# Patient Record
Sex: Female | Born: 1935 | Hispanic: No | Marital: Married | Smoking: Never smoker
Health system: Southern US, Community
[De-identification: ages and names within clinical notes are randomized; demographics above are authoritative.]

## PROBLEM LIST (undated history)

## (undated) DIAGNOSIS — F419 Anxiety disorder, unspecified: Secondary | ICD-10-CM

## (undated) DIAGNOSIS — E785 Hyperlipidemia, unspecified: Secondary | ICD-10-CM

## (undated) DIAGNOSIS — I2510 Atherosclerotic heart disease of native coronary artery without angina pectoris: Secondary | ICD-10-CM

## (undated) DIAGNOSIS — R9389 Abnormal findings on diagnostic imaging of other specified body structures: Secondary | ICD-10-CM

## (undated) DIAGNOSIS — E119 Type 2 diabetes mellitus without complications: Secondary | ICD-10-CM

## (undated) DIAGNOSIS — I10 Essential (primary) hypertension: Secondary | ICD-10-CM

## (undated) DIAGNOSIS — E039 Hypothyroidism, unspecified: Secondary | ICD-10-CM

## (undated) DIAGNOSIS — T783XXA Angioneurotic edema, initial encounter: Secondary | ICD-10-CM

---

## 1981-07-23 DIAGNOSIS — I10 Essential (primary) hypertension: Secondary | ICD-10-CM | POA: Insufficient documentation

## 1997-06-04 DIAGNOSIS — E119 Type 2 diabetes mellitus without complications: Secondary | ICD-10-CM | POA: Insufficient documentation

## 1998-03-09 ENCOUNTER — Ambulatory Visit (HOSPITAL_COMMUNITY): Admission: RE | Admit: 1998-03-09 | Discharge: 1998-03-09 | Payer: Self-pay | Admitting: Family Medicine

## 1999-01-02 ENCOUNTER — Other Ambulatory Visit: Admission: RE | Admit: 1999-01-02 | Discharge: 1999-01-02 | Payer: Self-pay | Admitting: Family Medicine

## 1999-01-17 ENCOUNTER — Ambulatory Visit (HOSPITAL_COMMUNITY): Admission: RE | Admit: 1999-01-17 | Discharge: 1999-01-17 | Payer: Self-pay | Admitting: Family Medicine

## 1999-01-17 ENCOUNTER — Encounter: Payer: Self-pay | Admitting: Family Medicine

## 1999-04-21 ENCOUNTER — Ambulatory Visit (HOSPITAL_COMMUNITY): Admission: RE | Admit: 1999-04-21 | Discharge: 1999-04-21 | Payer: Self-pay | Admitting: Family Medicine

## 1999-05-03 ENCOUNTER — Encounter: Payer: Self-pay | Admitting: Family Medicine

## 1999-05-03 ENCOUNTER — Ambulatory Visit (HOSPITAL_COMMUNITY): Admission: RE | Admit: 1999-05-03 | Discharge: 1999-05-03 | Payer: Self-pay | Admitting: Family Medicine

## 1999-07-19 ENCOUNTER — Ambulatory Visit (HOSPITAL_COMMUNITY): Admission: RE | Admit: 1999-07-19 | Discharge: 1999-07-19 | Payer: Self-pay | Admitting: Family Medicine

## 1999-07-19 ENCOUNTER — Encounter: Payer: Self-pay | Admitting: Family Medicine

## 2002-07-28 ENCOUNTER — Encounter: Admission: RE | Admit: 2002-07-28 | Discharge: 2002-10-26 | Payer: Self-pay | Admitting: Family Medicine

## 2003-08-26 ENCOUNTER — Ambulatory Visit (HOSPITAL_COMMUNITY): Admission: RE | Admit: 2003-08-26 | Discharge: 2003-08-26 | Payer: Self-pay | Admitting: Family Medicine

## 2004-11-15 ENCOUNTER — Ambulatory Visit: Payer: Self-pay | Admitting: Family Medicine

## 2004-12-19 ENCOUNTER — Ambulatory Visit: Payer: Self-pay | Admitting: Family Medicine

## 2004-12-27 ENCOUNTER — Ambulatory Visit (HOSPITAL_COMMUNITY): Admission: RE | Admit: 2004-12-27 | Discharge: 2004-12-27 | Payer: Self-pay | Admitting: Family Medicine

## 2005-01-31 ENCOUNTER — Ambulatory Visit: Payer: Self-pay | Admitting: Family Medicine

## 2005-01-31 ENCOUNTER — Other Ambulatory Visit: Admission: RE | Admit: 2005-01-31 | Discharge: 2005-01-31 | Payer: Self-pay | Admitting: Family Medicine

## 2005-04-10 ENCOUNTER — Ambulatory Visit: Payer: Self-pay | Admitting: Family Medicine

## 2005-05-28 ENCOUNTER — Ambulatory Visit: Payer: Self-pay | Admitting: Family Medicine

## 2006-04-30 ENCOUNTER — Ambulatory Visit: Payer: Self-pay | Admitting: Family Medicine

## 2006-05-15 ENCOUNTER — Ambulatory Visit (HOSPITAL_COMMUNITY): Admission: RE | Admit: 2006-05-15 | Discharge: 2006-05-15 | Payer: Self-pay | Admitting: Internal Medicine

## 2006-05-31 ENCOUNTER — Encounter: Admission: RE | Admit: 2006-05-31 | Discharge: 2006-05-31 | Payer: Self-pay | Admitting: Family Medicine

## 2006-08-05 ENCOUNTER — Ambulatory Visit: Payer: Self-pay | Admitting: Family Medicine

## 2006-08-05 DIAGNOSIS — E559 Vitamin D deficiency, unspecified: Secondary | ICD-10-CM | POA: Insufficient documentation

## 2006-10-15 ENCOUNTER — Ambulatory Visit: Payer: Self-pay | Admitting: Family Medicine

## 2006-11-13 ENCOUNTER — Ambulatory Visit: Payer: Self-pay | Admitting: Family Medicine

## 2007-02-26 ENCOUNTER — Ambulatory Visit: Payer: Self-pay | Admitting: Family Medicine

## 2007-02-27 ENCOUNTER — Encounter (INDEPENDENT_AMBULATORY_CARE_PROVIDER_SITE_OTHER): Payer: Self-pay | Admitting: Family Medicine

## 2007-02-27 LAB — CONVERTED CEMR LAB
LDL Cholesterol: 99 mg/dL (ref 0–99)
Total CHOL/HDL Ratio: 4.7
VLDL: 38 mg/dL (ref 0–40)

## 2007-03-03 DIAGNOSIS — F411 Generalized anxiety disorder: Secondary | ICD-10-CM | POA: Insufficient documentation

## 2007-03-03 DIAGNOSIS — M1990 Unspecified osteoarthritis, unspecified site: Secondary | ICD-10-CM | POA: Insufficient documentation

## 2007-03-03 DIAGNOSIS — E785 Hyperlipidemia, unspecified: Secondary | ICD-10-CM | POA: Insufficient documentation

## 2007-03-03 DIAGNOSIS — G4700 Insomnia, unspecified: Secondary | ICD-10-CM

## 2007-06-04 ENCOUNTER — Ambulatory Visit: Payer: Self-pay | Admitting: Family Medicine

## 2007-06-04 LAB — CONVERTED CEMR LAB: Hgb A1c MFr Bld: 7.7 %

## 2007-06-11 LAB — CONVERTED CEMR LAB
ALT: 14 units/L (ref 0–35)
AST: 18 units/L (ref 0–37)
Albumin: 3.9 g/dL (ref 3.5–5.2)
Alkaline Phosphatase: 67 units/L (ref 39–117)
BUN: 29 mg/dL — ABNORMAL HIGH (ref 6–23)
CO2: 25 meq/L (ref 19–32)
Chloride: 105 meq/L (ref 96–112)
Cholesterol: 144 mg/dL (ref 0–200)
Creatinine, Ser: 1.07 mg/dL (ref 0.40–1.20)
Glucose, Bld: 154 mg/dL — ABNORMAL HIGH (ref 70–99)
Total Bilirubin: 0.4 mg/dL (ref 0.3–1.2)
Total Protein: 7.5 g/dL (ref 6.0–8.3)
VLDL: 38 mg/dL (ref 0–40)

## 2007-06-24 ENCOUNTER — Ambulatory Visit (HOSPITAL_COMMUNITY): Admission: RE | Admit: 2007-06-24 | Discharge: 2007-06-24 | Payer: Self-pay | Admitting: Family Medicine

## 2007-11-20 ENCOUNTER — Telehealth (INDEPENDENT_AMBULATORY_CARE_PROVIDER_SITE_OTHER): Payer: Self-pay | Admitting: Family Medicine

## 2007-12-30 ENCOUNTER — Other Ambulatory Visit: Admission: RE | Admit: 2007-12-30 | Discharge: 2007-12-30 | Payer: Self-pay | Admitting: Family Medicine

## 2007-12-30 ENCOUNTER — Ambulatory Visit: Payer: Self-pay | Admitting: Family Medicine

## 2007-12-30 ENCOUNTER — Encounter (INDEPENDENT_AMBULATORY_CARE_PROVIDER_SITE_OTHER): Payer: Self-pay | Admitting: Family Medicine

## 2007-12-30 LAB — CONVERTED CEMR LAB
Bilirubin Urine: NEGATIVE
Blood in Urine, dipstick: NEGATIVE
Hgb A1c MFr Bld: 7.7 %
Ketones, urine, test strip: NEGATIVE
Nitrite: NEGATIVE
Protein, U semiquant: NEGATIVE
Urobilinogen, UA: 0.2
pH: 5

## 2007-12-31 LAB — CONVERTED CEMR LAB: Pap Smear: NORMAL

## 2008-01-05 DIAGNOSIS — E039 Hypothyroidism, unspecified: Secondary | ICD-10-CM | POA: Insufficient documentation

## 2008-01-06 ENCOUNTER — Encounter (INDEPENDENT_AMBULATORY_CARE_PROVIDER_SITE_OTHER): Payer: Self-pay | Admitting: Family Medicine

## 2008-02-12 ENCOUNTER — Encounter (INDEPENDENT_AMBULATORY_CARE_PROVIDER_SITE_OTHER): Payer: Self-pay | Admitting: *Deleted

## 2008-03-04 ENCOUNTER — Encounter (INDEPENDENT_AMBULATORY_CARE_PROVIDER_SITE_OTHER): Payer: Self-pay | Admitting: *Deleted

## 2008-03-04 LAB — CONVERTED CEMR LAB
Albumin: 3.9 g/dL (ref 3.5–5.2)
BUN: 28 mg/dL — ABNORMAL HIGH (ref 6–23)
Basophils Absolute: 0 10*3/uL (ref 0.0–0.1)
Basophils Relative: 0 % (ref 0–1)
CO2: 24 meq/L (ref 19–32)
Calcium: 10 mg/dL (ref 8.4–10.5)
Cholesterol: 123 mg/dL (ref 0–200)
Eosinophils Absolute: 0.1 10*3/uL (ref 0.0–0.7)
Eosinophils Relative: 1 % (ref 0–5)
HCT: 39.4 % (ref 36.0–46.0)
LDL Cholesterol: 49 mg/dL (ref 0–99)
Lymphocytes Relative: 31 % (ref 12–46)
Lymphs Abs: 2.8 10*3/uL (ref 0.7–4.0)
Microalb, Ur: 0.57 mg/dL (ref 0.00–1.89)
Monocytes Absolute: 0.8 10*3/uL (ref 0.1–1.0)
Monocytes Relative: 8 % (ref 3–12)
Potassium: 4.4 meq/L (ref 3.5–5.3)
RDW: 13.8 % (ref 11.5–15.5)
TSH: 2.837 microintl units/mL (ref 0.350–5.50)
Total Bilirubin: 0.4 mg/dL (ref 0.3–1.2)
Total Protein: 7.2 g/dL (ref 6.0–8.3)
Triglycerides: 193 mg/dL — ABNORMAL HIGH (ref <150)
VLDL: 39 mg/dL (ref 0–40)
WBC: 9.1 10*3/uL (ref 4.0–10.5)

## 2008-06-24 ENCOUNTER — Ambulatory Visit (HOSPITAL_COMMUNITY): Admission: RE | Admit: 2008-06-24 | Discharge: 2008-06-24 | Payer: Self-pay | Admitting: Family Medicine

## 2008-10-13 ENCOUNTER — Telehealth (INDEPENDENT_AMBULATORY_CARE_PROVIDER_SITE_OTHER): Payer: Self-pay | Admitting: *Deleted

## 2008-10-20 ENCOUNTER — Ambulatory Visit: Payer: Self-pay | Admitting: Family Medicine

## 2008-11-05 ENCOUNTER — Telehealth (INDEPENDENT_AMBULATORY_CARE_PROVIDER_SITE_OTHER): Payer: Self-pay | Admitting: Family Medicine

## 2008-11-05 LAB — CONVERTED CEMR LAB
AST: 17 units/L (ref 0–37)
Alkaline Phosphatase: 57 units/L (ref 39–117)
Calcium: 9.2 mg/dL (ref 8.4–10.5)
Glucose, Bld: 134 mg/dL — ABNORMAL HIGH (ref 70–99)
HDL: 35 mg/dL — ABNORMAL LOW (ref >39)
Hgb A1c MFr Bld: 7.4 % — ABNORMAL HIGH (ref 4.6–6.1)
LDL Cholesterol: 48 mg/dL (ref 0–99)
Sodium: 140 meq/L (ref 135–145)
TSH: 6.1 microintl units/mL — ABNORMAL HIGH (ref 0.350–4.500)
Total Protein: 7.2 g/dL (ref 6.0–8.3)
Triglycerides: 179 mg/dL — ABNORMAL HIGH (ref <150)
VLDL: 36 mg/dL (ref 0–40)

## 2008-11-15 ENCOUNTER — Ambulatory Visit: Payer: Self-pay | Admitting: Family Medicine

## 2009-02-11 ENCOUNTER — Ambulatory Visit: Payer: Self-pay | Admitting: Internal Medicine

## 2009-05-19 ENCOUNTER — Encounter: Payer: Self-pay | Admitting: Physician Assistant

## 2009-06-08 ENCOUNTER — Telehealth (INDEPENDENT_AMBULATORY_CARE_PROVIDER_SITE_OTHER): Payer: Self-pay | Admitting: Nurse Practitioner

## 2009-06-13 ENCOUNTER — Ambulatory Visit: Payer: Self-pay | Admitting: Physician Assistant

## 2009-06-13 LAB — CONVERTED CEMR LAB: LDL Goal: 100 mg/dL

## 2009-06-14 ENCOUNTER — Telehealth: Payer: Self-pay | Admitting: Physician Assistant

## 2009-06-14 LAB — CONVERTED CEMR LAB
AST: 18 units/L (ref 0–37)
Alkaline Phosphatase: 61 units/L (ref 39–117)
BUN: 32 mg/dL — ABNORMAL HIGH (ref 6–23)
Chloride: 104 meq/L (ref 96–112)
Creatinine, Ser: 1.08 mg/dL (ref 0.40–1.20)
Glucose, Bld: 179 mg/dL — ABNORMAL HIGH (ref 70–99)
Potassium: 4.6 meq/L (ref 3.5–5.3)
Sodium: 140 meq/L (ref 135–145)
TSH: 2.232 microintl units/mL (ref 0.350–4.500)
Total Bilirubin: 0.4 mg/dL (ref 0.3–1.2)
Total Protein: 7 g/dL (ref 6.0–8.3)

## 2009-06-28 ENCOUNTER — Ambulatory Visit (HOSPITAL_COMMUNITY): Admission: RE | Admit: 2009-06-28 | Discharge: 2009-06-28 | Payer: Self-pay | Admitting: Internal Medicine

## 2009-07-01 ENCOUNTER — Encounter: Payer: Self-pay | Admitting: Physician Assistant

## 2009-07-30 ENCOUNTER — Encounter: Payer: Self-pay | Admitting: Physician Assistant

## 2009-08-15 ENCOUNTER — Telehealth: Payer: Self-pay | Admitting: Physician Assistant

## 2009-09-13 ENCOUNTER — Ambulatory Visit: Payer: Self-pay | Admitting: Physician Assistant

## 2009-09-13 DIAGNOSIS — R011 Cardiac murmur, unspecified: Secondary | ICD-10-CM

## 2009-09-14 ENCOUNTER — Encounter: Payer: Self-pay | Admitting: Physician Assistant

## 2009-09-22 ENCOUNTER — Encounter: Payer: Self-pay | Admitting: Physician Assistant

## 2009-09-22 ENCOUNTER — Encounter (INDEPENDENT_AMBULATORY_CARE_PROVIDER_SITE_OTHER): Payer: Self-pay | Admitting: Internal Medicine

## 2009-09-22 ENCOUNTER — Ambulatory Visit: Admission: RE | Admit: 2009-09-22 | Discharge: 2009-09-22 | Payer: Self-pay | Admitting: Internal Medicine

## 2009-09-27 ENCOUNTER — Encounter: Payer: Self-pay | Admitting: Physician Assistant

## 2009-10-31 ENCOUNTER — Telehealth: Payer: Self-pay | Admitting: Physician Assistant

## 2010-02-22 ENCOUNTER — Telehealth: Payer: Self-pay | Admitting: Physician Assistant

## 2010-03-21 ENCOUNTER — Ambulatory Visit: Payer: Self-pay | Admitting: Physician Assistant

## 2010-03-21 LAB — CONVERTED CEMR LAB
Albumin: 3.9 g/dL (ref 3.5–5.2)
Blood Glucose, Fingerstick: 102
CO2: 26 meq/L (ref 19–32)
Chloride: 106 meq/L (ref 96–112)
Cholesterol: 112 mg/dL (ref 0–200)
Glucose, Bld: 107 mg/dL — ABNORMAL HIGH (ref 70–99)
HDL: 40 mg/dL (ref >39)
LDL Cholesterol: 52 mg/dL (ref 0–99)
TSH: 2.871 microintl units/mL (ref 0.350–4.500)
Total CHOL/HDL Ratio: 2.8
Total Protein: 6.9 g/dL (ref 6.0–8.3)
Triglycerides: 102 mg/dL (ref <150)
VLDL: 20 mg/dL (ref 0–40)
Vit D, 25-Hydroxy: 33 ng/mL (ref 30–89)

## 2010-03-23 ENCOUNTER — Encounter: Payer: Self-pay | Admitting: Physician Assistant

## 2010-04-12 ENCOUNTER — Telehealth: Payer: Self-pay | Admitting: Physician Assistant

## 2010-04-24 ENCOUNTER — Ambulatory Visit: Payer: Self-pay | Admitting: Internal Medicine

## 2010-04-24 ENCOUNTER — Encounter: Payer: Self-pay | Admitting: Physician Assistant

## 2010-04-24 DIAGNOSIS — R82998 Other abnormal findings in urine: Secondary | ICD-10-CM | POA: Insufficient documentation

## 2010-04-24 LAB — CONVERTED CEMR LAB
Glucose, Urine, Semiquant: NEGATIVE
Specific Gravity, Urine: 1.015

## 2010-04-25 ENCOUNTER — Encounter: Payer: Self-pay | Admitting: Physician Assistant

## 2010-04-25 LAB — CONVERTED CEMR LAB: Casts: NONE SEEN /lpf

## 2010-05-08 ENCOUNTER — Ambulatory Visit: Payer: Self-pay | Admitting: Physician Assistant

## 2010-05-09 ENCOUNTER — Encounter: Payer: Self-pay | Admitting: Physician Assistant

## 2010-05-09 LAB — CONVERTED CEMR LAB: Crystals: NONE SEEN

## 2010-05-15 ENCOUNTER — Ambulatory Visit: Payer: Self-pay | Admitting: Physician Assistant

## 2010-05-17 ENCOUNTER — Ambulatory Visit: Payer: Self-pay | Admitting: Internal Medicine

## 2010-05-19 LAB — CONVERTED CEMR LAB
Ketones, urine, test strip: NEGATIVE
Nitrite: NEGATIVE
Urobilinogen, UA: 0.2

## 2010-05-20 ENCOUNTER — Ambulatory Visit (HOSPITAL_COMMUNITY): Admission: RE | Admit: 2010-05-20 | Discharge: 2010-05-20 | Payer: Self-pay | Admitting: Nurse Practitioner

## 2010-06-23 ENCOUNTER — Encounter: Admission: RE | Admit: 2010-06-23 | Discharge: 2010-06-23 | Payer: Self-pay | Admitting: Infectious Diseases

## 2010-08-03 ENCOUNTER — Ambulatory Visit (HOSPITAL_COMMUNITY)
Admission: RE | Admit: 2010-08-03 | Discharge: 2010-08-03 | Payer: Self-pay | Source: Home / Self Care | Attending: Internal Medicine | Admitting: Internal Medicine

## 2010-08-13 ENCOUNTER — Encounter: Payer: Self-pay | Admitting: Occupational Therapy

## 2010-08-20 LAB — CONVERTED CEMR LAB
ALT: 13 units/L (ref 0–35)
AST: 21 units/L (ref 0–37)
Albumin: 3.9 g/dL (ref 3.5–5.2)
Alkaline Phosphatase: 54 units/L (ref 39–117)
Calcium: 9.2 mg/dL (ref 8.4–10.5)
Creatinine, Ser: 1.13 mg/dL (ref 0.40–1.20)
Glucose, Bld: 147 mg/dL — ABNORMAL HIGH (ref 70–99)
Potassium: 4.4 meq/L (ref 3.5–5.3)
Total Bilirubin: 0.5 mg/dL (ref 0.3–1.2)

## 2010-08-22 NOTE — Progress Notes (Signed)
Summary: Office Visit//DEPRESSION SCREENING  Office Visit//DEPRESSION SCREENING   Imported By: Arta Bruce 11/10/2009 10:37:27  _____________________________________________________________________  External Attachment:    Type:   Image     Comment:   External Document

## 2010-08-22 NOTE — Letter (Signed)
Summary: [facility]  [facility]   Imported By: Arta Bruce 09/12/2009 16:36:06  _____________________________________________________________________  External Attachment:    Type:   Image     Comment:   External Document

## 2010-08-22 NOTE — Progress Notes (Signed)
Summary: Query:  Refill Metoprolol?  Phone Note Outgoing Call   Summary of Call: Pt. has not had Metoprolol filled since Nov 2010 (per pharmacy).  Is this med still current?  Do you want it refilled? Initial call taken by: Dutch Quint RN,  April 12, 2010 3:38 PM  Follow-up for Phone Call        Please contact patient and find out what she is doing. Tereso Newcomer PA-C  April 12, 2010 5:34 PM  Left message with grandson for pt. to return call.  Dutch Quint RN  April 13, 2010 3:07 PM  Daughter-in-law says that she was taking it only once a day, and then she ran out.  Dutch Quint RN  April 14, 2010 11:25 AM   When did she run out? Marland Kitchen Tereso Newcomer PA-C  April 14, 2010 12:28 PM   I could not get an answer to that question.  Dutch Quint RN  April 14, 2010 3:33 PM   Additional Follow-up for Phone Call Additional follow up Details #1::        Have her come in for nurse visit for BP check. Bring all meds.  Check them and update her med list.  Additional Follow-up by: Brynda Rim,  April 18, 2010 11:17 AM    Additional Follow-up for Phone Call Additional follow up Details #2::    Left message on answering machine for pt. to return call.  Dutch Quint RN  April 18, 2010 12:38 PM  States she ran out of the meds a long time ago.  Appt. 04/24/10.  Instructed to bring all medication bottles with her to be checked.   Follow-up by: Dutch Quint RN,  April 20, 2010 5:29 PM  Additional Follow-up for Phone Call Additional follow up Details #3:: Details for Additional Follow-up Action Taken: Pt. in office. Additional Follow-up by: Dutch Quint RN,  April 24, 2010 11:38 AM

## 2010-08-22 NOTE — Progress Notes (Signed)
Summary: Query -- refill Crestor?  Phone Note Outgoing Call   Summary of Call: Do you want me to fill her Crestor?  She was here in 2/11 and her last labs were 3/10. Initial call taken by: Dutch Quint RN,  February 22, 2010 12:57 PM  Follow-up for Phone Call        In [place]'s basket to fax. Make sure she comes fasting to her next OV Follow-up by: Brynda Rim,  February 23, 2010 8:58 AM  Additional Follow-up for Phone Call Additional follow up Details #1::        Spoke with family member and pt., advised of refill and confirmed 03/21/10 appt. Additional Follow-up by: Dutch Quint RN,  February 23, 2010 10:12 AM    Prescriptions: CRESTOR 20 MG  TABS (ROSUVASTATIN CALCIUM) Take 1 tablet by mouth once a day  #30 x 5   Entered and Authorized by:   Tereso Newcomer PA-C   Signed by:   Tereso Newcomer PA-C on 02/23/2010   Method used:   Printed then faxed to ...       [facility] (retail)       [address]       [place], [place]  [address]       Ph: [phone] or [phone]       Fax: [phone]   RxID:   [id number]

## 2010-08-22 NOTE — Letter (Signed)
Summary: *HSN Results Follow up  [facility]  [address] [place], [place] [address]   Phone: [phone]  Fax: [phone]      09/27/2009   [facility] [address] [place], [place]  [address]   Dear  Ms. Jazel Rayon,                            ____S.Drinkard,FNP   ____D. Gore,FNP       ____B. McPherson,MD   ____V. Rankins,MD    ____E. Mulberry,MD    ____N. Daphine Deutscher, FNP  ____D. Reche Dixon, MD    ____K. Philipp Deputy, MD    __x__S. Alben Spittle, PA-C     This letter is to inform you that your recent test(s):  _______Pap Smear    _______Lab Test     ___x____Echocardiogram    ___x____ is within acceptable limits  _______ requires a medication change  _______ requires a follow-up lab visit  _______ requires a follow-up visit with your provider   Comments:  Heart function is normal.  No valve problems.       _________________________________________________________ If you have any questions, please contact our office                     Sincerely,  Tereso Newcomer PA-C [facility]

## 2010-08-22 NOTE — Miscellaneous (Signed)
  Clinical Lists Changes  Problems: Assessed DM, UNCOMPLICATED, TYPE II as comment only - Diabetic eye exam done 07/01/2009 no diabetic retinopathy  Her updated medication list for this problem includes:    Glucophage Xr 500 Mg Xr24h-tab (Metformin hcl) .Marland Kitchen... Take 2 tablets by mouth every morning    Diovan 160 Mg Tabs (Valsartan) .Marland Kitchen... 1 tablet by mouth daily    Bl Aspirin 325 Mg Tabs (Aspirin) .Marland Kitchen... 1 by mouth qday    Bayer Aspirin Ec Low Dose 81 Mg Tbec (Aspirin) .Marland Kitchen... Take one tablet every afternoon  Observations: Added new observation of PAST MED HX: Current Problems:  DM, UNCOMPLICATED, TYPE II (ICD-250.00) DEFICIENCY, VITAMIN D NOS (ICD-268.9) HYPERLIPIDEMIA (ICD-272.4) ANXIETY DISORDER, GENERALIZED (ICD-300.02) INSOMNIA, PERSISTENT (ICD-307.42) OSTEOARTHRITIS (ICD-715.90) HYPERTENSION, BENIGN ESSENTIAL (ICD-401.1) Glaucoma suspect   a.  followed by Dr. Genice Rouge. Dunn (ph # [phone]); monitor q 6 mos.   b.  N. S. cataracts OU   (07/30/2009 11:55) Added new observation of DIAB EYE EX: no diabetic retinopathy (Dr. London Sheer) (07/01/2009 11:55)       Past History:  Past Medical History: Current Problems:  DM, UNCOMPLICATED, TYPE II (ICD-250.00) DEFICIENCY, VITAMIN D NOS (ICD-268.9) HYPERLIPIDEMIA (ICD-272.4) ANXIETY DISORDER, GENERALIZED (ICD-300.02) INSOMNIA, PERSISTENT (ICD-307.42) OSTEOARTHRITIS (ICD-715.90) HYPERTENSION, BENIGN ESSENTIAL (ICD-401.1) Glaucoma suspect   a.  followed by Dr. Genice Rouge. Dunn (ph # [phone]); monitor q 6 mos.   b.  N. S. cataracts OU    Impression & Recommendations:  Problem # 1:  DM, UNCOMPLICATED, TYPE II (ICD-250.00) Diabetic eye exam done 07/01/2009 no diabetic retinopathy  Her updated medication list for this problem includes:    Glucophage Xr 500 Mg Xr24h-tab (Metformin hcl) .Marland Kitchen... Take 2 tablets by mouth every morning    Diovan 160 Mg Tabs (Valsartan) .Marland Kitchen... 1 tablet by mouth daily    Bl Aspirin 325 Mg Tabs (Aspirin) .Marland Kitchen...  1 by mouth qday    Bayer Aspirin Ec Low Dose 81 Mg Tbec (Aspirin) .Marland Kitchen... Take one tablet every afternoon  Complete Medication List: 1)  Glucophage Xr 500 Mg Xr24h-tab (Metformin hcl) .... Take 2 tablets by mouth every morning 2)  Verapamil Hcl Cr 240 Mg Cp24 (Verapamil hcl) .... One tablet by mouth daily 3)  Crestor 20 Mg Tabs (Rosuvastatin calcium) .... Take 1 tablet by mouth once a day 4)  Calcitriol 0.25 Mcg Caps (Calcitriol) .Marland Kitchen.. 1 by mouth qday 5)  Diovan 160 Mg Tabs (Valsartan) .Marland Kitchen.. 1 tablet by mouth daily 6)  Nexium 40 Mg Cpdr (Esomeprazole magnesium) .Marland Kitchen.. 1 by mouth qday 7)  Lorazepam 1 Mg Tabs (Lorazepam) .... One tablet by mouth every 8 hours as needed 8)  Bl Aspirin 325 Mg Tabs (Aspirin) .Marland Kitchen.. 1 by mouth qday 9)  Metoprolol Tartrate 50 Mg Tabs (Metoprolol tartrate) .Marland Kitchen.. 1 by mouth bid 10)  Maxzide-25 37.5-25 Mg Tabs (Triamterene-hctz) .Marland Kitchen.. 1 by mouth qam 11)  Bayer Aspirin Ec Low Dose 81 Mg Tbec (Aspirin) .... Take one tablet every afternoon 12)  Levothyroxine Sodium 50 Mcg Tabs (Levothyroxine sodium) .Marland Kitchen.. 1 by mouth daily

## 2010-08-22 NOTE — Progress Notes (Signed)
Summary: Refills  Phone Note Call from Patient Call back at [facility] Phone [phone]   Summary of Call: The pt needs more refills from crestor, calcitriol, and lorazepam medications.  [facility] [place]) Nikie Cid PA-C Initial call taken by: Manon Hilding,  October 31, 2009 10:27 AM  Follow-up for Phone Call        forward to provider Follow-up by: Arm[place] Shannon,  October 31, 2009 11:08 AM  Additional Follow-up for Phone Call Additional follow up Details #1::        Calcitriol changed to VIt. D Additional Follow-up by: Tereso Newcomer PA-C,  October 31, 2009 1:14 PM    Prescriptions: LORAZEPAM 1 MG  TABS (LORAZEPAM) One tablet by mouth every 8 hours as needed  #90 x 0   Entered by:   Armenia Shannon   Authorized by:   Tereso Newcomer PA-C   Signed by:   Armenia Shannon on 10/31/2009   Method used:   Reprint   RxID:   [id number] LORAZEPAM 1 MG  TABS (LORAZEPAM) One tablet by mouth every 8 hours as needed  #90 x 0   Entered by:   Armenia Shannon   Authorized by:   Tereso Newcomer PA-C   Signed by:   Armenia Shannon on 10/31/2009   Method used:   Printed then faxed to ...       [facility] (retail)       [address]       [place], [place]  [address]       Ph: [phone] or [phone]       Fax: [phone]   RxID:   [id number] CRESTOR 20 MG  TABS (ROSUVASTATIN CALCIUM) Take 1 tablet by mouth once a day  #30 x 3   Entered by:   Armenia Shannon   Authorized by:   Tereso Newcomer PA-C   Signed by:   Armenia Shannon on 10/31/2009   Method used:   Printed then faxed to ...       [facility] (retail)       [address]       [place], [place]  [address]       Ph: [phone] or [phone]       Fax: [phone]   RxID:   [id number]

## 2010-08-22 NOTE — Assessment & Plan Note (Signed)
Summary: dm, tHYROID//MM   Vital Signs:  Patient profile:   77 year old female Height:      61 inches Weight:      170.5 pounds Pulse rate:   72 / minute Pulse rhythm:   regular Resp:     18 per minute BP sitting:   108 / 72  (left arm) Cuff size:   large  Vitals Entered By: Michelle Nasuti (March 21, 2010 8:35 AM) CC: DM AnD HTN FOLLOW UP, Hypertension Management Is Patient Diabetic? Yes Pain Assessment Patient in pain? yes     Location: shoulder and knee Intensity: 2 CBG Result 102 CBG Device ID monA FASTING  Does patient need assistance? Functional Status Self care Ambulation Normal   Primary Care Provider:  Tereso Newcomer PA-C  CC:  DM AnD HTN FOLLOW UP and Hypertension Management.  History of Present Illness: DM:  Sugars usually less than 150.  Sees Dr. Shea Evans for eyes.  Feet are cold.  No sores on her feet.  HTN:  Has some feelings of nervousness in chest at times.  Has had for years.  Takes lorazepam.  No exertional chest pain, sob, radiating symptoms or syncope.  Arthritis:  Complains of shoulder and knee pain.  Uses advil.    Anxiety:  Still taking lorazepam.  Daughter states she takes every day.  Has been doing this for years.  Health maint: Never did stool cards Refused colo last time Never got balance screen   Hypertension History:      She complains of headache, but denies dyspnea with exertion and syncope.  She notes no problems with any antihypertensive medication side effects.        Positive major cardiovascular risk factors include female age 43 years old or older, diabetes, hyperlipidemia, hypertension, and family history for ischemic heart disease (males less than 79 years old).  Negative major cardiovascular risk factors include non-tobacco-user status.        Further assessment for target organ damage reveals no history of ASHD, stroke/TIA, or peripheral vascular disease.     Habits & Providers  Alcohol-Tobacco-Diet     Tobacco Status:  never  Current Medications (verified): 1)  Glucophage Xr 500 Mg Xr24h-Tab (Metformin Hcl) .... Take 2 Tablets By Mouth Every Morning 2)  Verapamil Hcl Cr 240 Mg  Cp24 (Verapamil Hcl) .... One Tablet By Mouth Daily 3)  Crestor 20 Mg  Tabs (Rosuvastatin Calcium) .... Take 1 Tablet By Mouth Once A Day 4)  Diovan 160 Mg  Tabs (Valsartan) .Marland Kitchen.. 1 Tablet By Mouth Daily 5)  Nexium 40 Mg  Cpdr (Esomeprazole Magnesium) .Marland Kitchen.. 1 By Mouth Qday 6)  Lorazepam 1 Mg  Tabs (Lorazepam) .... One Tablet By Mouth Every 8 Hours As Needed 7)  Bl Aspirin 325 Mg  Tabs (Aspirin) .Marland Kitchen.. 1 By Mouth Qday 8)  Metoprolol Tartrate 50 Mg  Tabs (Metoprolol Tartrate) .Marland Kitchen.. 1 By Mouth Bid 9)  Maxzide-25 37.5-25 Mg  Tabs (Triamterene-Hctz) .Marland Kitchen.. 1 By Mouth Qam 10)  Bayer Aspirin Ec Low Dose 81 Mg  Tbec (Aspirin) .... Take One Tablet Every Afternoon 11)  Levothyroxine Sodium 50 Mcg  Tabs (Levothyroxine Sodium) .Marland Kitchen.. 1 By Mouth Daily 12)  Vitamin D 1000 Unit Tabs (Cholecalciferol) .... Take 1 Tablet By Mouth Once A Day 13)  Calcium 600 Mg Tabs (Calcium) .... 2 By Mouth Once Daily  Allergies (verified): No Known Drug Allergies  Physical Exam  General:  alert, well-developed, and well-nourished.   Head:  normocephalic and atraumatic.  Eyes:  pupils equal, pupils round, and pupils reactive to light.   Neck:  supple and no carotid bruits.   Lungs:  normal breath sounds.   Heart:  normal rate and regular rhythm.   Abdomen:  soft.   Extremities:  no edema  Neurologic:  alert & oriented X3 and cranial nerves II-XII intact.   Psych:  normally interactive.    Diabetes Management Exam:    Foot Exam (with socks and/or shoes not present):       Inspection:          Left foot: normal          Right foot: normal   Impression & Recommendations:  Problem # 1:  HYPOTHYROIDISM (ICD-244.9)  Her updated medication list for this problem includes:    Levothyroxine Sodium 50 Mcg Tabs (Levothyroxine sodium) .Marland Kitchen... 1 by mouth  daily  Orders: T-TSH ([address]) T-T4, Free [phone])  Problem # 2:  EXAMINATION, ROUTINE MEDICAL (ICD-V70.0) encouraged her to do balance screen  Orders: T-Hemoccult Cards-Multiple (82270)  Problem # 3:  DM, UNCOMPLICATED, TYPE II (ICD-250.00) check A1C microalbumin arrange eye dr appt  Her updated medication list for this problem includes:    Glucophage Xr 500 Mg Xr24h-tab (Metformin hcl) .Marland Kitchen... Take 2 tablets by mouth every morning    Diovan 160 Mg Tabs (Valsartan) .Marland Kitchen... 1 tablet by mouth daily    Bl Aspirin 325 Mg Tabs (Aspirin) .Marland Kitchen... 1 by mouth qday    Bayer Aspirin Ec Low Dose 81 Mg Tbec (Aspirin) .Marland Kitchen... Take one tablet every afternoon  Orders: Capillary Blood Glucose/CBG (82948) T- Hemoglobin A1C (29[address]-13086) T-Urinalysis (57846-96295) T-Urine Microalbumin w/creat. ratio [phone])  Problem # 4:  DEFICIENCY, VITAMIN D NOS (ICD-268.9)  Orders: T-Vitamin D (25-Hydroxy) ([address]64-40347)  Problem # 5:  ANXIETY DISORDER, GENERALIZED (ICD-300.02) past attempts at d/c lorazepam unsuccessful  Her updated medication list for this problem includes:    Lorazepam 1 Mg Tabs (Lorazepam) ..... One tablet by mouth every 8 hours as needed  Problem # 6:  OSTEOARTHRITIS (ICD-715.90) has pain in shoulders and knees stable over years d/w pt tylenol and ibuprofen  Her updated medication list for this problem includes:    Bl Aspirin 325 Mg Tabs (Aspirin) .Marland Kitchen... 1 by mouth qday    Bayer Aspirin Ec Low Dose 81 Mg Tbec (Aspirin) .Marland Kitchen... Take one tablet every afternoon  Problem # 7:  HYPERTENSION, BENIGN ESSENTIAL (ICD-401.1) stable  Her updated medication list for this problem includes:    Verapamil Hcl Cr 240 Mg Cp24 (Verapamil hcl) ..... One tablet by mouth daily    Diovan 160 Mg Tabs (Valsartan) .Marland Kitchen... 1 tablet by mouth daily    Metoprolol Tartrate 50 Mg Tabs (Metoprolol tartrate) .Marland Kitchen... 1 by mouth bid    Maxzide-25 37.5-25 Mg Tabs (Triamterene-hctz) .Marland Kitchen... 1 by mouth  qam  Orders: T-Comprehensive Metabolic Panel ([id number]) T-Urinalysis (64332-95188) T-Urine Microalbumin w/creat. ratio [phone])  Complete Medication List: 1)  Glucophage Xr 500 Mg Xr24h-tab (Metformin hcl) .... Take 2 tablets by mouth every morning 2)  Verapamil Hcl Cr 240 Mg Cp24 (Verapamil hcl) .... One tablet by mouth daily 3)  Crestor 20 Mg Tabs (Rosuvastatin calcium) .... Take 1 tablet by mouth once a day 4)  Diovan 160 Mg Tabs (Valsartan) .Marland Kitchen.. 1 tablet by mouth daily 5)  Nexium 40 Mg Cpdr (Esomeprazole magnesium) .Marland Kitchen.. 1 by mouth qday 6)  Lorazepam 1 Mg Tabs (Lorazepam) .... One tablet by mouth every 8 hours as needed 7)  Bl Aspirin 325 Mg Tabs (  Aspirin) .Marland Kitchen.. 1 by mouth qday 8)  Metoprolol Tartrate 50 Mg Tabs (Metoprolol tartrate) .Marland Kitchen.. 1 by mouth bid 9)  Maxzide-25 37.5-25 Mg Tabs (Triamterene-hctz) .Marland Kitchen.. 1 by mouth qam 10)  Bayer Aspirin Ec Low Dose 81 Mg Tbec (Aspirin) .... Take one tablet every afternoon 11)  Levothyroxine Sodium 50 Mcg Tabs (Levothyroxine sodium) .Marland Kitchen.. 1 by mouth daily 12)  Vitamin D 1000 Unit Tabs (Cholecalciferol) .... Take 1 tablet by mouth once a day 13)  Calcium 600 Mg Tabs (Calcium) .... 2 by mouth once daily  Other Orders: T-Lipid Profile ([address])  Hypertension Assessment/Plan:      The patient's hypertensive risk group is category C: Target organ damage and/or diabetes.  Her calculated 10 year risk of coronary heart disease is 11 %.  Today's blood pressure is 108/72.  Her blood pressure goal is < 130/80.  Patient Instructions: 1)  Schedule follow up with Dr. Shea Evans for your eyes in December 2011. 2)  Please call [phone] to schedule a FREE balance screen.  This is with [facility] on [address] 3)  Complete your hemoccult cards and return them soon.  4)  Take 650 - 1000 mg of tylenol every 6 hours as needed for relief of pain or comfort of fever. Avoid taking more than 4000 mg in a 24 hour period( can cause liver damage in higher  doses).  5)  Take 400-600 mg of Ibuprofen (Advil, Motrin) with food every 4-6 hours as needed  for relief of pain or comfort of fever.  6)  Please schedule a follow-up appointment in 4 months with Scott for diabetes and blood pressure.     Last LDL:                                                 48 (10/20/2008 10:11:00 PM)

## 2010-08-22 NOTE — Letter (Signed)
Summary: ECHO  ECHO   Imported By: Arta Bruce 09/29/2009 10:34:56  _____________________________________________________________________  External Attachment:    Type:   Image     Comment:   External Document

## 2010-08-22 NOTE — Letter (Signed)
Summary: TEST ORDER FORM/MAMMOGRAM//APPT DATE & TIME  TEST ORDER FORM/MAMMOGRAM//APPT DATE & TIME   Imported By: Arta Bruce 08/08/2009 16:20:17  _____________________________________________________________________  External Attachment:    Type:   Image     Comment:   External Document

## 2010-08-22 NOTE — Assessment & Plan Note (Signed)
Summary: BP and medication check // tl  Nurse Visit   Vital Signs:  Patient profile:   77 year old female Pulse rate:   64 / minute Pulse rhythm:   regular Resp:     20 per minute BP sitting:   112 / 68  (right arm) Cuff size:   large  Vitals Entered By: Dutch Quint RN (April 24, 2010 10:22 AM)  Patient Instructions: 1)  Reviewed with Jo Larson 2)  Your blood pressure is good -- 112/68. 3)  We're stopping the metoprolol -- you don't have to take that right now. 4)  Make sure you pick up the calcium and take all medications as ordered. 5)  We will notify you of the results of your urine test. 6)  Make appointment to follow-up with provider in 3-4 months. 7)  Call if anything changes or if you have any questions.   Primary Care Provider:  Tereso Newcomer PA-C  CC:  BP and medication check..  History of Present Illness: Here for BP check and to check medications.  Wanted refill of metoprolol, but states she ran out a long time ago.  Last visit, 03/21/10 BP was 108/72.  Had heaches and sinus pressure consistently.  States took all meds, except metoprolol and calcium.  Medications confirmed.   Review of Systems CV:  Complains of fatigue, leg cramps with exertion, palpitations, and swelling of feet; denies bluish discoloration of lips or nails, chest pain or discomfort, difficulty breathing at night, difficulty breathing while lying down, fainting, lightheadness, near fainting, shortness of breath with exertion, swelling of hands, and weight gain; just feels dizziness when she has anxety.  Has occasional leg cramping and swelling of feet.Marland Kitchen   Physical Exam  Lungs:  normal respiratory effort, normal breath sounds, no crackles, and no wheezes.   Heart:  normal rate and regular rhythm.     Impression & Recommendations:  Problem # 1:  HYPERTENSION, BENIGN ESSENTIAL (ICD-401.1) BP good  112/68 Stop metoprolol Needs to take calcium F/u appt. 3-4 months with provider   Her  updated medication list for this problem includes:    Verapamil Hcl Cr 240 Mg Cp24 (Verapamil hcl) ..... One tablet by mouth daily    Diovan 160 Mg Tabs (Valsartan) .Marland Kitchen... 1 tablet by mouth daily    Metoprolol Tartrate 50 Mg Tabs (Metoprolol tartrate) .Marland Kitchen... 1 by mouth bid    Maxzide-25 37.5-25 Mg Tabs (Triamterene-hctz) .Marland Kitchen... 1 by mouth qam  Orders: Est. Patient Level I (25366)  Problem # 2:  URINALYSIS, ABNORMAL (ICD-791.9) Asymptomatic, urinalysis showed small leuks. Urine sent for culture and micro.   Orders: UA Dipstick w/o Micro (automated)  (81003) T-Culture, Urine ([id number]) T-Urine Microscopic ([id number])  Complete Medication List: 1)  Glucophage Xr 500 Mg Xr24h-tab (Metformin hcl) .... Take 2 tablets by mouth every morning 2)  Verapamil Hcl Cr 240 Mg Cp24 (Verapamil hcl) .... One tablet by mouth daily 3)  Crestor 20 Mg Tabs (Rosuvastatin calcium) .... Take 1 tablet by mouth once a day 4)  Diovan 160 Mg Tabs (Valsartan) .Marland Kitchen.. 1 tablet by mouth daily 5)  Nexium 40 Mg Cpdr (Esomeprazole magnesium) .Marland Kitchen.. 1 by mouth qday 6)  Lorazepam 1 Mg Tabs (Lorazepam) .... One tablet by mouth every 8 hours as needed 7)  Bl Aspirin 325 Mg Tabs (Aspirin) .Marland Kitchen.. 1 by mouth qday 8)  Metoprolol Tartrate 50 Mg Tabs (Metoprolol tartrate) .Marland Kitchen.. 1 by mouth bid 9)  Maxzide-25 37.5-25 Mg Tabs (Triamterene-hctz) .Marland Kitchen.. 1 by mouth  qam 10)  Bayer Aspirin Ec Low Dose 81 Mg Tbec (Aspirin) .... Take one tablet every afternoon 11)  Levothyroxine Sodium 50 Mcg Tabs (Levothyroxine sodium) .Marland Kitchen.. 1 by mouth daily 12)  Vitamin D 1000 Unit Tabs (Cholecalciferol) .... Take 1 tablet by mouth once a day 13)  Calcium 600 Mg Tabs (Calcium) .... 2 by mouth once daily  CC: BP and medication check. Is Patient Diabetic? Yes Did you bring your meter with you today? No Pain Assessment Patient in pain? yes     Location: head, sinus Intensity: 10 Type: aching  Does patient need assistance? Functional Status Self  care Ambulation Normal   Allergies: No Known Drug Allergies Laboratory Results   Urine Tests  Date/Time Received: April 24, 2010 11:28 AM   Routine Urinalysis   Color: lt. yellow Glucose: negative   (Normal Range: Negative) Bilirubin: negative   (Normal Range: Negative) Ketone: negative   (Normal Range: Negative) Spec. Gravity: 1.015   (Normal Range: 1.003-1.035) Blood: negative   (Normal Range: Negative) pH: 5.0   (Normal Range: 5.0-8.0) Protein: negative   (Normal Range: Negative) Urobilinogen: 0.2   (Normal Range: 0-1) Nitrite: negative   (Normal Range: Negative) Leukocyte Esterace: small   (Normal Range: Negative)       Orders Added: 1)  Est. Patient Level I [[address]] 2)  UA Dipstick w/o Micro (automated)  [81003] 3)  T-Culture, Urine [[id number]] 4)  T-Urine Microscopic [[id number]]

## 2010-08-22 NOTE — Letter (Signed)
Summary: *HSN Results Follow up  [facility]  [address] [place], [place] [address]   Phone: [phone]  Fax: [phone]      03/23/2010   [facility] [address] [place], [place]  [address]   Dear  Ms. Aneli Torpey,                            ____S.Drinkard,FNP   ____D. Gore,FNP       ____B. McPherson,MD   ____V. Rankins,MD    ____E. Mulberry,MD    ____N. Daphine Deutscher, FNP  ____D. Reche Dixon, MD    ____K. Philipp Deputy, MD    __x__S. Alben Spittle, PA-C    This letter is to inform you that your recent test(s):  _______Pap Smear    ___x____Lab Test     _______X-ray    ___x____ is within acceptable limits  _______ requires a medication change  _______ requires a follow-up lab visit  _______ requires a follow-up visit with your Laelynn Blizzard   Comments: Liver, kidney function, thyroid function ok.  Cholesterol numbers good.  Vitamin D level is good.  Continue all your same medications.       _________________________________________________________ If you have any questions, please contact our office                     Sincerely,  Tereso Newcomer PA-C [facility]

## 2010-08-22 NOTE — Progress Notes (Signed)
  Phone Note Outgoing Call   Summary of Call: I reviewed the patient's meds with Dr. Reche Dixon. We do not think she needs to be on the calcitriol. She can stop this and start Vit. D 1000 international units once daily. She should also be on Calcium 600 mg two times a day. What pharmacy? Initial call taken by: Brynda Rim,  August 15, 2009 10:09 AM  Follow-up for Phone Call        call back around 3 to speak to daughter.[place] Shannon  August 15, 2009 10:46 AM  tried calling pt but she could not understand me..... [place] Shannon  August 22, 2009 11:31 AM   Additional Follow-up for Phone Call Additional follow up Details #1::        spoke with daughter and she said [facility] [address] and she will make sure pt is taking calcium twice daily Additional Follow-up by: [place] Shannon,  August 22, 2009 3:28 PM    New/Updated Medications: VITAMIN D 1000 UNIT TABS (CHOLECALCIFEROL) Take 1 tablet by mouth once a day Prescriptions: VITAMIN D 1000 UNIT TABS (CHOLECALCIFEROL) Take 1 tablet by mouth once a day  #30 x 11   Entered and Authorized by:   Tereso Newcomer PA-C   Signed by:   Tereso Newcomer PA-C on 08/22/2009   Method used:   Electronically to        [id number]       [address] [place], [place]  [address]       Ph: [phone]       Fax: [phone]   RxID:   [id number]

## 2010-08-22 NOTE — Assessment & Plan Note (Signed)
Summary: CPP////KT   Vital Signs:  Patient profile:   77 year old female Height:      61 inches Weight:      179 pounds BMI:     33.94 Temp:     98.6 degrees F oral Pulse rate:   72 / minute Pulse rhythm:   regular Resp:     18 per minute BP sitting:   132 / 70  (left arm) Cuff size:   large  Vitals Entered By: [place] Shannon (September 13, 2009 8:44 AM) CC: Hypertension Management Is Patient Diabetic? Yes Pain Assessment Patient in pain? no      CBG Result 195  Does patient need assistance? Functional Status Self care Ambulation Normal   Primary Care Provider:  [facility]  CC:  Hypertension Management.  History of Present Illness: Here for CPE. Had pap 2009 that was normal.  Does not need paps anymore. No vaginal bleeding, burning, itching or odor. Mammo in Dec. was normal. No FHx of breast or ovarian cancer. Thinks she had colo . . . more than 10 years. PHQ9=3 today.   Anxiety:  Still has to take lorazepam.  Takes once a day usually to help her sleep.  Still anxious due to experiences in [place].  Does not want to go to counseling due to transportation issues.   DM:  Sugars at home 130-150. No hypoglycemic episodes.  Saw eye doctor in Dec.  Does not see foot doctor.  HTN:  Taking meds.  High chol:  Taking crestor.  Ate this morning so cannot check lipids.   Hypertension History:      She denies headache, chest pain, dyspnea with exertion, and syncope.  Further comments include: takes bp meds at 2:00 .        Positive major cardiovascular risk factors include female age 93 years old or older, diabetes, hyperlipidemia, hypertension, and family history for ischemic heart disease (males less than 16 years old).  Negative major cardiovascular risk factors include non-tobacco-user status.        Further assessment for target organ damage reveals no history of ASHD, stroke/TIA, or peripheral vascular disease.     Habits & Providers  Alcohol-Tobacco-Diet     Alcohol  drinks/day: 0     Tobacco Status: never  Exercise-Depression-Behavior     Drug Use: never  Problems Prior to Update: 1)  Systolic Murmur  (ICD-785.2) 2)  Hypothyroidism  (ICD-244.9) 3)  Screening For Malignant Neoplasm, Cervix  (ICD-V76.2) 4)  Examination, Routine Medical  (ICD-V70.0) 5)  Thyroid Stimulating Hormone, Abnormal  (ICD-246.9) 6)  Dm, Uncomplicated, Type II  (ICD-250.00) 7)  Deficiency, Vitamin D Nos  (ICD-268.9) 8)  Hyperlipidemia  (ICD-272.4) 9)  Anxiety Disorder, Generalized  (ICD-300.02) 10)  Insomnia, Persistent  (ICD-307.42) 11)  Osteoarthritis  (ICD-715.90) 12)  Hypertension, Benign Essential  (ICD-401.1)  Allergies: No Known Drug Allergies  Past History:  Past Medical History: Last updated: 07/30/2009 Current Problems:  DM, UNCOMPLICATED, TYPE II (ICD-250.00) DEFICIENCY, VITAMIN D NOS (ICD-268.9) HYPERLIPIDEMIA (ICD-272.4) ANXIETY DISORDER, GENERALIZED (ICD-300.02) INSOMNIA, PERSISTENT (ICD-307.42) OSTEOARTHRITIS (ICD-715.90) HYPERTENSION, BENIGN ESSENTIAL (ICD-401.1) Glaucoma suspect   a.  followed by Dr. Genice Rouge. Dunn (ph # [phone]); monitor q 6 mos.   b.  N. S. cataracts OU   Family History: unremarkable  Social History: Never Smoked Alcohol use-no lives with son and daughter in law from [place] does not speak EnglishDrug Use:  never  Review of Systems       The patient complains of difficulty walking.  The patient denies fever, weight loss, chest pain, syncope, dyspnea on exertion, headaches, melena, hematochezia, severe indigestion/heartburn, hematuria, and depression.         denies any falls   Physical Exam  General:  alert, well-developed, and well-nourished.   Head:  normocephalic and atraumatic.   Eyes:  pupils equal, pupils round, and pupils reactive to light.   Ears:  R ear normal and L ear normal.   Nose:  no external deformity.   Mouth:  pharynx pink and moist, no erythema, and no exudates.   Neck:  supple, no  thyromegaly, no carotid bruits, and no cervical lymphadenopathy.   Breasts:  skin/areolae normal, no masses, no abnormal thickening, no nipple discharge, no tenderness, and no adenopathy.   Lungs:  normal breath sounds, no crackles, and no wheezes.   Heart:  normal rate and regular rhythm.   2/6 sys murmur at RUSB  Abdomen:  soft, non-tender, and no hepatomegaly.   Rectal:  deferred Genitalia:  deferred Msk:  normal ROM.   Pulses:  DP/PT 2+ bilat Extremities:  no edema Neurologic:  get up and go:  patient a little slow but no swaying or loss of balancealert & oriented X3, cranial nerves II-XII intact, strength normal in all extremities, and DTRs symmetrical and normal.   Skin:  turgor normal.   Psych:  normally interactive and flat affect.     Impression & Recommendations:  Problem # 1:  EXAMINATION, ROUTINE MEDICAL (ICD-V70.0)  discussed colo with daughter in law . . . patient does not want to do test will give her stool cards. recommend she go to free balance screening with [facility] as her gait is slightly abnormal (may not need formal treatment) mammo done vaccines up to date   Orders: Hemoccult Cards -3 specimans (take home) (11914) T-Urinalysis ([id number])  Problem # 2:  HYPOTHYROIDISM (ICD-244.9) TSH in 05/2009 ok recheck at next appt  Her updated medication list for this problem includes:    Levothyroxine Sodium 50 Mcg Tabs (Levothyroxine sodium) .Marland Kitchen... 1 by mouth daily  Problem # 3:  DM, UNCOMPLICATED, TYPE II (ICD-250.00) A1C 7.4 today advised she watch her diet and increase activity  Her updated medication list for this problem includes:    Glucophage Xr 500 Mg Xr24h-tab (Metformin hcl) .Marland Kitchen... Take 2 tablets by mouth every morning    Diovan 160 Mg Tabs (Valsartan) .Marland Kitchen... 1 tablet by mouth daily    Bl Aspirin 325 Mg Tabs (Aspirin) .Marland Kitchen... 1 by mouth qday    Bayer Aspirin Ec Low Dose 81 Mg Tbec (Aspirin) .Marland Kitchen... Take one tablet every  afternoon  Orders: T-Urine Microalbumin w/creat. ratio [phone]) T- Hemoglobin A1C (41324-40102)  Problem # 4:  HYPERTENSION, BENIGN ESSENTIAL (ICD-401.1) fairly well controlled usually runs lower  needs ECG in chart  Her updated medication list for this problem includes:    Verapamil Hcl Cr 240 Mg Cp24 (Verapamil hcl) ..... One tablet by mouth daily    Diovan 160 Mg Tabs (Valsartan) .Marland Kitchen... 1 tablet by mouth daily    Metoprolol Tartrate 50 Mg Tabs (Metoprolol tartrate) .Marland Kitchen... 1 by mouth bid    Maxzide-25 37.5-25 Mg Tabs (Triamterene-hctz) .Marland Kitchen... 1 by mouth qam  Orders: T-Comprehensive Metabolic Panel ([id number]) T-Urinalysis (47425-95638) EKG w/ Interpretation (93000)  Problem # 5:  DEFICIENCY, VITAMIN D NOS (ICD-268.9) changed calcitriol to vitamin D check again at f/u visit  Problem # 6:  HYPERLIPIDEMIA (ICD-272.4) not fasting get lipids at f/u  Her updated medication list for this  problem includes:    Crestor 20 Mg Tabs (Rosuvastatin calcium) .Marland Kitchen... Take 1 tablet by mouth once a day  Orders: T-Comprehensive Metabolic Panel ([id number])  Problem # 7:  ANXIETY DISORDER, GENERALIZED (ICD-300.02) unable to come off lorazepam likely has PTSD unable to make it to appts with LCSW  Her updated medication list for this problem includes:    Lorazepam 1 Mg Tabs (Lorazepam) ..... One tablet by mouth every 8 hours as needed  Problem # 8:  SYSTOLIC MURMUR (UJW-119.2) probably aortic sclerosis check echo to assess  Orders: 2 D Echo (2 D Echo)  Complete Medication List: 1)  Glucophage Xr 500 Mg Xr24h-tab (Metformin hcl) .... Take 2 tablets by mouth every morning 2)  Verapamil Hcl Cr 240 Mg Cp24 (Verapamil hcl) .... One tablet by mouth daily 3)  Crestor 20 Mg Tabs (Rosuvastatin calcium) .... Take 1 tablet by mouth once a day 4)  Diovan 160 Mg Tabs (Valsartan) .Marland Kitchen.. 1 tablet by mouth daily 5)  Nexium 40 Mg Cpdr (Esomeprazole magnesium) .Marland Kitchen.. 1 by mouth qday 6)   Lorazepam 1 Mg Tabs (Lorazepam) .... One tablet by mouth every 8 hours as needed 7)  Bl Aspirin 325 Mg Tabs (Aspirin) .Marland Kitchen.. 1 by mouth qday 8)  Metoprolol Tartrate 50 Mg Tabs (Metoprolol tartrate) .Marland Kitchen.. 1 by mouth bid 9)  Maxzide-25 37.5-25 Mg Tabs (Triamterene-hctz) .Marland Kitchen.. 1 by mouth qam 10)  Bayer Aspirin Ec Low Dose 81 Mg Tbec (Aspirin) .... Take one tablet every afternoon 11)  Levothyroxine Sodium 50 Mcg Tabs (Levothyroxine sodium) .Marland Kitchen.. 1 by mouth daily 12)  Vitamin D 1000 Unit Tabs (Cholecalciferol) .... Take 1 tablet by mouth once a day 13)  Calcium 600 Mg Tabs (Calcium) .... 2 by mouth once daily  Hypertension Assessment/Plan:      The patient's hypertensive risk group is category C: Target organ damage and/or diabetes.  Her calculated 10 year risk of coronary heart disease is 17 %.  Today's blood pressure is 132/70.  Her blood pressure goal is < 130/80.  Patient Instructions: 1)  Come fasting to your next appointment (nothing to eat or drink after midnight the night before except water). 2)  Please go to Free Balance Screen with [facility].  [place] will give you the information.  If they think you need more, they will set it up.  This is to check you to see if you are at risk to fall. 3)  It is important that you exercise reguarly at least 20 minutes 5 times a week. If you develop chest pain, have severe difficulty breathing, or feel very tired, stop exercising immediately and seek medical attention.  4)  Try to stick to a diabetic diet.  If you would like to see our dietician, let me know. 5)  Please schedule a follow-up appointment in 3 months with Lenin Kuhnle for Diabetes and cholesterol.    EKG  Procedure date:  09/13/2009  Findings:      Sinus bradycardia with rate of:  54 normal axis PRWP nonspecific ST-TW changes    Appended Document: CPP////KT    Clinical Lists Changes  Observations: Added new observation of HGBA1C: 7.4 % (09/14/2009  8:15)      Laboratory Results   Blood Tests     HGBA1C: 7.4%   (Normal Range: Non-Diabetic - 3-6%   Control Diabetic - 6-8%)

## 2010-08-22 NOTE — Letter (Signed)
Summary: *HSN Results Follow up  [facility]  [address] [place], [place] [address]   Phone: [phone]  Fax: [phone]      09/14/2009   [facility] [address] [place], [place]  [address]   Dear  [name],                            ____S.Drinkard,FNP   ____D. Gore,FNP       ____B. McPherson,MD   ____V. Rankins,MD    ____E. Mulberry,MD    ____N. Daphine Deutscher, FNP  ____D. Reche Dixon, MD    ____K. Philipp Deputy, MD    __x__S. Alben Spittle, PA-C     This letter is to inform you that your recent test(s):  _______Pap Smear    ___x____Lab Test     _______X-ray    ___x____ is within acceptable limits  _______ requires a medication change  _______ requires a follow-up lab visit  _______ requires a follow-up visit with your provider   Comments:       _________________________________________________________ If you have any questions, please contact our office                     Sincerely,  Tereso Newcomer PA-C [facility]

## 2010-08-22 NOTE — Letter (Signed)
Summary: TRANSTHORACIC ECHO  TRANSTHORACIC ECHO   Imported By: Arta Bruce 11/28/2009 15:16:52  _____________________________________________________________________  External Attachment:    Type:   Image     Comment:   External Document

## 2010-08-22 NOTE — Letter (Signed)
Summary: REFERRAL//ORTHOPEDIC//APPT DATE & TIME  REFERRAL//ORTHOPEDIC//APPT DATE & TIME   Imported By: Arta Bruce 08/08/2009 16:28:03  _____________________________________________________________________  External Attachment:    Type:   Image     Comment:   External Document

## 2010-08-22 NOTE — Letter (Signed)
Summary: TRANSTHORACIC ECHOCARDIOGRAPY  TRANSTHORACIC ECHOCARDIOGRAPY   Imported By: Arta Bruce 11/29/2009 15:43:07  _____________________________________________________________________  External Attachment:    Type:   Image     Comment:   External Document

## 2010-08-29 ENCOUNTER — Telehealth (INDEPENDENT_AMBULATORY_CARE_PROVIDER_SITE_OTHER): Payer: Self-pay | Admitting: Nurse Practitioner

## 2010-08-31 ENCOUNTER — Encounter (INDEPENDENT_AMBULATORY_CARE_PROVIDER_SITE_OTHER): Payer: Self-pay | Admitting: Internal Medicine

## 2010-08-31 ENCOUNTER — Encounter: Payer: Self-pay | Admitting: Internal Medicine

## 2010-08-31 ENCOUNTER — Other Ambulatory Visit (HOSPITAL_COMMUNITY): Payer: Self-pay | Admitting: Internal Medicine

## 2010-08-31 LAB — CONVERTED CEMR LAB: Blood Glucose, Fingerstick: 170

## 2010-09-07 NOTE — Progress Notes (Signed)
Summary: D/C metoprolol from med list?  Phone Note Outgoing Call   Summary of Call: Last seen 04/24/10 for BP recheck and med review -- S. Weaver held metoprolol at that time, don't see it restarted.  Received request to refill, not filled recently.  D/C from pt.'s med list? Initial call taken by: Dutch Quint RN,  August 29, 2010 5:16 PM  Follow-up for Phone Call        You are right, thanks for researching. Do not refill. I removed it from the med list Follow-up by: Lehman Prom FNP,  August 30, 2010 8:10 AM  Additional Follow-up for Phone Call Additional follow up Details #1::        Noted.  Pharmacy notified.  Dutch Quint RN  August 30, 2010 12:57 PM

## 2010-09-07 NOTE — Assessment & Plan Note (Signed)
Summary: F/u from august for DM and BP   Vital Signs:  Patient profile:   77 year old female Menstrual status:  postmenopausal Weight:      180 pounds BMI:     34.13 Temp:     97.4 degrees F oral Pulse rate:   70 / minute Pulse rhythm:   regular Resp:     24 per minute BP sitting:   124 / 70  (left arm) Cuff size:   regular  Vitals Entered By: Hale Drone CMA (August 31, 2010 10:22 AM) CC: F/u from August for DM and BP. Needs refill on Levothyroxine, Vitamin D, Verapamil and Triam/hctz and Nexium. Pain on  right shoulder and both knees.  Is Patient Diabetic? Yes CBG Result 170 CBG Device ID A non fasting  Does patient need assistance? Functional Status Self care Ambulation Normal     Menstrual Status postmenopausal Last PAP Result normal   Primary Care Provider:  Tereso Newcomer PA-C  CC:  F/u from August for DM and BP. Needs refill on Levothyroxine, Vitamin D, and Verapamil and Triam/hctz and Nexium. Pain on  right shoulder and both knees. .  History of Present Illness: 77 yo [place] woman, previous pt. of Brewing technologist, PA-C  1.  Hypertension:  Had not been taking the Metorprolol for some time when investigated back in the fall of 2011, so stopped.  Continues on Diovan, Triamterene/HCTZ, Verapamil ER.  2.  Hyperlipidemia:  continues on Crestor.  Cholesterol was at goal in 02/2010.  CMET fine then as well.  Having liver profile checked monthly while on treatment for latent TB.  3.  Latent TB:  On Isoniazid 300 mg and Pyridoxine 50 mg since 06/28/10.  To be treated for 6 months per daughter in law.  Liver profile monthly at [facility].  4.  DM:  Does check sugars at home.  Sounds like generally somewhere between 145-160 generally.  Checks feet  nightly.  DIL states has eyes checked yearly--sounds like one of the ophthalmologists across from [facility].  Kidney function was okay with last labs 02/2010.  Pt. has not had flu vaccine and refuses today after discussion--pt. had a  lot of swelling and pain from a flu shot in past.  Up to date on pneumovax.  5.  Hypothyroidism:  last checked TSH in 02/2010--good level.  6.  Low Vitamin D.  Level was in low normal range last check in August 2011.  Continues on supplementation.  7.  Arthritis:  knees and shoulders--about the same.  Does take Tylenol  8  Health Maintenance: Had mammogram in January-normal  Current Medications (verified): 1)  Glucophage Xr 500 Mg Xr24h-Tab (Metformin Hcl) .... Take 2 Tablets By Mouth Every Morning 2)  Verapamil Hcl Cr 240 Mg  Cp24 (Verapamil Hcl) .... One Tablet By Mouth Daily 3)  Crestor 20 Mg  Tabs (Rosuvastatin Calcium) .... Take 1 Tablet By Mouth Once A Day 4)  Diovan 160 Mg  Tabs (Valsartan) .Marland Kitchen.. 1 Tablet By Mouth Daily 5)  Nexium 40 Mg  Cpdr (Esomeprazole Magnesium) .Marland Kitchen.. 1 By Mouth Qday 6)  Lorazepam 1 Mg  Tabs (Lorazepam) .... One Tablet By Mouth Every 8 Hours As Needed 7)  Bl Aspirin 325 Mg  Tabs (Aspirin) .Marland Kitchen.. 1 By Mouth Qday 8)  Maxzide-25 37.5-25 Mg  Tabs (Triamterene-Hctz) .Marland Kitchen.. 1 By Mouth Qam 9)  Bayer Aspirin Ec Low Dose 81 Mg  Tbec (Aspirin) .... Take One Tablet Every Afternoon 10)  Levothyroxine Sodium 50  Mcg  Tabs (Levothyroxine Sodium) .Marland Kitchen.. 1 By Mouth Daily 11)  Vitamin D 1000 Unit Tabs (Cholecalciferol) .... Take 1 Tablet By Mouth Once A Day 12)  Calcium 600 Mg Tabs (Calcium) .... 2 By Mouth Once Daily 13)  Px Enteric Aspirin 325 Mg Tbec (Aspirin) .Marland Kitchen.. 1 Tab By Mouth Daily  Allergies (verified): No Known Drug Allergies  Physical Exam  General:  NAD Lungs:  Normal respiratory effort, chest expands symmetrically. Lungs are clear to auscultation, no crackles or wheezes. Heart:  Normal rate and regular rhythm. S1 and S2 normal without gallop, murmur, click, rub or other extra sounds.  Radial pulses normal and equal Extremities:  hypertrophic changes of knees--good range of motion.  No edema  Diabetes Management Exam:    Foot Exam (with socks and/or shoes not  present):       Sensory-Monofilament:          Left foot: normal          Right foot: normal   Impression & Recommendations:  Problem # 1:  POSITIVE PPD (ICD-795.5) Latent TB--being treated for 6 months  Problem # 2:  HYPOTHYROIDISM (ICD-244.9) Stable--recheck labs in August Her updated medication list for this problem includes:    Levothyroxine Sodium 50 Mcg Tabs (Levothyroxine sodium) .Marland Kitchen... 1 by mouth daily  Problem # 3:  DM, UNCOMPLICATED, TYPE II (ICD-250.00) Stable Her updated medication list for this problem includes:    Glucophage Xr 500 Mg Xr24h-tab (Metformin hcl) .Marland Kitchen... Take 2 tablets by mouth every morning    Diovan 160 Mg Tabs (Valsartan) .Marland Kitchen... 1 tablet by mouth daily    Bl Aspirin 325 Mg Tabs (Aspirin) .Marland Kitchen... 1 by mouth qday    Bayer Aspirin Ec Low Dose 81 Mg Tbec (Aspirin) .Marland Kitchen... Take one tablet every afternoon    Px Enteric Aspirin 325 Mg Tbec (Aspirin) .Marland Kitchen... 1 tab by mouth daily  Problem # 4:  HYPERLIPIDEMIA (ICD-272.4) At [facility] in August Her updated medication list for this problem includes:    Crestor 20 Mg Tabs (Rosuvastatin calcium) .Marland Kitchen... Take 1 tablet by mouth once a day  Problem # 5:  HYPERTENSION, BENIGN ESSENTIAL (ICD-401.1) Controlled Her updated medication list for this problem includes:    Verapamil Hcl Cr 240 Mg Cp24 (Verapamil hcl) ..... One tablet by mouth daily    Diovan 160 Mg Tabs (Valsartan) .Marland Kitchen... 1 tablet by mouth daily    Maxzide-25 37.5-25 Mg Tabs (Triamterene-hctz) .Marland Kitchen... 1 by mouth qam  Problem # 6:  OSTEOARTHRITIS (ICD-715.90) Will have her try Tylenol regularly. If still with significant pain, can call in and get set up with knee injection--will need 30 minute visit. Her updated medication list for this problem includes:    Bl Aspirin 325 Mg Tabs (Aspirin) .Marland Kitchen... 1 by mouth qday    Bayer Aspirin Ec Low Dose 81 Mg Tbec (Aspirin) .Marland Kitchen... Take one tablet every afternoon    Px Enteric Aspirin 325 Mg Tbec (Aspirin) .Marland Kitchen... 1 tab by  mouth daily    Tylenol Extra Strength 500 Mg Tabs (Acetaminophen) .Marland Kitchen... 1 tab by mouth two times a day  Problem # 7:  DEFICIENCY, VITAMIN D NOS (ICD-268.9) Was fine at last check--continue Vitamin D to sustain.  Complete Medication List: 1)  Glucophage Xr 500 Mg Xr24h-tab (Metformin hcl) .... Take 2 tablets by mouth every morning 2)  Verapamil Hcl Cr 240 Mg Cp24 (Verapamil hcl) .... One tablet by mouth daily 3)  Crestor 20 Mg Tabs (Rosuvastatin calcium) .... Take 1 tablet by  mouth once a day 4)  Diovan 160 Mg Tabs (Valsartan) .Marland Kitchen.. 1 tablet by mouth daily 5)  Nexium 40 Mg Cpdr (Esomeprazole magnesium) .Marland Kitchen.. 1 by mouth qday 6)  Lorazepam 1 Mg Tabs (Lorazepam) .... One tablet by mouth every 8 hours as needed 7)  Bl Aspirin 325 Mg Tabs (Aspirin) .Marland Kitchen.. 1 by mouth qday 8)  Maxzide-25 37.5-25 Mg Tabs (Triamterene-hctz) .Marland Kitchen.. 1 by mouth qam 9)  Bayer Aspirin Ec Low Dose 81 Mg Tbec (Aspirin) .... Take one tablet every afternoon 10)  Levothyroxine Sodium 50 Mcg Tabs (Levothyroxine sodium) .Marland Kitchen.. 1 by mouth daily 11)  Vitamin D 1000 Unit Tabs (Cholecalciferol) .... Take 1 tablet by mouth once a day 12)  Calcium 600 Mg Tabs (Calcium) .... 2 by mouth once daily 13)  Px Enteric Aspirin 325 Mg Tbec (Aspirin) .Marland Kitchen.. 1 tab by mouth daily 14)  Tylenol Extra Strength 500 Mg Tabs (Acetaminophen) .Marland Kitchen.. 1 tab by mouth two times a day  Other Orders: Capillary Blood Glucose/CBG (60454)  Patient Instructions: 1)  Take Tylenol 500 mg two times a day for knee and shoulder pain--take every day, twice daily 2)  Follow up with Dr. Delrae Alfred in 6 months --DM, hypertension, Prescriptions: GLUCOPHAGE XR 500 MG XR24H-TAB (METFORMIN HCL) Take 2 tablets by mouth every morning  #60 x 11   Entered and Authorized by:   Julieanne Manson MD   Signed by:   Julieanne Manson MD on 08/31/2010   Method used:   Electronically to        [facility] # [phone]* (retail)       [address] [place], [place]  [address]        Ph: [phone]       Fax: [phone]   RxID:   [id number] MAXZIDE-25 37.5-25 MG  TABS (TRIAMTERENE-HCTZ) 1 by mouth qam  #30 Tablet x 11   Entered and Authorized by:   Julieanne Manson MD   Signed by:   Julieanne Manson MD on 08/31/2010   Method used:   Electronically to        [facility] # [phone]* (retail)       [address] [place], [place]  [address]       Ph: [phone]       Fax: [phone]   RxID:   [id number] DIOVAN 160 MG  TABS (VALSARTAN) 1 tablet by mouth daily  #30 x 11   Entered and Authorized by:   Julieanne Manson MD   Signed by:   Julieanne Manson MD on 08/31/2010   Method used:   Electronically to        [facility] # [phone]* (retail)       [address] [place], [place]  [address]       Ph: [phone]       Fax: [phone]   RxID:   [id number] VERAPAMIL HCL CR 240 MG  CP24 (VERAPAMIL HCL) One tablet by mouth daily  #30 x 11   Entered and Authorized by:   Julieanne Manson MD   Signed by:   Julieanne Manson MD on 08/31/2010   Method used:   Electronically to        [facility] # [phone]* (retail)       [address] [place], [place]  [address]  Ph: [phone]       Fax: [phone]   RxID:   [id number]    Orders Added: 1)  Capillary Blood Glucose/CBG [59563] 2)  Est. Patient Level IV [87564]   Not Administered:    Influenza Vaccine not given due to: declined    Diabetic Foot Exam Last Podiatry Exam Date: 06/13/2009    10-g (5.07) Semmes-Weinstein Monofilament Test Performed by: Hale Drone CMA          Right Foot          Left Foot Visual Inspection     normal         normal Test Control      normal         normal Site 1         normal         normal Site 2         normal         normal Site 3         normal         normal Site 4         normal         normal Site 5         normal         normal Site 6         normal         normal Site 7         normal          normal Site 8         normal         normal Site 9         normal         normal Site 10         normal         normal  Impression      normal         normal  Laboratory Results   Blood Tests   Date/Time Received: August 31, 2010 10:54 AM   HGBA1C: 7.0%   (Normal Range: Non-Diabetic - 3-6%   Control Diabetic - 6-8%) CBG Random:: 170mg /dL

## 2013-04-03 ENCOUNTER — Encounter (HOSPITAL_COMMUNITY)
Admission: EM | Disposition: A | Discharge status: Home / Self Care | Payer: Self-pay | Source: Home / Self Care | Attending: Otolaryngology

## 2013-04-03 ENCOUNTER — Encounter (HOSPITAL_COMMUNITY): Payer: Self-pay | Admitting: Emergency Medicine

## 2013-04-03 ENCOUNTER — Encounter (HOSPITAL_COMMUNITY): Payer: Self-pay | Admitting: Anesthesiology

## 2013-04-03 ENCOUNTER — Inpatient Hospital Stay (HOSPITAL_COMMUNITY): Payer: PRIVATE HEALTH INSURANCE

## 2013-04-03 ENCOUNTER — Inpatient Hospital Stay (HOSPITAL_COMMUNITY)
Admission: EM | Admit: 2013-04-03 | Discharge: 2013-04-06 | DRG: 915 | Disposition: A | Discharge status: Home / Self Care | Payer: PRIVATE HEALTH INSURANCE | Attending: Otolaryngology | Admitting: Otolaryngology

## 2013-04-03 ENCOUNTER — Emergency Department (HOSPITAL_COMMUNITY): Payer: PRIVATE HEALTH INSURANCE | Admitting: Anesthesiology

## 2013-04-03 DIAGNOSIS — T380X5A Adverse effect of glucocorticoids and synthetic analogues, initial encounter: Secondary | ICD-10-CM | POA: Diagnosis not present

## 2013-04-03 DIAGNOSIS — J9819 Other pulmonary collapse: Secondary | ICD-10-CM | POA: Diagnosis present

## 2013-04-03 DIAGNOSIS — F411 Generalized anxiety disorder: Secondary | ICD-10-CM | POA: Diagnosis present

## 2013-04-03 DIAGNOSIS — R061 Stridor: Secondary | ICD-10-CM | POA: Diagnosis present

## 2013-04-03 DIAGNOSIS — E785 Hyperlipidemia, unspecified: Secondary | ICD-10-CM | POA: Diagnosis present

## 2013-04-03 DIAGNOSIS — Y92009 Unspecified place in unspecified non-institutional (private) residence as the place of occurrence of the external cause: Secondary | ICD-10-CM

## 2013-04-03 DIAGNOSIS — T368X5A Adverse effect of other systemic antibiotics, initial encounter: Secondary | ICD-10-CM | POA: Diagnosis present

## 2013-04-03 DIAGNOSIS — I10 Essential (primary) hypertension: Secondary | ICD-10-CM | POA: Diagnosis present

## 2013-04-03 DIAGNOSIS — T783XXA Angioneurotic edema, initial encounter: Principal | ICD-10-CM | POA: Diagnosis present

## 2013-04-03 DIAGNOSIS — T783XXS Angioneurotic edema, sequela: Secondary | ICD-10-CM

## 2013-04-03 DIAGNOSIS — E119 Type 2 diabetes mellitus without complications: Secondary | ICD-10-CM | POA: Diagnosis present

## 2013-04-03 DIAGNOSIS — J384 Edema of larynx: Secondary | ICD-10-CM | POA: Diagnosis present

## 2013-04-03 DIAGNOSIS — J9600 Acute respiratory failure, unspecified whether with hypoxia or hypercapnia: Secondary | ICD-10-CM | POA: Diagnosis present

## 2013-04-03 DIAGNOSIS — E039 Hypothyroidism, unspecified: Secondary | ICD-10-CM | POA: Diagnosis present

## 2013-04-03 DIAGNOSIS — R220 Localized swelling, mass and lump, head: Secondary | ICD-10-CM | POA: Diagnosis present

## 2013-04-03 HISTORY — DX: Type 2 diabetes mellitus without complications: E11.9

## 2013-04-03 HISTORY — DX: Anxiety disorder, unspecified: F41.9

## 2013-04-03 HISTORY — DX: Essential (primary) hypertension: I10

## 2013-04-03 HISTORY — DX: Hypothyroidism, unspecified: E03.9

## 2013-04-03 HISTORY — PX: INTUBATION NASOTRACHEAL: SUR735

## 2013-04-03 LAB — CBC WITH DIFFERENTIAL/PLATELET
Basophils Relative: 0 % (ref 0–1)
Eosinophils Absolute: 0.2 10*3/uL (ref 0.0–0.7)
Eosinophils Relative: 1 % (ref 0–5)
MCH: 29.8 pg (ref 26.0–34.0)
MCHC: 33.4 g/dL (ref 30.0–36.0)
Monocytes Relative: 7 % (ref 3–12)
Neutrophils Relative %: 68 % (ref 43–77)
Platelets: 291 10*3/uL (ref 150–400)

## 2013-04-03 LAB — GLUCOSE, CAPILLARY
Glucose-Capillary: 301 mg/dL — ABNORMAL HIGH (ref 70–99)
Glucose-Capillary: 181 mg/dL — ABNORMAL HIGH (ref 70–99)

## 2013-04-03 LAB — BASIC METABOLIC PANEL
BUN: 24 mg/dL — ABNORMAL HIGH (ref 6–23)
Calcium: 9.8 mg/dL (ref 8.4–10.5)
GFR calc Af Amer: 65 mL/min — ABNORMAL LOW (ref >90)
GFR calc non Af Amer: 56 mL/min — ABNORMAL LOW (ref >90)
Potassium: 3.9 mEq/L (ref 3.5–5.1)

## 2013-04-03 SURGERY — INTUBATION, NASOTRACHEAL, FIBEROPTIC
Anesthesia: General | Site: Throat | Wound class: Clean Contaminated

## 2013-04-03 MED ORDER — MIDAZOLAM HCL 2 MG/2ML IJ SOLN
1.0000 mg | INTRAMUSCULAR | Status: DC | PRN
  Filled 2013-04-03: qty 2

## 2013-04-03 MED ORDER — INFLUENZA VAC SPLIT QUAD 0.5 ML IM SUSP
0.5000 mL | INTRAMUSCULAR | Status: AC
  Administered 2013-04-04: 0.5 mL via INTRAMUSCULAR
  Filled 2013-04-03 (×2): qty 0.5

## 2013-04-03 MED ORDER — DEXAMETHASONE SODIUM PHOSPHATE 10 MG/ML IJ SOLN
20.0000 mg | INTRAMUSCULAR | Status: DC
  Administered 2013-04-03 – 2013-04-04 (×2): 20 mg via INTRAVENOUS
  Filled 2013-04-03 (×3): qty 2

## 2013-04-03 MED ORDER — DIPHENHYDRAMINE HCL 50 MG/ML IJ SOLN
INTRAMUSCULAR | Status: AC
  Filled 2013-04-03: qty 1

## 2013-04-03 MED ORDER — FAMOTIDINE IN NACL 20-0.9 MG/50ML-% IV SOLN
20.0000 mg | Freq: Two times a day (BID) | INTRAVENOUS | Status: DC
  Administered 2013-04-03: 20 mg via INTRAVENOUS
  Filled 2013-04-03: qty 50

## 2013-04-03 MED ORDER — PROPOFOL 10 MG/ML IV EMUL
5.0000 ug/kg/min | INTRAVENOUS | Status: DC
  Administered 2013-04-04: 15 ug/kg/min via INTRAVENOUS
  Administered 2013-04-04: 30 ug/kg/min via INTRAVENOUS
  Administered 2013-04-04: 20 ug/kg/min via INTRAVENOUS
  Administered 2013-04-05: 30 ug/kg/min via INTRAVENOUS
  Filled 2013-04-03 (×6): qty 100

## 2013-04-03 MED ORDER — ALBUTEROL (5 MG/ML) CONTINUOUS INHALATION SOLN
5.0000 mg/h | INHALATION_SOLUTION | Freq: Once | RESPIRATORY_TRACT | Status: AC
  Administered 2013-04-03: 5 mg/h via RESPIRATORY_TRACT
  Filled 2013-04-03: qty 20

## 2013-04-03 MED ORDER — METHYLPREDNISOLONE SODIUM SUCC 125 MG IJ SOLR
125.0000 mg | Freq: Once | INTRAMUSCULAR | Status: AC
  Administered 2013-04-03: 125 mg via INTRAVENOUS
  Filled 2013-04-03: qty 2

## 2013-04-03 MED ORDER — BIOTENE DRY MOUTH MT LIQD
15.0000 mL | Freq: Four times a day (QID) | OROMUCOSAL | Status: DC
  Administered 2013-04-03 – 2013-04-06 (×10): 15 mL via OROMUCOSAL

## 2013-04-03 MED ORDER — EPINEPHRINE 0.3 MG/0.3ML IJ SOAJ
0.3000 mg | Freq: Once | INTRAMUSCULAR | Status: AC
  Administered 2013-04-03: 0.3 mg via INTRAMUSCULAR
  Filled 2013-04-03: qty 0.3

## 2013-04-03 MED ORDER — OXYMETAZOLINE HCL 0.05 % NA SOLN
NASAL | Status: AC
  Filled 2013-04-03: qty 15

## 2013-04-03 MED ORDER — DEXMEDETOMIDINE HCL IN NACL 200 MCG/50ML IV SOLN
0.4000 ug/kg/h | INTRAVENOUS | Status: DC
  Administered 2013-04-03: 0.5 ug/kg/h via INTRAVENOUS
  Filled 2013-04-03: qty 50

## 2013-04-03 MED ORDER — DIPHENHYDRAMINE HCL 50 MG/ML IJ SOLN
25.0000 mg | Freq: Once | INTRAMUSCULAR | Status: AC
  Administered 2013-04-03: 09:00:00 via INTRAVENOUS
  Filled 2013-04-03: qty 1

## 2013-04-03 MED ORDER — FENTANYL CITRATE 0.05 MG/ML IJ SOLN
25.0000 ug | INTRAMUSCULAR | Status: DC | PRN
  Filled 2013-04-03: qty 2

## 2013-04-03 MED ORDER — SODIUM CHLORIDE 0.9 % IV SOLN
Freq: Once | INTRAVENOUS | Status: AC
  Administered 2013-04-03: 09:00:00 via INTRAVENOUS

## 2013-04-03 MED ORDER — FAMOTIDINE IN NACL 20-0.9 MG/50ML-% IV SOLN
20.0000 mg | Freq: Two times a day (BID) | INTRAVENOUS | Status: DC
  Administered 2013-04-03 – 2013-04-04 (×4): 20 mg via INTRAVENOUS
  Filled 2013-04-03 (×5): qty 50

## 2013-04-03 MED ORDER — LEVOTHYROXINE SODIUM 100 MCG IV SOLR
25.0000 ug | Freq: Every day | INTRAVENOUS | Status: DC
  Administered 2013-04-03 – 2013-04-05 (×3): 25 ug via INTRAVENOUS
  Filled 2013-04-03 (×4): qty 5

## 2013-04-03 MED ORDER — LACTATED RINGERS IV SOLN
INTRAVENOUS | Status: DC | PRN
  Administered 2013-04-03: 10:00:00 via INTRAVENOUS

## 2013-04-03 MED ORDER — HEPARIN SODIUM (PORCINE) 5000 UNIT/ML IJ SOLN
5000.0000 [IU] | Freq: Three times a day (TID) | INTRAMUSCULAR | Status: DC
  Administered 2013-04-03 – 2013-04-05 (×6): 5000 [IU] via SUBCUTANEOUS
  Filled 2013-04-03 (×9): qty 1

## 2013-04-03 MED ORDER — CHLORHEXIDINE GLUCONATE 0.12 % MT SOLN
15.0000 mL | Freq: Two times a day (BID) | OROMUCOSAL | Status: DC
  Administered 2013-04-03 – 2013-04-05 (×5): 15 mL via OROMUCOSAL
  Filled 2013-04-03 (×5): qty 15

## 2013-04-03 MED ORDER — FENTANYL CITRATE 0.05 MG/ML IJ SOLN
25.0000 ug | INTRAMUSCULAR | Status: DC | PRN
  Administered 2013-04-03 (×3): 50 ug via INTRAVENOUS
  Filled 2013-04-03 (×3): qty 2

## 2013-04-03 MED ORDER — PROPOFOL 10 MG/ML IV BOLUS
INTRAVENOUS | Status: DC | PRN
  Administered 2013-04-03: 90 mg via INTRAVENOUS

## 2013-04-03 MED ORDER — MIDAZOLAM HCL 5 MG/5ML IJ SOLN
INTRAMUSCULAR | Status: DC | PRN
  Administered 2013-04-03: 2 mg via INTRAVENOUS

## 2013-04-03 MED ORDER — HYDROMORPHONE HCL PF 1 MG/ML IJ SOLN: 0.2500 mg | INTRAMUSCULAR | Status: DC | PRN

## 2013-04-03 MED ORDER — INSULIN ASPART 100 UNIT/ML ~~LOC~~ SOLN
0.0000 [IU] | SUBCUTANEOUS | Status: DC
  Administered 2013-04-03: 4 [IU] via SUBCUTANEOUS
  Administered 2013-04-03: 7 [IU] via SUBCUTANEOUS
  Administered 2013-04-03: 15 [IU] via SUBCUTANEOUS
  Administered 2013-04-04 (×2): 4 [IU] via SUBCUTANEOUS
  Administered 2013-04-04: 3 [IU] via SUBCUTANEOUS
  Administered 2013-04-04 (×2): 4 [IU] via SUBCUTANEOUS
  Administered 2013-04-04: 3 [IU] via SUBCUTANEOUS
  Administered 2013-04-04: 4 [IU] via SUBCUTANEOUS
  Administered 2013-04-05 (×2): 3 [IU] via SUBCUTANEOUS

## 2013-04-03 MED ORDER — MIDAZOLAM HCL 2 MG/2ML IJ SOLN
1.0000 mg | INTRAMUSCULAR | Status: DC | PRN
  Administered 2013-04-03 (×2): 1 mg via INTRAVENOUS
  Administered 2013-04-03 – 2013-04-04 (×3): 2 mg via INTRAVENOUS
  Filled 2013-04-03 (×4): qty 2

## 2013-04-03 MED ORDER — LACTATED RINGERS IV SOLN: INTRAVENOUS | Status: DC

## 2013-04-03 MED ORDER — POTASSIUM CHLORIDE IN NACL 20-0.45 MEQ/L-% IV SOLN
INTRAVENOUS | Status: DC
  Administered 2013-04-03 – 2013-04-05 (×5): via INTRAVENOUS
  Filled 2013-04-03 (×8): qty 1000

## 2013-04-03 MED ORDER — METHYLPREDNISOLONE SODIUM SUCC 125 MG IJ SOLR
INTRAMUSCULAR | Status: AC
  Filled 2013-04-03: qty 2

## 2013-04-03 MED ORDER — LABETALOL HCL 5 MG/ML IV SOLN
10.0000 mg | INTRAVENOUS | Status: DC | PRN
  Administered 2013-04-03 – 2013-04-05 (×5): 10 mg via INTRAVENOUS
  Filled 2013-04-03 (×5): qty 4

## 2013-04-03 MED ORDER — DIPHENHYDRAMINE HCL 50 MG/ML IJ SOLN
25.0000 mg | Freq: Four times a day (QID) | INTRAMUSCULAR | Status: DC
  Administered 2013-04-03 – 2013-04-05 (×8): 25 mg via INTRAVENOUS
  Filled 2013-04-03: qty 0.5
  Filled 2013-04-03: qty 1
  Filled 2013-04-03: qty 0.5
  Filled 2013-04-03: qty 1
  Filled 2013-04-03 (×3): qty 0.5
  Filled 2013-04-03 (×2): qty 1
  Filled 2013-04-03 (×3): qty 0.5

## 2013-04-03 SURGICAL SUPPLY — 2 items
KIT BASIN OR (CUSTOM PROCEDURE TRAY) ×2 IMPLANT
PACK BASIC VI WITH GOWN DISP (CUSTOM PROCEDURE TRAY) ×2 IMPLANT

## 2013-04-03 NOTE — ED Notes (Signed)
Pt's daughter gave her clindamycin this morning and around 0600 pt's tongue started swelling.

## 2013-04-03 NOTE — Significant Event (Signed)
Pt's PCP is Dr. Lerry Liner [phone]).  Spoke with covering physician at Dr. Mayford Knife' office.  They did not have record of recent office visit or phone conversation with pt to prescribe clindamycin.  Upon further questioning of pt's daughter script bottle for clindamycin is from 2012.  Pt's daughter states there is another bottle at home.  She could not clearly tell me when that script is from.  Also it is not clear if "swelling" started before or after pt's daughter gave pt clindamycin this morning.  Coralyn Helling, MD [facility] Pulmonary/Critical Care 04/03/2013, 1:30 PM Pager:  [phone] After 3pm call: [phone]

## 2013-04-03 NOTE — ED Notes (Signed)
ent cart at bedside 

## 2013-04-03 NOTE — Progress Notes (Signed)
Patient declines MyChart activation-no computer 

## 2013-04-03 NOTE — Consult Note (Signed)
PULMONARY  / CRITICAL CARE MEDICINE  Name: Jo Larson MRN: [id number] DOB: [birth year]    ADMISSION DATE:  04/03/2013 CONSULTATION DATE:  04/03/2013  REFERRING MD : Christia Reading  CHIEF COMPLAINT:  Tongue swelling  BRIEF PATIENT DESCRIPTION:  79 yo female started on clindamycin as outpt (unknown reason) and developed tongue swelling and difficulty breathing 1 hour after taking Abx.  Had similar event 1 year prior.  Required intubation by ENT in OR.  PCCM asked to assist with vent management.  SIGNIFICANT EVENTS: 9/12 Angioedema, intubated in OR by ENT  STUDIES:   LINES / TUBES: ETT 9/12 >>   CULTURES: None  ANTIBIOTICS: None  PAST MEDICAL HISTORY :  Past Medical History  Diagnosis Date  . Diabetes mellitus without complication   . Hypertension   . Anxiety   . Hypothyroidism    History reviewed. No pertinent past surgical history. Prior to Admission medications   Medication Sig Start Date End Date Taking? Authorizing Provider  aspirin 325 MG EC tablet Take 325 mg by mouth daily.   Yes Historical Provider, MD  cholecalciferol (VITAMIN D) 1000 UNITS tablet Take 1,000 Units by mouth daily.   Yes Historical Provider, MD  clindamycin (CLEOCIN) 300 MG capsule Take 300 mg by mouth every 6 (six) hours.   Yes Historical Provider, MD  esomeprazole (NEXIUM) 40 MG capsule Take 40 mg by mouth daily before breakfast.   Yes Historical Provider, MD  fish oil-omega-3 fatty acids 1000 MG capsule Take 1,200 mg by mouth 2 (two) times daily.   Yes Historical Provider, MD  levothyroxine (SYNTHROID, LEVOTHROID) 50 MCG tablet Take 50 mcg by mouth daily before breakfast.   Yes Historical Provider, MD  LORazepam (ATIVAN) 1 MG tablet Take 1 mg by mouth 2 (two) times daily.   Yes Historical Provider, MD  metFORMIN (GLUCOPHAGE) 500 MG tablet Take 500 mg by mouth 2 (two) times daily with a meal.   Yes Historical Provider, MD  triamterene-hydrochlorothiazide (DYAZIDE) 37.5-25 MG per capsule Take 1  capsule by mouth daily.   Yes Historical Provider, MD  verapamil (CALAN-SR) 240 MG CR tablet Take 240 mg by mouth daily.   Yes Historical Provider, MD   Allergies  Allergen Reactions  . Clindamycin/Lincomycin Swelling    Tongue swelling    FAMILY HISTORY:  History reviewed. No pertinent family history. SOCIAL HISTORY:  reports that she has never smoked. She has never used smokeless tobacco. She reports that she does not drink alcohol. Her drug history is not on file.  REVIEW OF SYSTEMS:   Unable to obtain  SUBJECTIVE:   VITAL SIGNS: Temp:  [97.6 F (36.4 C)-97.7 F (36.5 C)] 97.6 F (36.4 C) (09/12 1128) Pulse Rate:  [84-87] 87 (09/12 0826) Resp:  [19-24] 19 (09/12 0826) BP: (196)/(87) 196/87 mmHg (09/12 0823) SpO2:  [96 %] 96 % (09/12 0935) Weight:  [176 lb 5.9 oz (80 kg)-180 lb 12.4 oz (82 kg)] 176 lb 5.9 oz (80 kg) (09/12 1128) HEMODYNAMICS:   VENTILATOR SETTINGS:   INTAKE / OUTPUT: Intake/Output     09/11 0701 - 09/12 0700 09/12 0701 - 09/13 0700   I.V. (mL/kg)  900 (11.3)   Total Intake(mL/kg)  900 (11.3)   Net   +900          PHYSICAL EXAMINATION: General:  No distress Neuro:  Follows commands, moves extremities HEENT:  Tongue swollen/protruding from mouth, ETT in place Cardiovascular:  Regular, no murmur Lungs:  No wheeze/rales Abdomen:  Soft, non tender Musculoskeletal:  No edema Skin:  No rashes  LABS:  CBC Recent Labs     04/03/13  0828  WBC  16.6*  HGB  14.2  HCT  42.5  PLT  291   Coag's No results found for this basename: APTT, INR,  in the last 72 hours  BMET Recent Labs     04/03/13  0828  NA  138  K  3.9  CL  99  CO2  29  BUN  24*  CREATININE  0.94  GLUCOSE  151*   Electrolytes Recent Labs     04/03/13  0828  CALCIUM  9.8   Sepsis Markers No results found for this basename: LACTICACIDVEN, PROCALCITON, O2SATVEN,  in the last 72 hours  ABG No results found for this basename: PHART, PCO2ART, PO2ART,  in the last 72  hours  Liver Enzymes No results found for this basename: AST, ALT, ALKPHOS, BILITOT, ALBUMIN,  in the last 72 hours  Cardiac Enzymes No results found for this basename: TROPONINI, PROBNP,  in the last 72 hours  Glucose No results found for this basename: GLUCAP,  in the last 72 hours  Imaging No results found.   ASSESSMENT / PLAN:  PULMONARY A: Acute respiratory failure 2nd to angioedema. P:   -needs airway not vent >> can wean on pressure support as tolerated -extubate with edema resolved -f/u CXR -continue decadron, benadryl, pepcid  CARDIOVASCULAR A:  Hx of HTN, hyperlipidemia. P:  -prn labetalol IV for SBP > 170 -hold outpt ASA, fish oil, dyazide, calan  RENAL A:   No issues. P:   -continue IV fluids -monitor renal fx, urine outpt, electrolytes  GASTROINTESTINAL A:   Nutrition. P:   -add tube feeds if unable to extubate soon -pepcid for SUP  HEMATOLOGIC A:   No issues. P:  -f/u CBC -SQ heparin for DVT prevention  INFECTIOUS A:   Was on clindamycin as outpt >> unclear reason why.  No clinical evidence for infection at present P:   -monitor clincally  ENDOCRINE A:   DM type II with steroid induced hyperglycemia. Hypothyroidism. P:   -SSI -levothyroxine IV until able to take enteral medications  NEUROLOGIC A:   Sedation. Hx of anxiety. P:   -prn sedation while on vent  TODAY'S SUMMARY:  Angioedema with VDRF 2nd to clindamycin >> she should never get clindamycin again.  Updated family at bedside.  CC time 40 minutes.  Coralyn Helling, MD [facility] Pulmonary/Critical Care 04/03/2013, 12:11 PM Pager:  [phone] After 3pm call: [phone]

## 2013-04-03 NOTE — Procedures (Signed)
Preop diagnosis: Angioedema Postop diagnosis: Same Procedure: Transnasal fiberoptic laryngoscopy Surgeon: Jenne Pane Anesth: Topical with 4% lidocaine Comp: None Findings: Marked edema of supraglottis on both sides narrows the airway significantly.  Glottis is free of edema but supraglottic edema collapses during inspiration. Description: After discussing risks and benefits of the procedure with her daughter, the right nasal passage was sprayed with 4% lidocaine.  The fiberoptic scope was passed gently through the right nasal passage to view the pharynx and larynx.  Once completed, the scope was removed and she was returned to nursing care in stable condition.

## 2013-04-03 NOTE — Progress Notes (Signed)
eLink Physician-Brief Progress Note Patient Name: Jo Larson DOB: 07-16-1932 MRN: [id number]  Date of Service  04/03/2013   HPI/Events of Note   Hypertension and anxiety  eICU Interventions  Started propofol for sedation of intubated patient   Intervention Category Intermediate Interventions: Medication change / dose adjustment  GIDDINGS, OLIVIA K. 04/03/2013, 11:48 PM

## 2013-04-03 NOTE — ED Provider Notes (Signed)
CSN: [id number]     Arrival date & time 04/03/13  2130 History   None    Chief Complaint  Patient presents with  . Allergic Reaction   (Consider location/radiation/quality/duration/timing/severity/associated sxs/prior Treatment) HPI Comments: Pt woke up this morning around 6am with tongue swelling, difficulty breathing.  Has hx of similar about 1 year ago, was given clindamycin which he states improved his symptoms.  Pt is currently taking clindamycin for unknown infection per daughter. She denies ab pain, rash, itching, n/v, d/a.   Patient is a 79 y.o. female presenting with allergic reaction. The history is provided by the patient and a relative. The history is limited by the condition of the patient. No language interpreter was used.  Allergic Reaction Presenting symptoms: difficulty breathing, difficulty swallowing and swelling   Difficulty breathing:    Severity:  Moderate   Onset quality:  Unable to specify   Duration:  1 hour   Timing:  Constant   Progression:  Worsening Difficulty swallowing:    Severity:  Moderate   Onset quality:  Unable to specify   Duration:  1 hour   Timing:  Constant   Progression:  Worsening Swelling:    Location:  Mouth (tongue)   Onset quality:  Unable to specify   Duration:  1 hour   Timing:  Constant   Progression:  Worsening   Chronicity:  Recurrent Severity:  Severe Prior allergic episodes:  Allergies to medications Context: medications (clindamycin)   Relieved by:  Nothing Worsened by:  Nothing tried Ineffective treatments:  None tried   History reviewed. No pertinent past medical history. History reviewed. No pertinent past surgical history. No family history on file. History  Substance Use Topics  . Smoking status: Never Smoker   . Smokeless tobacco: Not on file  . Alcohol Use: No   OB History   Grav Para Term Preterm Abortions TAB SAB Ect Mult Living                 Review of Systems  Constitutional: Negative for fever,  chills, diaphoresis, activity change, appetite change and fatigue.  HENT: Positive for trouble swallowing. Negative for congestion, sore throat, facial swelling, rhinorrhea, neck pain and neck stiffness.   Eyes: Negative for photophobia and discharge.  Respiratory: Positive for shortness of breath and stridor. Negative for cough and chest tightness.   Cardiovascular: Negative for chest pain, palpitations and leg swelling.  Gastrointestinal: Negative for nausea, vomiting, abdominal pain and diarrhea.  Endocrine: Negative for polydipsia and polyuria.  Genitourinary: Negative for dysuria, frequency, difficulty urinating and pelvic pain.  Musculoskeletal: Negative for back pain and arthralgias.  Skin: Negative for color change and wound.  Allergic/Immunologic: Negative for immunocompromised state.  Neurological: Negative for facial asymmetry, weakness, numbness and headaches.  Hematological: Does not bruise/bleed easily.  Psychiatric/Behavioral: Negative for confusion and agitation.    Allergies  Clindamycin/lincomycin  Home Medications   Current Outpatient Rx  Name  Route  Sig  Dispense  Refill  . aspirin 325 MG EC tablet   Oral   Take 325 mg by mouth daily.         . cholecalciferol (VITAMIN D) 1000 UNITS tablet   Oral   Take 1,000 Units by mouth daily.         . clindamycin (CLEOCIN) 300 MG capsule   Oral   Take 300 mg by mouth every 6 (six) hours.         Marland Kitchen esomeprazole (NEXIUM) 40 MG capsule   Oral  Take 40 mg by mouth daily before breakfast.         . fish oil-omega-3 fatty acids 1000 MG capsule   Oral   Take 1,200 mg by mouth 2 (two) times daily.         Marland Kitchen levothyroxine (SYNTHROID, LEVOTHROID) 50 MCG tablet   Oral   Take 50 mcg by mouth daily before breakfast.         . LORazepam (ATIVAN) 1 MG tablet   Oral   Take 1 mg by mouth 2 (two) times daily.         . metFORMIN (GLUCOPHAGE) 500 MG tablet   Oral   Take 500 mg by mouth 2 (two) times daily  with a meal.         . triamterene-hydrochlorothiazide (DYAZIDE) 37.5-25 MG per capsule   Oral   Take 1 capsule by mouth daily.         . verapamil (CALAN-SR) 240 MG CR tablet   Oral   Take 240 mg by mouth daily.          BP 196/87  Pulse 87  Temp(Src) 97.7 F (36.5 C) (Oral)  Resp 19  Wt 180 lb 12.4 oz (82 kg)  BMI 34.18 kg/m2  SpO2 96% Physical Exam  Constitutional: She is oriented to person, place, and time. She appears well-developed and well-nourished. She appears distressed.  HENT:  Head: Normocephalic and atraumatic.  Mouth/Throat: No oropharyngeal exudate.  Severely edematous tongue, drooling, inspiratory stridor. Only able to partially visualize hard palate on mouth opening.  Submandibular/submental fullness also palpated.    Eyes: Pupils are equal, round, and reactive to light.  Neck: Normal range of motion. Neck supple.  Cardiovascular: Normal rate, regular rhythm and normal heart sounds.  Exam reveals no gallop and no friction rub.   No murmur heard. Pulmonary/Chest: Effort normal and breath sounds normal. No respiratory distress. She has no wheezes. She has no rales.  Abdominal: Soft. Bowel sounds are normal. She exhibits no distension and no mass. There is no tenderness. There is no rebound and no guarding.  Musculoskeletal: Normal range of motion. She exhibits no edema and no tenderness.  Neurological: She is alert and oriented to person, place, and time.  Skin: Skin is warm and dry.  Psychiatric: She has a normal mood and affect.    ED Course  Procedures (including critical care time) Labs Review Labs Reviewed  CBC WITH DIFFERENTIAL - Abnormal; Notable for the following:    WBC 16.6 (*)    Neutro Abs 11.4 (*)    Monocytes Absolute 1.1 (*)    All other components within normal limits  BASIC METABOLIC PANEL - Abnormal; Notable for the following:    Glucose, Bld 151 (*)    BUN 24 (*)    GFR calc non Af Amer 56 (*)    GFR calc Af Amer 65 (*)    All  other components within normal limits   Imaging Review No results found.  9:50 am ENT at bedside, pt has had some improvement of tongue edema, now able to hear slight wheezing.  Albuterol neb started  MDM   1. Angioedema, initial encounter    Pt is a 79 y.o. female with Pmhx as above who presents with severe tongue swelling beginning about 6am this morning, now with drooling, inspiratory stridor and SOB.  O2 sat currently 97%. No rash, n/v, d/a, ab pain, pruritis.  Concern for angioedema vs anaphylaxis with impending airway compromise.  Have ordered  anaphylaxis meds, have consulted ENT.  10:26 AM Pt has had some improvement after treatment.  ENT has evaluated pt, is taking to the OR for intubation.   1. Angioedema, initial encounter         Shanna Cisco, MD 04/03/13 1032

## 2013-04-03 NOTE — Anesthesia Preprocedure Evaluation (Signed)
Anesthesia Evaluation  Patient identified by MRN, date of birth, ID band Patient awake    Reviewed: Allergy & Precautions, H&P , NPO status , Patient's Chart, lab work & pertinent test results, reviewed documented beta blocker date and time   Airway Mallampati: II TM Distance: >3 FB Neck ROM: full    Dental no notable dental hx.    Pulmonary neg pulmonary ROS,  Angio edema of airway due to antibiotic allergy breath sounds clear to auscultation  Pulmonary exam normal       Cardiovascular Exercise Tolerance: Good hypertension, Pt. on medications Rhythm:regular Rate:Normal     Neuro/Psych negative neurological ROS  negative psych ROS   GI/Hepatic negative GI ROS, Neg liver ROS,   Endo/Other  diabetes, Well Controlled, Type 2, Oral Hypoglycemic AgentsHypothyroidism   Renal/GU negative Renal ROS  negative genitourinary   Musculoskeletal   Abdominal   Peds  Hematology negative hematology ROS (+)   Anesthesia Other Findings   Reproductive/Obstetrics negative OB ROS                           Anesthesia Physical Anesthesia Plan  ASA: IV and emergent  Anesthesia Plan: General   Post-op Pain Management:    Induction:   Airway Management Planned: Awake Intubation Planned and Fiberoptic Intubation Planned  Additional Equipment:   Intra-op Plan:   Post-operative Plan:   Informed Consent: I have reviewed the patients History and Physical, chart, labs and discussed the procedure including the risks, benefits and alternatives for the proposed anesthesia with the patient or authorized representative who has indicated his/her understanding and acceptance.   Dental Advisory Given  Plan Discussed with: CRNA and Surgeon  Anesthesia Plan Comments:         Anesthesia Quick Evaluation

## 2013-04-03 NOTE — Transfer of Care (Signed)
Immediate Anesthesia Transfer of Care Note  Patient: Jo Larson  Procedure(s) Performed: Procedure(s): FIBEROPTIC NASAL-TRACHEAL INTUBATION (N/A)  Patient Location: PACU and ICU  Anesthesia Type:General  Level of Consciousness: sedated and unresponsive  Airway & Oxygen Therapy: Patient remains intubated per anesthesia plan and Patient placed on Ventilator (see vital sign flow sheet for setting)  Post-op Assessment: Report given to PACU RN and Post -op Vital signs reviewed and stable  Post vital signs: Reviewed and stable  Complications: No apparent anesthesia complications

## 2013-04-03 NOTE — Consult Note (Signed)
Reason for Consult:angioedema Referring Physician: ER  Jo Larson is an 79 y.o. female.  HPI: 79 year old with tongue swelling that started this morning and was associated with some throat soreness and difficulty breathing.  She never lost her airway or turned blue.  She was brought to the ER by her daughter and has been treated with Pepcid, albuterol, epinephrine, Benadryl, and Solu-medrol.  Her tongue swelling has improved.  She had a similar event one year ago, but less severe, that was evidently treated, at least in part, with clindamycin.  Her daughter gave her some leftover clindamycin this morning without improvement.  History reviewed. No pertinent past medical history.  History reviewed. No pertinent past surgical history.  No family history on file.  Social History:  reports that she has never smoked. She does not have any smokeless tobacco history on file. She reports that she does not drink alcohol. Her drug history is not on file.  Allergies:  Allergies  Allergen Reactions  . Clindamycin/Lincomycin Swelling    Tongue swelling    Medications: I have reviewed the patient's current medications.  Results for orders placed during the hospital encounter of 04/03/13 (from the past 48 hour(s))  CBC WITH DIFFERENTIAL     Status: Abnormal   Collection Time    04/03/13  8:28 AM      Result Value Range   WBC 16.6 (*) 4.0 - 10.5 K/uL   RBC 4.77  3.87 - 5.11 MIL/uL   Hemoglobin 14.2  12.0 - 15.0 g/dL   HCT 95.6  21.3 - 08.6 %   MCV 89.1  78.0 - 100.0 fL   MCH 29.8  26.0 - 34.0 pg   MCHC 33.4  30.0 - 36.0 g/dL   RDW 57.8  46.9 - 62.9 %   Platelets 291  150 - 400 K/uL   Neutrophils Relative % 68  43 - 77 %   Neutro Abs 11.4 (*) 1.7 - 7.7 K/uL   Lymphocytes Relative 24  12 - 46 %   Lymphs Abs 3.9  0.7 - 4.0 K/uL   Monocytes Relative 7  3 - 12 %   Monocytes Absolute 1.1 (*) 0.1 - 1.0 K/uL   Eosinophils Relative 1  0 - 5 %   Eosinophils Absolute 0.2  0.0 - 0.7 K/uL    Basophils Relative 0  0 - 1 %   Basophils Absolute 0.0  0.0 - 0.1 K/uL  BASIC METABOLIC PANEL     Status: Abnormal   Collection Time    04/03/13  8:28 AM      Result Value Range   Sodium 138  135 - 145 mEq/L   Potassium 3.9  3.5 - 5.1 mEq/L   Chloride 99  96 - 112 mEq/L   CO2 29  19 - 32 mEq/L   Glucose, Bld 151 (*) 70 - 99 mg/dL   BUN 24 (*) 6 - 23 mg/dL   Creatinine, Ser 5.28  0.50 - 1.10 mg/dL   Calcium 9.8  8.4 - 41.3 mg/dL   GFR calc non Af Amer 56 (*) >90 mL/min   GFR calc Af Amer 65 (*) >90 mL/min   Comment: (NOTE)     The eGFR has been calculated using the CKD EPI equation.     This calculation has not been validated in all clinical situations.     eGFR's persistently <90 mL/min signify possible Chronic Kidney     Disease.    No results found.  Review of  Systems  HENT: Positive for sore throat.   Respiratory: Positive for shortness of breath, wheezing and stridor.   Cardiovascular: Positive for chest pain.   Blood pressure 196/87, pulse 87, temperature 97.7 F (36.5 C), temperature source Oral, resp. rate 19, SpO2 96.00%. Physical Exam  Constitutional: She is oriented to person, place, and time. She appears well-developed and well-nourished. No distress.  HENT:  Head: Normocephalic and atraumatic.  Right Ear: External ear normal.  Left Ear: External ear normal.  Nose: Nose normal.  Mouth/Throat: Oropharynx is clear and moist.  Prominent floor of mouth edema, tongue mildly edematous and slightly protruding.  Oropharynx appears uninvolved with edema.  Voice fairly normal.  Soft inspiratory stridor with deep inspiration.  Eyes: Conjunctivae and EOM are normal. Pupils are equal, round, and reactive to light.  Neck: Normal range of motion. Neck supple.  Mildly diffusely tender.  Normal landmarks.  No edema.  Cardiovascular: Normal rate.   Respiratory: Effort normal. No respiratory distress. She has wheezes.  GI:  Did not examine.  Genitourinary:  Did not examine.   Musculoskeletal: Normal range of motion.  Neurological: She is alert and oriented to person, place, and time. No cranial nerve deficit.  Skin: Skin is warm and dry.  Psychiatric: She has a normal mood and affect. Her behavior is normal. Judgment and thought content normal.    Assessment/Plan: Angioedema The floor of mouth is prominently involved.  It seems that her condition has improved since being brought to the ER.  A fiberoptic exam was performed at the bedside (see separate procedure note) showing fairly severe involvement of the supraglottis, however.  Any worsening at that location could result in serious distress and negative consequences.  In discussing the situation with her daughter, we agreed that the safest thing to do would be to secure her airway in the operating room via transnasal fiberoptic intubation.  Risks, benefits, and alternatives were discussed.  I discussed her case with CCM who will help manage her in the ICU setting after intubation.  I anticipate the tube will only be needed for a couple of days.  When oral edema reduces and she has a good air leak, extubation will be considered.  She will be treated with steroids and H1 and H2 blockers.  She may need evaluation by an allergist after recovery for recurring angioedema.  She does not take ACE-Is.  Jo Larson 04/03/2013, 10:01 AM

## 2013-04-03 NOTE — Brief Op Note (Signed)
04/03/2013  10:43 AM  PATIENT:  Jo Larson  79 y.o. female  PRE-OPERATIVE DIAGNOSIS:  ANGIO EDEMA  POST-OPERATIVE DIAGNOSIS:  * No post-op diagnosis entered *  PROCEDURE:  AWAKE INTUBATION  SURGEON:  Surgeon(s) and Role:    * Christia Reading, MD - Primary  PHYSICIAN ASSISTANT:   ASSISTANTS: none   ANESTHESIA:   general  EBL:     BLOOD ADMINISTERED:none  DRAINS: none   LOCAL MEDICATIONS USED:  OTHER cetacaine  SPECIMEN:  No Specimen  DISPOSITION OF SPECIMEN:  N/A  COUNTS:  YES  TOURNIQUET:  * No tourniquets in log *  DICTATION: .Other Dictation: Dictation Number [id number]  PLAN OF CARE: Admit to inpatient   PATIENT DISPOSITION:  ICU - intubated and hemodynamically stable.   Delay start of Pharmacological VTE agent (>24hrs) due to surgical blood loss or risk of bleeding: no

## 2013-04-03 NOTE — Anesthesia Postprocedure Evaluation (Signed)
  Anesthesia Post-op Note  Patient: Jo Larson  Procedure(s) Performed: Procedure(s) (LRB): FIBEROPTIC NASAL-TRACHEAL INTUBATION (N/A)  Patient Location: ICU  Anesthesia Type: General  Level of Consciousness:  sedated  Airway and Oxygen Therapy: on vent  Post-op Pain: mild  Post-op Assessment: Post-op Vital signs reviewed, Patient's Cardiovascular Status Stable, Respiratory Function Stable, Patent Airway and No signs of Nausea or vomiting  Last Vitals:  Filed Vitals:   04/03/13 0826  BP:   Pulse: 87  Temp:   Resp: 19    Post-op Vital Signs: stable   Complications: No apparent anesthesia complications

## 2013-04-04 ENCOUNTER — Inpatient Hospital Stay (HOSPITAL_COMMUNITY): Payer: PRIVATE HEALTH INSURANCE

## 2013-04-04 DIAGNOSIS — T788XXS Other adverse effects, not elsewhere classified, sequela: Secondary | ICD-10-CM

## 2013-04-04 LAB — BASIC METABOLIC PANEL
Calcium: 9.3 mg/dL (ref 8.4–10.5)
Creatinine, Ser: 0.76 mg/dL (ref 0.50–1.10)
GFR calc Af Amer: >90 mL/min (ref >90)

## 2013-04-04 LAB — CBC
Platelets: 254 10*3/uL (ref 150–400)
RDW: 13.6 % (ref 11.5–15.5)
WBC: 14.9 10*3/uL — ABNORMAL HIGH (ref 4.0–10.5)

## 2013-04-04 LAB — GLUCOSE, CAPILLARY
Glucose-Capillary: 153 mg/dL — ABNORMAL HIGH (ref 70–99)
Glucose-Capillary: 144 mg/dL — ABNORMAL HIGH (ref 70–99)
Glucose-Capillary: 173 mg/dL — ABNORMAL HIGH (ref 70–99)

## 2013-04-04 MED ORDER — HYDRALAZINE HCL 20 MG/ML IJ SOLN
10.0000 mg | INTRAMUSCULAR | Status: DC | PRN
  Administered 2013-04-04 – 2013-04-05 (×3): 10 mg via INTRAVENOUS
  Filled 2013-04-04 (×4): qty 1

## 2013-04-04 NOTE — Op Note (Signed)
Jo Larson, Jo Larson              ACCOUNT NO.:  [id number]  MEDICAL RECORD NO.:  [id number]  LOCATION:  1241                         FACILITY:  [facility]  PHYSICIAN:  Antony Contras, MD     DATE OF BIRTH:  1931-10-23  DATE OF PROCEDURE:  04/03/2013 DATE OF DISCHARGE:                              OPERATIVE REPORT   PREOPERATIVE DIAGNOSIS:  Angioedema.  POSTOPERATIVE DIAGNOSIS:  Angioedema.  PROCEDURE:  Awake intubation.  SURGEON:  Antony Contras, MD  ANESTHESIA:  General sedation and topical.  COMPLICATIONS:  None.  INDICATIONS:  The patient is a 79 year old female who came to the emergency room this morning with tongue swelling and stridor. Fiberoptic exam at the bedside demonstrates marked edema of the supraglottis on both sides narrowing the airway significantly.  For precaution, she was brought to the operating room for intubation.  FINDINGS:  In the operating room, the patient was given sedation after spraying the nose and the throat with topical anesthesia.  The level of sedation allowed her to continue to breathe comfortably while the anesthesia team was able to perform a transoral intubation using the glide scope.  The supraglottic edema was noted.  DESCRIPTION OF PROCEDURE:  After informed consent was obtained, the patient was brought to the operative suite, put on the table in supine position.  Sedation was induced.  The Anesthesia team sprayed the back of the throat with Cetacaine and the nose with Afrin.  Once she was sedated, the glide scope was inserted into the mouth by the Anesthesia team and was used to view the larynx.  This allowed exposure enough that she could be intubated through the mouth using that technique by the Anesthesia team.  Once the tube was secured, the scope was removed and the tube was taped to the right side of the mouth.  She was more deeply sedated after intubation.  She was moved to the recovery room in stable condition and then to be  admitted to the intensive care unit.     Antony Contras, MD     DDB/MEDQ  D:  04/03/2013  T:  04/04/2013  Job:  [id number]

## 2013-04-04 NOTE — Progress Notes (Signed)
1 Day Post-Op  Subjective: Doing well but agitated on ventilator.  No problems.  Objective: Vital signs in last 24 hours: Temp:  [97.6 F (36.4 C)-99.7 F (37.6 C)] 99.2 F (37.3 C) (09/13 0800) Pulse Rate:  [59-86] 70 (09/13 0800) Resp:  [12-24] 19 (09/13 0800) BP: (105-213)/(39-151) 181/65 mmHg (09/13 0800) SpO2:  [96 %-100 %] 97 % (09/13 0800) FiO2 (%):  [30 %-40 %] 30 % (09/13 0800) Weight:  [75.3 kg (166 lb 0.1 oz)-82 kg (180 lb 12.4 oz)] 75.3 kg (166 lb 0.1 oz) (09/13 0406)    Intake/Output from previous day: 09/12 0701 - 09/13 0700 In: 2787.3 [I.V.:2687.3; IV Piggyback:100] Out: 1660 [Urine:1660] Intake/Output this shift: Total I/O In: 104.8 [I.V.:104.8] Out: 150 [Urine:150]  General appearance: alert, cooperative and intubated.  Somewhat agitated. Throat: left floor of mouth edema resolved, right floor of mouth remains markedly edematous.  Lab Results:   Recent Labs  04/03/13 0828 04/04/13 0335  WBC 16.6* 14.9*  HGB 14.2 12.5  HCT 42.5 36.8  PLT 291 254   BMET  Recent Labs  04/03/13 0828 04/04/13 0335  NA 138 140  K 3.9 3.5  CL 99 103  CO2 29 27  GLUCOSE 151* 180*  BUN 24* 19  CREATININE 0.94 0.76  CALCIUM 9.8 9.3   PT/INR No results found for this basename: LABPROT, INR,  in the last 72 hours ABG No results found for this basename: PHART, PCO2, PO2, HCO3,  in the last 72 hours  Studies/Results: Dg Chest Port 1 View  04/04/2013   CLINICAL DATA:  FOLLOW UP atelectasis and endotracheal tube position.  EXAM: PORTABLE CHEST - 1 VIEW  COMPARISON:  04/03/2013  FINDINGS: Endotracheal tube ends in the mid thoracic trachea. New OGT ends in the stomach.  Bandlike opacities in the lower lungs persist. No evidence of pneumothorax. Chronic cardiomegaly. Aortic atherosclerosis.  IMPRESSION: 1. New orogastric tube in good position. 2. Endotracheal tube ends in the mid thoracic trachea. 3. Persistent lower lung atelectasis bilaterally.   Electronically Signed    By: Tiburcio Pea   On: 04/04/2013 06:46   Dg Chest Port 1 View  04/03/2013   CLINICAL DATA:  Endotracheal tube placement  EXAM: PORTABLE CHEST - 1 VIEW  COMPARISON:  05/20/2010  FINDINGS: Endotracheal tube tip lies 5.2 cm above the Carina. Consider inserting an additional 2-3 cm for more optimal positioning.  The heart is normal in size and configuration. The mediastinum is normal in contour. There are no hilar masses.  Mild medial lung base atelectasis. The lungs are otherwise clear. No pleural effusion or pneumothorax.  IMPRESSION: Endotracheal tube tip 5.2 cm above the Carina.  No acute cardiopulmonary disease.   Electronically Signed   By: Amie Portland   On: 04/03/2013 12:45   Dg Abd Portable 1v  04/03/2013   *RADIOLOGY REPORT*  Clinical Data: Nasogastric tube placement.  PORTABLE ABDOMEN - 1 VIEW  Comparison: None.  Findings: Nasogastric tube tip gastric fundus.  The side-hole is 2.5 cm beyond the gastroesophageal junction.  Gas filled dilated small and normal size large bowel with small bowel loops measuring up to 3.2 cm and large bowel measuring up to 6.9 cm.  This may represent an ileus.  Distal obstructing lesion not excluded.  The possibility of free intraperitoneal air cannot be addressed on a supine view.  IMPRESSION: Nasogastric tube tip gastric fundus.  The side-hole is 2.5 cm beyond the gastroesophageal junction.  Gas filled dilated small and normal size large bowel with  small bowel loops measuring up to 3.2 cm and large bowel measuring up to 6.9 cm.  This may represent an ileus.  Distal obstructing lesion not excluded.   Original Report Authenticated By: Lacy Duverney, M.D.    Anti-infectives: Anti-infectives   None      Assessment/Plan: s/p AWAKE INTUBATION for angioedema of unclear etiology involving floor of mouth and supraglottis Air leak test performed at the bedside and only a small leak heard intermittently around her 6 ETT.  Will remain intubated for another day and  reassess tomorrow.  Should be able to extubate tomorrow.  Appreciate CCM assistance.  LOS: 1 day    Jo Larson 04/04/2013

## 2013-04-04 NOTE — Progress Notes (Signed)
PULMONARY  / CRITICAL CARE MEDICINE  Name: Jo Larson MRN: [id number] DOB: 05-21-1932    ADMISSION DATE:  04/03/2013 CONSULTATION DATE:  04/03/2013  REFERRING MD : Christia Reading  CHIEF COMPLAINT:  Tongue swelling  BRIEF PATIENT DESCRIPTION:  79 yo female started on clindamycin as outpt (unknown reason) and developed tongue swelling and difficulty breathing 1 hour after taking Abx.  Had similar event 1 year prior.  Required intubation by ENT in OR.  PCCM asked to assist with vent management.  SIGNIFICANT EVENTS: 9/12 Angioedema, intubated in OR by ENT  STUDIES:   LINES / TUBES: ETT 9/12 >>   CULTURES: None  ANTIBIOTICS: None   SUBJECTIVE:   VITAL SIGNS: Temp:  [98.1 F (36.7 C)-99.7 F (37.6 C)] 98.1 F (36.7 C) (09/13 1600) Pulse Rate:  [50-86] 67 (09/13 1800) Resp:  [11-24] 11 (09/13 1800) BP: (108-213)/(27-151) 182/61 mmHg (09/13 1800) SpO2:  [96 %-100 %] 97 % (09/13 1800) FiO2 (%):  [30 %-35 %] 30 % (09/13 1817) Weight:  [75.3 kg (166 lb 0.1 oz)] 75.3 kg (166 lb 0.1 oz) (09/13 0406) HEMODYNAMICS:   VENTILATOR SETTINGS: Vent Mode:  [-] PRVC FiO2 (%):  [30 %-35 %] 30 % Set Rate:  [12 bmp] 12 bmp Vt Set:  [350 mL] 350 mL PEEP:  [5 cmH20] 5 cmH20 Pressure Support:  [5 cmH20] 5 cmH20 Plateau Pressure:  [11 cmH20-16 cmH20] 13 cmH20 INTAKE / OUTPUT: Intake/Output     09/12 0701 - 09/13 0700 09/13 0701 - 09/14 0700   I.V. (mL/kg) 2687.3 (35.7) 1168.6 (15.5)   IV Piggyback 100 50   Total Intake(mL/kg) 2787.3 (37) 1218.6 (16.2)   Urine (mL/kg/hr) 1660 875 (1)   Total Output 1660 875   Net +1127.3 +343.6          PHYSICAL EXAMINATION: General:  No distress Neuro:  Follows commands, moves extremities HEENT:  Tongue swelling improved, ETT in place Cardiovascular:  Regular, no murmur Lungs:  No wheeze/rales Abdomen:  Soft, non tender Musculoskeletal:  No edema Skin:  No rashes  LABS:  CBC Recent Labs     04/03/13  0828  04/04/13  0335  WBC  16.6*   14.9*  HGB  14.2  12.5  HCT  42.5  36.8  PLT  291  254   Coag's No results found for this basename: APTT, INR,  in the last 72 hours  BMET Recent Labs     04/03/13  0828  04/04/13  0335  NA  138  140  K  3.9  3.5  CL  99  103  CO2  29  27  BUN  24*  19  CREATININE  0.94  0.76  GLUCOSE  151*  180*   Electrolytes Recent Labs     04/03/13  0828  04/04/13  0335  CALCIUM  9.8  9.3   Sepsis Markers No results found for this basename: LACTICACIDVEN, PROCALCITON, O2SATVEN,  in the last 72 hours  ABG No results found for this basename: PHART, PCO2ART, PO2ART,  in the last 72 hours  Liver Enzymes No results found for this basename: AST, ALT, ALKPHOS, BILITOT, ALBUMIN,  in the last 72 hours  Cardiac Enzymes No results found for this basename: TROPONINI, PROBNP,  in the last 72 hours  Glucose Recent Labs     04/03/13  2001  04/03/13  2350  04/04/13  0335  04/04/13  0757  04/04/13  1229  04/04/13  1548  GLUCAP  181*  140*  153*  185*  159*  144*    Imaging Dg Chest Port 1 View  04/04/2013   CLINICAL DATA:  FOLLOW UP atelectasis and endotracheal tube position.  EXAM: PORTABLE CHEST - 1 VIEW  COMPARISON:  04/03/2013  FINDINGS: Endotracheal tube ends in the mid thoracic trachea. New OGT ends in the stomach.  Bandlike opacities in the lower lungs persist. No evidence of pneumothorax. Chronic cardiomegaly. Aortic atherosclerosis.  IMPRESSION: 1. New orogastric tube in good position. 2. Endotracheal tube ends in the mid thoracic trachea. 3. Persistent lower lung atelectasis bilaterally.   Electronically Signed   By: Tiburcio Pea   On: 04/04/2013 06:46   Dg Chest Port 1 View  04/03/2013   CLINICAL DATA:  Endotracheal tube placement  EXAM: PORTABLE CHEST - 1 VIEW  COMPARISON:  05/20/2010  FINDINGS: Endotracheal tube tip lies 5.2 cm above the Carina. Consider inserting an additional 2-3 cm for more optimal positioning.  The heart is normal in size and configuration. The  mediastinum is normal in contour. There are no hilar masses.  Mild medial lung base atelectasis. The lungs are otherwise clear. No pleural effusion or pneumothorax.  IMPRESSION: Endotracheal tube tip 5.2 cm above the Carina.  No acute cardiopulmonary disease.   Electronically Signed   By: Amie Portland   On: 04/03/2013 12:45   Dg Abd Portable 1v  04/03/2013   *RADIOLOGY REPORT*  Clinical Data: Nasogastric tube placement.  PORTABLE ABDOMEN - 1 VIEW  Comparison: None.  Findings: Nasogastric tube tip gastric fundus.  The side-hole is 2.5 cm beyond the gastroesophageal junction.  Gas filled dilated small and normal size large bowel with small bowel loops measuring up to 3.2 cm and large bowel measuring up to 6.9 cm.  This may represent an ileus.  Distal obstructing lesion not excluded.  The possibility of free intraperitoneal air cannot be addressed on a supine view.  IMPRESSION: Nasogastric tube tip gastric fundus.  The side-hole is 2.5 cm beyond the gastroesophageal junction.  Gas filled dilated small and normal size large bowel with small bowel loops measuring up to 3.2 cm and large bowel measuring up to 6.9 cm.  This may represent an ileus.  Distal obstructing lesion not excluded.   Original Report Authenticated By: Lacy Duverney, M.D.     ASSESSMENT / PLAN:  PULMONARY A: Acute respiratory failure 2nd to angioedema. Mild atelectasis P:   -needs airway not vent >> can wean on pressure support as tolerated -extubate when edema resolved -continue decadron, benadryl, pepcid  CARDIOVASCULAR A:  Hx of HTN, hyperlipidemia. P:  -prn labetalol IV for SBP > 170 -hold outpt ASA, fish oil, dyazide, calan  RENAL A:   No issues. P:   -continue IV fluids -monitor renal fx, urine outpt, electrolytes  GASTROINTESTINAL A:   Nutrition. P:   -add tube feeds if unable to extubate soon -pepcid for SUP  HEMATOLOGIC A:   No issues. P:  -f/u CBC -SQ heparin for DVT prevention  INFECTIOUS A:    Was on clindamycin as outpt >> unclear reason why.  No clinical evidence for infection at present P:   -monitor clincally  ENDOCRINE A:   DM type II with steroid induced hyperglycemia. Hypothyroidism. P:   -SSI -levothyroxine IV until able to take enteral medications  NEUROLOGIC A:   Sedation. Hx of anxiety. P:   -prn sedation while on vent  TODAY'S SUMMARY:     Billy Fischer, MD ; [facility] service [place] [phone].  After 5:30 PM or  weekends, call [phone]

## 2013-04-05 DIAGNOSIS — E119 Type 2 diabetes mellitus without complications: Secondary | ICD-10-CM

## 2013-04-05 LAB — GLUCOSE, CAPILLARY
Glucose-Capillary: 143 mg/dL — ABNORMAL HIGH (ref 70–99)
Glucose-Capillary: 138 mg/dL — ABNORMAL HIGH (ref 70–99)
Glucose-Capillary: 102 mg/dL — ABNORMAL HIGH (ref 70–99)

## 2013-04-05 MED ORDER — FAMOTIDINE 20 MG PO TABS
20.0000 mg | ORAL_TABLET | Freq: Two times a day (BID) | ORAL | Status: DC
  Administered 2013-04-05 – 2013-04-06 (×3): 20 mg via ORAL
  Filled 2013-04-05 (×4): qty 1

## 2013-04-05 MED ORDER — DIPHENHYDRAMINE HCL 25 MG PO CAPS
25.0000 mg | ORAL_CAPSULE | Freq: Four times a day (QID) | ORAL | Status: DC
  Administered 2013-04-05 – 2013-04-06 (×4): 25 mg via ORAL
  Filled 2013-04-05 (×8): qty 1

## 2013-04-05 MED ORDER — METFORMIN HCL 500 MG PO TABS
500.0000 mg | ORAL_TABLET | Freq: Two times a day (BID) | ORAL | Status: DC
  Administered 2013-04-05 – 2013-04-06 (×2): 500 mg via ORAL
  Filled 2013-04-05 (×4): qty 1

## 2013-04-05 NOTE — Progress Notes (Signed)
PULMONARY  / CRITICAL CARE MEDICINE  Name: Jo Larson MRN: [id number] DOB: [birth year]    ADMISSION DATE:  04/03/2013 CONSULTATION DATE:  04/03/2013  REFERRING MD : Christia Reading  CHIEF COMPLAINT:  Tongue swelling  BRIEF PATIENT DESCRIPTION:  79 yo female started on clindamycin as outpt (unknown reason) and developed tongue swelling and difficulty breathing 1 hour after taking Abx.  Had similar event 1 year prior.  Required intubation by ENT in OR.  PCCM asked to assist with vent management.  SIGNIFICANT EVENTS: 9/12 Angioedema, intubated in OR by ENT  STUDIES:   LINES / TUBES: ETT 9/12 >> 9/14  CULTURES: None  ANTIBIOTICS: None   SUBJECTIVE:  Oropharyngeal edema resolved. RASS 0. Passed SBT. Extubated and looks good without distress  VITAL SIGNS: Temp:  [98.1 F (36.7 C)-99.5 F (37.5 C)] 98.7 F (37.1 C) (09/14 1200) Pulse Rate:  [50-78] 72 (09/14 1158) Resp:  [0-22] 16 (09/14 1158) BP: (108-225)/(27-70) 174/55 mmHg (09/14 1158) SpO2:  [95 %-97 %] 95 % (09/14 1158) FiO2 (%):  [30 %] 30 % (09/14 0740) Weight:  [76.7 kg (169 lb 1.5 oz)] 76.7 kg (169 lb 1.5 oz) (09/14 0400) HEMODYNAMICS:   VENTILATOR SETTINGS: Vent Mode:  [-] CPAP FiO2 (%):  [30 %] 30 % Set Rate:  [12 bmp] 12 bmp Vt Set:  [350 mL] 350 mL PEEP:  [5 cmH20] 5 cmH20 Pressure Support:  [5 cmH20] 5 cmH20 Plateau Pressure:  [12 cmH20-14 cmH20] 14 cmH20 INTAKE / OUTPUT: Intake/Output     09/13 0701 - 09/14 0700 09/14 0701 - 09/15 0700   P.O.  320   I.V. (mL/kg) 2605.8 (34) 264.8 (3.5)   IV Piggyback 100    Total Intake(mL/kg) 2705.8 (35.3) 584.8 (7.6)   Urine (mL/kg/hr) 2020 (1.1) 650 (1.5)   Total Output 2020 650   Net +685.8 -65.2          PHYSICAL EXAMINATION: General:  No distress Neuro:  Follows commands, moves extremities HEENT:  WNL Cardiovascular:  Regular, no murmur Lungs:  No wheeze/rales Abdomen:  Soft, non tender Musculoskeletal:  No edema Skin:  No  rashes  LABS:  CBC Recent Labs     04/03/13  0828  04/04/13  0335  WBC  16.6*  14.9*  HGB  14.2  12.5  HCT  42.5  36.8  PLT  291  254   Coag's No results found for this basename: APTT, INR,  in the last 72 hours  BMET Recent Labs     04/03/13  0828  04/04/13  0335  NA  138  140  K  3.9  3.5  CL  99  103  CO2  29  27  BUN  24*  19  CREATININE  0.94  0.76  GLUCOSE  151*  180*   Electrolytes Recent Labs     04/03/13  0828  04/04/13  0335  CALCIUM  9.8  9.3   Sepsis Markers No results found for this basename: LACTICACIDVEN, PROCALCITON, O2SATVEN,  in the last 72 hours  ABG No results found for this basename: PHART, PCO2ART, PO2ART,  in the last 72 hours  Liver Enzymes No results found for this basename: AST, ALT, ALKPHOS, BILITOT, ALBUMIN,  in the last 72 hours  Cardiac Enzymes No results found for this basename: TROPONINI, PROBNP,  in the last 72 hours  Glucose Recent Labs     04/04/13  0757  04/04/13  1229  04/04/13  1548  04/04/13  2313  04/05/13  0335  04/05/13  0804  GLUCAP  185*  159*  144*  173*  143*  138*    Imaging No new CXR   ASSESSMENT / PLAN:  PULMONARY A: Acute respiratory failure 2nd to angioedema. Mild atelectasis P:   Monitor in ICU post extubation Mobilize  CARDIOVASCULAR A:  Hx of HTN, hyperlipidemia. P:  Cont current Rx  RENAL A:   No issues. P:   Monitor  GASTROINTESTINAL A:   Nutrition. P:   Begin diet   HEMATOLOGIC A:   No issues. P:  -f/u CBC -SQ heparin for DVT prevention  INFECTIOUS A:   No issues P:   -monitor   ENDOCRINE A:   DM type II with steroid induced hyperglycemia. Hypothyroidism. P:   Cont SSI Can change levothyroxine to PO 9/15  NEUROLOGIC A:   Sedation. Hx of anxiety. P:   D/C sedation protocol  TODAY'S SUMMARY:     Billy Fischer, MD ; [facility] service Mobile [phone].  After 5:30 PM or weekends, call [phone]

## 2013-04-05 NOTE — Progress Notes (Signed)
2 Days Post-Op  Subjective: Remains stable on ventilator.  No specific complaints or changes.  Objective: Vital signs in last 24 hours: Temp:  [98.1 F (36.7 C)-99.5 F (37.5 C)] 99.5 F (37.5 C) (09/14 0400) Pulse Rate:  [50-78] 50 (09/14 0600) Resp:  [0-22] 0 (09/14 0600) BP: (108-190)/(27-124) 144/46 mmHg (09/14 0600) SpO2:  [95 %-97 %] 96 % (09/14 0600) FiO2 (%):  [30 %] 30 % (09/14 0740) Weight:  [76.7 kg (169 lb 1.5 oz)] 76.7 kg (169 lb 1.5 oz) (09/14 0400)    Intake/Output from previous day: 09/13 0701 - 09/14 0700 In: 2705.8 [I.V.:2605.8; IV Piggyback:100] Out: 2020 [Urine:2020] Intake/Output this shift:    General appearance: alert, cooperative, no distress and transorally intubated Throat: floor of mouth edema nearly completely resolved, tongue normal, good air leak around tube with cuff deflated  Lab Results:   Recent Labs  04/03/13 0828 04/04/13 0335  WBC 16.6* 14.9*  HGB 14.2 12.5  HCT 42.5 36.8  PLT 291 254   BMET  Recent Labs  04/03/13 0828 04/04/13 0335  NA 138 140  K 3.9 3.5  CL 99 103  CO2 29 27  GLUCOSE 151* 180*  BUN 24* 19  CREATININE 0.94 0.76  CALCIUM 9.8 9.3   PT/INR No results found for this basename: LABPROT, INR,  in the last 72 hours ABG No results found for this basename: PHART, PCO2, PO2, HCO3,  in the last 72 hours  Studies/Results: Dg Chest Port 1 View  04/04/2013   CLINICAL DATA:  FOLLOW UP atelectasis and endotracheal tube position.  EXAM: PORTABLE CHEST - 1 VIEW  COMPARISON:  04/03/2013  FINDINGS: Endotracheal tube ends in the mid thoracic trachea. New OGT ends in the stomach.  Bandlike opacities in the lower lungs persist. No evidence of pneumothorax. Chronic cardiomegaly. Aortic atherosclerosis.  IMPRESSION: 1. New orogastric tube in good position. 2. Endotracheal tube ends in the mid thoracic trachea. 3. Persistent lower lung atelectasis bilaterally.   Electronically Signed   By: Tiburcio Pea   On: 04/04/2013 06:46    Dg Chest Port 1 View  04/03/2013   CLINICAL DATA:  Endotracheal tube placement  EXAM: PORTABLE CHEST - 1 VIEW  COMPARISON:  05/20/2010  FINDINGS: Endotracheal tube tip lies 5.2 cm above the Carina. Consider inserting an additional 2-3 cm for more optimal positioning.  The heart is normal in size and configuration. The mediastinum is normal in contour. There are no hilar masses.  Mild medial lung base atelectasis. The lungs are otherwise clear. No pleural effusion or pneumothorax.  IMPRESSION: Endotracheal tube tip 5.2 cm above the Carina.  No acute cardiopulmonary disease.   Electronically Signed   By: Amie Portland   On: 04/03/2013 12:45   Dg Abd Portable 1v  04/03/2013   *RADIOLOGY REPORT*  Clinical Data: Nasogastric tube placement.  PORTABLE ABDOMEN - 1 VIEW  Comparison: None.  Findings: Nasogastric tube tip gastric fundus.  The side-hole is 2.5 cm beyond the gastroesophageal junction.  Gas filled dilated small and normal size large bowel with small bowel loops measuring up to 3.2 cm and large bowel measuring up to 6.9 cm.  This may represent an ileus.  Distal obstructing lesion not excluded.  The possibility of free intraperitoneal air cannot be addressed on a supine view.  IMPRESSION: Nasogastric tube tip gastric fundus.  The side-hole is 2.5 cm beyond the gastroesophageal junction.  Gas filled dilated small and normal size large bowel with small bowel loops measuring up to 3.2  cm and large bowel measuring up to 6.9 cm.  This may represent an ileus.  Distal obstructing lesion not excluded.   Original Report Authenticated By: Lacy Duverney, M.D.    Anti-infectives: Anti-infectives   None      Assessment/Plan: Angioedema Patient extubated successfully.  No stridor.  Mild hoarseness. Breathing comfortably.  Observe one more night in ICU setting but will d/c foley, ambulate with assistance, and advance diet.  LOS: 2 days    Cristel Rail 04/05/2013

## 2013-04-05 NOTE — Procedures (Signed)
Extubation Procedure Note  Patient Details:   Name: Jo Larson DOB: [birth year] MRN: [id number]   Airway Documentation:     Evaluation  O2 sats: 95 Complications: none Patient tolerated procedure well. Bilateral Breath Sounds: Clear Suctioning: Airway Pt able to speak  Per MD order, extubated pt.  Pt was stable throughout the procedure. Placed on 2L nasal cannula.  Revonda Humphrey 04/05/2013, 8:51 AM

## 2013-04-06 ENCOUNTER — Encounter (HOSPITAL_COMMUNITY): Payer: Self-pay | Admitting: Otolaryngology

## 2013-04-06 LAB — GLUCOSE, CAPILLARY: Glucose-Capillary: 169 mg/dL — ABNORMAL HIGH (ref 70–99)

## 2013-04-06 MED ORDER — FAMOTIDINE 20 MG PO TABS: 20.0000 mg | ORAL_TABLET | Freq: Two times a day (BID) | ORAL | Status: DC

## 2013-04-06 MED ORDER — LEVOTHYROXINE SODIUM 50 MCG PO TABS
50.0000 ug | ORAL_TABLET | Freq: Every day | ORAL | Status: DC
  Administered 2013-04-06: 50 ug via ORAL
  Filled 2013-04-06 (×2): qty 1

## 2013-04-06 MED ORDER — DIPHENHYDRAMINE HCL 25 MG PO CAPS: 25.0000 mg | ORAL_CAPSULE | Freq: Four times a day (QID) | ORAL | Status: DC

## 2013-04-06 NOTE — Discharge Summary (Signed)
Physician Discharge Summary  Patient ID: Jo Larson MRN: [id number] DOB/AGE: May 28, 1932 79 y.o.  Admit date: 04/03/2013 Discharge date: 04/06/2013  Admission Diagnoses: Angioedema  Discharge Diagnoses:  Principal Problem:   Angioedema Active Problems:   HYPOTHYROIDISM   DM, UNCOMPLICATED, TYPE II   HYPERTENSION, BENIGN ESSENTIAL   Acute respiratory failure   Discharged Condition: good  Hospital Course: 79 year old female admitted on 9/12 with acute onset of edema of the floor of mouth and supraglottis.  She was taken from the ER to the OR for awake intubation due to supraglottic edema.  She was observed in the ICU on mechanical ventilation and treated with steroids and H1 and H2 blockers and did quite well.  The following day, edema improved but persisted to some degree and she did not have a very robust cuff leak.  After one more day, edema had largely resolved in the mouth and she had a better cuff leak so she was extubated.  She was observed one more night and had no problems with breathing or swallowing.  She is felt stable for discharge.  Consults: pulmonary/intensive care  Significant Diagnostic Studies: None  Treatments: respiratory therapy: mechanical ventilation  Discharge Exam: Blood pressure 167/64, pulse 60, temperature 98.1 F (36.7 C), temperature source Oral, resp. rate 18, height 4\' 11"  (1.499 m), weight 76.1 kg (167 lb 12.3 oz), SpO2 93.00%. General appearance: alert, cooperative and no distress Throat: lips, mucosa, and tongue normal; teeth and gums normal and no stridor  Disposition:   Discharge Orders   Future Orders Complete By Expires   Diet - low sodium heart healthy  As directed    Discharge instructions  As directed    Comments:     Resume normal diet and activity.   Increase activity slowly  As directed        Medication List    STOP taking these medications       clindamycin 300 MG capsule  Commonly known as:  CLEOCIN      TAKE these  medications       aspirin 325 MG EC tablet  Take 325 mg by mouth daily.     cholecalciferol 1000 UNITS tablet  Commonly known as:  VITAMIN D  Take 1,000 Units by mouth daily.     diphenhydrAMINE 25 mg capsule  Commonly known as:  BENADRYL  Take 1 capsule (25 mg total) by mouth every 6 (six) hours.     esomeprazole 40 MG capsule  Commonly known as:  NEXIUM  Take 40 mg by mouth daily before breakfast.     famotidine 20 MG tablet  Commonly known as:  PEPCID  Take 1 tablet (20 mg total) by mouth 2 (two) times daily.     fish oil-omega-3 fatty acids 1000 MG capsule  Take 1,200 mg by mouth 2 (two) times daily.     levothyroxine 50 MCG tablet  Commonly known as:  SYNTHROID, LEVOTHROID  Take 50 mcg by mouth daily before breakfast.     LORazepam 1 MG tablet  Commonly known as:  ATIVAN  Take 1 mg by mouth 2 (two) times daily.     metFORMIN 500 MG tablet  Commonly known as:  GLUCOPHAGE  Take 500 mg by mouth 2 (two) times daily with a meal.     triamterene-hydrochlorothiazide 37.5-25 MG per capsule  Commonly known as:  DYAZIDE  Take 1 capsule by mouth daily.     verapamil 240 MG CR tablet  Commonly known as:  CALAN-SR  Take 240 mg by mouth daily.           Follow-up Information   Follow up with Cathleen Yagi, MD. Schedule an appointment as soon as possible for a visit in 1 week.   Specialty:  Otolaryngology   Contact information:   [address] [place] [address] [phone]       Signed: Christia Reading 04/06/2013, 8:45 AM

## 2013-06-03 ENCOUNTER — Inpatient Hospital Stay (HOSPITAL_COMMUNITY): Payer: PRIVATE HEALTH INSURANCE

## 2013-06-03 ENCOUNTER — Inpatient Hospital Stay (HOSPITAL_COMMUNITY)
Admission: EM | Admit: 2013-06-03 | Discharge: 2013-06-05 | DRG: 916 | Disposition: A | Discharge status: Home / Self Care | Payer: PRIVATE HEALTH INSURANCE | Attending: Pulmonary Disease | Admitting: Pulmonary Disease

## 2013-06-03 ENCOUNTER — Encounter (HOSPITAL_COMMUNITY): Payer: Self-pay | Admitting: Emergency Medicine

## 2013-06-03 DIAGNOSIS — J9600 Acute respiratory failure, unspecified whether with hypoxia or hypercapnia: Secondary | ICD-10-CM

## 2013-06-03 DIAGNOSIS — Z7982 Long term (current) use of aspirin: Secondary | ICD-10-CM

## 2013-06-03 DIAGNOSIS — E039 Hypothyroidism, unspecified: Secondary | ICD-10-CM | POA: Diagnosis present

## 2013-06-03 DIAGNOSIS — I10 Essential (primary) hypertension: Secondary | ICD-10-CM | POA: Diagnosis present

## 2013-06-03 DIAGNOSIS — E119 Type 2 diabetes mellitus without complications: Secondary | ICD-10-CM | POA: Diagnosis present

## 2013-06-03 DIAGNOSIS — Z79899 Other long term (current) drug therapy: Secondary | ICD-10-CM

## 2013-06-03 DIAGNOSIS — E559 Vitamin D deficiency, unspecified: Secondary | ICD-10-CM | POA: Diagnosis present

## 2013-06-03 DIAGNOSIS — T783XXA Angioneurotic edema, initial encounter: Principal | ICD-10-CM

## 2013-06-03 DIAGNOSIS — T783XXD Angioneurotic edema, subsequent encounter: Secondary | ICD-10-CM

## 2013-06-03 DIAGNOSIS — E785 Hyperlipidemia, unspecified: Secondary | ICD-10-CM | POA: Diagnosis present

## 2013-06-03 DIAGNOSIS — F411 Generalized anxiety disorder: Secondary | ICD-10-CM | POA: Diagnosis present

## 2013-06-03 DIAGNOSIS — R82998 Other abnormal findings in urine: Secondary | ICD-10-CM

## 2013-06-03 DIAGNOSIS — R011 Cardiac murmur, unspecified: Secondary | ICD-10-CM | POA: Diagnosis present

## 2013-06-03 DIAGNOSIS — G4700 Insomnia, unspecified: Secondary | ICD-10-CM

## 2013-06-03 DIAGNOSIS — M1990 Unspecified osteoarthritis, unspecified site: Secondary | ICD-10-CM

## 2013-06-03 DIAGNOSIS — X58XXXA Exposure to other specified factors, initial encounter: Secondary | ICD-10-CM | POA: Diagnosis present

## 2013-06-03 DIAGNOSIS — T783XXS Angioneurotic edema, sequela: Secondary | ICD-10-CM

## 2013-06-03 DIAGNOSIS — E079 Disorder of thyroid, unspecified: Secondary | ICD-10-CM

## 2013-06-03 DIAGNOSIS — K219 Gastro-esophageal reflux disease without esophagitis: Secondary | ICD-10-CM | POA: Diagnosis present

## 2013-06-03 HISTORY — DX: Angioneurotic edema, initial encounter: T78.3XXA

## 2013-06-03 LAB — CBC
HCT: 38.3 % (ref 36.0–46.0)
Hemoglobin: 12.9 g/dL (ref 12.0–15.0)
MCV: 88.5 fL (ref 78.0–100.0)
Platelets: 272 10*3/uL (ref 150–400)
RBC: 4.33 MIL/uL (ref 3.87–5.11)
WBC: 11.2 10*3/uL — ABNORMAL HIGH (ref 4.0–10.5)

## 2013-06-03 LAB — COMPREHENSIVE METABOLIC PANEL
ALT: 10 U/L (ref 0–35)
AST: 17 U/L (ref 0–37)
Albumin: 3.4 g/dL — ABNORMAL LOW (ref 3.5–5.2)
Alkaline Phosphatase: 61 U/L (ref 39–117)
BUN: 24 mg/dL — ABNORMAL HIGH (ref 6–23)
Chloride: 102 mEq/L (ref 96–112)
GFR calc Af Amer: 78 mL/min — ABNORMAL LOW (ref >90)
Potassium: 3.8 mEq/L (ref 3.5–5.1)
Sodium: 137 mEq/L (ref 135–145)
Total Protein: 7.2 g/dL (ref 6.0–8.3)

## 2013-06-03 LAB — MAGNESIUM: Magnesium: 1.7 mg/dL (ref 1.5–2.5)

## 2013-06-03 LAB — SEDIMENTATION RATE: Sed Rate: 35 mm/hr — ABNORMAL HIGH (ref 0–22)

## 2013-06-03 MED ORDER — FAMOTIDINE IN NACL 20-0.9 MG/50ML-% IV SOLN
20.0000 mg | Freq: Two times a day (BID) | INTRAVENOUS | Status: DC
  Administered 2013-06-03 – 2013-06-04 (×2): 20 mg via INTRAVENOUS
  Filled 2013-06-03 (×3): qty 50

## 2013-06-03 MED ORDER — HEPARIN SODIUM (PORCINE) 5000 UNIT/ML IJ SOLN
5000.0000 [IU] | Freq: Three times a day (TID) | INTRAMUSCULAR | Status: DC
Start: 2013-06-03 — End: 2013-06-05
  Administered 2013-06-03 – 2013-06-05 (×7): 5000 [IU] via SUBCUTANEOUS
  Filled 2013-06-03 (×10): qty 1

## 2013-06-03 MED ORDER — LORAZEPAM 1 MG PO TABS
1.0000 mg | ORAL_TABLET | Freq: Every day | ORAL | Status: DC
  Administered 2013-06-03 – 2013-06-04 (×2): 1 mg via ORAL
  Filled 2013-06-03 (×2): qty 1

## 2013-06-03 MED ORDER — DIPHENHYDRAMINE HCL 50 MG/ML IJ SOLN
25.0000 mg | Freq: Once | INTRAMUSCULAR | Status: AC
  Administered 2013-06-03: 25 mg via INTRAVENOUS
  Filled 2013-06-03: qty 1

## 2013-06-03 MED ORDER — PANTOPRAZOLE SODIUM 40 MG IV SOLR
40.0000 mg | Freq: Every day | INTRAVENOUS | Status: DC
  Administered 2013-06-03: 40 mg via INTRAVENOUS
  Filled 2013-06-03 (×2): qty 40

## 2013-06-03 MED ORDER — METHYLPREDNISOLONE SODIUM SUCC 125 MG IJ SOLR
60.0000 mg | Freq: Four times a day (QID) | INTRAMUSCULAR | Status: DC
  Administered 2013-06-03 – 2013-06-04 (×3): 60 mg via INTRAVENOUS
  Filled 2013-06-03 (×6): qty 0.96

## 2013-06-03 MED ORDER — EPINEPHRINE 0.3 MG/0.3ML IJ SOAJ
0.3000 mg | INTRAMUSCULAR | Status: DC | PRN
  Filled 2013-06-03: qty 0.6

## 2013-06-03 MED ORDER — DIPHENHYDRAMINE HCL 50 MG/ML IJ SOLN
25.0000 mg | Freq: Two times a day (BID) | INTRAMUSCULAR | Status: DC
  Administered 2013-06-03 – 2013-06-04 (×3): 25 mg via INTRAVENOUS
  Filled 2013-06-03 (×2): qty 0.5
  Filled 2013-06-03: qty 1
  Filled 2013-06-03: qty 0.5

## 2013-06-03 MED ORDER — FAMOTIDINE IN NACL 20-0.9 MG/50ML-% IV SOLN
20.0000 mg | Freq: Once | INTRAVENOUS | Status: AC
  Administered 2013-06-03: 20 mg via INTRAVENOUS
  Filled 2013-06-03: qty 50

## 2013-06-03 MED ORDER — DEXAMETHASONE SODIUM PHOSPHATE 10 MG/ML IJ SOLN
10.0000 mg | Freq: Once | INTRAMUSCULAR | Status: AC
  Administered 2013-06-03: 10 mg via INTRAVENOUS
  Filled 2013-06-03 (×2): qty 1

## 2013-06-03 MED ORDER — SODIUM CHLORIDE 0.9 % IV SOLN
INTRAVENOUS | Status: DC
  Administered 2013-06-03 – 2013-06-05 (×3): via INTRAVENOUS

## 2013-06-03 NOTE — ED Provider Notes (Signed)
CSN: [id number]     Arrival date & time 06/03/13  8295 History   First MD Initiated Contact with Patient 06/03/13 [phone]     Chief Complaint  Patient presents with  . Angioedema   HPI  Patient presents with swelling floor of her mouth. She is documented history of angioedema. Had an episode over a year ago. This is thought to be secondary to clindamycin. In September of this year again had another episode. When her symptoms started was given a dose of clindamycin by her daughter. Presented to the emergency room. At that time had a planned OR intubation by ENT secondary to supraglottic edema. This was done by Dr. Jenne Pane. Saw Dr. Beaulah Dinning of Allergy/Immunology on October 8. He was given prescriptions for Claritin. Given information about nut allergies.  Today awakened this morning feeling normal. By 8:00 as having a feeling of something under her tongue. She presents here with swelling of the floor of her mouth elevation of her tongue. She was able to drink some milk. She's not had solid food. She's not been dyspneic. Has had no stridor. No drooling.  Past Medical History  Diagnosis Date  . Diabetes mellitus without complication   . Hypertension   . Anxiety   . Hypothyroidism   . Angioedema    Past Surgical History  Procedure Laterality Date  . Intubation nasotracheal N/A 04/03/2013    Procedure: FIBEROPTIC NASAL-TRACHEAL INTUBATION;  Surgeon: Christia Reading, MD;  Location: [facility] ORS;  Service: ENT;  Laterality: N/A;   No family history on file. History  Substance Use Topics  . Smoking status: Never Smoker   . Smokeless tobacco: Never Used  . Alcohol Use: No   OB History   Grav Para Term Preterm Abortions TAB SAB Ect Mult Living                 Review of Systems  Constitutional: Negative for fever, chills, diaphoresis, appetite change and fatigue.  HENT: Negative for drooling, mouth sores, sore throat and trouble swallowing.        Sublingual swelling  Eyes: Negative for visual  disturbance.  Respiratory: Negative for cough, chest tightness, shortness of breath and wheezing.   Cardiovascular: Negative for chest pain.  Gastrointestinal: Negative for nausea, vomiting, abdominal pain, diarrhea and abdominal distention.  Endocrine: Negative for polydipsia, polyphagia and polyuria.  Genitourinary: Negative for dysuria, frequency and hematuria.  Musculoskeletal: Negative for gait problem.  Skin: Negative for color change, pallor and rash.  Neurological: Negative for dizziness, syncope, light-headedness and headaches.  Hematological: Does not bruise/bleed easily.  Psychiatric/Behavioral: Negative for behavioral problems and confusion.    Allergies  Clindamycin/lincomycin  Home Medications   Current Outpatient Rx  Name  Route  Sig  Dispense  Refill  . aspirin 325 MG EC tablet   Oral   Take 325 mg by mouth daily.         . cholecalciferol (VITAMIN D) 1000 UNITS tablet   Oral   Take 1,000 Units by mouth daily.         Marland Kitchen esomeprazole (NEXIUM) 40 MG capsule   Oral   Take 40 mg by mouth daily before breakfast.         . fish oil-omega-3 fatty acids 1000 MG capsule   Oral   Take 1 g by mouth 2 (two) times daily.          Marland Kitchen levothyroxine (SYNTHROID, LEVOTHROID) 50 MCG tablet   Oral   Take 50 mcg by mouth daily before  breakfast.         . loratadine (CLARITIN) 10 MG tablet   Oral   Take 10 mg by mouth daily.         Marland Kitchen LORazepam (ATIVAN) 1 MG tablet   Oral   Take 1 mg by mouth 2 (two) times daily.         . metFORMIN (GLUCOPHAGE) 500 MG tablet   Oral   Take 500 mg by mouth 2 (two) times daily with a meal.         . OVER THE COUNTER MEDICATION      1 patch as needed (arthritis pain). OTC arthritis pain patch, used as needed on shoulder.         . triamterene-hydrochlorothiazide (DYAZIDE) 37.5-25 MG per capsule   Oral   Take 1 capsule by mouth daily.         . verapamil (CALAN-SR) 240 MG CR tablet   Oral   Take 240 mg by mouth  daily.          BP 138/62  Pulse 74  Temp(Src) 98.3 F (36.8 C) (Oral)  Resp 20  SpO2 97% Physical Exam  Constitutional: She is oriented to person, place, and time. She appears well-developed and well-nourished. No distress.  She does not appear distressed. She is laying semirecumbent. She does not appear apprehensive. She is definitely not tripoding.  HENT:  Head: Normocephalic.  Has sublingual swelling throughout the entirety of the floor of the mouth. Her tongue is elevated. I can visualize her posterior pharynx. She is not drooling. Her tongue does not protrude.  Eyes: Conjunctivae are normal. Pupils are equal, round, and reactive to light. No scleral icterus.  Neck: Normal range of motion. Neck supple. No thyromegaly present.  Cardiovascular: Normal rate and regular rhythm.  Exam reveals no gallop and no friction rub.   No murmur heard. Pulmonary/Chest: Effort normal and breath sounds normal. No respiratory distress. She has no wheezes. She has no rales.  Abdominal: Soft. Bowel sounds are normal. She exhibits no distension. There is no tenderness. There is no rebound.  Musculoskeletal: Normal range of motion.  Neurological: She is alert and oriented to person, place, and time.  Skin: Skin is warm and dry. No rash noted.  Psychiatric: She has a normal mood and affect. Her behavior is normal.    ED Course  Procedures (including critical care time) Labs Review Labs Reviewed - No data to display Imaging Review No results found.  EKG Interpretation   None       MDM   1. Angioedema, initial encounter    Over 2 hours in the emergency room her exam has not progressed. She continues to not be stridorous or drooling. She is still able to lay supine without being apprehensive. Discussed case with Dr. Annalee Genta ENT. Dr. Annalee Genta was in the operating room. Asked to see the patient consult as he is available. She does not need emergency airway management at this time she has not  progressed over 2 hours in the emergency room. I discussed the case with critical care. Dr., is here evaluating the patient. Plan will be admission to the ICU for careful observation and ENT evaluation.    Roney Marion, MD 06/03/13 1230

## 2013-06-03 NOTE — ED Notes (Signed)
Pt nonenglish speaking, daughter in law states pt has hx of same, swelling to tongue and mouth x50mounth, no respiratory difficulty.

## 2013-06-03 NOTE — Progress Notes (Signed)
eLink Physician-Brief Progress Note Patient Name: Jo Larson DOB: 1931/08/25 MRN: [id number]  Date of Service  06/03/2013   HPI/Events of Note  Routinely takes ativan at home.     eICU Interventions  See orders    Intervention Category Minor Interventions: Routine modifications to care plan (e.g. PRN medications for pain, fever)  Shan Levans 06/03/2013, 7:47 PM

## 2013-06-03 NOTE — ED Notes (Signed)
Per admitting MD, keep in ED until seen by ENT, then transfer to ICU

## 2013-06-03 NOTE — Progress Notes (Signed)
UR completed; Spoke with [facility] geologist

## 2013-06-03 NOTE — H&P (Signed)
PULMONARY  / CRITICAL CARE MEDICINE  Name: Jo Larson MRN: [id number] DOB: 1931/11/19    ADMISSION DATE:  06/03/2013   REFERRING MD :EDP PRIMARY SERVICE: PCCM  CHIEF COMPLAINT:  Tongue swelling  BRIEF PATIENT DESCRIPTION: 79 yo [name] who presents with recurrent Angio edema. She has a similar episode 1 year ago from abx with clindamycin listed as the offender & also in 03/2013 requiring intubation. Allergy evaluation did not identify any cause.  SIGNIFICANT EVENTS / STUDIES:  11-12 ENt eval >>  LINES / TUBES:   CULTURES: none  ANTIBIOTICS: none  HISTORY OF PRESENT ILLNESS:   79 yo [name] who presents with recurrent Angio edema. She has a similar episode 1 year ago from abx with clindamycin listed as the offender. She presents 11-12 early am having woken with swelling of her tongue and floor of her mouth. She is not in resp distress, no stridor, no progression of edema over 2 hour period in the ED. PCCM asked to admit for recurrent angioedema.  PAST MEDICAL HISTORY :  Past Medical History  Diagnosis Date  . Diabetes mellitus without complication   . Hypertension   . Anxiety   . Hypothyroidism   . Angioedema    Past Surgical History  Procedure Laterality Date  . Intubation nasotracheal N/A 04/03/2013    Procedure: FIBEROPTIC NASAL-TRACHEAL INTUBATION;  Surgeon: Christia Reading, MD;  Location: [facility];  Service: ENT;  Laterality: N/A;   Prior to Admission medications   Medication Sig Start Date End Date Taking? Authorizing Provider  aspirin 325 MG EC tablet Take 325 mg by mouth daily.   Yes Historical Provider, MD  cholecalciferol (VITAMIN D) 1000 UNITS tablet Take 1,000 Units by mouth daily.   Yes Historical Provider, MD  esomeprazole (NEXIUM) 40 MG capsule Take 40 mg by mouth daily before breakfast.   Yes Historical Provider, MD  fish oil-omega-3 fatty acids 1000 MG capsule Take 1 g by mouth 2 (two) times daily.    Yes Historical Provider, MD   levothyroxine (SYNTHROID, LEVOTHROID) 50 MCG tablet Take 50 mcg by mouth daily before breakfast.   Yes Historical Provider, MD  loratadine (CLARITIN) 10 MG tablet Take 10 mg by mouth daily.   Yes Historical Provider, MD  LORazepam (ATIVAN) 1 MG tablet Take 1 mg by mouth 2 (two) times daily.   Yes Historical Provider, MD  metFORMIN (GLUCOPHAGE) 500 MG tablet Take 500 mg by mouth 2 (two) times daily with a meal.   Yes Historical Provider, MD  OVER THE COUNTER MEDICATION 1 patch as needed (arthritis pain). OTC arthritis pain patch, used as needed on shoulder.   Yes Historical Provider, MD  triamterene-hydrochlorothiazide (DYAZIDE) 37.5-25 MG per capsule Take 1 capsule by mouth daily.   Yes Historical Provider, MD  verapamil (CALAN-SR) 240 MG CR tablet Take 240 mg by mouth daily.   Yes Historical Provider, MD   Allergies  Allergen Reactions  . Clindamycin/Lincomycin Swelling    Tongue swelling, angioedema    FAMILY HISTORY:  No family history on file. SOCIAL HISTORY:  reports that she has never smoked. She has never used smokeless tobacco. She reports that she does not drink alcohol. Her drug history is not on file.  REVIEW OF SYSTEMS:  \10 point review of system taken, please see HPI for positives and negatives. Via her daughter   SUBJECTIVE:  NAD VITAL SIGNS: Temp:  [98.3 F (36.8 C)] 98.3 F (36.8 C) (11/12 0929) Pulse Rate:  [74-82] 74 (11/12 1000) Resp:  [  20] 20 (11/12 0929) BP: (136-139)/(60-77) 138/62 mmHg (11/12 1000) SpO2:  [96 %-97 %] 97 % (11/12 1014) HEMODYNAMICS:   VENTILATOR SETTINGS:   INTAKE / OUTPUT: Intake/Output   None     PHYSICAL EXAMINATION: General:  WNWD [name] who speaks very little english.  Neuro:  INTACT HEENT:  ++lingual edema, no jvd, no LAN.Marland Kitchen Speech clear, no stridor Cardiovascular: HSR RRR Lungs:  CTA Abdomen: +BS, soft, non tender Musculoskeletal:  intact Skin: warm, without edema  LABS:  CBC No results found for this  basename: WBC, HGB, HCT, PLT,  in the last 168 hours Coag's No results found for this basename: APTT, INR,  in the last 168 hours BMET No results found for this basename: NA, K, CL, CO2, BUN, CREATININE, GLUCOSE,  in the last 168 hours Electrolytes No results found for this basename: CALCIUM, MG, PHOS,  in the last 168 hours Sepsis Markers No results found for this basename: LATICACIDVEN, PROCALCITON, O2SATVEN,  in the last 168 hours ABG No results found for this basename: PHART, PCO2ART, PO2ART,  in the last 168 hours Liver Enzymes No results found for this basename: AST, ALT, ALKPHOS, BILITOT, ALBUMIN,  in the last 168 hours Cardiac Enzymes No results found for this basename: TROPONINI, PROBNP,  in the last 168 hours Glucose No results found for this basename: GLUCAP,  in the last 168 hours  Imaging No results found.   CXR:   ASSESSMENT / PLAN:  PULMONARY A:  Recurrent angioedema with presumed Kale exposure P:   Steroids H1/ H2 blockers ICU admit ENT evaluation Unclear etiology - stop all meds , check c1 esterase inhibitor   CARDIOVASCULAR A:  No acute issue P: Stop aspirin Stop ca channel blocker, may need alternative agents if BP high    GASTROINTESTINAL A:  GI protection P:   H2 blocker    ENDOCRINE A:  DM2      Hypothyroidism P:   SSI    TODAY'S SUMMARY:  79 yo sl[place] female who presents with recurrent angioedema,without airway compromise. Suspected ingestion of kale as trigger - other etiologies include ASA or ca channel blocker. Await ENT input   Care during the described time interval was provided by me and/or other providers on the critical care team.  I have reviewed this patient's available data, including medical history, events of note, physical examination and test results as part of my evaluation  Cyril Mourning MD. FCCP. [facility] Pager [phone] If no response call [phone]   06/03/2013, 12:32 PM

## 2013-06-03 NOTE — ED Notes (Signed)
Per daughter pt has had tongue swelling for a month. Daughter reports Claritin as only new medication this past month. Unsure what is causing swelling. Pt denies SOB.

## 2013-06-03 NOTE — Consult Note (Signed)
ENT CONSULT:  Reason for Consult: Floor of Mouth Swelling Referring Physician: CCM  Jo Larson is an 79 y.o. female.  HPI: The patient presents to [facility] long hospital ER for evaluation of anterior floor of mouth and tongue swelling. She has a history of intermittent angioedema, initially treated 1 year ago with a recurrence in September 2014 requiring intubation. The patient is not on ACE inhibitors and etiology unclear, concerns raised regarding possible clindamycin reaction. The patient was admitted from the emergency department with symptoms of swelling and mild discomfort, no recent infection. No respiratory distress or stridor.  Past Medical History  Diagnosis Date  . Diabetes mellitus without complication   . Hypertension   . Anxiety   . Hypothyroidism   . Angioedema     Past Surgical History  Procedure Laterality Date  . Intubation nasotracheal N/A 04/03/2013    Procedure: FIBEROPTIC NASAL-TRACHEAL INTUBATION;  Surgeon: Christia Reading, MD;  Location: [facility];  Service: ENT;  Laterality: N/A;    No family history on file.  Social History:  reports that she has never smoked. She has never used smokeless tobacco. She reports that she does not drink alcohol. Her drug history is not on file.  Allergies:  Allergies  Allergen Reactions  . Clindamycin/Lincomycin Swelling    Tongue swelling, angioedema    Medications: I have reviewed the patient's current medications.  Results for orders placed during the hospital encounter of 06/03/13 (from the past 48 hour(s))  COMPREHENSIVE METABOLIC PANEL     Status: Abnormal   Collection Time    06/03/13  1:15 PM      Result Value Range   Sodium 137  135 - 145 mEq/L   Potassium 3.8  3.5 - 5.1 mEq/L   Chloride 102  96 - 112 mEq/L   CO2 24  19 - 32 mEq/L   Glucose, Bld 160 (*) 70 - 99 mg/dL   BUN 24 (*) 6 - 23 mg/dL   Creatinine, Ser 1.61  0.50 - 1.10 mg/dL   Calcium 09.6  8.4 - 04.5 mg/dL   Total Protein 7.2  6.0 - 8.3 g/dL    Albumin 3.4 (*) 3.5 - 5.2 g/dL   AST 17  0 - 37 U/L   ALT 10  0 - 35 U/L   Alkaline Phosphatase 61  39 - 117 U/L   Total Bilirubin 0.3  0.3 - 1.2 mg/dL   GFR calc non Af Amer 67 (*) >90 mL/min   GFR calc Af Amer 78 (*) >90 mL/min   Comment: (NOTE)     The eGFR has been calculated using the CKD EPI equation.     This calculation has not been validated in all clinical situations.     eGFR's persistently <90 mL/min signify possible Chronic Kidney     Disease.  MAGNESIUM     Status: None   Collection Time    06/03/13  1:15 PM      Result Value Range   Magnesium 1.7  1.5 - 2.5 mg/dL  PHOSPHORUS     Status: None   Collection Time    06/03/13  1:15 PM      Result Value Range   Phosphorus 3.1  2.3 - 4.6 mg/dL  LACTIC ACID, PLASMA     Status: None   Collection Time    06/03/13  1:15 PM      Result Value Range   Lactic Acid, Venous 1.1  0.5 - 2.2 mmol/L  CBC  Status: Abnormal   Collection Time    06/03/13  1:15 PM      Result Value Range   WBC 11.2 (*) 4.0 - 10.5 K/uL   RBC 4.33  3.87 - 5.11 MIL/uL   Hemoglobin 12.9  12.0 - 15.0 g/dL   HCT 30.8  65.7 - 84.6 %   MCV 88.5  78.0 - 100.0 fL   MCH 29.8  26.0 - 34.0 pg   MCHC 33.7  30.0 - 36.0 g/dL   RDW 96.2  95.2 - 84.1 %   Platelets 272  150 - 400 K/uL  SEDIMENTATION RATE     Status: Abnormal   Collection Time    06/03/13  1:15 PM      Result Value Range   Sed Rate 35 (*) 0 - 22 mm/hr    Dg Chest Port 1 View  06/03/2013   CLINICAL DATA:  79 year old female with angioedema. Diabetes and hypertension. Initial encounter.  EXAM: PORTABLE CHEST - 1 VIEW  COMPARISON:  04/04/2013 and earlier.  FINDINGS: Portable AP semi upright view at 1316 hrs. Stable lung volumes. Stable cardiomegaly and mediastinal contours. On the most recent comparison the patient was intubated. The patient is now extubated. Tracheal air column within normal limits. No pneumothorax, pulmonary edema, pleural effusion, or consolidation.  IMPRESSION: No acute  cardiopulmonary abnormality.   Electronically Signed   By: Augusto Gamble M.D.   On: 06/03/2013 13:48    ROS: per pt's adm   Blood pressure 142/72, pulse 69, temperature 98.4 F (36.9 C), temperature source Oral, resp. rate 18, height 5\' 3"  (1.6 m), weight 74.8 kg (164 lb 14.5 oz), SpO2 93.00%.  PHYSICAL EXAM: General appearance - alert, well appearing, and in no distress Mouth - edentulous, no mucosal lesion or ulceration. Significant soft tissue edema of the anterior floor of mouth with nontender swelling. Normal tongue mobility. Neck - supple, no significant adenopathy  Flexible Laryngoscopy: 4 mm fiberoptic nasolaryngoscope passed without difficulty. The nasal cavity nasopharynx, discharge or infection. Normal palate, base of tongue and supraglottis. Multiple vocal cord mobility without evidence of airway distress, edema, mass or tumor. Airway normal without evidence of obstruction, stridor or airway restriction. Mild posterior glottic erythema consistent with possible reflux.    Studies Reviewed:CXR  Assessment/Plan: Patient with recurrent angioedema involving the anterior floor of mouth, etiology unclear at this time. Agree with Dr. Vassie Loll concerns, monitor overnight in ICU. Continue workup, including complement studies. Coordinate with patient's allergy/immunology specialist for additional workup and treatment including possible outpatient evaluation. No significant airway concerns, monitor for resolution of anterior mouth swelling. Plan followup with Dr. Jenne Pane as an outpatient at [facility].  Jo Larson 06/03/2013, 6:00 PM

## 2013-06-03 NOTE — ED Notes (Signed)
Let family member know we need urine, pt had already voided before lab was ordered, will collect when she is able to go again.

## 2013-06-03 NOTE — ED Notes (Signed)
hospitalist at bedside

## 2013-06-04 DIAGNOSIS — Z5189 Encounter for other specified aftercare: Secondary | ICD-10-CM

## 2013-06-04 LAB — CBC
HCT: 37.6 % (ref 36.0–46.0)
Hemoglobin: 13 g/dL (ref 12.0–15.0)
MCH: 30.2 pg (ref 26.0–34.0)
MCHC: 34.6 g/dL (ref 30.0–36.0)
Platelets: 281 10*3/uL (ref 150–400)
RDW: 13 % (ref 11.5–15.5)
WBC: 7.2 10*3/uL (ref 4.0–10.5)

## 2013-06-04 LAB — BASIC METABOLIC PANEL
BUN: 25 mg/dL — ABNORMAL HIGH (ref 6–23)
Calcium: 9.5 mg/dL (ref 8.4–10.5)
Chloride: 104 mEq/L (ref 96–112)
GFR calc non Af Amer: 79 mL/min — ABNORMAL LOW (ref >90)
Glucose, Bld: 219 mg/dL — ABNORMAL HIGH (ref 70–99)
Sodium: 138 mEq/L (ref 135–145)

## 2013-06-04 LAB — URINE CULTURE

## 2013-06-04 MED ORDER — LEVOTHYROXINE SODIUM 50 MCG PO TABS
50.0000 ug | ORAL_TABLET | Freq: Every day | ORAL | Status: DC
  Administered 2013-06-04 – 2013-06-05 (×2): 50 ug via ORAL
  Filled 2013-06-04 (×3): qty 1

## 2013-06-04 MED ORDER — LORATADINE 10 MG PO TABS
10.0000 mg | ORAL_TABLET | Freq: Every day | ORAL | Status: DC
  Administered 2013-06-04 – 2013-06-05 (×2): 10 mg via ORAL
  Filled 2013-06-04 (×2): qty 1

## 2013-06-04 MED ORDER — PANTOPRAZOLE SODIUM 40 MG PO TBEC
40.0000 mg | DELAYED_RELEASE_TABLET | Freq: Every day | ORAL | Status: DC
  Administered 2013-06-04 – 2013-06-05 (×2): 40 mg via ORAL
  Filled 2013-06-04 (×2): qty 1

## 2013-06-04 MED ORDER — LEVOTHYROXINE SODIUM 50 MCG PO TABS
50.0000 ug | ORAL_TABLET | Freq: Every day | ORAL | Status: DC
  Filled 2013-06-04: qty 1

## 2013-06-04 MED ORDER — TRIAMTERENE-HCTZ 37.5-25 MG PO TABS
1.0000 | ORAL_TABLET | Freq: Every day | ORAL | Status: DC
  Administered 2013-06-04 – 2013-06-05 (×2): 1 via ORAL
  Filled 2013-06-04 (×2): qty 1

## 2013-06-04 MED ORDER — METFORMIN HCL 500 MG PO TABS
500.0000 mg | ORAL_TABLET | Freq: Two times a day (BID) | ORAL | Status: DC
Start: 2013-06-04 — End: 2013-06-05
  Administered 2013-06-04 – 2013-06-05 (×2): 500 mg via ORAL
  Filled 2013-06-04 (×5): qty 1

## 2013-06-04 NOTE — Progress Notes (Signed)
Patient transferring to room 1405.  Report called to [facility], RN.  Patient to travel by wheelchair.  Will continue to monitor.

## 2013-06-04 NOTE — Progress Notes (Signed)
Daughter-in-law also present at time of visit. Offered support and explained services. Daughter-in-law said that their English is not fluent. They do have good support from a church in [place]. Brought the patient a prayer shawl. Both patient and daughter-in-law were grateful.

## 2013-06-04 NOTE — Progress Notes (Signed)
PULMONARY  / CRITICAL CARE MEDICINE  Name: Jo Larson MRN: [id number] DOB: Jan 01, 1932    ADMISSION DATE:  06/03/2013   REFERRING MD :EDP PRIMARY SERVICE: PCCM  CHIEF COMPLAINT:  Tongue swelling  BRIEF PATIENT DESCRIPTION: 79 yo [name] who presents with recurrent Angio edema. She has a similar episode 1 year ago from abx with clindamycin listed as the offender & also in 03/2013 requiring intubation. Allergy evaluation did not identify any cause.  SIGNIFICANT EVENTS / STUDIES:  11-12 ENT evaluation  LINES / TUBES:   CULTURES: none  ANTIBIOTICS: none  SUBJECTIVE:  NAD, tongue swelling resolved.  VITAL SIGNS: Temp:  [98.2 F (36.8 C)-98.7 F (37.1 C)] 98.6 F (37 C) (11/13 0800) Pulse Rate:  [57-108] 66 (11/13 0900) Resp:  [13-26] 16 (11/13 0900) BP: (138-198)/(59-128) 179/69 mmHg (11/13 0900) SpO2:  [91 %-97 %] 96 % (11/13 0900) Weight:  [164 lb 14.5 oz (74.8 kg)] 164 lb 14.5 oz (74.8 kg) (11/12 1430) Room air  INTAKE / OUTPUT: Intake/Output     11/12 0701 - 11/13 0700 11/13 0701 - 11/14 0700   P.O. 50    I.V. (mL/kg) 811.7 (10.9) 100 (1.3)   IV Piggyback 50    Total Intake(mL/kg) 911.7 (12.2) 100 (1.3)   Urine (mL/kg/hr) 275 50 (0.2)   Total Output 275 50   Net +636.7 +50        Urine Occurrence 1 x      PHYSICAL EXAMINATION: General:  WNWD [name] who speaks very little english.  Neuro:  INTACT HEENT:  decreasedlingual edema, no jvd, no LAN.Marland Kitchen Speech clear, no stridor Cardiovascular: HSR RRR Lungs:  CTA Abdomen: +BS, soft, non tender Musculoskeletal:  intact Skin: warm, without edema  LABS:  CBC  Recent Labs Lab 06/03/13 1315 06/04/13 0351  WBC 11.2* 7.2  HGB 12.9 13.0  HCT 38.3 37.6  PLT 272 281   BMET  Recent Labs Lab 06/03/13 1315 06/04/13 0351  NA 137 138  K 3.8 3.5  CL 102 104  CO2 24 24  BUN 24* 25*  CREATININE 0.81 0.74  GLUCOSE 160* 219*   Electrolytes  Recent Labs Lab 06/03/13 1315  06/04/13 0351  CALCIUM 10.2 9.5  MG 1.7  --   PHOS 3.1  --    Sepsis Markers  Recent Labs Lab 06/03/13 1315  LATICACIDVEN 1.1   Liver Enzymes  Recent Labs Lab 06/03/13 1315  AST 17  ALT 10  ALKPHOS 61  BILITOT 0.3  ALBUMIN 3.4*   Glucose No results found for this basename: GLUCAP,  in the last 168 hours  Imaging Dg Chest Port 1 View  06/03/2013   CLINICAL DATA:  79 year old female with angioedema. Diabetes and hypertension. Initial encounter.  EXAM: PORTABLE CHEST - 1 VIEW  COMPARISON:  04/04/2013 and earlier.  FINDINGS: Portable AP semi upright view at 1316 hrs. Stable lung volumes. Stable cardiomegaly and mediastinal contours. On the most recent comparison the patient was intubated. The patient is now extubated. Tracheal air column within normal limits. No pneumothorax, pulmonary edema, pleural effusion, or consolidation.  IMPRESSION: No acute cardiopulmonary abnormality.   Electronically Signed   By: Augusto Gamble M.D.   On: 06/03/2013 13:48    ASSESSMENT / PLAN:  PULMONARY A:   Recurrent angioedema ?cause >> pt's family concerned was from [place] exposure P:   D/c solumedrol/pepcid/benadryl F/u C1 esterase F/u with ENT and allergist as outpt  CARDIOVASCULAR A:  Hx of HTN P:  Stop aspirin Stop ca channel blocker,  may need alternative agents if BP high Resume dyazide 11-13  GASTROINTESTINAL A:   GERD P:   Continue protonix while in hospital >> uses nexium as outpt  ENDOCRINE A:   Hx of DM type 2, Hypothyroidism P:   Continue synthroid Resume metformin  TODAY'S SUMMARY:  79 yo [name] who presents with recurrent angioedema,without airway compromise. Suspected ingestion of kale as trigger - other etiologies include ASA or ca channel blocker. 11-13 ENT eval negative. Tx to tele  floor. Follow up as opt with her PCP, ENT, and allergist. No more Kale.  Transfer to telemetry 11/13.  If stable, then possible d/c home 11/14. Updated daughter about  plan.   Brett Canales Minor ACNP [facility] PCCM Pager [phone] till 3 pm If no answer page [phone] 06/04/2013, 10:42 AM  Reviewed above, examined pt, and documentation changes made as needed.  Coralyn Helling, MD [facility] Pulmonary/Critical Care 06/04/2013, 11:23 AM Pager:  [phone] After 3pm call: [phone]

## 2013-06-04 NOTE — Progress Notes (Signed)
Inpatient Diabetes Program Recommendations  AACE/ADA: New Consensus Statement on Inpatient Glycemic Control (2013)  Target Ranges:  Prepandial:   less than 140 mg/dL      Peak postprandial:   less than 180 mg/dL (1-2 hours)      Critically ill patients:  140 - 180 mg/dL   Reason for Visit: Results for DEATRICE, SPANBAUER (MRN [id number]) as of 06/04/2013 09:53  Ref. Range 06/03/2013 13:15 06/04/2013 03:51  Glucose Latest Range: 70-99 mg/dL 782 (H) 956 (H)   Note history of diabetes.  CBG's greater than goal this AM.  Patient is on IV steroids.  Consider adding basal insulin Levemir 14 units daily (0.2 units/kg) and moderate correction tid with meals and HS while in the hospital.  May also consider checking A1C to determine pre-hospitalization glycemic control.      Beryl Meager, RN, BC-ADM Inpatient Diabetes Coordinator Pager [phone]

## 2013-06-05 DIAGNOSIS — I10 Essential (primary) hypertension: Secondary | ICD-10-CM

## 2013-06-05 DIAGNOSIS — E119 Type 2 diabetes mellitus without complications: Secondary | ICD-10-CM

## 2013-06-05 DIAGNOSIS — E079 Disorder of thyroid, unspecified: Secondary | ICD-10-CM

## 2013-06-05 LAB — C1 ESTERASE INHIBITOR: C1INH SerPl-mCnc: 28 mg/dL (ref 21–39)

## 2013-06-05 MED ORDER — EPINEPHRINE 0.3 MG/0.3ML IJ SOAJ: 0.3000 mg | INTRAMUSCULAR | Status: DC | PRN

## 2013-06-05 NOTE — Progress Notes (Signed)
PULMONARY  / CRITICAL CARE MEDICINE  Name: Jo Larson MRN: [id number] DOB: 1931/11/28    ADMISSION DATE:  06/03/2013   REFERRING MD :EDP PRIMARY SERVICE: PCCM  CHIEF COMPLAINT:  Tongue swelling  BRIEF PATIENT DESCRIPTION: 79 yo [name] who presents with recurrent Angio edema. She has a similar episode 1 year ago from abx with clindamycin listed as the offender & also in 03/2013 requiring intubation. Allergy evaluation did not identify any cause.  SIGNIFICANT EVENTS / STUDIES:  11-12 ENT evaluation 11-13 Transfer to telemetry  LINES / TUBES: PIV  CULTURES: none  ANTIBIOTICS: none  SUBJECTIVE:  No complaints.  Tolerating diet.  VITAL SIGNS: Temp:  [97.7 F (36.5 C)-98.6 F (37 C)] 97.7 F (36.5 C) (11/14 0405) Pulse Rate:  [52-79] 52 (11/14 0408) Resp:  [16-23] 20 (11/14 0405) BP: (144-186)/(58-84) 167/58 mmHg (11/14 0408) SpO2:  [95 %-98 %] 95 % (11/14 0405) Weight:  [163 lb 5.8 oz (74.1 kg)] 163 lb 5.8 oz (74.1 kg) (11/13 1623) Room air  INTAKE / OUTPUT: Intake/Output     11/13 0701 - 11/14 0700 11/14 0701 - 11/15 0700   P.O. 300 360   I.V. (mL/kg) 1070 (14.4)    IV Piggyback 50    Total Intake(mL/kg) 1420 (19.2) 360 (4.9)   Urine (mL/kg/hr) 50 (0)    Total Output 50     Net +1370 +360        Urine Occurrence 5 x 2 x     PHYSICAL EXAMINATION: General: No distress Neuro: Normal strength HEENT: no stridor Cardiovascular: regular Lungs: no wheeze Abdomen: +BS, soft, non tender Musculoskeletal:  intact Skin: warm, without edema  LABS:  CBC  Recent Labs Lab 06/03/13 1315 06/04/13 0351  WBC 11.2* 7.2  HGB 12.9 13.0  HCT 38.3 37.6  PLT 272 281   BMET  Recent Labs Lab 06/03/13 1315 06/04/13 0351  NA 137 138  K 3.8 3.5  CL 102 104  CO2 24 24  BUN 24* 25*  CREATININE 0.81 0.74  GLUCOSE 160* 219*   Electrolytes  Recent Labs Lab 06/03/13 1315 06/04/13 0351  CALCIUM 10.2 9.5  MG 1.7  --   PHOS 3.1  --    Sepsis  Markers  Recent Labs Lab 06/03/13 1315  LATICACIDVEN 1.1   Liver Enzymes  Recent Labs Lab 06/03/13 1315  AST 17  ALT 10  ALKPHOS 61  BILITOT 0.3  ALBUMIN 3.4*   Glucose  Recent Labs Lab 06/04/13 2203  GLUCAP 223*    Imaging Dg Chest Port 1 View  06/03/2013   CLINICAL DATA:  79 year old female with angioedema. Diabetes and hypertension. Initial encounter.  EXAM: PORTABLE CHEST - 1 VIEW  COMPARISON:  04/04/2013 and earlier.  FINDINGS: Portable AP semi upright view at 1316 hrs. Stable lung volumes. Stable cardiomegaly and mediastinal contours. On the most recent comparison the patient was intubated. The patient is now extubated. Tracheal air column within normal limits. No pneumothorax, pulmonary edema, pleural effusion, or consolidation.  IMPRESSION: No acute cardiopulmonary abnormality.   Electronically Signed   By: Augusto Gamble M.D.   On: 06/03/2013 13:48    ASSESSMENT / PLAN:  PULMONARY A:   Recurrent angioedema ?cause >> pt's family concerned was from [place] exposure P:   F/u C1 esterase F/u with ENT and allergist as outpt  CARDIOVASCULAR A:  Hx of HTN P:  Stopped aspirin, calcium channel blocker with ? This contributing to angioedema >> need to f/u with allergist and PCP before resuming Resumed  dyazide 11-13 Further assess BP regimen as outpt  GASTROINTESTINAL A:   GERD P:   Continue protonix while in hospital >> uses nexium as outpt  ENDOCRINE A:   Hx of DM type 2, Hypothyroidism P:   Continue synthroid Resumed metformin 11/13  Will d/c home.  Will need outpt f/u with PCP, ENT, and allergist within 1 to 2 weeks.  Coralyn Helling, MD [facility] Pulmonary/Critical Care 06/05/2013, 11:45 AM Pager:  [phone] After 3pm call: [phone]

## 2013-06-05 NOTE — Care Management Note (Signed)
    Page 1 of 1   06/05/2013     3:13:33 PM   CARE MANAGEMENT NOTE 06/05/2013  Patient:  Jo Larson, Jo Larson   Account Number:  [id number]  Date Initiated:  06/05/2013  Documentation initiated by:  Lanier Clam  Subjective/Objective Assessment:   79 Y/O F ADMITTED W/HYPOTHYROIDISM.     Action/Plan:   FROM HOME W/DTR.SPEAKS [place].HAS PCP,PHARMACY.   Anticipated DC Date:  06/05/2013   Anticipated DC Plan:  HOME/SELF CARE      DC Planning Services  CM consult      Choice offered to / List presented to:             Status of service:  Completed, signed off Medicare Important Message given?   (If response is "NO", the following Medicare IM given date fields will be blank) Date Medicare IM given:   Date Additional Medicare IM given:    Discharge Disposition:  HOME/SELF CARE  Per UR Regulation:  Reviewed for med. necessity/level of care/duration of stay  If discussed at Long Length of Stay Meetings, dates discussed:    Comments:  06/05/13 Anadelia Kintz RN,BSN NCM [phone] D/C HOME NO NEEDS OR ORDERS.

## 2013-06-05 NOTE — Discharge Summary (Signed)
Physician Discharge Summary     Patient ID: Jo Larson MRN: [id number] DOB/AGE: 1932/04/19 79 y.o.  Admit date: 06/03/2013 Discharge date: 06/05/2013  Discharge Diagnoses:  Active Problems:   HYPOTHYROIDISM   DM, UNCOMPLICATED, TYPE II   DEFICIENCY, VITAMIN D NOS   HYPERLIPIDEMIA   ANXIETY DISORDER, GENERALIZED   HYPERTENSION, BENIGN ESSENTIAL   SYSTOLIC MURMUR   Angioedema  Detailed Hospital Course:  79 yo [name] who presents with recurrent Angio edema on 11/12. She has a similar episode 1 year ago from abx with clindamycin listed as the offender & also in 03/2013 requiring intubation. Allergy evaluation did not identify any cause.  She was admitted to the ICU. Interventions included: H1/H2 blockade, stopping all meds, systemic steroids, and ENT eval. She was seen By Dr Annalee Genta on 11/12. Had flexible laryngoscopy completed. Findings were: Normal palate, base of tongue and supraglottis. Multiple vocal cord mobility without evidence of airway distress, edema, mass or tumor. Airway normal without evidence of obstruction, stridor or airway restriction. Mild posterior glottic erythema consistent with possible reflux. Additional evaluation included C1 esterase inhibitor studies as well as close observation in the ICU. On 11/14 there was no further evidence of angioedema and she was cleared for d/c with the plan as outlined below.    Discharge Plan by diagnoses   Recurrent angioedema ?cause >> pt's family concerned was from [place] exposure  P:  F/u C1 esterase  F/u with ENT and allergist as outpt (appointments have been scheduled)   Hx of HTN  P:  Stopped aspirin, calcium channel blocker with ? This contributing to angioedema >> need to f/u with allergist and PCP before resuming  Resumed dyazide 11-13  Further assess BP regimen as outpt  GERD  P:  nexium as outpt   Hx of DM type 2, Hypothyroidism  P:  Continue synthroid  Resumed metformin 11/13  Significant  Hospital tests/ studies/ interventions and procedures  Consults: shoemaker   Discharge Exam: BP 139/64  Pulse 58  Temp(Src) 97.9 F (36.6 C) (Oral)  Resp 20  Ht 5\' 3"  (1.6 m)  Wt 74.1 kg (163 lb 5.8 oz)  BMI 28.95 kg/m2  SpO2 97%  General: No distress  Neuro: Normal strength  HEENT: no stridor  Cardiovascular: regular  Lungs: no wheeze  Abdomen: +BS, soft, non tender  Musculoskeletal: intact  Skin: warm, without edema   Labs at discharge Lab Results  Component Value Date   CREATININE 0.74 06/04/2013   BUN 25* 06/04/2013   NA 138 06/04/2013   K 3.5 06/04/2013   CL 104 06/04/2013   CO2 24 06/04/2013   Lab Results  Component Value Date   WBC 7.2 06/04/2013   HGB 13.0 06/04/2013   HCT 37.6 06/04/2013   MCV 87.2 06/04/2013   PLT 281 06/04/2013   Lab Results  Component Value Date   ALT 10 06/03/2013   AST 17 06/03/2013   ALKPHOS 61 06/03/2013   BILITOT 0.3 06/03/2013   No results found for this basename: INR,  PROTIME    Current radiology studies No results found.  Disposition:  01-Home or Self Care      Discharge Orders   Future Orders Complete By Expires   Diet - low sodium heart healthy  As directed    Increase activity slowly  As directed        Medication List    STOP taking these medications       aspirin 325 MG EC tablet  fish oil-omega-3 fatty acids 1000 MG capsule     OVER THE COUNTER MEDICATION     verapamil 240 MG CR tablet  Commonly known as:  CALAN-SR      TAKE these medications       cholecalciferol 1000 UNITS tablet  Commonly known as:  VITAMIN D  Take 1,000 Units by mouth daily.     EPINEPHrine 0.3 mg/0.3 mL Soaj injection  Commonly known as:  EPI-PEN  Inject 0.3 mLs (0.3 mg total) into the muscle as needed (for resp distress from angioedema).     esomeprazole 40 MG capsule  Commonly known as:  NEXIUM  Take 40 mg by mouth daily before breakfast.     levothyroxine 50 MCG tablet  Commonly known as:  SYNTHROID,  LEVOTHROID  Take 50 mcg by mouth daily before breakfast.     loratadine 10 MG tablet  Commonly known as:  CLARITIN  Take 10 mg by mouth daily.     LORazepam 1 MG tablet  Commonly known as:  ATIVAN  Take 1 mg by mouth 2 (two) times daily.     metFORMIN 500 MG tablet  Commonly known as:  GLUCOPHAGE  Take 500 mg by mouth 2 (two) times daily with a meal.     triamterene-hydrochlorothiazide 37.5-25 MG per capsule  Commonly known as:  DYAZIDE  Take 1 capsule by mouth daily.       Follow-up Information   Follow up with Christia Reading, MD On 06/12/2013. (940 am )    Specialty:  Otolaryngology   Contact information:   [address] [place] [address] [phone]       Follow up with Dr Lerry Liner at [facility] [phone] In 2 weeks.      Follow up with Erskine Squibb  On 06/08/2013. (10am )    Contact information:   Address: [address] [place], [place] [address] [phone]      Discharged Condition: good   Physician Statement:   The Patient was personally examined, the discharge assessment and plan has been personally reviewed and I agree with ACNP Babcock's assessment and plan. > 30 minutes of time have been dedicated to discharge assessment, planning and discharge instructions.   SignedAnders Simmonds 06/05/2013, 2:49 PM   Coralyn Helling, MD [facility] Pulmonary/Critical Care 06/05/2013, 3:20 PM Pager:  [phone] After 3pm call: [phone]

## 2014-07-24 ENCOUNTER — Emergency Department (HOSPITAL_COMMUNITY): Payer: Medicare Other

## 2014-07-24 ENCOUNTER — Inpatient Hospital Stay (HOSPITAL_COMMUNITY)
Admission: EM | Admit: 2014-07-24 | Discharge: 2014-07-27 | DRG: 247 | Disposition: A | Discharge status: Home / Self Care | Payer: Medicare Other | Attending: Cardiology | Admitting: Cardiology

## 2014-07-24 ENCOUNTER — Encounter (HOSPITAL_COMMUNITY): Payer: Self-pay | Admitting: Emergency Medicine

## 2014-07-24 DIAGNOSIS — I10 Essential (primary) hypertension: Secondary | ICD-10-CM | POA: Diagnosis present

## 2014-07-24 DIAGNOSIS — R9389 Abnormal findings on diagnostic imaging of other specified body structures: Secondary | ICD-10-CM | POA: Diagnosis present

## 2014-07-24 DIAGNOSIS — I214 Non-ST elevation (NSTEMI) myocardial infarction: Secondary | ICD-10-CM | POA: Diagnosis present

## 2014-07-24 DIAGNOSIS — I2510 Atherosclerotic heart disease of native coronary artery without angina pectoris: Secondary | ICD-10-CM | POA: Diagnosis not present

## 2014-07-24 DIAGNOSIS — E876 Hypokalemia: Secondary | ICD-10-CM | POA: Diagnosis not present

## 2014-07-24 DIAGNOSIS — E785 Hyperlipidemia, unspecified: Secondary | ICD-10-CM | POA: Diagnosis present

## 2014-07-24 DIAGNOSIS — Z7982 Long term (current) use of aspirin: Secondary | ICD-10-CM | POA: Diagnosis not present

## 2014-07-24 DIAGNOSIS — E119 Type 2 diabetes mellitus without complications: Secondary | ICD-10-CM | POA: Diagnosis present

## 2014-07-24 DIAGNOSIS — F419 Anxiety disorder, unspecified: Secondary | ICD-10-CM | POA: Diagnosis present

## 2014-07-24 DIAGNOSIS — Z9861 Coronary angioplasty status: Secondary | ICD-10-CM

## 2014-07-24 DIAGNOSIS — I252 Old myocardial infarction: Secondary | ICD-10-CM | POA: Diagnosis present

## 2014-07-24 DIAGNOSIS — E039 Hypothyroidism, unspecified: Secondary | ICD-10-CM | POA: Diagnosis present

## 2014-07-24 HISTORY — DX: Atherosclerotic heart disease of native coronary artery without angina pectoris: I25.10

## 2014-07-24 HISTORY — DX: Hyperlipidemia, unspecified: E78.5

## 2014-07-24 HISTORY — DX: Abnormal findings on diagnostic imaging of other specified body structures: R93.89

## 2014-07-24 LAB — CBC
HEMATOCRIT: 41.5 % (ref 36.0–46.0)
Hemoglobin: 14.3 g/dL (ref 12.0–15.0)
MCH: 30.7 pg (ref 26.0–34.0)
MCHC: 34.5 g/dL (ref 30.0–36.0)
MCV: 89.1 fL (ref 78.0–100.0)
Platelets: 260 10*3/uL (ref 150–400)
RBC: 4.66 MIL/uL (ref 3.87–5.11)
RDW: 13.1 % (ref 11.5–15.5)
WBC: 9.1 10*3/uL (ref 4.0–10.5)

## 2014-07-24 LAB — BASIC METABOLIC PANEL
Anion gap: 10 (ref 5–15)
BUN: 24 mg/dL — ABNORMAL HIGH (ref 6–23)
CALCIUM: 9.9 mg/dL (ref 8.4–10.5)
CO2: 24 mmol/L (ref 19–32)
CREATININE: 0.93 mg/dL (ref 0.50–1.10)
Chloride: 104 mEq/L (ref 96–112)
GFR calc Af Amer: 66 mL/min — ABNORMAL LOW (ref >90)
GFR, EST NON AFRICAN AMERICAN: 57 mL/min — AB (ref >90)
GLUCOSE: 143 mg/dL — AB (ref 70–99)
Potassium: 4.1 mmol/L (ref 3.5–5.1)
Sodium: 138 mmol/L (ref 135–145)

## 2014-07-24 LAB — MRSA PCR SCREENING: MRSA by PCR: NEGATIVE

## 2014-07-24 LAB — I-STAT TROPONIN, ED: Troponin i, poc: 2.4 ng/mL (ref 0.00–0.08)

## 2014-07-24 LAB — CBG MONITORING, ED: Glucose-Capillary: 166 mg/dL — ABNORMAL HIGH (ref 70–99)

## 2014-07-24 LAB — PROTIME-INR
INR: 0.97 (ref 0.00–1.49)
Prothrombin Time: 13 s (ref 11.6–15.2)

## 2014-07-24 LAB — TSH: TSH: 3.363 u[IU]/mL (ref 0.350–4.500)

## 2014-07-24 LAB — HEPARIN LEVEL (UNFRACTIONATED): HEPARIN UNFRACTIONATED: 0.28 [IU]/mL — AB (ref 0.30–0.70)

## 2014-07-24 LAB — TROPONIN I: Troponin I: 3.51 ng/mL (ref <0.031)

## 2014-07-24 LAB — APTT: aPTT: 30 s (ref 24–37)

## 2014-07-24 MED ORDER — NITROGLYCERIN 0.4 MG SL SUBL: 0.4000 mg | SUBLINGUAL_TABLET | SUBLINGUAL | Status: DC | PRN

## 2014-07-24 MED ORDER — HEPARIN SODIUM (PORCINE) 5000 UNIT/ML IJ SOLN
4000.0000 [IU] | Freq: Once | INTRAMUSCULAR | Status: AC
  Administered 2014-07-24: 4000 [IU] via INTRAVENOUS
  Filled 2014-07-24: qty 0.8

## 2014-07-24 MED ORDER — SODIUM CHLORIDE 0.9 % IJ SOLN
3.0000 mL | Freq: Two times a day (BID) | INTRAMUSCULAR | Status: DC
  Administered 2014-07-24 – 2014-07-25 (×2): 3 mL via INTRAVENOUS

## 2014-07-24 MED ORDER — ONDANSETRON HCL 4 MG/2ML IJ SOLN
4.0000 mg | Freq: Four times a day (QID) | INTRAMUSCULAR | Status: DC | PRN
  Administered 2014-07-26: 4 mg via INTRAVENOUS
  Filled 2014-07-24: qty 2

## 2014-07-24 MED ORDER — LORAZEPAM 1 MG PO TABS
1.0000 mg | ORAL_TABLET | Freq: Every day | ORAL | Status: DC
  Administered 2014-07-24 – 2014-07-26 (×3): 1 mg via ORAL
  Filled 2014-07-24 (×3): qty 1

## 2014-07-24 MED ORDER — METFORMIN HCL 500 MG PO TABS
500.0000 mg | ORAL_TABLET | Freq: Two times a day (BID) | ORAL | Status: DC
  Administered 2014-07-25 – 2014-07-26 (×2): 500 mg via ORAL
  Filled 2014-07-24 (×5): qty 1

## 2014-07-24 MED ORDER — ATORVASTATIN CALCIUM 80 MG PO TABS
80.0000 mg | ORAL_TABLET | Freq: Every day | ORAL | Status: DC
  Administered 2014-07-25 – 2014-07-26 (×2): 80 mg via ORAL
  Filled 2014-07-24 (×3): qty 1

## 2014-07-24 MED ORDER — LEVOTHYROXINE SODIUM 50 MCG PO TABS
50.0000 ug | ORAL_TABLET | Freq: Every day | ORAL | Status: DC
  Administered 2014-07-25 – 2014-07-27 (×3): 50 ug via ORAL
  Filled 2014-07-24 (×4): qty 1

## 2014-07-24 MED ORDER — PANTOPRAZOLE SODIUM 40 MG PO TBEC
40.0000 mg | DELAYED_RELEASE_TABLET | Freq: Every day | ORAL | Status: DC
  Administered 2014-07-25 – 2014-07-27 (×3): 40 mg via ORAL
  Filled 2014-07-24 (×3): qty 1

## 2014-07-24 MED ORDER — SODIUM CHLORIDE 0.9 % IV SOLN: 250.0000 mL | INTRAVENOUS | Status: DC | PRN

## 2014-07-24 MED ORDER — HEPARIN SODIUM (PORCINE) 5000 UNIT/ML IJ SOLN: 60.0000 [IU]/kg | Freq: Once | INTRAMUSCULAR | Status: DC

## 2014-07-24 MED ORDER — METOPROLOL TARTRATE 12.5 MG HALF TABLET
12.5000 mg | ORAL_TABLET | Freq: Two times a day (BID) | ORAL | Status: DC
  Administered 2014-07-24 – 2014-07-27 (×6): 12.5 mg via ORAL
  Filled 2014-07-24 (×7): qty 1

## 2014-07-24 MED ORDER — NITROGLYCERIN IN D5W 200-5 MCG/ML-% IV SOLN: 3.0000 ug/min | INTRAVENOUS | Status: DC

## 2014-07-24 MED ORDER — ASPIRIN EC 81 MG PO TBEC: 162.0000 mg | DELAYED_RELEASE_TABLET | Freq: Once | ORAL | Status: AC

## 2014-07-24 MED ORDER — ASPIRIN 81 MG PO CHEW
162.0000 mg | CHEWABLE_TABLET | Freq: Once | ORAL | Status: AC
  Administered 2014-07-24: 162 mg via ORAL
  Filled 2014-07-24: qty 2

## 2014-07-24 MED ORDER — HEPARIN (PORCINE) IN NACL 100-0.45 UNIT/ML-% IJ SOLN
1050.0000 [IU]/h | INTRAMUSCULAR | Status: DC
  Administered 2014-07-24: 900 [IU]/h via INTRAVENOUS
  Filled 2014-07-24 (×5): qty 250

## 2014-07-24 MED ORDER — SODIUM CHLORIDE 0.9 % IJ SOLN: 3.0000 mL | INTRAMUSCULAR | Status: DC | PRN

## 2014-07-24 MED ORDER — NITROGLYCERIN IN D5W 200-5 MCG/ML-% IV SOLN
5.0000 ug/min | INTRAVENOUS | Status: DC
  Administered 2014-07-24: 5 ug/min via INTRAVENOUS
  Filled 2014-07-24: qty 250

## 2014-07-24 MED ORDER — SODIUM CHLORIDE 0.9 % IV SOLN
1.0000 mL/kg/h | INTRAVENOUS | Status: DC
Start: 2014-07-25 — End: 2014-07-26
  Administered 2014-07-26: 1 mL/kg/h via INTRAVENOUS

## 2014-07-24 MED ORDER — ASPIRIN EC 81 MG PO TBEC
81.0000 mg | DELAYED_RELEASE_TABLET | Freq: Every day | ORAL | Status: DC
  Administered 2014-07-25 – 2014-07-27 (×3): 81 mg via ORAL
  Filled 2014-07-24 (×3): qty 1

## 2014-07-24 MED ORDER — ACETAMINOPHEN 325 MG PO TABS: 650.0000 mg | ORAL_TABLET | ORAL | Status: DC | PRN

## 2014-07-24 NOTE — Progress Notes (Addendum)
ANTICOAGULATION CONSULT NOTE - Initial Consult  Pharmacy Consult for Heparin Indication: chest pain/ACS  Allergies  Allergen Reactions  . Clindamycin/Lincomycin Swelling    Tongue swelling, angioedema    Patient Measurements: Height:  (167.6 cm) Weight: 173 lb (78.472 kg) IBW/kg (Calculated) : 59.3 Heparin Dosing Weight: 75.4 kg  Vital Signs: Temp: 98.1 F (36.7 C) (01/02 1138) Temp Source: Oral (01/02 1138) BP: 159/75 mmHg (01/02 1138) Pulse Rate: 78 (01/02 1138)  Labs:  Recent Labs  07/24/14 1210  HGB 14.3  HCT 41.5  PLT 260  CREATININE 0.93  TROPONINI 3.51*    Estimated Creatinine Clearance: 51 mL/min (by C-G formula based on Cr of 0.93).   Medical History: Past Medical History  Diagnosis Date  . Diabetes mellitus without complication   . Hypertension   . Anxiety   . Hypothyroidism   . Angioedema     Assessment: 81 y/o F with PMH of DM, HTN, anxiety, hypothyroidism, angioedema who presents to ED with chest pain. Troponin of 2.4. EKG interpretation suggested ischemia, new T wave inversions laterally. Pharmacy consulted to assist with dosing of heparin for ACS.  Goal of Therapy:  Heparin level 0.3-0.7 units/ml Monitor platelets by anticoagulation protocol: Yes   Plan:   Baseline CBC WNL. APTT, PT/INR pending.  Heparin 4000 units IV bolus x 1, then heparin infusion at 900 unitshr  Check anti-Xa level in 8 hours and daily while on heparin.  Daily CBC while on heparin.  Monitor for signs and symptoms of bleeding.   Greer Pickerel, PharmD, BCPS Pager: [phone] 07/24/2014 1:07 PM

## 2014-07-24 NOTE — H&P (Addendum)
CARDIOLOGY ADMISSION NOTE  Patient ID: Maris Abascal MRN: [id number] DOB/AGE: [birth year] 81 y.o.  Admit date: 07/24/2014 Primary Physician   Dr. Mayford Knife Primary Cardiologist   None Chief Complaint    Chest pain.    HPI:  [place].   The patient was most recently hospitalized in November with angioedema related possibly to treatment with clindamycin.  The history is provided through her family who interprets.  She has no past cardiac history.  I do see a past echocardiogram with mild aortic valve calcification.  She apparently developed chest pain this morning at rest.  She describes left arm discomfort as well.  She has had no associated symptoms and she denies any neck or jaw pain.  She has had no SOB, PND or orthopnea.  She has never had symptoms like this before.    In the ED troponin was elevated at 3.51.  EKG did demonstrate broad inverted T waves in the lateral leads.  She was treated with ASA, NTG IV, and Heparin and is currently pain free.     Past Medical History  Diagnosis Date  . Diabetes mellitus without complication   . Hypertension   . Anxiety   . Hypothyroidism   . Angioedema   . Hyperlipemia     Past Surgical History  Procedure Laterality Date  . Intubation nasotracheal N/A 04/03/2013    Procedure: FIBEROPTIC NASAL-TRACHEAL INTUBATION;  Surgeon: Christia Reading, MD;  Location: [facility];  Service: ENT;  Laterality: N/A;    Allergies  Allergen Reactions  . Clindamycin/Lincomycin Swelling    Tongue swelling, angioedema   No current facility-administered medications on file prior to encounter.   Current Outpatient Prescriptions on File Prior to Encounter  Medication Sig Dispense Refill  . cholecalciferol (VITAMIN D) 1000 UNITS tablet Take 1,000 Units by mouth daily.    Marland Kitchen EPINEPHrine (EPI-PEN) 0.3 mg/0.3 mL SOAJ injection Inject 0.3 mLs (0.3 mg total) into the muscle as needed (for resp distress from angioedema). 1 Device 3  . esomeprazole (NEXIUM) 40 MG capsule Take  40 mg by mouth daily before breakfast.    . levothyroxine (SYNTHROID, LEVOTHROID) 50 MCG tablet Take 50 mcg by mouth daily before breakfast.    . LORazepam (ATIVAN) 1 MG tablet Take 1 mg by mouth at bedtime.     . metFORMIN (GLUCOPHAGE) 500 MG tablet Take 500 mg by mouth 2 (two) times daily with a meal.    . triamterene-hydrochlorothiazide (DYAZIDE) 37.5-25 MG per capsule Take 1 capsule by mouth daily.     History   Social History  . Marital Status: Married    Spouse Name: N/A    Number of Children: 3  . Years of Education: N/A   Occupational History  . Not on file.   Social History Main Topics  . Smoking status: Never Smoker   . Smokeless tobacco: Never Used  . Alcohol Use: No  . Drug Use: Not on file  . Sexual Activity: No   Other Topics Concern  . Not on file   Social History Narrative   Lives at home with daughter in law.     Family History  Problem Relation Age of Onset  . Hypertension Father   . Diabetes Father      ROS:  Mild cough.  Otherwise as stated in the HPI and negative for all other systems.  Physical Exam: Blood pressure 142/69, pulse 58, temperature 98.6 F (37 C), temperature source Oral, resp. rate 22, height  (1.676 m), weight 172  lb 2.9 oz (78.1 kg), SpO2 97 %.  GENERAL:  Well appearing HEENT:  Pupils equal round and reactive, fundi not visualized, oral mucosa unremarkable NECK:  No jugular venous distention, waveform within normal limits, carotid upstroke brisk and symmetric, no bruits, no thyromegaly LYMPHATICS:  No cervical, inguinal adenopathy LUNGS:  Clear to auscultation bilaterally BACK:  No CVA tenderness CHEST:  Unremarkable HEART:  PMI not displaced or sustained,S1 and S2 within normal limits, no S3, no S4, no clicks, no rubs, no murmurs ABD:  Flat, positive bowel sounds normal in frequency in pitch, no bruits, no rebound, no guarding, no midline pulsatile mass, no hepatomegaly, no splenomegaly EXT:  2 plus pulses throughout, no  edema, no cyanosis no clubbing, traumatic amputation right thumb, index and middle finger SKIN:  No rashes no nodules NEURO:  Cranial nerves II through XII grossly intact, motor grossly intact throughout PSYCH:  Cognitively intact, oriented to person place and time   Labs: Lab Results  Component Value Date   BUN 24* 07/24/2014   Lab Results  Component Value Date   CREATININE 0.93 07/24/2014   Lab Results  Component Value Date   NA 138 07/24/2014   K 4.1 07/24/2014   CL 104 07/24/2014   CO2 24 07/24/2014   Lab Results  Component Value Date   TROPONINI 3.51* 07/24/2014   Lab Results  Component Value Date   WBC 9.1 07/24/2014   HGB 14.3 07/24/2014   HCT 41.5 07/24/2014   MCV 89.1 07/24/2014   PLT 260 07/24/2014     Radiology:  CXR:  Cluster of calcific appearing densities projecting over the right lung base. It is unclear whether this lies in the lung or potentially elsewhere in the body. Initially, a PA and lateral chest x-ray would be helpful in further localizing this abnormality.  EKG:   NSR, rate 76, axis WNL, QTc slightly long, lateral T wave inversion new since previous EKG.  07/24/2014   ASSESSMENT AND PLAN:    NQWMI:  The patient will continue with ASA, IV NTG and low dose beta blocker.  She will have an elective cardiac cath unless she has recurrent symptoms.  The patient understands that risks included but are not limited to stroke (1 in 1000), death (1 in 1000), kidney failure [usually temporary] (1 in 500), bleeding (1 in 200), allergic reaction [possibly serious] (1 in 200).  The patient understands and agrees to proceed.   She will need an interpreter for the procedure.    ABNORMAL CXR:  This was noted on the portable CXR.  This should be repeated with PA lateral images prior to discharge.   DM:  Continue previous meds.    Check A1C  ANGIOEDEMA:  She has a history of this without a clear etiology.  Although clindamycin was suggested the etiology was still  not clear.  ASA and Dilt had been held until allergy testing could be completed.    However, I will choose to start ASA since there was no clear contraindication to this.   SignedRollene Rotunda 07/24/2014, 7:18 PM

## 2014-07-24 NOTE — ED Notes (Signed)
Spoke with bed placement as pt has order for step down and she changed to 2H, will call report. [facility] is at the edside

## 2014-07-24 NOTE — Progress Notes (Signed)
ANTICOAGULATION CONSULT NOTE - Follow Up Consult  Pharmacy Consult for heparin Indication: NSTEMI  Allergies  Allergen Reactions  . Clindamycin/Lincomycin Swelling    Tongue swelling, angioedema    Patient Measurements: Height:  (167.6 cm) Weight: 172 lb 2.9 oz (78.1 kg) IBW/kg (Calculated) : 59.3 Heparin Dosing Weight: 75 kg  Vital Signs: Temp: 98.9 F (37.2 C) (01/02 1954) Temp Source: Oral (01/02 1954) BP: 170/71 mmHg (01/02 2014) Pulse Rate: 59 (01/02 2014)  Labs:  Recent Labs  07/24/14 1210 07/24/14 2121  HGB 14.3  --   HCT 41.5  --   PLT 260  --   APTT 30  --   LABPROT 13.0  --   INR 0.97  --   HEPARINUNFRC  --  0.28*  CREATININE 0.93  --   TROPONINI 3.51*  --     Estimated Creatinine Clearance: 50.9 mL/min (by C-G formula based on Cr of 0.93).   Medications:  Infusions:  . [START ON 07/25/2014] sodium chloride    . heparin 900 Units/hr (07/24/14 1316)  . nitroGLYCERIN 5 mcg/min (07/24/14 1452)  . nitroGLYCERIN      Assessment: 81 y/o female who presented to [facility] ED with CP and was started on IV heparin. Ruled in for NSTEMI. Heparin level is just below goal level at 0.28 on 900 units/hr. No bleeding noted. Cath planned.   Goal of Therapy:  Heparin level 0.3-0.7 units/ml Monitor platelets by anticoagulation protocol: Yes   Plan:  - Increase heparin drip to 1050 units/hr - Heparin level in the morning - Monitor for s/sx of bleeding  [facility], [place].D., BCPS Clinical Pharmacist Pager: [phone] 07/24/2014 10:10 PM

## 2014-07-24 NOTE — Progress Notes (Signed)
Patient understands very little english and speaks none. Her primary language is [place]. Daughter in law is at the bedside. I called interpreting services and patient has denied wanting a Nurse, learning disability and has given permission for the daughter in law to translate on her behalf.

## 2014-07-24 NOTE — ED Notes (Signed)
Pt sent from PCP for further evaluation of CP

## 2014-07-24 NOTE — ED Notes (Addendum)
Pt from PCP office for chest pain and shortness of breath that started last pm. Currently denies pain. PCP wanted EKG and CXR. 81 mg ASA given in office

## 2014-07-24 NOTE — ED Notes (Signed)
Vonna Kotyk from lab called to report a critical troponin of 3.51. MD Gwendolyn Grant made aware.

## 2014-07-24 NOTE — ED Notes (Signed)
Patient is resting comfortably. 

## 2014-07-24 NOTE — ED Notes (Signed)
Call to [facility] to give report to RN, Secretary has called [facility].

## 2014-07-24 NOTE — ED Notes (Signed)
Dr Gwendolyn Grant notified of patient's troponin results 2.40

## 2014-07-24 NOTE — ED Provider Notes (Signed)
CSN: [id number]     Arrival date & time 07/24/14  1123 History   First MD Initiated Contact with Patient 07/24/14 1206     Chief Complaint  Patient presents with  . Chest Pain     (Consider location/radiation/quality/duration/timing/severity/associated sxs/prior Treatment) HPI Patient presents to the emergency department with chest pain that started in the middle the night and shortness of breath.  The patient is from [place] and her daughter is translating for [place].  The patient states that the pain is somewhat better but still present and radiates to her left arm.  The patient does have diabetes and hypertension.  The daughter states that she took her to her primary care doctor's office and they sent her to the emergency department.  She is given 81 mg aspirin in their office.  Patient has not had any nausea, vomiting, diaphoresis, weakness, dizziness, back pain, neck pain, rash, hematemesis, bloody stool, dysuria, abdominal pain, cough, fever, dyspnea on exertion, near syncope or syncope.  The daughter states that the patient not take any medications other than what was given to her at the primary care doctor's office Past Medical History  Diagnosis Date  . Diabetes mellitus without complication   . Hypertension   . Anxiety   . Hypothyroidism   . Angioedema    Past Surgical History  Procedure Laterality Date  . Intubation nasotracheal N/A 04/03/2013    Procedure: FIBEROPTIC NASAL-TRACHEAL INTUBATION;  Surgeon: Christia Reading, MD;  Location: [facility];  Service: ENT;  Laterality: N/A;   No family history on file. History  Substance Use Topics  . Smoking status: Never Smoker   . Smokeless tobacco: Never Used  . Alcohol Use: No   OB History    No data available     Review of Systems   All other systems negative except as documented in the HPI. All pertinent positives and negatives as reviewed in the HPI. Allergies  Clindamycin/lincomycin  Home Medications   Prior to Admission  medications   Medication Sig Start Date End Date Taking? Authorizing Provider  aspirin EC 81 MG tablet Take 162 mg by mouth once.   Yes Historical Provider, MD  cholecalciferol (VITAMIN D) 1000 UNITS tablet Take 1,000 Units by mouth daily.   Yes Historical Provider, MD  EPINEPHrine (EPI-PEN) 0.3 mg/0.3 mL SOAJ injection Inject 0.3 mLs (0.3 mg total) into the muscle as needed (for resp distress from angioedema). 06/05/13  Yes Simonne Martinet, NP  esomeprazole (NEXIUM) 40 MG capsule Take 40 mg by mouth daily before breakfast.   Yes Historical Provider, MD  levothyroxine (SYNTHROID, LEVOTHROID) 50 MCG tablet Take 50 mcg by mouth daily before breakfast.   Yes Historical Provider, MD  LORazepam (ATIVAN) 1 MG tablet Take 1 mg by mouth at bedtime.    Yes Historical Provider, MD  metFORMIN (GLUCOPHAGE) 500 MG tablet Take 500 mg by mouth 2 (two) times daily with a meal.   Yes Historical Provider, MD  triamterene-hydrochlorothiazide (DYAZIDE) 37.5-25 MG per capsule Take 1 capsule by mouth daily.   Yes Historical Provider, MD   BP 159/75 mmHg  Pulse 78  Temp(Src) 98.1 F (36.7 C) (Oral)  Resp 20  Wt 173 lb (78.472 kg)  SpO2 98% Physical Exam  Constitutional: She is oriented to person, place, and time. She appears well-developed and well-nourished. No distress.  HENT:  Head: Normocephalic and atraumatic.  Mouth/Throat: Oropharynx is clear and moist.  Eyes: Pupils are equal, round, and reactive to light.  Neck: Normal range of  motion. Neck supple.  Cardiovascular: Normal rate, regular rhythm and normal heart sounds.  Exam reveals no gallop and no friction rub.   No murmur heard. Pulmonary/Chest: Effort normal and breath sounds normal. No respiratory distress.  Abdominal: Soft. Bowel sounds are normal. She exhibits no distension.  Musculoskeletal: She exhibits no edema.  Neurological: She is alert and oriented to person, place, and time. She exhibits normal muscle tone. Coordination normal.  Skin:  Skin is warm and dry. No rash noted. No erythema.  Psychiatric: She has a normal mood and affect. Her behavior is normal.  Nursing note and vitals reviewed.   ED Course  Procedures (including critical care time) Labs Review Labs Reviewed  I-STAT TROPOININ, ED - Abnormal; Notable for the following:    Troponin i, poc 2.40 (*)    All other components within normal limits  CBG MONITORING, ED - Abnormal; Notable for the following:    Glucose-Capillary 166 (*)    All other components within normal limits  CBC  BASIC METABOLIC PANEL  TROPONIN I  APTT  PROTIME-INR    Imaging Review Dg Chest Port 1 View  07/24/2014   CLINICAL DATA:  Chest pain and shortness of breath.  EXAM: PORTABLE CHEST - 1 VIEW  COMPARISON:  06/03/2013  FINDINGS: Geographic area of increased density at the right lung base appears to represent multiple calcified areas. This was not present by prior chest x-ray and it is unclear whether this is within the lung or potentially lies outside of the lung. This would be an unusual appearance for pneumonia. A PA and lateral chest x-ray would initially be helpful in further localizing this density.  There is no evidence of pulmonary edema, pneumothorax or pleural fluid. The heart size and mediastinal contours are normal.  IMPRESSION: Cluster of calcific appearing densities projecting over the right lung base. It is unclear whether this lies in the lung or potentially elsewhere in the body. Initially, a PA and lateral chest x-ray would be helpful in further localizing this abnormality.   Electronically Signed   By: Irish Lack M.D.   On: 07/24/2014 12:28     EKG Interpretation   Date/Time:  Saturday July 24 2014 11:32:06 EST Ventricular Rate:  76 PR Interval:  138 QRS Duration: 73 QT Interval:  415 QTC Calculation: 467 R Axis:   -19 Text Interpretation:  Sinus rhythm Borderline left axis deviation Repol  abnrm suggests ischemia, lateral leads T wave inversions laterally,  new  Confirmed by Gwendolyn Grant  MD, BLAIR ([id number]) on 07/24/2014 12:29:21 PM     I spoke with Dr. Alberteen Spindle of cardiology who agrees to accept patient in transfer to the stepdown [facility]  CRITICAL CARE Performed by: Carlyle Dolly Total critical care time: 35 minutes Critical care time was exclusive of separately billable procedures and treating other patients. Critical care was necessary to treat or prevent imminent or life-threatening deterioration. Critical care was time spent personally by me on the following activities: development of treatment plan with patient and/or surrogate as well as nursing, discussions with consultants, evaluation of patient's response to treatment, examination of patient, obtaining history from patient or surrogate, ordering and performing treatments and interventions, ordering and review of laboratory studies, ordering and review of radiographic studies, pulse oximetry and re-evaluation of patient's condition. a  Patient was started on a heparin drip and a nitro drip and she is having a non-STEMI.  The patient and they will remember rate, aware the findings.  After discussions with  them after reviewing all of the material in laboratory data     Carlyle Dolly, PA-C 07/24/14 1439  Elwin Mocha, MD 07/25/14 [phone]

## 2014-07-25 DIAGNOSIS — E876 Hypokalemia: Secondary | ICD-10-CM

## 2014-07-25 LAB — LIPID PANEL
CHOLESTEROL: 241 mg/dL — AB (ref 0–200)
HDL: 33 mg/dL — AB (ref >39)
LDL Cholesterol: 169 mg/dL — ABNORMAL HIGH (ref 0–99)
TRIGLYCERIDES: 195 mg/dL — AB (ref <150)
Total CHOL/HDL Ratio: 7.3 RATIO
VLDL: 39 mg/dL (ref 0–40)

## 2014-07-25 LAB — BASIC METABOLIC PANEL
ANION GAP: 5 (ref 5–15)
BUN: 17 mg/dL (ref 6–23)
CALCIUM: 9.1 mg/dL (ref 8.4–10.5)
CHLORIDE: 108 meq/L (ref 96–112)
CO2: 26 mmol/L (ref 19–32)
CREATININE: 0.85 mg/dL (ref 0.50–1.10)
GFR calc Af Amer: 73 mL/min — ABNORMAL LOW (ref >90)
GFR, EST NON AFRICAN AMERICAN: 63 mL/min — AB (ref >90)
GLUCOSE: 118 mg/dL — AB (ref 70–99)
Potassium: 3.3 mmol/L — ABNORMAL LOW (ref 3.5–5.1)
Sodium: 139 mmol/L (ref 135–145)

## 2014-07-25 LAB — GLUCOSE, CAPILLARY
GLUCOSE-CAPILLARY: 150 mg/dL — AB (ref 70–99)
GLUCOSE-CAPILLARY: 144 mg/dL — AB (ref 70–99)
Glucose-Capillary: 153 mg/dL — ABNORMAL HIGH (ref 70–99)

## 2014-07-25 LAB — CBC
HEMATOCRIT: 37.4 % (ref 36.0–46.0)
Hemoglobin: 12.5 g/dL (ref 12.0–15.0)
MCH: 29.8 pg (ref 26.0–34.0)
MCHC: 33.4 g/dL (ref 30.0–36.0)
MCV: 89 fL (ref 78.0–100.0)
Platelets: 246 10*3/uL (ref 150–400)
RBC: 4.2 MIL/uL (ref 3.87–5.11)
RDW: 13.3 % (ref 11.5–15.5)
WBC: 8.3 10*3/uL (ref 4.0–10.5)

## 2014-07-25 LAB — PROTIME-INR
INR: 1.05 (ref 0.00–1.49)
Prothrombin Time: 13.8 seconds (ref 11.6–15.2)

## 2014-07-25 LAB — HEPARIN LEVEL (UNFRACTIONATED)
Heparin Unfractionated: 0.4 IU/mL (ref 0.30–0.70)
Heparin Unfractionated: 0.57 IU/mL (ref 0.30–0.70)

## 2014-07-25 LAB — C-REACTIVE PROTEIN: CRP: <0.5 mg/dL — ABNORMAL LOW (ref <0.60)

## 2014-07-25 LAB — HEMOGLOBIN A1C
HEMOGLOBIN A1C: 7.2 % — AB (ref <5.7)
MEAN PLASMA GLUCOSE: 160 mg/dL — AB (ref <117)

## 2014-07-25 LAB — SEDIMENTATION RATE: Sed Rate: 25 mm/hr — ABNORMAL HIGH (ref 0–22)

## 2014-07-25 MED ORDER — POTASSIUM CHLORIDE CRYS ER 20 MEQ PO TBCR
40.0000 meq | EXTENDED_RELEASE_TABLET | ORAL | Status: AC
  Administered 2014-07-25 (×2): 40 meq via ORAL
  Filled 2014-07-25 (×2): qty 2

## 2014-07-25 NOTE — Progress Notes (Signed)
ANTICOAGULATION CONSULT NOTE - Follow Up Consult  Pharmacy Consult for heparin Indication: NSTEMI  Allergies  Allergen Reactions  . Clindamycin/Lincomycin Swelling    Tongue swelling, angioedema    Patient Measurements: Height:  (167.6 cm) Weight: 168 lb 10.4 oz (76.5 kg) IBW/kg (Calculated) : 59.3 Heparin Dosing Weight: 75 kg  Vital Signs: Temp: 98.9 F (37.2 C) (01/02 1954) Temp Source: Oral (01/02 1954) BP: 129/94 mmHg (01/02 2300) Pulse Rate: 56 (01/02 2300)  Labs:  Recent Labs  07/24/14 1210 07/24/14 2121 07/25/14 0230  HGB 14.3  --  12.5  HCT 41.5  --  37.4  PLT 260  --  246  APTT 30  --   --   LABPROT 13.0  --  13.8  INR 0.97  --  1.05  HEPARINUNFRC  --  0.28* 0.40  CREATININE 0.93  --  0.85  TROPONINI 3.51*  --   --     Estimated Creatinine Clearance: 55.2 mL/min (by C-G formula based on Cr of 0.85).   Medications:  Infusions:  . sodium chloride    . heparin 1,050 Units/hr (07/24/14 2244)  . nitroGLYCERIN 5 mcg/min (07/24/14 1452)  . nitroGLYCERIN      Assessment: 81 y/o female who presented to [facility] ED with CP and was started on IV heparin. Ruled in for NSTEMI. Heparin level is now therapeutic at 0.4 but level was drawn 2 hours early. H/H and Plt wnl. RN reports no s/s of bleeding. Cath planned.   Goal of Therapy:  Heparin level 0.3-0.7 units/ml Monitor platelets by anticoagulation protocol: Yes   Plan:  - Continue heparin drip at 1050 units/hr - F/u 8 hr HL  - Monitor for s/sx of bleeding  Vinnie Level, PharmD., BCPS Clinical Pharmacist Pager [phone]

## 2014-07-25 NOTE — Progress Notes (Signed)
ANTICOAGULATION CONSULT NOTE - Follow Up Consult  Pharmacy Consult for heparin Indication: NSTEMI  Allergies  Allergen Reactions  . Clindamycin/Lincomycin Swelling    Tongue swelling, angioedema    Patient Measurements: Height:  (167.6 cm) Weight: 168 lb 10.4 oz (76.5 kg) IBW/kg (Calculated) : 59.3 Heparin Dosing Weight: 75 kg  Vital Signs: Temp: 98.4 F (36.9 C) (01/03 1226) Temp Source: Oral (01/03 1226) BP: 110/85 mmHg (01/03 1226) Pulse Rate: 50 (01/03 0700)  Labs:  Recent Labs  07/24/14 1210 07/24/14 2121 07/25/14 0230 07/25/14 1128  HGB 14.3  --  12.5  --   HCT 41.5  --  37.4  --   PLT 260  --  246  --   APTT 30  --   --   --   LABPROT 13.0  --  13.8  --   INR 0.97  --  1.05  --   HEPARINUNFRC  --  0.28* 0.40 0.57  CREATININE 0.93  --  0.85  --   TROPONINI 3.51*  --   --   --     Estimated Creatinine Clearance: 55.2 mL/min (by C-G formula based on Cr of 0.85).   Medications:  Infusions:  . sodium chloride    . heparin 1,050 Units/hr (07/24/14 2244)  . nitroGLYCERIN 5 mcg/min (07/24/14 1452)  . nitroGLYCERIN      Assessment: 81 y/o female who presented to [facility] ED with CP and was started on IV heparin. Ruled in for NSTEMI. Heparin level is now therapeutic. H/H and Plt wnl. RN reports no s/s of bleeding. Cath planned.   Goal of Therapy:  Heparin level 0.3-0.7 units/ml Monitor platelets by anticoagulation protocol: Yes   Plan:  - Continue heparin drip at 1050 units/hr - Daily heparin level and CBC. - Monitor for s/sx of bleeding  Tad Moore, BCPS  Clinical Pharmacist Pager [phone]  07/25/2014 1:28 PM

## 2014-07-25 NOTE — Progress Notes (Signed)
Patient Name: Jo Larson      SUBJECTIVE:* 81 yo admitted yesterday with chest pain and + troponin   Recent episode of angioedema the culprit of which was never quite elucidated  Has had episodes of profound drenching diaphoresis that has been recurring for yearts   Past Medical History  Diagnosis Date  . Diabetes mellitus without complication   . Hypertension   . Anxiety   . Hypothyroidism   . Angioedema   . Hyperlipemia     Scheduled Meds:  Scheduled Meds: . aspirin EC  81 mg Oral Daily  . atorvastatin  80 mg Oral q1800  . levothyroxine  50 mcg Oral QAC breakfast  . LORazepam  1 mg Oral QHS  . metFORMIN  500 mg Oral BID WC  . metoprolol tartrate  12.5 mg Oral BID  . pantoprazole  40 mg Oral Q breakfast  . potassium chloride  40 mEq Oral Q4H  . sodium chloride  3 mL Intravenous Q12H   Continuous Infusions: . sodium chloride    . heparin 1,050 Units/hr (07/24/14 2244)  . nitroGLYCERIN 5 mcg/min (07/24/14 1452)  . nitroGLYCERIN     sodium chloride, acetaminophen, nitroGLYCERIN, ondansetron (ZOFRAN) IV, sodium chloride    PHYSICAL EXAM Filed Vitals:   07/25/14 0400 07/25/14 0500 07/25/14 0600 07/25/14 0700  BP: 124/54 127/53 159/58 131/81  Pulse: 51 52 50 50  Temp:    98.1 F (36.7 C)  TempSrc:    Oral  Resp:      Height:      Weight: 168 lb 10.4 oz (76.5 kg)     SpO2: 91% 96% 89% 92%    Well developed and nourished in no acute distress HENT normal Neck supple with JVP-flat Clear Regular rate and rhythm, no murmurs or gallops Abd-soft with active BS No Clubbing cyanosis edema Skin-warm and dry A & Oriented  Grossly normal sensory and motor function  TELEMETRY: Reviewed telemetry pt in  nsr   Intake/Output Summary (Last 24 hours) at 07/25/14 1104 Last data filed at 07/25/14 1000  Gross per 24 hour  Intake  713.7 ml  Output    900 ml  Net -186.3 ml    LABS: Basic Metabolic Panel:  Recent Labs Lab 07/24/14 1210  07/25/14 0230  NA 138 139  K 4.1 3.3*  CL 104 108  CO2 24 26  GLUCOSE 143* 118*  BUN 24* 17  CREATININE 0.93 0.85  CALCIUM 9.9 9.1   Cardiac Enzymes:  Recent Labs  07/24/14 1210  TROPONINI 3.51*   CBC:  Recent Labs Lab 07/24/14 1210 07/25/14 0230  WBC 9.1 8.3  HGB 14.3 12.5  HCT 41.5 37.4  MCV 89.1 89.0  PLT 260 246   PROTIME:  Recent Labs  07/24/14 1210 07/25/14 0230  LABPROT 13.0 13.8  INR 0.97 1.05   Liver Function Tests: No results for input(s): AST, ALT, ALKPHOS, BILITOT, PROT, ALBUMIN in the last 72 hours. No results for input(s): LIPASE, AMYLASE in the last 72 hours. BNP: BNP (last 3 results) No results for input(s): PROBNP in the last 8760 hours. D-Dimer: No results for input(s): DDIMER in the last 72 hours. Hemoglobin A1C: No results for input(s): HGBA1C in the last 72 hours. Fasting Lipid Panel:  Recent Labs  07/25/14 0230  CHOL 241*  HDL 33*  LDLCALC 169*  TRIG 195*  CHOLHDL 7.3   Thyroid Function Tests:  Recent Labs  07/24/14 2121  TSH 3.363   Anemia Panel:  ASSESSMENT AND PLAN:  Active Problems:   Non-STEMI (non-ST elevated myocardial infarction)   Hypokalemia  Orders written for cath in am Continue heparin statins asa Not on dual therapy Will recheck tropnin Replete K diaphoesis workup as outpt per PCP Will check ESR and CRP   Signed, Virl Axe MD  07/25/2014

## 2014-07-26 ENCOUNTER — Encounter (HOSPITAL_COMMUNITY): Payer: Self-pay | Admitting: Cardiovascular Disease

## 2014-07-26 ENCOUNTER — Encounter (HOSPITAL_COMMUNITY)
Admission: EM | Disposition: A | Discharge status: Home / Self Care | Payer: Medicaid Other | Source: Home / Self Care | Attending: Cardiology

## 2014-07-26 DIAGNOSIS — I2510 Atherosclerotic heart disease of native coronary artery without angina pectoris: Secondary | ICD-10-CM

## 2014-07-26 HISTORY — PX: LEFT HEART CATHETERIZATION WITH CORONARY ANGIOGRAM: SHX5451

## 2014-07-26 LAB — BASIC METABOLIC PANEL
ANION GAP: 11 (ref 5–15)
BUN: 21 mg/dL (ref 6–23)
CALCIUM: 9.4 mg/dL (ref 8.4–10.5)
CHLORIDE: 111 meq/L (ref 96–112)
CO2: 20 mmol/L (ref 19–32)
CREATININE: 1.07 mg/dL (ref 0.50–1.10)
GFR calc non Af Amer: 48 mL/min — ABNORMAL LOW (ref >90)
GFR, EST AFRICAN AMERICAN: 55 mL/min — AB (ref >90)
Glucose, Bld: 140 mg/dL — ABNORMAL HIGH (ref 70–99)
Potassium: 4.1 mmol/L (ref 3.5–5.1)
Sodium: 142 mmol/L (ref 135–145)

## 2014-07-26 LAB — CBC
HCT: 37 % (ref 36.0–46.0)
Hemoglobin: 12 g/dL (ref 12.0–15.0)
MCH: 29.3 pg (ref 26.0–34.0)
MCHC: 32.4 g/dL (ref 30.0–36.0)
MCV: 90.2 fL (ref 78.0–100.0)
PLATELETS: 228 10*3/uL (ref 150–400)
RBC: 4.1 MIL/uL (ref 3.87–5.11)
RDW: 13.6 % (ref 11.5–15.5)
WBC: 7.5 10*3/uL (ref 4.0–10.5)

## 2014-07-26 LAB — POCT ACTIVATED CLOTTING TIME: Activated Clotting Time: 497 seconds

## 2014-07-26 LAB — TROPONIN I: Troponin I: 1.12 ng/mL (ref <0.031)

## 2014-07-26 LAB — HEPARIN LEVEL (UNFRACTIONATED): Heparin Unfractionated: 0.69 IU/mL (ref 0.30–0.70)

## 2014-07-26 LAB — GLUCOSE, CAPILLARY
GLUCOSE-CAPILLARY: 113 mg/dL — AB (ref 70–99)
GLUCOSE-CAPILLARY: 151 mg/dL — AB (ref 70–99)
Glucose-Capillary: 104 mg/dL — ABNORMAL HIGH (ref 70–99)
Glucose-Capillary: 118 mg/dL — ABNORMAL HIGH (ref 70–99)

## 2014-07-26 SURGERY — LEFT HEART CATHETERIZATION WITH CORONARY ANGIOGRAM
Anesthesia: LOCAL

## 2014-07-26 MED ORDER — ASPIRIN 81 MG PO CHEW: 81.0000 mg | CHEWABLE_TABLET | Freq: Every day | ORAL | Status: DC

## 2014-07-26 MED ORDER — SODIUM CHLORIDE 0.9 % IV SOLN
0.2500 mg/kg/h | INTRAVENOUS | Status: AC
  Administered 2014-07-26: 0.25 mg/kg/h via INTRAVENOUS
  Filled 2014-07-26: qty 250

## 2014-07-26 MED ORDER — VERAPAMIL HCL 2.5 MG/ML IV SOLN
INTRAVENOUS | Status: AC
  Filled 2014-07-26: qty 2

## 2014-07-26 MED ORDER — ALPRAZOLAM 0.25 MG PO TABS: 0.2500 mg | ORAL_TABLET | Freq: Three times a day (TID) | ORAL | Status: DC | PRN

## 2014-07-26 MED ORDER — TICAGRELOR 90 MG PO TABS
90.0000 mg | ORAL_TABLET | Freq: Two times a day (BID) | ORAL | Status: DC
  Administered 2014-07-27 (×2): 90 mg via ORAL
  Filled 2014-07-26 (×3): qty 1

## 2014-07-26 MED ORDER — HYDRALAZINE HCL 20 MG/ML IJ SOLN: 10.0000 mg | INTRAMUSCULAR | Status: DC | PRN

## 2014-07-26 MED ORDER — NITROGLYCERIN 1 MG/10 ML FOR IR/CATH LAB
INTRA_ARTERIAL | Status: AC
  Filled 2014-07-26: qty 10

## 2014-07-26 MED ORDER — MIDAZOLAM HCL 2 MG/2ML IJ SOLN
INTRAMUSCULAR | Status: AC
  Filled 2014-07-26: qty 2

## 2014-07-26 MED ORDER — HEPARIN SODIUM (PORCINE) 1000 UNIT/ML IJ SOLN
INTRAMUSCULAR | Status: AC
  Filled 2014-07-26: qty 1

## 2014-07-26 MED ORDER — ACETAMINOPHEN 325 MG PO TABS: 650.0000 mg | ORAL_TABLET | ORAL | Status: DC | PRN

## 2014-07-26 MED ORDER — LIDOCAINE HCL (PF) 1 % IJ SOLN
INTRAMUSCULAR | Status: AC
  Filled 2014-07-26: qty 30

## 2014-07-26 MED ORDER — ONDANSETRON HCL 4 MG/2ML IJ SOLN: 4.0000 mg | Freq: Four times a day (QID) | INTRAMUSCULAR | Status: DC | PRN

## 2014-07-26 MED ORDER — MORPHINE SULFATE 2 MG/ML IJ SOLN
2.0000 mg | INTRAMUSCULAR | Status: DC | PRN
  Administered 2014-07-26 (×2): 2 mg via INTRAVENOUS
  Filled 2014-07-26 (×2): qty 1

## 2014-07-26 MED ORDER — BIVALIRUDIN 250 MG IV SOLR
INTRAVENOUS | Status: AC
  Filled 2014-07-26: qty 250

## 2014-07-26 MED ORDER — TICAGRELOR 90 MG PO TABS
ORAL_TABLET | ORAL | Status: AC
  Filled 2014-07-26: qty 2

## 2014-07-26 MED ORDER — HYDRALAZINE HCL 20 MG/ML IJ SOLN
INTRAMUSCULAR | Status: AC
  Administered 2014-07-26: 10 mg
  Filled 2014-07-26: qty 1

## 2014-07-26 MED ORDER — HEPARIN (PORCINE) IN NACL 2-0.9 UNIT/ML-% IJ SOLN
INTRAMUSCULAR | Status: AC
  Filled 2014-07-26: qty 1500

## 2014-07-26 MED ORDER — SODIUM CHLORIDE 0.9 % IV SOLN
INTRAVENOUS | Status: AC
  Administered 2014-07-26: 15:00:00 via INTRAVENOUS

## 2014-07-26 MED ORDER — FENTANYL CITRATE 0.05 MG/ML IJ SOLN
INTRAMUSCULAR | Status: AC
  Filled 2014-07-26: qty 2

## 2014-07-26 NOTE — Interval H&P Note (Signed)
Cath Lab Visit (complete for each Cath Lab visit)  Clinical Evaluation Leading to the Procedure:   ACS: Yes.    Non-ACS:    Anginal Classification: CCS IV  Anti-ischemic medical therapy: No Therapy  Non-Invasive Test Results: No non-invasive testing performed  Prior CABG: No previous CABG      History and Physical Interval Note:  07/26/2014 1:14 PM  Jo Larson  has presented today for surgery, with the diagnosis of NSTEMI  The various methods of treatment have been discussed with the patient and family. After consideration of risks, benefits and other options for treatment, the patient has consented to  Procedure(s): LEFT HEART CATHETERIZATION WITH CORONARY ANGIOGRAM (N/A) as a surgical intervention .  The patient's history has been reviewed, patient examined, no change in status, stable for surgery.  I have reviewed the patient's chart and labs.  Questions were answered to the patient's satisfaction.     Runell Gess

## 2014-07-26 NOTE — Progress Notes (Signed)
ANTICOAGULATION CONSULT NOTE - Follow Up Consult  Pharmacy Consult for Heparin Indication: NSTEMI  Allergies  Allergen Reactions  . Clindamycin/Lincomycin Swelling    Tongue swelling, angioedema    Patient Measurements: Height:  (167.6 cm) Weight: 168 lb 10.4 oz (76.5 kg) IBW/kg (Calculated) : 59.3 Heparin Dosing Weight: 75kg  Vital Signs: Temp: 98 F (36.7 C) (01/04 0826) Temp Source: Oral (01/04 0826) BP: 170/133 mmHg (01/04 0826) Pulse Rate: 64 (01/04 0826)  Labs:  Recent Labs  07/24/14 1210  07/25/14 0230 07/25/14 1128 07/25/14 1150 07/26/14 0250 07/26/14 0256  HGB 14.3  --  12.5  --   --  12.0  --   HCT 41.5  --  37.4  --   --  37.0  --   PLT 260  --  246  --   --  228  --   APTT 30  --   --   --   --   --   --   LABPROT 13.0  --  13.8  --   --   --   --   INR 0.97  --  1.05  --   --   --   --   HEPARINUNFRC  --   < > 0.40 0.57  --   --  0.69  CREATININE 0.93  --  0.85  --   --  1.07  --   TROPONINI 3.51*  --   --   --  1.12*  --   --   < > = values in this interval not displayed.  Estimated Creatinine Clearance: 43.8 mL/min (by C-G formula based on Cr of 1.07).   Medications:  Heparin @ 1050 units/hr  Assessment: 82yof continues on heparin for NSTEMI with plans for cath today. Heparin level is at goal. CBC is stable. No bleeding reported.   Goal of Therapy:  Heparin level 0.3-0.7 units/ml Monitor platelets by anticoagulation protocol: Yes   Plan:  1) Continue heparin at 1050 units/hr 2) Follow up after cath  Fredrik Rigger 07/26/2014,11:01 AM

## 2014-07-26 NOTE — Care Management Note (Addendum)
    Page 1 of 1   07/27/2014     10:58:35 AM CARE MANAGEMENT NOTE 07/27/2014  Patient:  Jo Larson, Jo Larson   Account Number:  [id number]  Date Initiated:  07/26/2014  Documentation initiated by:  Junius Creamer  Subjective/Objective Assessment:   adm w mi     Action/Plan:   lives w fam   Anticipated DC Date:     Anticipated DC Plan:  HOME/SELF CARE      DC Planning Services  Medication Assistance      Choice offered to / List presented to:             Status of service:   Medicare Important Message given?  YES (If response is "NO", the following Medicare IM given date fields will be blank) Date Medicare IM given:  07/27/2014 Medicare IM given by:  Junius Creamer Date Additional Medicare IM given:   Additional Medicare IM given by:    Discharge Disposition:    Per UR Regulation:  Reviewed for med. necessity/level of care/duration of stay  If discussed at Long Length of Stay Meetings, dates discussed:    Comments:  1/4 1439 debbie Jo Waidelich rn,bsn gave pt 30day free brilinta card. will have 3.60 copay w no prior auth req.

## 2014-07-26 NOTE — CV Procedure (Signed)
Jo Larson is a 81 y.o. female    [id number] LOCATION:  FACILITY: [facility]  PHYSICIAN: Quay Burow, M.D. 15-Aug-1931   DATE OF PROCEDURE:  07/26/2014  DATE OF DISCHARGE:     CARDIAC CATHETERIZATION     History obtained from chart review. Jo Larson is an 81 year old widowed [place] female with a history of hypertension, diabetes and hyperlipidemia who presented on 07/24/13 with chest pain, EKG changes and positive enzymes. She was placed on IV heparin and nitroglycerin. She had no further symptoms. She presents now for cardiac catheterization to define her anatomy.   PROCEDURE DESCRIPTION:   The patient was brought to the second floor [facility] cath lab in the postabsorptive state. She was premedicated with Valium 5 mg by mouth, IV Versed and fentanyl. Her right wrist was prepped and shaved in usual sterile fashion. Xylocaine 1% was used for local anesthesia. A 6 French sheath was inserted into the right radial artery using standard Seldinger technique. The patient received 4000 units  of heparin  intravenously.  A 5 [place] TIG catheter and pigtail catheters were used for selective coronary angiography and left ventriculography respectively. Visipaque dye was used for the entirety of the case (170 mL total to patient). Retrograde aortic, left ventricular and pullback pressures were recorded.    HEMODYNAMICS:    AO SYSTOLIC/AO DIASTOLIC: 300/51   LV SYSTOLIC/LV DIASTOLIC: 102/11  ANGIOGRAPHIC RESULTS:   1. Left main; normal  2. LAD; minor irregularities 3. Left circumflex; this is a large vessel though nondominant. It was the "culprit vessel). There was a 95% fairly focal proximal to mid AV groove stenosis. There was a 90% PDA branch stenosis and a relatively small vessel..  4. Right coronary artery; codominant and free of significant disease 5. Left ventriculography; RAO left ventriculogram was performed using  25 mL of Visipaque dye at 12 mL/second. The overall LVEF  estimated  60 %  Without wall motion abnormalities  IMPRESSION:high-grade proximal to mid AV groove stenosis probably representing "the culprit lesion with normal LV function. We will proceed with PCI and stenting using Angiomax, and Brilenta.  Procedure description: Using a 6 [place] XB 3 guide catheter along with a 0.14/190 cm long pro-water guidewire and a 2 mm x 12 mm long balloon predilatation was performed. The patient received 180 mg of Brilenta by mouth. She was already on aspirin. She received into max bolus with an ACT of 497. After predilatation stenting was performed with a 4 mm x 18 mm long Xience drug-eluting stent deployed at 12 atm and postdilated with a 4 mm x 15 mm nominal long noncompliant balloon at 18 atm (4.2 mm) resulting reduction of a 95% focal proximal to mid AV groove stenosis to 0% residual. The patient tolerated the procedure well. She has residual 90% stenosis in the small PDA probably less than 2 mm in diameter which will be treated medically. The guidewire and sheath were removed. A TR band was placed on the right wrist to achieve patent hemostasis. The patient left the lab in stable condition.  Final impression: Successful PCI and stenting of high-grade proximal to mid AV groove circumflex stenosis to 0% residual with a drug eluting stent, Angiomax and Brilenta. The patient will most likely be discharged home tomorrow. She will need a minimum of 1 year of uninterrupted dual antiplatelet therapy.  Jo Harp MD, [facility] 07/26/2014 2:26 PM

## 2014-07-27 ENCOUNTER — Telehealth: Payer: Self-pay | Admitting: Cardiology

## 2014-07-27 ENCOUNTER — Encounter (HOSPITAL_COMMUNITY): Payer: Self-pay | Admitting: Nurse Practitioner

## 2014-07-27 DIAGNOSIS — I25110 Atherosclerotic heart disease of native coronary artery with unstable angina pectoris: Secondary | ICD-10-CM

## 2014-07-27 DIAGNOSIS — R9389 Abnormal findings on diagnostic imaging of other specified body structures: Secondary | ICD-10-CM | POA: Diagnosis present

## 2014-07-27 DIAGNOSIS — R938 Abnormal findings on diagnostic imaging of other specified body structures: Secondary | ICD-10-CM

## 2014-07-27 DIAGNOSIS — I10 Essential (primary) hypertension: Secondary | ICD-10-CM

## 2014-07-27 DIAGNOSIS — Z9861 Coronary angioplasty status: Secondary | ICD-10-CM

## 2014-07-27 DIAGNOSIS — E118 Type 2 diabetes mellitus with unspecified complications: Secondary | ICD-10-CM

## 2014-07-27 DIAGNOSIS — E785 Hyperlipidemia, unspecified: Secondary | ICD-10-CM

## 2014-07-27 DIAGNOSIS — I369 Nonrheumatic tricuspid valve disorder, unspecified: Secondary | ICD-10-CM

## 2014-07-27 DIAGNOSIS — I2510 Atherosclerotic heart disease of native coronary artery without angina pectoris: Secondary | ICD-10-CM

## 2014-07-27 LAB — CBC
HEMATOCRIT: 34.9 % — AB (ref 36.0–46.0)
HEMOGLOBIN: 11.2 g/dL — AB (ref 12.0–15.0)
MCH: 28.6 pg (ref 26.0–34.0)
MCHC: 32.1 g/dL (ref 30.0–36.0)
MCV: 89 fL (ref 78.0–100.0)
PLATELETS: 231 10*3/uL (ref 150–400)
RBC: 3.92 MIL/uL (ref 3.87–5.11)
RDW: 13.7 % (ref 11.5–15.5)
WBC: 8.3 10*3/uL (ref 4.0–10.5)

## 2014-07-27 LAB — BASIC METABOLIC PANEL
ANION GAP: 7 (ref 5–15)
BUN: 15 mg/dL (ref 6–23)
CALCIUM: 8.7 mg/dL (ref 8.4–10.5)
CO2: 23 mmol/L (ref 19–32)
CREATININE: 0.88 mg/dL (ref 0.50–1.10)
Chloride: 110 mEq/L (ref 96–112)
GFR calc Af Amer: 70 mL/min — ABNORMAL LOW (ref >90)
GFR calc non Af Amer: 60 mL/min — ABNORMAL LOW (ref >90)
Glucose, Bld: 114 mg/dL — ABNORMAL HIGH (ref 70–99)
Potassium: 3.9 mmol/L (ref 3.5–5.1)
Sodium: 140 mmol/L (ref 135–145)

## 2014-07-27 LAB — GLUCOSE, CAPILLARY
GLUCOSE-CAPILLARY: 94 mg/dL (ref 70–99)
Glucose-Capillary: 111 mg/dL — ABNORMAL HIGH (ref 70–99)

## 2014-07-27 MED ORDER — LOSARTAN POTASSIUM 25 MG PO TABS: 25.0000 mg | ORAL_TABLET | Freq: Every day | ORAL | Status: DC

## 2014-07-27 MED ORDER — ATORVASTATIN CALCIUM 80 MG PO TABS: 80.0000 mg | ORAL_TABLET | Freq: Every day | ORAL | Status: DC

## 2014-07-27 MED ORDER — METFORMIN HCL 500 MG PO TABS: 500.0000 mg | ORAL_TABLET | Freq: Two times a day (BID) | ORAL | Status: DC

## 2014-07-27 MED ORDER — METOPROLOL TARTRATE 25 MG PO TABS: 12.5000 mg | ORAL_TABLET | Freq: Two times a day (BID) | ORAL | Status: DC

## 2014-07-27 MED ORDER — TICAGRELOR 90 MG PO TABS: 90.0000 mg | ORAL_TABLET | Freq: Two times a day (BID) | ORAL | Status: DC

## 2014-07-27 MED ORDER — NITROGLYCERIN 0.4 MG SL SUBL: 0.4000 mg | SUBLINGUAL_TABLET | SUBLINGUAL | Status: DC | PRN

## 2014-07-27 MED ORDER — ASPIRIN EC 81 MG PO TBEC: 81.0000 mg | DELAYED_RELEASE_TABLET | Freq: Once | ORAL | Status: DC

## 2014-07-27 MED FILL — Sodium Chloride IV Soln 0.9%: INTRAVENOUS | Qty: 50 | Status: AC

## 2014-07-27 NOTE — Progress Notes (Signed)
CARDIAC REHAB PHASE I   PRE:  Rate/Rhythm: 55 SB  BP:  Supine: 166/53  Sitting:   Standing:    SaO2: 96%RA  MODE:  Ambulation: 175 ft   POST:  Rate/Rhythm: 75 SR  BP:  Supine:   Sitting: 158/46  Standing:    SaO2: 96%RA 0920-1020 Pt did not want to walk but with encouragement of family member, she agreed. Pt walked 175 ft with handheld asst by myself and daughter-in-law. Denied CP. To recliner after walk. Stressed importance of brilinta with stent and they have booklet. Daughter -in-law interpreted for pt as form was signed indicating that pt wanted family to interpret. Reviewed MI restrictions, use of NTG, diet tips and modified ex which is walking with assistance. Declined CRP 2 as family busy and could not transport pt.    Luetta Nuttingharlene Laquentin Loudermilk, RN BSN  07/27/2014 10:17 AM

## 2014-07-27 NOTE — Telephone Encounter (Signed)
New message     TCM appt on  1/12 @ 8 am . Per Thayer Ohmhris B.

## 2014-07-27 NOTE — Discharge Instructions (Signed)

## 2014-07-27 NOTE — Progress Notes (Signed)
  Echocardiogram 2D Echocardiogram has been performed.  Jo Larson, Darleth Eustache M 07/27/2014, 12:11 PM

## 2014-07-27 NOTE — Discharge Summary (Signed)
Discharge Summary   Patient ID: Jo Larson,  MRN: [id number], DOB/AGE: 79/02/1932 79 y.o.  Admit date: 07/24/2014 Discharge date: 07/27/2014  Primary Care Provider: Dr. Andrey CampanileWilson Primary Cardiologist: Shela CommonsJ. Hochrein, MD   Discharge Diagnoses Principal Problem:   Non-STEMI (non-ST elevated myocardial infarction)  **S/P PCI/DES to the Left Circumflex this admission.  Active Problems:   CAD (coronary artery disease)   Type II diabetes mellitus   HYPERTENSION, BENIGN ESSENTIAL   Hypothyroidism   Hyperlipidemia - LDL 169   Hypokalemia - resolved   Abnormal chest x-ray  **Cluster of calcific appearing densities over R lung base - needs f/u PA & Lat CXR as outpt.   History of angioedema felt to be secondary to clindamycin (05/2014)  Allergies Allergies  Allergen Reactions  . Clindamycin/Lincomycin Swelling    Tongue swelling, angioedema   Procedures  Chest X-ray 1.2.2016  IMPRESSION: Cluster of calcific appearing densities projecting over the right lung base. It is unclear whether this lies in the lung or potentially elsewhere in the body. Initially, a PA and lateral chest x-ray would be helpful in further localizing this abnormality. _____________   Cardiac Catheterization 1.4.2016  HEMODYNAMICS:     AO SYSTOLIC/AO DIASTOLIC: 160/65    LV SYSTOLIC/LV DIASTOLIC: 162/13  ANGIOGRAPHIC RESULTS:   1. Left main; normal   2. LAD; minor irregularities 3. Left circumflex; this is a large vessel though nondominant. It was the "culprit vessel). There was a 95% fairly focal proximal to mid AV groove stenosis. There was a 90% PDA branch stenosis and a relatively small vessel (2 mm).  **The LCX was successfully stented using a 4.0 x 18 mm Xience DES.** 4. Right coronary artery; codominant and free of significant disease 5. Left ventriculography; RAO left ventriculogram was performed using   25 mL of Visipaque dye at 12 mL/second. The overall LVEF estimated   60 %  Without wall  motion abnormalities _____________   2D Echocardiogram 1.5.2016  Result pending @ this time. _____________   History of Present Illness  81 year old female without prior cardiac history. She was [facility] November 2015 secondary to respiratory failure and angioedema which was felt to be as result of clindamycin therapy. Following discharge, she apparently has done well but on the morning of admission, she developed sudden onset of chest discomfort radiating to the left arm without other associated symptoms. [facility] where troponin was elevated at 3.51 and ECG demonstrated broad inverted T waves in lateral leads. She was treated with aspirin, nitroglycerin, and heparin and was made to be pain-free. She was admitted for further evaluation.  Hospital Course  Following admission, patient had no current chest pain. She was placed on aspirin, heparin, beta blocker, and high potency statin therapy. Subsequent troponin fell to 1.12. Decision was made to pursue diagnostic catheterization. This took place on January 4 and revealed severe stenosis within the left circumflex, this was felt to be the culprit lesion and was successfully stented using a 4.0 x 18 mm Xience DES.  She also had a 90% stenosis in the left PDA, which was a 2 mm vessel and was felt to be too small for intervention. EF was normal at 60%. Post-PCI, she has been monitored in stepdown and has been ambulating without recurrent symptoms or limitations this morning. As results she'll be discharged home today in good condition.  Of note, on admission chest x-ray, a cluster of calcific appearing densities projecting over the right lung base were noted.  Follow-up PA and lateral chest x-ray were recommended however this was not performed prior to discharge and thus will need to be arranged to be performed as an outpatient.  Discharge Vitals Blood pressure 141/44, pulse 47, temperature 99 F (37.2 C),  temperature source Oral, resp. rate 18, height  (1.676 m), weight 168 lb 10.4 oz (76.5 kg), SpO2 97 %.  Filed Weights   07/24/14 1300 07/24/14 1600 07/25/14 0400  Weight: 173 lb (78.472 kg) 172 lb 2.9 oz (78.1 kg) 168 lb 10.4 oz (76.5 kg)   Labs  CBC  Recent Labs  07/26/14 0250 07/27/14 0226  WBC 7.5 8.3  HGB 12.0 11.2*  HCT 37.0 34.9*  MCV 90.2 89.0  PLT 228 231   Basic Metabolic Panel  Recent Labs  07/26/14 0250 07/27/14 0226  NA 142 140  K 4.1 3.9  CL 111 110  CO2 20 23  GLUCOSE 140* 114*  BUN 21 15  CREATININE 1.07 0.88  CALCIUM 9.4 8.7   Cardiac Enzymes  Recent Labs  07/25/14 1150  TROPONINI 1.12*   Hemoglobin A1C  Recent Labs  07/24/14 2121  HGBA1C 7.2*   Fasting Lipid Panel  Recent Labs  07/25/14 0230  CHOL 241*  HDL 33*  LDLCALC 169*  TRIG 195*  CHOLHDL 7.3   Thyroid Function Tests  Recent Labs  07/24/14 2121  TSH 3.363   Disposition  Pt is being discharged home today in good condition.  Follow-up Plans & Appointments      Follow-up Information    Follow up with Dr. Mayford Knife.   Why:  As scheduled.      Follow up with Norma Fredrickson, NP On 08/03/2014.   Specialty:  Nurse Practitioner   Why:  8:00 AM - Dr. Jenene Slicker Nurse Practitioner   Contact information:   [address] [place] [address] [phone]      Discharge Medications    Medication List    STOP taking these medications        triamterene-hydrochlorothiazide 37.5-25 MG per capsule  Commonly known as:  DYAZIDE      TAKE these medications        aspirin EC 81 MG tablet  Take 1 tablet (81 mg total) by mouth once.     atorvastatin 80 MG tablet  Commonly known as:  LIPITOR  Take 1 tablet (80 mg total) by mouth daily at 6 PM.     cholecalciferol 1000 UNITS tablet  Commonly known as:  VITAMIN D  Take 1,000 Units by mouth daily.     EPINEPHrine 0.3 mg/0.3 mL Soaj injection  Commonly known as:  EPI-PEN  Inject 0.3 mLs (0.3  mg total) into the muscle as needed (for resp distress from angioedema).     esomeprazole 40 MG capsule  Commonly known as:  NEXIUM  Take 40 mg by mouth daily before breakfast.     levothyroxine 50 MCG tablet  Commonly known as:  SYNTHROID, LEVOTHROID  Take 50 mcg by mouth daily before breakfast.     LORazepam 1 MG tablet  Commonly known as:  ATIVAN  Take 1 mg by mouth at bedtime.     losartan 25 MG tablet  Commonly known as:  COZAAR  Take 1 tablet (25 mg total) by mouth daily.     metFORMIN 500 MG tablet  Commonly known as:  GLUCOPHAGE  Take 1 tablet (500 mg total) by mouth 2 (two) times daily with a meal.     metoprolol  tartrate 25 MG tablet  Commonly known as:  LOPRESSOR  Take 0.5 tablets (12.5 mg total) by mouth 2 (two) times daily.     nitroGLYCERIN 0.4 MG SL tablet  Commonly known as:  NITROSTAT  Place 1 tablet (0.4 mg total) under the tongue every 5 (five) minutes x 3 doses as needed for chest pain.     ticagrelor 90 MG Tabs tablet  Commonly known as:  BRILINTA  Take 1 tablet (90 mg total) by mouth 2 (two) times daily.       Outstanding Labs/Studies  F/U Lipids/LFT's in 8 wks. F/U PA and Lat CXR as outpt secondary to abnl CXR on admission.  Duration of Discharge Encounter   Greater than 30 minutes including physician time.  Signed, Nicolasa Ducking NP 07/27/2014, 12:45 PM

## 2014-07-28 NOTE — Telephone Encounter (Signed)
lmtcb

## 2014-07-29 NOTE — Telephone Encounter (Signed)
Patient contacted regarding [facility] on 07/24/14  Patient understands to follow up with provider Norma FredricksonLori Gerhardt NP on 08/03/14 at 8:00 AM at [facility]. Patient understands discharge instructions? Yes Patient understands medications and regiment? Yes Patient understands to bring all medications to this visit? Yes  Pt is doing well no C/O offered.

## 2014-08-02 ENCOUNTER — Encounter: Payer: Self-pay | Admitting: *Deleted

## 2014-08-03 ENCOUNTER — Ambulatory Visit (INDEPENDENT_AMBULATORY_CARE_PROVIDER_SITE_OTHER): Payer: Medicare Other | Admitting: Nurse Practitioner

## 2014-08-03 ENCOUNTER — Ambulatory Visit
Admission: RE | Admit: 2014-08-03 | Discharge: 2014-08-03 | Disposition: A | Discharge status: Home / Self Care | Payer: PRIVATE HEALTH INSURANCE | Source: Ambulatory Visit | Attending: Nurse Practitioner | Admitting: Nurse Practitioner

## 2014-08-03 ENCOUNTER — Encounter: Payer: Self-pay | Admitting: Nurse Practitioner

## 2014-08-03 ENCOUNTER — Ambulatory Visit (INDEPENDENT_AMBULATORY_CARE_PROVIDER_SITE_OTHER): Payer: Self-pay | Admitting: Pharmacist

## 2014-08-03 VITALS — BP 110/60 | HR 75 | Wt 167.2 lb

## 2014-08-03 DIAGNOSIS — I10 Essential (primary) hypertension: Secondary | ICD-10-CM

## 2014-08-03 DIAGNOSIS — E785 Hyperlipidemia, unspecified: Secondary | ICD-10-CM

## 2014-08-03 DIAGNOSIS — R9389 Abnormal findings on diagnostic imaging of other specified body structures: Secondary | ICD-10-CM

## 2014-08-03 DIAGNOSIS — I214 Non-ST elevation (NSTEMI) myocardial infarction: Secondary | ICD-10-CM

## 2014-08-03 DIAGNOSIS — R938 Abnormal findings on diagnostic imaging of other specified body structures: Secondary | ICD-10-CM

## 2014-08-03 DIAGNOSIS — Z955 Presence of coronary angioplasty implant and graft: Secondary | ICD-10-CM

## 2014-08-03 LAB — CBC
HCT: 35.7 % — ABNORMAL LOW (ref 36.0–46.0)
Hemoglobin: 11.9 g/dL — ABNORMAL LOW (ref 12.0–15.0)
MCHC: 33.2 g/dL (ref 30.0–36.0)
MCV: 89.8 fl (ref 78.0–100.0)
Platelets: 279 10*3/uL (ref 150.0–400.0)
RBC: 3.98 Mil/uL (ref 3.87–5.11)
RDW: 13.4 % (ref 11.5–15.5)
WBC: 10.5 10*3/uL (ref 4.0–10.5)

## 2014-08-03 LAB — HEPATIC FUNCTION PANEL
ALT: 18 U/L (ref 0–35)
AST: 19 U/L (ref 0–37)
Albumin: 3.5 g/dL (ref 3.5–5.2)
Alkaline Phosphatase: 49 U/L (ref 39–117)
Bilirubin, Direct: 0.1 mg/dL (ref 0.0–0.3)
Total Bilirubin: 1 mg/dL (ref 0.2–1.2)
Total Protein: 7.3 g/dL (ref 6.0–8.3)

## 2014-08-03 LAB — BASIC METABOLIC PANEL
BUN: 19 mg/dL (ref 6–23)
CO2: 20 mEq/L (ref 19–32)
Calcium: 9.4 mg/dL (ref 8.4–10.5)
Chloride: 110 mEq/L (ref 96–112)
Creatinine, Ser: 0.9 mg/dL (ref 0.4–1.2)
GFR: 61.52 mL/min (ref >60.00)
Glucose, Bld: 141 mg/dL — ABNORMAL HIGH (ref 70–99)
Potassium: 4.7 mEq/L (ref 3.5–5.1)
Sodium: 137 mEq/L (ref 135–145)

## 2014-08-03 NOTE — Progress Notes (Signed)
Pharmacy Transitions of Care Post-ACS Visit  Admit Complaint: 81 y.o. female discharged on 07/27/14 after NSTEMI s/p PCI and DES placement. PMH is significant for CAD, DM2, HTN, hypothyroidism, and HLD. Patient presents to clinic for pharmacy medication reconciliation.  Pt is non-English speaking and presents today with an interpreter and her daughter-in-law. Medications and allergies were reviewed with patient. All medications were brought to today's appointment.  Current Outpatient Prescriptions  Medication Sig Dispense Refill  . aspirin EC 81 MG tablet Take 1 tablet (81 mg total) by mouth once.    Marland Kitchen atorvastatin (LIPITOR) 80 MG tablet Take 1 tablet (80 mg total) by mouth daily at 6 PM. 30 tablet 6  . cholecalciferol (VITAMIN D) 1000 UNITS tablet Take 1,000 Units by mouth daily.    Marland Kitchen EPINEPHrine (EPI-PEN) 0.3 mg/0.3 mL SOAJ injection Inject 0.3 mLs (0.3 mg total) into the muscle as needed (for resp distress from angioedema). 1 Device 3  . esomeprazole (NEXIUM) 40 MG capsule Take 40 mg by mouth daily before breakfast.    . levothyroxine (SYNTHROID, LEVOTHROID) 50 MCG tablet Take 50 mcg by mouth daily before breakfast.    . LORazepam (ATIVAN) 1 MG tablet Take 1 mg by mouth at bedtime.     Marland Kitchen losartan (COZAAR) 25 MG tablet Take 1 tablet (25 mg total) by mouth daily. 30 tablet 6  . metFORMIN (GLUCOPHAGE) 500 MG tablet Take 1 tablet (500 mg total) by mouth 2 (two) times daily with a meal.    . metoprolol tartrate (LOPRESSOR) 25 MG tablet Take 0.5 tablets (12.5 mg total) by mouth 2 (two) times daily. 30 tablet 6  . nitroGLYCERIN (NITROSTAT) 0.4 MG SL tablet Place 1 tablet (0.4 mg total) under the tongue every 5 (five) minutes x 3 doses as needed for chest pain. 25 tablet 3  . ticagrelor (BRILINTA) 90 MG TABS tablet Take 1 tablet (90 mg total) by mouth 2 (two) times daily. 60 tablet 6   No current facility-administered medications for this visit.    ACS Medication Checklist:     Medication       YES      NO N/A or C/I and Explanation  Beta Blocker     ACEi or ARB      High-dose statin    (atorvastatin 40 or , rosuvastatin 20 or )     Clopidogrel +/- other         anti-platelets             Aspirin     Nitroglycerin      Smoking cessation counseling provided     Cardiac rehab referral              Assessment/Plan: refer to chart below.    Interventions during today's visit include: Intervention     YES      NO  Explanation   Indications      Needs Therapy      Unnecessary Therapy     Efficacy     Suboptimal Drug Selection     Insufficient Dose/Duration      Safety      Adverse Drug Event      Drug Interaction      Excessive Dose/Duration     Compliance     Underuse      Overuse      Medications reviewed briefly today with patient and daughter-in-law, who came with an organized medication list. Pt and family have no medication concerns today.  Megan E.  Supple, Pharm.D Clinical Pharmacy Resident Pager: [phone] 08/03/2014 9:21 AM

## 2014-08-03 NOTE — Patient Instructions (Addendum)
We will be checking the following labs today - BMET HPF and CBC  [facility] on the first floor for a repeat chest Xray - you may walk in.   Stay on your current medicines  Use Tylenol for discomfort - NO celebrex, advil, aleve or ibuprofen  See Dr. Antoine PocheHochrein in 4 to 6 weeks with fasting labs.   Call the [facility] office at [phone] if you have any questions, problems or concerns.

## 2014-08-03 NOTE — Progress Notes (Signed)
Jo Larson Date of Birth: 1932/01/27 Medical Record #[id number]  History of Present Illness: Jo Larson is seen back today for a TOC/post hospital visit. Seen for Dr. Percival Spanish. She is an 81 year old female from [place] with type 2 DM, HTN, hypothyroidism, and HLD. She previously had no cardiac history.   She was admitted back in November of 2015 with respiratory failure and angioedema which was felt to be the result of clindamycin therapy. Did well until this recent admission earlier this month.   On January 2nd, she presented back to the hospital with chest pain - had elevated troponin - consistent with NSTEMI and had cardiac cath followed by PCI/DES to the LCX. She has residual disease in the left PDA but this was a 2 mm vessel and was felt to be too small for intervention. EF normal at 60%. CXR noted to be abnormal on admission and needs follow up. Echo results were pending at time of discharge.   Comes back today. Here with an interpreter and her daughter in law. She does not speak any [place]. Doing ok. She denies chest pain. Not short of breath. Tolerating her medicines without issue. Asking about resuming what seems to be Celebrex - instructed to NOT use. Some bruising. Pretty sedentary as a general rule. Poor appetite noted. Sees Dr. York Ram for primary care. She has chronic left arm pain that hurts with movement - most likely from prior fall - this is unchanged.   Current Outpatient Prescriptions  Medication Sig Dispense Refill  . aspirin EC 81 MG tablet Take 1 tablet (81 mg total) by mouth once.    Marland Kitchen atorvastatin (LIPITOR) 80 MG tablet Take 1 tablet (80 mg total) by mouth daily at 6 PM. 30 tablet 6  . cholecalciferol (VITAMIN D) 1000 UNITS tablet Take 1,000 Units by mouth daily.    Marland Kitchen EPINEPHrine (EPI-PEN) 0.3 mg/0.3 mL SOAJ injection Inject 0.3 mLs (0.3 mg total) into the muscle as needed (for resp distress from angioedema). 1 Device 3  . esomeprazole (NEXIUM) 40 MG  capsule Take 40 mg by mouth daily before breakfast.    . levothyroxine (SYNTHROID, LEVOTHROID) 50 MCG tablet Take 50 mcg by mouth daily before breakfast.    . LORazepam (ATIVAN) 1 MG tablet Take 1 mg by mouth at bedtime.     Marland Kitchen losartan (COZAAR) 25 MG tablet Take 1 tablet (25 mg total) by mouth daily. 30 tablet 6  . metFORMIN (GLUCOPHAGE) 500 MG tablet Take 1 tablet (500 mg total) by mouth 2 (two) times daily with a meal.    . metoprolol tartrate (LOPRESSOR) 25 MG tablet Take 0.5 tablets (12.5 mg total) by mouth 2 (two) times daily. 30 tablet 6  . nitroGLYCERIN (NITROSTAT) 0.4 MG SL tablet Place 1 tablet (0.4 mg total) under the tongue every 5 (five) minutes x 3 doses as needed for chest pain. 25 tablet 3  . ticagrelor (BRILINTA) 90 MG TABS tablet Take 1 tablet (90 mg total) by mouth 2 (two) times daily. 60 tablet 6   No current facility-administered medications for this visit.    Allergies  Allergen Reactions  . Clindamycin/Lincomycin Swelling    Tongue swelling, angioedema  . Peanut Butter Flavor Shortness Of Breath    Swelling and airway closes  . Peanuts [Peanut Oil] Shortness Of Breath    Swelling and airway closes    Past Medical History  Diagnosis Date  . Diabetes mellitus without complication   . Hypertension   . Anxiety   .  Hypothyroidism   . Angioedema     a. 05/2014 - felt to be 2/2 clindamycin therapy.  . Hyperlipemia   . CAD (coronary artery disease)     a. 07/2014 NSTEMI/PCI: LM nl, LAD min irregs, LCX 37m(4.0x18 Xience DES), LPDA 90 (267mvessel->med Rx), RCA nl, EF 60%.  . Abnormal chest x-ray     Past Surgical History  Procedure Laterality Date  . Intubation nasotracheal N/A 04/03/2013    Procedure: FIBEROPTIC NASAL-TRACHEAL INTUBATION;  Surgeon: DwMelida QuitterMD;  Location: [facility];  Service: ENT;  Laterality: N/A;  . Left heart catheterization with coronary angiogram N/A 07/26/2014    Procedure: LEFT HEART CATHETERIZATION WITH CORONARY ANGIOGRAM;  Surgeon:  JoLorretta HarpMD;  [facility];  Service: Cardiovascular;  Laterality: N/A;    History  Smoking status  . Never Smoker   Smokeless tobacco  . Never Used    History  Alcohol Use No    Family History  Problem Relation Age of Onset  . Hypertension Father   . Diabetes Father     Review of Systems: The review of systems is per the HPI.  All other systems were reviewed and are negative.  Physical Exam: BP 110/60 mmHg  Pulse 75  Wt 167 lb 3.2 oz (75.841 kg)  SpO2 98% Patient is very pleasant and in no acute distress. Skin is warm and dry. Color is pale/sallow. HEENT is unremarkable. Normocephalic/atraumatic. PERRL. Sclera are nonicteric. Neck is supple. No masses. No JVD. Lungs are clear. Cardiac exam shows a regular rate and rhythm. Abdomen is soft. Extremities are without edema. Gait and ROM are intact. No gross neurologic deficits noted. Right wrist looks ok.   Wt Readings from Last 3 Encounters:  08/03/14 167 lb 3.2 oz (75.841 kg)  07/25/14 168 lb 10.4 oz (76.5 kg)  06/04/13 163 lb 5.8 oz (74.1 kg)    LABORATORY DATA/PROCEDURES:  Lab Results  Component Value Date   WBC 8.3 07/27/2014   HGB 11.2* 07/27/2014   HCT 34.9* 07/27/2014   PLT 231 07/27/2014   GLUCOSE 114* 07/27/2014   CHOL 241* 07/25/2014   TRIG 195* 07/25/2014   HDL 33* 07/25/2014   LDLCALC 169* 07/25/2014   ALT 10 06/03/2013   AST 17 06/03/2013   NA 140 07/27/2014   K 3.9 07/27/2014   CL 110 07/27/2014   CREATININE 0.88 07/27/2014   BUN 15 07/27/2014   CO2 23 07/27/2014   TSH 3.363 07/24/2014   INR 1.05 07/25/2014   HGBA1C 7.2* 07/24/2014   MICROALBUR 0.62 10/20/2008    BNP (last 3 results) No results for input(s): PROBNP in the last 8760 hours.  Lab Results  Component Value Date   TROPONINI 1.12* 07/25/2014    Dg Chest Port 1 View  07/24/2014   CLINICAL DATA:  Chest pain and shortness of breath.  EXAM: PORTABLE CHEST - 1 VIEW  COMPARISON:  06/03/2013  FINDINGS: Geographic  area of increased density at the right lung base appears to represent multiple calcified areas. This was not present by prior chest x-ray and it is unclear whether this is within the lung or potentially lies outside of the lung. This would be an unusual appearance for pneumonia. A PA and lateral chest x-ray would initially be helpful in further localizing this density.  There is no evidence of pulmonary edema, pneumothorax or pleural fluid. The heart size and mediastinal contours are normal.  IMPRESSION: Cluster of calcific appearing densities projecting over the right lung  base. It is unclear whether this lies in the lung or potentially elsewhere in the body. Initially, a PA and lateral chest x-ray would be helpful in further localizing this abnormality.   Electronically Signed   By: Aletta Edouard M.D.   On: 07/24/2014 12:28    Echo Study Conclusions from January 2016  - Left ventricle: The cavity size was normal. Wall thickness was normal. Systolic function was normal. The estimated ejection fraction was in the range of 60% to 65%. Wall motion was normal; there were no regional wall motion abnormalities. Doppler parameters are consistent with abnormal left ventricular relaxation (grade 1 diastolic dysfunction). The E/e&' ratio is between 8-15, suggesting indeterminate LV filling pressure. - Aortic valve: Trileaflet. Sclerosis without stenosis. There was no regurgitation. - Left atrium: The atrium was normal in size. - Tricuspid valve: There was mild regurgitation. - Pulmonary arteries: PA peak pressure: 40 mm Hg (S). - Inferior vena cava: The vessel was dilated. The respirophasic diameter changes were blunted (< 50%), consistent with elevated central venous pressure.  Impressions:  - Compared to the prior echo in 2011, there appear to be few changes. There is now mild pulmonary hypertension, diastolic dysfunction and indeterminate (but likely elevated) LV  filling pressure.   CARDIAC CATHETERIZATION     History obtained from chart review. Ms Sherlin is an 81 year old widowed [place] female with a history of hypertension, diabetes and hyperlipidemia who presented on 07/24/13 with chest pain, EKG changes and positive enzymes. She was placed on IV heparin and nitroglycerin. She had no further symptoms. She presents now for cardiac catheterization to define her anatomy.   PROCEDURE DESCRIPTION:   The patient was brought to the second floor [facility] cath lab in the postabsorptive state. She was premedicated with Valium 5 mg by mouth, IV Versed and fentanyl. Her right wrist was prepped and shaved in usual sterile fashion. Xylocaine 1% was used for local anesthesia. A 6 French sheath was inserted into the right radial artery using standard Seldinger technique. The patient received 4000 units of heparin intravenously. A 5 [place] TIG catheter and pigtail catheters were used for selective coronary angiography and left ventriculography respectively. Visipaque dye was used for the entirety of the case (170 mL total to patient). Retrograde aortic, left ventricular and pullback pressures were recorded.    HEMODYNAMICS:   AO SYSTOLIC/AO DIASTOLIC: 350/09  LV SYSTOLIC/LV DIASTOLIC: 381/82  ANGIOGRAPHIC RESULTS:   1. Left main; normal  2. LAD; minor irregularities 3. Left circumflex; this is a large vessel though nondominant. It was the "culprit vessel). There was a 95% fairly focal proximal to mid AV groove stenosis. There was a 90% PDA branch stenosis and a relatively small vessel..  4. Right coronary artery; codominant and free of significant disease 5. Left ventriculography; RAO left ventriculogram was performed using  25 mL of Visipaque dye at 12 mL/second. The overall LVEF estimated  60 % Without wall motion abnormalities  IMPRESSION:high-grade proximal to mid AV groove stenosis probably representing "the culprit lesion  with normal LV function. We will proceed with PCI and stenting using Angiomax, and Brilenta.  Procedure description: Using a 6 [place] XB 3 guide catheter along with a 0.14/190 cm long pro-water guidewire and a 2 mm x 12 mm long balloon predilatation was performed. The patient received 180 mg of Brilenta by mouth. She was already on aspirin. She received into max bolus with an ACT of 497. After predilatation stenting was performed with a 4 mm x 18 mm  long Xience drug-eluting stent deployed at 12 atm and postdilated with a 4 mm x 15 mm nominal long noncompliant balloon at 18 atm (4.2 mm) resulting reduction of a 95% focal proximal to mid AV groove stenosis to 0% residual. The patient tolerated the procedure well. She has residual 90% stenosis in the small PDA probably less than 2 mm in diameter which will be treated medically. The guidewire and sheath were removed. A TR band was placed on the right wrist to achieve patent hemostasis. The patient left the lab in stable condition.  Final impression: Successful PCI and stenting of high-grade proximal to mid AV groove circumflex stenosis to 0% residual with a drug eluting stent, Angiomax and Brilenta. The patient will most likely be discharged home tomorrow. She will need a minimum of 1 year of uninterrupted dual antiplatelet therapy.  Lorretta Harp MD, [facility] 07/26/2014 2:26 PM    Assessment / Plan: 1. NSTEMI with PCI/DES to LCX - doing ok clinically. On statin, beta blocker, ARB and DAPT with Brilinta/aspirin. Needs recheck of baseline labs today. See back in 4 weeks. Pretty sedentary as a general rule - activity encouraged.   2. Residual disease in the left PDA - too small for intervention - to manage medically -  No symptoms noted.   3. HTN -  BP looks great.   4. HLD -  On statin therapy -  Will need recheck lipids on return with LFTs.    5. Type 2 DM - followed by PCP  6. Abn CXR - will send for 2 view film today.   Doing ok clinically.  Family pleased with her progress at this time. No changes with her current regimen for now. See back in 4 weeks.   Patient is agreeable to this plan and will call if any problems develop in the interim.   Burtis Junes, RN, [place] [address] [place], [place]  [phone]

## 2014-09-08 ENCOUNTER — Ambulatory Visit (INDEPENDENT_AMBULATORY_CARE_PROVIDER_SITE_OTHER): Payer: Medicare Other | Admitting: Cardiology

## 2014-09-08 ENCOUNTER — Encounter: Payer: Self-pay | Admitting: Cardiology

## 2014-09-08 VITALS — BP 208/98 | HR 60 | Ht 66.0 in | Wt 168.0 lb

## 2014-09-08 DIAGNOSIS — Z79899 Other long term (current) drug therapy: Secondary | ICD-10-CM

## 2014-09-08 LAB — HEPATIC FUNCTION PANEL
ALK PHOS: 59 U/L (ref 39–117)
ALT: 13 U/L (ref 0–35)
AST: 16 U/L (ref 0–37)
Albumin: 3.7 g/dL (ref 3.5–5.2)
BILIRUBIN DIRECT: 0.1 mg/dL (ref 0.0–0.3)
BILIRUBIN INDIRECT: 0.5 mg/dL (ref 0.2–1.2)
BILIRUBIN TOTAL: 0.6 mg/dL (ref 0.2–1.2)
TOTAL PROTEIN: 6.5 g/dL (ref 6.0–8.3)

## 2014-09-08 NOTE — Progress Notes (Signed)
HPI  The patient presents for follow-up of non-Q-wave myocardial infarction. She was hospitalized on the second of this year with chest pain. She was found to have obstructive disease in the circumflex vessel requiring DES. Her EF was 60%.   Since discharge she has many vague complaints. She doesn't seem to be having the same chest discomfort that she had on admission. She has some intermittent discomfort that comes and goes and might be related to anxiety. Her biggest issue is she lost her appetite. She does have some diarrhea and some constipation. She's not having any overt chest pressure, neck or arm discomfort. She's not having any palpitations, presyncope or syncope. She has had no weight loss.    Allergies  Allergen Reactions  . Clindamycin/Lincomycin Swelling    Tongue swelling, angioedema  . Peanut Butter Flavor Shortness Of Breath    Swelling and airway closes  . Peanuts [Peanut Oil] Shortness Of Breath    Swelling and airway closes    Current Outpatient Prescriptions  Medication Sig Dispense Refill  . aspirin EC 81 MG tablet Take 1 tablet (81 mg total) by mouth once.    Marland Kitchen atorvastatin (LIPITOR) 80 MG tablet Take 1 tablet (80 mg total) by mouth daily at 6 PM. 30 tablet 6  . cholecalciferol (VITAMIN D) 1000 UNITS tablet Take 1,000 Units by mouth daily.    Marland Kitchen EPINEPHrine (EPI-PEN) 0.3 mg/0.3 mL SOAJ injection Inject 0.3 mLs (0.3 mg total) into the muscle as needed (for resp distress from angioedema). 1 Device 3  . esomeprazole (NEXIUM) 40 MG capsule Take 40 mg by mouth daily before breakfast.    . levothyroxine (SYNTHROID, LEVOTHROID) 50 MCG tablet Take 50 mcg by mouth daily before breakfast.    . LORazepam (ATIVAN) 1 MG tablet Take 1 mg by mouth at bedtime.     Marland Kitchen losartan (COZAAR) 25 MG tablet Take 1 tablet (25 mg total) by mouth daily. 30 tablet 6  . metFORMIN (GLUCOPHAGE) 500 MG tablet Take 1 tablet (500 mg total) by mouth 2 (two) times daily with a meal.    . metoprolol  tartrate (LOPRESSOR) 25 MG tablet Take 0.5 tablets (12.5 mg total) by mouth 2 (two) times daily. 30 tablet 6  . nitroGLYCERIN (NITROSTAT) 0.4 MG SL tablet Place 1 tablet (0.4 mg total) under the tongue every 5 (five) minutes x 3 doses as needed for chest pain. 25 tablet 3  . ticagrelor (BRILINTA) 90 MG TABS tablet Take 1 tablet (90 mg total) by mouth 2 (two) times daily. 60 tablet 6   No current facility-administered medications for this visit.    Past Medical History  Diagnosis Date  . Diabetes mellitus without complication   . Hypertension   . Anxiety   . Hypothyroidism   . Angioedema     a. 05/2014 - felt to be 2/2 clindamycin therapy.  . Hyperlipemia   . CAD (coronary artery disease)     a. 07/2014 NSTEMI/PCI: LM nl, LAD min irregs, LCX 74m (4.0x18 Xience DES), LPDA 90 (2mm vessel->med Rx), RCA nl, EF 60%.  . Abnormal chest x-ray     Past Surgical History  Procedure Laterality Date  . Intubation nasotracheal N/A 04/03/2013    Procedure: FIBEROPTIC NASAL-TRACHEAL INTUBATION;  Surgeon: Christia Reading, MD;  Location: [facility];  Service: ENT;  Laterality: N/A;  . Left heart catheterization with coronary angiogram N/A 07/26/2014    Procedure: LEFT HEART CATHETERIZATION WITH CORONARY ANGIOGRAM;  Surgeon: Runell Gess, MD;  Location: [facility]  CATH LAB;  Service: Cardiovascular;  Laterality: N/A;    ROS:  As stated in the HPI and negative for all other systems.  PHYSICAL EXAM BP 208/98 mmHg  Pulse 60  Ht 5\' 6"  (1.676 m)  Wt 168 lb (76.204 kg)  BMI 27.13 kg/m2 GENERAL:  Well appearing HEENT:  Pupils equal round and reactive, fundi not visualized, oral mucosa unremarkable NECK:  No jugular venous distention, waveform within normal limits, carotid upstroke brisk and symmetric, no bruits, no thyromegaly LYMPHATICS:  No cervical, inguinal adenopathy LUNGS:  Clear to auscultation bilaterally BACK:  No CVA tenderness CHEST:  Unremarkable HEART:  PMI not displaced or sustained,S1 and S2 within  normal limits, no S3, no S4, no clicks, no rubs,  murmurs ABD:  Flat, positive bowel sounds normal in frequency in pitch, no bruits, no rebound, no guarding, no midline pulsatile mass, no hepatomegaly, no splenomegaly EXT:  2 plus pulses throughout, no edema, no cyanosis no clubbing SKIN:  No rashes no nodules NEURO:  Cranial nerves II through XII grossly intact, motor grossly intact throughout PSYCH:  Cognitively intact, oriented to person place and time    ASSESSMENT AND PLAN   NSTEMI:  She will continue with risk reduction.  HTN:   Her blood pressure is elevated. However, her family says that this is quite unusual. They take her blood pressure at home and states in the 130s. I'm not going to change her medicines today but they're going to keep a blood pressure diary and bring the blood pressure machine with him to make sure it is accurate. He will have follow-up in 2 weeks.  HLD:   Given her loss of appetite I will check a lipid profile. I will also have her stop the Lipitor for about 2 weeks to see if this improves her symptoms.  DM:  I will defer to her primary care physician.  ABNORMAL CXR:   She had this repeated. There were no abnormalities on the repeat chest x-ray. No follow-up is necessary.

## 2014-09-08 NOTE — Patient Instructions (Signed)
Your physician recommends that you schedule a follow-up appointment in: 2 weeks with Shawn RouteLori Gerhart PA  Hold Lipitor for 2 weeks  Keep a Blood Pressure diary for 10 days and bring it in . Take your blood pressure two times a day  We are ordering lab work for you to get done

## 2014-12-22 ENCOUNTER — Encounter (HOSPITAL_COMMUNITY): Payer: Self-pay | Admitting: Emergency Medicine

## 2014-12-22 ENCOUNTER — Emergency Department (HOSPITAL_COMMUNITY): Payer: Medicare Other

## 2014-12-22 ENCOUNTER — Emergency Department (HOSPITAL_COMMUNITY)
Admission: EM | Admit: 2014-12-22 | Discharge: 2014-12-22 | Disposition: A | Discharge status: Home / Self Care | Payer: Medicare Other | Attending: Emergency Medicine | Admitting: Emergency Medicine

## 2014-12-22 DIAGNOSIS — E039 Hypothyroidism, unspecified: Secondary | ICD-10-CM | POA: Insufficient documentation

## 2014-12-22 DIAGNOSIS — I10 Essential (primary) hypertension: Secondary | ICD-10-CM | POA: Insufficient documentation

## 2014-12-22 DIAGNOSIS — Z79899 Other long term (current) drug therapy: Secondary | ICD-10-CM | POA: Diagnosis not present

## 2014-12-22 DIAGNOSIS — I2510 Atherosclerotic heart disease of native coronary artery without angina pectoris: Secondary | ICD-10-CM | POA: Diagnosis not present

## 2014-12-22 DIAGNOSIS — Z7982 Long term (current) use of aspirin: Secondary | ICD-10-CM | POA: Insufficient documentation

## 2014-12-22 DIAGNOSIS — R0789 Other chest pain: Secondary | ICD-10-CM | POA: Diagnosis not present

## 2014-12-22 DIAGNOSIS — Z9889 Other specified postprocedural states: Secondary | ICD-10-CM | POA: Insufficient documentation

## 2014-12-22 DIAGNOSIS — E785 Hyperlipidemia, unspecified: Secondary | ICD-10-CM | POA: Diagnosis not present

## 2014-12-22 DIAGNOSIS — F419 Anxiety disorder, unspecified: Secondary | ICD-10-CM | POA: Insufficient documentation

## 2014-12-22 DIAGNOSIS — E119 Type 2 diabetes mellitus without complications: Secondary | ICD-10-CM | POA: Insufficient documentation

## 2014-12-22 DIAGNOSIS — R079 Chest pain, unspecified: Secondary | ICD-10-CM | POA: Diagnosis present

## 2014-12-22 LAB — COMPREHENSIVE METABOLIC PANEL
ALK PHOS: 66 U/L (ref 38–126)
ALT: 23 U/L (ref 14–54)
AST: 24 U/L (ref 15–41)
Albumin: 3.3 g/dL — ABNORMAL LOW (ref 3.5–5.0)
Anion gap: 8 (ref 5–15)
BILIRUBIN TOTAL: 0.4 mg/dL (ref 0.3–1.2)
BUN: 21 mg/dL — AB (ref 6–20)
CALCIUM: 9.5 mg/dL (ref 8.9–10.3)
CO2: 23 mmol/L (ref 22–32)
CREATININE: 0.84 mg/dL (ref 0.44–1.00)
Chloride: 108 mmol/L (ref 101–111)
GFR calc Af Amer: >60 mL/min (ref >60)
GFR calc non Af Amer: >60 mL/min (ref >60)
Glucose, Bld: 198 mg/dL — ABNORMAL HIGH (ref 65–99)
Potassium: 4.3 mmol/L (ref 3.5–5.1)
Sodium: 139 mmol/L (ref 135–145)
Total Protein: 6.7 g/dL (ref 6.5–8.1)

## 2014-12-22 LAB — CBC
HCT: 37.1 % (ref 36.0–46.0)
HEMOGLOBIN: 11.9 g/dL — AB (ref 12.0–15.0)
MCH: 27.9 pg (ref 26.0–34.0)
MCHC: 32.1 g/dL (ref 30.0–36.0)
MCV: 87.1 fL (ref 78.0–100.0)
PLATELETS: 284 10*3/uL (ref 150–400)
RBC: 4.26 MIL/uL (ref 3.87–5.11)
RDW: 13.3 % (ref 11.5–15.5)
WBC: 11.3 10*3/uL — ABNORMAL HIGH (ref 4.0–10.5)

## 2014-12-22 LAB — MAGNESIUM: Magnesium: 1.4 mg/dL — ABNORMAL LOW (ref 1.7–2.4)

## 2014-12-22 LAB — PROTIME-INR
INR: 0.97 (ref 0.00–1.49)
Prothrombin Time: 13.1 seconds (ref 11.6–15.2)

## 2014-12-22 LAB — TROPONIN I
Troponin I: <0.03 ng/mL (ref <0.031)
Troponin I: <0.03 ng/mL (ref <0.031)

## 2014-12-22 MED ORDER — ASPIRIN 81 MG PO CHEW: 324.0000 mg | CHEWABLE_TABLET | Freq: Once | ORAL | Status: DC

## 2014-12-22 NOTE — ED Notes (Signed)
Phlebotomy at bedside.

## 2014-12-22 NOTE — Discharge Instructions (Signed)
As discussed, your evaluation today has been largely reassuring.  But, it is important that you monitor your condition carefully, and do not hesitate to return to the ED if you develop new, or concerning changes in your condition. ° °Otherwise, please follow-up with your physician for appropriate ongoing care. ° °Chest Pain (Nonspecific) °It is often hard to give a diagnosis for the cause of chest pain. There is always a chance that your pain could be related to something serious, such as a heart attack or a blood clot in the lungs. You need to follow up with your doctor. °HOME CARE °· If antibiotic medicine was given, take it as directed by your doctor. Finish the medicine even if you start to feel better. °· For the next few days, avoid activities that bring on chest pain. Continue physical activities as told by your doctor. °· Do not use any tobacco products. This includes cigarettes, chewing tobacco, and e-cigarettes. °· Avoid drinking alcohol. °· Only take medicine as told by your doctor. °· Follow your doctor's suggestions for more testing if your chest pain does not go away. °· Keep all doctor visits you made. °GET HELP IF: °· Your chest pain does not go away, even after treatment. °· You have a rash with blisters on your chest. °· You have a fever. °GET HELP RIGHT AWAY IF:  °· You have more pain or pain that spreads to your arm, neck, jaw, back, or belly (abdomen). °· You have shortness of breath. °· You cough more than usual or cough up blood. °· You have very bad back or belly pain. °· You feel sick to your stomach (nauseous) or throw up (vomit). °· You have very bad weakness. °· You pass out (faint). °· You have chills. °This is an emergency. Do not wait to see if the problems will go away. Call your local emergency services (911 in [place] Do not drive yourself to the hospital. °MAKE SURE YOU:  °· Understand these instructions. °· Will watch your condition. °· Will get help right away if you are not doing  well or get worse. °Document Released: 12/26/2007 Document Revised: 07/14/2013 Document Reviewed: 12/26/2007 °Exit[employer]® Patient Information ©2015 [employer]. This information is not intended to replace advice given to you by your health care provider. Make sure you discuss any questions you have with your health care provider. ° °

## 2014-12-22 NOTE — ED Notes (Signed)
Patient alert and oriented at discharge.  Patient taken to the waiting room via wheelchair with this RN.  Unable to obtain a signature from family due to signature pad not working.

## 2014-12-22 NOTE — ED Provider Notes (Signed)
CSN: [id number]     Arrival date & time 12/22/14  0906 History   First MD Initiated Contact with Patient 12/22/14 [phone]     Chief Complaint  Patient presents with  . Chest Pain     (Consider location/radiation/quality/duration/timing/severity/associated sxs/prior Treatment) HPI Patient presents with concern of chest tightness. Symptoms began within the past 4 hours. History is provided by the patient, assisted by grand daughter, translator. No clear precipitant, and since onset symptoms have improved after the patient received nitroglycerin. There is no associated dyspnea, belly pain, vomiting, fever. Patient was well prior to the onset of symptoms. No recent medication changes. She does acknowledge occasional loose stool, no black stool. Patient has history of MI, 5 months ago, and is taking her blood thinning medication appropriately.   Past Medical History  Diagnosis Date  . Diabetes mellitus without complication   . Hypertension   . Anxiety   . Hypothyroidism   . Angioedema     a. 05/2014 - felt to be 2/2 clindamycin therapy.  . Hyperlipemia   . CAD (coronary artery disease)     a. 07/2014 NSTEMI/PCI: LM nl, LAD min irregs, LCX 59m (4.0x18 Xience DES), LPDA 90 (2mm vessel->med Rx), RCA nl, EF 60%.  . Abnormal chest x-ray    Past Surgical History  Procedure Laterality Date  . Intubation nasotracheal N/A 04/03/2013    Procedure: FIBEROPTIC NASAL-TRACHEAL INTUBATION;  Surgeon: Christia Reading, MD;  Location: [facility];  Service: ENT;  Laterality: N/A;  . Left heart catheterization with coronary angiogram N/A 07/26/2014    Procedure: LEFT HEART CATHETERIZATION WITH CORONARY ANGIOGRAM;  Surgeon: Runell Gess, MD;  Location: [facility] CATH LAB;  Service: Cardiovascular;  Laterality: N/A;   Family History  Problem Relation Age of Onset  . Hypertension Father   . Diabetes Father    History  Substance Use Topics  . Smoking status: Never Smoker   . Smokeless tobacco: Never Used  .  Alcohol Use: No   OB History    No data available     Review of Systems  Constitutional:       Per HPI, otherwise negative  HENT:       Per HPI, otherwise negative  Respiratory:       Per HPI, otherwise negative  Cardiovascular:       Per HPI, otherwise negative  Gastrointestinal: Negative for vomiting.  Endocrine:       Negative aside from HPI  Genitourinary:       Neg aside from HPI   Musculoskeletal:       Per HPI, otherwise negative  Skin: Positive for pallor.  Neurological: Negative for syncope.      Allergies  Clindamycin/lincomycin; Peanut butter flavor; and Peanuts  Home Medications   Prior to Admission medications   Medication Sig Start Date End Date Taking? Authorizing Provider  aspirin EC 81 MG tablet Take 1 tablet (81 mg total) by mouth once. 07/27/14   Ok Anis, NP  atorvastatin (LIPITOR) 80 MG tablet Take 1 tablet (80 mg total) by mouth daily at 6 PM. 07/27/14   Ok Anis, NP  cholecalciferol (VITAMIN D) 1000 UNITS tablet Take 1,000 Units by mouth daily.    Historical Provider, MD  EPINEPHrine (EPI-PEN) 0.3 mg/0.3 mL SOAJ injection Inject 0.3 mLs (0.3 mg total) into the muscle as needed (for resp distress from angioedema). 06/05/13   Simonne Martinet, NP  esomeprazole (NEXIUM) 40 MG capsule Take 40 mg by mouth daily before breakfast.  Historical Provider, MD  levothyroxine (SYNTHROID, LEVOTHROID) 50 MCG tablet Take 50 mcg by mouth daily before breakfast.    Historical Provider, MD  LORazepam (ATIVAN) 1 MG tablet Take 1 mg by mouth at bedtime.     Historical Provider, MD  losartan (COZAAR) 25 MG tablet Take 1 tablet (25 mg total) by mouth daily. 07/27/14   Ok Anis, NP  metFORMIN (GLUCOPHAGE) 500 MG tablet Take 1 tablet (500 mg total) by mouth 2 (two) times daily with a meal. 07/27/14   Ok Anis, NP  metoprolol tartrate (LOPRESSOR) 25 MG tablet Take 0.5 tablets (12.5 mg total) by mouth 2 (two) times daily. 07/27/14    Ok Anis, NP  nitroGLYCERIN (NITROSTAT) 0.4 MG SL tablet Place 1 tablet (0.4 mg total) under the tongue every 5 (five) minutes x 3 doses as needed for chest pain. 07/27/14   Ok Anis, NP  ticagrelor (BRILINTA) 90 MG TABS tablet Take 1 tablet (90 mg total) by mouth 2 (two) times daily. 07/27/14   Ok Anis, NP   BP 209/72 mmHg  Pulse 57  Temp(Src) 98.4 F (36.9 C) (Oral)  Resp 19  SpO2 99% Physical Exam  Constitutional: She is oriented to person, place, and time. She appears well-developed and well-nourished. No distress.  HENT:  Head: Normocephalic and atraumatic.  Eyes: Conjunctivae and EOM are normal.  Cardiovascular: Normal rate and regular rhythm.   Pulmonary/Chest: Effort normal and breath sounds normal. No stridor. No respiratory distress.  Abdominal: She exhibits no distension. There is no tenderness.  Musculoskeletal: She exhibits no edema.  Neurological: She is alert and oriented to person, place, and time. No cranial nerve deficit.  Skin: Skin is warm and dry. There is pallor.  Psychiatric: She has a normal mood and affect.  Nursing note and vitals reviewed.   ED Course  Procedures (including critical care time)   Chart review demonstrates an ST EMI January 2016. Results as below from catheterization, notable for both placement of stent and identification of Lee lesions, not amenable to stenting at that time.  ANGIOGRAPHIC RESULTS:   1. Left main; normal   2. LAD; minor irregularities 3. Left circumflex; this is a large vessel though nondominant. It was the "culprit vessel). There was a 95% fairly focal proximal to mid AV groove stenosis. There was a 90% PDA branch stenosis and a relatively small vessel..   4. Right coronary artery; codominant and free of significant disease 5. Left ventriculography; RAO left ventriculogram was performed using   25 mL of Visipaque dye at 12 mL/second. The overall LVEF estimated   60 %  Without wall motion  abnormalities   Labs Review Labs Reviewed  CBC - Abnormal; Notable for the following:    WBC 11.3 (*)    Hemoglobin 11.9 (*)    All other components within normal limits  COMPREHENSIVE METABOLIC PANEL - Abnormal; Notable for the following:    Glucose, Bld 198 (*)    BUN 21 (*)    Albumin 3.3 (*)    All other components within normal limits  MAGNESIUM - Abnormal; Notable for the following:    Magnesium 1.4 (*)    All other components within normal limits  PROTIME-INR  TROPONIN I  TROPONIN I    Imaging Review Dg Chest 2 View  12/22/2014   CLINICAL DATA:  81 year old female with chest pain, diaphoresis and back pain  EXAM: CHEST  2 VIEW  COMPARISON:  Prior chest x-ray 08/03/2014  FINDINGS:  Cardiac and mediastinal contours remain within normal limits. Atherosclerotic calcification present within the transverse aorta. No evidence of a pulmonary edema, pleural effusion, pneumothorax or focal airspace consolidation. Stable pulmonary hyperinflation with mild central bronchitic change. Multilevel degenerative endplate spurring. No acute osseous abnormality.  IMPRESSION: Stable chest x-ray without evidence of acute cardiopulmonary process.   Electronically Signed   By: Heath  McCullough M.D.   On: 06/01/201Malachy Moan6 10:02     EKG Interpretation   Date/Time:  Wednesday December 22 2014 09:10:50 EDT Ventricular Rate:  58 PR Interval:  150 QRS Duration: 82 QT Interval:  388 QTC Calculation: 381 R Axis:   -6 Text Interpretation:  Sinus rhythm Borderline T abnormalities, diffuse  leads Sinus rhythm T wave abnormality Abnormal ekg Confirmed by Gerhard MunchLOCKWOOD,  Maleka Contino  MD [phone]([id number]) on 12/22/2014 9:28:12 AM     Chart review demonstrates patient had MI earlier this year.  12:56 PM I reviewed the results (including imaging as performed), agree with the interpretation  On repeat exam the patient appears better.  We reviewed all findings.  She will f/u  w PMD, cards  MDM  Elderly female presents several  episodes of chest pain. Here the patient is awake, alert, essentially asymptomatic, and for several hours of monitoring in the emergency department has no evidence for decompensation, arrhythmia. Patient has serial negative troponin, nonischemic EKG, unremarkable chest x-ray, and improved here. Patient has previously identified atherosclerosis, recently identified as not amenable to stenting. With today's largely reassuring findings, the patient's endorsement of taking medications appropriately, including Brilinta for prophylaxis, patient was discharged in stable condition to follow-up with primary care and cardiology.  Gerhard Munchobert Yared Barefoot, MD 12/22/14 1259

## 2014-12-22 NOTE — ED Notes (Signed)
EMS - Patient coming from home with chest pain that started today with associated diaphoresis and back pain.  Hx of January '16 x 1 stent.  Sinus on monitor.  Described as a pressure 4/10 prior to 1 nitro, now pain is 0/10.  Given 324 mg Aspirin.

## 2015-04-27 ENCOUNTER — Encounter (HOSPITAL_COMMUNITY): Payer: Self-pay

## 2015-04-27 ENCOUNTER — Observation Stay (HOSPITAL_COMMUNITY)
Admission: EM | Admit: 2015-04-27 | Discharge: 2015-04-28 | Disposition: A | Discharge status: Home / Self Care | Payer: Medicare Other | Attending: Internal Medicine | Admitting: Internal Medicine

## 2015-04-27 ENCOUNTER — Emergency Department (HOSPITAL_COMMUNITY): Payer: Medicare Other

## 2015-04-27 DIAGNOSIS — Z9861 Coronary angioplasty status: Secondary | ICD-10-CM

## 2015-04-27 DIAGNOSIS — E039 Hypothyroidism, unspecified: Secondary | ICD-10-CM | POA: Insufficient documentation

## 2015-04-27 DIAGNOSIS — I10 Essential (primary) hypertension: Secondary | ICD-10-CM | POA: Insufficient documentation

## 2015-04-27 DIAGNOSIS — Z9889 Other specified postprocedural states: Secondary | ICD-10-CM | POA: Diagnosis not present

## 2015-04-27 DIAGNOSIS — E119 Type 2 diabetes mellitus without complications: Secondary | ICD-10-CM | POA: Diagnosis not present

## 2015-04-27 DIAGNOSIS — Z23 Encounter for immunization: Secondary | ICD-10-CM | POA: Insufficient documentation

## 2015-04-27 DIAGNOSIS — R079 Chest pain, unspecified: Secondary | ICD-10-CM | POA: Diagnosis not present

## 2015-04-27 DIAGNOSIS — Z79899 Other long term (current) drug therapy: Secondary | ICD-10-CM | POA: Diagnosis not present

## 2015-04-27 DIAGNOSIS — F411 Generalized anxiety disorder: Secondary | ICD-10-CM | POA: Diagnosis present

## 2015-04-27 DIAGNOSIS — E785 Hyperlipidemia, unspecified: Secondary | ICD-10-CM | POA: Diagnosis not present

## 2015-04-27 DIAGNOSIS — R0789 Other chest pain: Secondary | ICD-10-CM | POA: Diagnosis present

## 2015-04-27 DIAGNOSIS — I2510 Atherosclerotic heart disease of native coronary artery without angina pectoris: Secondary | ICD-10-CM

## 2015-04-27 DIAGNOSIS — Z7982 Long term (current) use of aspirin: Secondary | ICD-10-CM | POA: Diagnosis not present

## 2015-04-27 DIAGNOSIS — F419 Anxiety disorder, unspecified: Secondary | ICD-10-CM | POA: Diagnosis not present

## 2015-04-27 DIAGNOSIS — R0602 Shortness of breath: Secondary | ICD-10-CM | POA: Insufficient documentation

## 2015-04-27 DIAGNOSIS — I252 Old myocardial infarction: Secondary | ICD-10-CM

## 2015-04-27 LAB — BASIC METABOLIC PANEL
Anion gap: 9 (ref 5–15)
BUN: 20 mg/dL (ref 6–20)
CHLORIDE: 107 mmol/L (ref 101–111)
CO2: 22 mmol/L (ref 22–32)
CREATININE: 0.81 mg/dL (ref 0.44–1.00)
Calcium: 9.1 mg/dL (ref 8.9–10.3)
GFR calc Af Amer: >60 mL/min (ref >60)
GFR calc non Af Amer: >60 mL/min (ref >60)
Glucose, Bld: 196 mg/dL — ABNORMAL HIGH (ref 65–99)
Potassium: 3.9 mmol/L (ref 3.5–5.1)
SODIUM: 138 mmol/L (ref 135–145)

## 2015-04-27 LAB — CBC
HCT: 38.1 % (ref 36.0–46.0)
Hemoglobin: 12.3 g/dL (ref 12.0–15.0)
MCH: 28.5 pg (ref 26.0–34.0)
MCHC: 32.3 g/dL (ref 30.0–36.0)
MCV: 88.2 fL (ref 78.0–100.0)
PLATELETS: 234 10*3/uL (ref 150–400)
RBC: 4.32 MIL/uL (ref 3.87–5.11)
RDW: 13.3 % (ref 11.5–15.5)
WBC: 7.6 10*3/uL (ref 4.0–10.5)

## 2015-04-27 LAB — GLUCOSE, CAPILLARY
GLUCOSE-CAPILLARY: 146 mg/dL — AB (ref 65–99)
GLUCOSE-CAPILLARY: 288 mg/dL — AB (ref 65–99)

## 2015-04-27 LAB — TROPONIN I: Troponin I: <0.03 ng/mL (ref <0.031)

## 2015-04-27 LAB — I-STAT TROPONIN, ED
Troponin i, poc: 0.01 ng/mL (ref 0.00–0.08)
Troponin i, poc: 0 ng/mL (ref 0.00–0.08)

## 2015-04-27 LAB — TSH: TSH: 0.634 u[IU]/mL (ref 0.350–4.500)

## 2015-04-27 LAB — BRAIN NATRIURETIC PEPTIDE: B Natriuretic Peptide: 195.9 pg/mL — ABNORMAL HIGH (ref 0.0–100.0)

## 2015-04-27 LAB — D-DIMER, QUANTITATIVE: D-Dimer, Quant: 0.77 ug/mL-FEU — ABNORMAL HIGH (ref 0.00–0.48)

## 2015-04-27 MED ORDER — ATORVASTATIN CALCIUM 80 MG PO TABS
80.0000 mg | ORAL_TABLET | Freq: Every day | ORAL | Status: DC
  Administered 2015-04-27: 80 mg via ORAL
  Filled 2015-04-27: qty 1

## 2015-04-27 MED ORDER — ASPIRIN EC 81 MG PO TBEC
81.0000 mg | DELAYED_RELEASE_TABLET | Freq: Every day | ORAL | Status: DC
  Administered 2015-04-27 – 2015-04-28 (×2): 81 mg via ORAL
  Filled 2015-04-27 (×2): qty 1

## 2015-04-27 MED ORDER — PNEUMOCOCCAL VAC POLYVALENT 25 MCG/0.5ML IJ INJ
0.5000 mL | INJECTION | INTRAMUSCULAR | Status: AC
  Administered 2015-04-28: 0.5 mL via INTRAMUSCULAR
  Filled 2015-04-27: qty 0.5

## 2015-04-27 MED ORDER — PANTOPRAZOLE SODIUM 40 MG PO TBEC
40.0000 mg | DELAYED_RELEASE_TABLET | Freq: Every day | ORAL | Status: DC
  Administered 2015-04-27 – 2015-04-28 (×2): 40 mg via ORAL
  Filled 2015-04-27 (×3): qty 1

## 2015-04-27 MED ORDER — TICAGRELOR 90 MG PO TABS
90.0000 mg | ORAL_TABLET | Freq: Two times a day (BID) | ORAL | Status: DC
Start: 2015-04-27 — End: 2015-04-28
  Administered 2015-04-27 – 2015-04-28 (×3): 90 mg via ORAL
  Filled 2015-04-27 (×4): qty 1

## 2015-04-27 MED ORDER — ASPIRIN 81 MG PO CHEW
324.0000 mg | CHEWABLE_TABLET | Freq: Once | ORAL | Status: AC
  Administered 2015-04-27: 324 mg via ORAL
  Filled 2015-04-27: qty 4

## 2015-04-27 MED ORDER — LEVOTHYROXINE SODIUM 50 MCG PO TABS
50.0000 ug | ORAL_TABLET | Freq: Every day | ORAL | Status: DC
  Filled 2015-04-27: qty 1

## 2015-04-27 MED ORDER — MORPHINE SULFATE (PF) 2 MG/ML IV SOLN: 1.0000 mg | INTRAVENOUS | Status: DC | PRN

## 2015-04-27 MED ORDER — ENOXAPARIN SODIUM 40 MG/0.4ML ~~LOC~~ SOLN
40.0000 mg | SUBCUTANEOUS | Status: DC
  Administered 2015-04-27: 40 mg via SUBCUTANEOUS
  Filled 2015-04-27: qty 0.4

## 2015-04-27 MED ORDER — NITROGLYCERIN 0.4 MG SL SUBL: 0.4000 mg | SUBLINGUAL_TABLET | SUBLINGUAL | Status: DC | PRN

## 2015-04-27 MED ORDER — METOPROLOL TARTRATE 12.5 MG HALF TABLET
12.5000 mg | ORAL_TABLET | Freq: Two times a day (BID) | ORAL | Status: DC
  Administered 2015-04-27 – 2015-04-28 (×2): 12.5 mg via ORAL
  Filled 2015-04-27 (×2): qty 1

## 2015-04-27 MED ORDER — INSULIN ASPART 100 UNIT/ML ~~LOC~~ SOLN
0.0000 [IU] | Freq: Three times a day (TID) | SUBCUTANEOUS | Status: DC
  Administered 2015-04-28: 5 [IU] via SUBCUTANEOUS
  Administered 2015-04-28: 2 [IU] via SUBCUTANEOUS

## 2015-04-27 MED ORDER — VITAMIN D 1000 UNITS PO TABS
1000.0000 [IU] | ORAL_TABLET | Freq: Every day | ORAL | Status: DC
  Administered 2015-04-27 – 2015-04-28 (×2): 1000 [IU] via ORAL
  Filled 2015-04-27 (×2): qty 1

## 2015-04-27 MED ORDER — LOSARTAN POTASSIUM 25 MG PO TABS
25.0000 mg | ORAL_TABLET | Freq: Every day | ORAL | Status: DC
  Administered 2015-04-27 – 2015-04-28 (×2): 25 mg via ORAL
  Filled 2015-04-27 (×2): qty 1

## 2015-04-27 MED ORDER — ACETAMINOPHEN 325 MG PO TABS: 650.0000 mg | ORAL_TABLET | ORAL | Status: DC | PRN

## 2015-04-27 MED ORDER — LORAZEPAM 1 MG PO TABS
1.0000 mg | ORAL_TABLET | Freq: Every day | ORAL | Status: DC
  Administered 2015-04-27: 1 mg via ORAL
  Filled 2015-04-27: qty 1

## 2015-04-27 MED ORDER — LEVOTHYROXINE SODIUM 75 MCG PO TABS
75.0000 ug | ORAL_TABLET | Freq: Every day | ORAL | Status: DC
  Administered 2015-04-28: 75 ug via ORAL
  Filled 2015-04-27: qty 1

## 2015-04-27 NOTE — ED Notes (Signed)
Spoke to MD in regards to giving metoprolol with pt HR currently at 53 bpm. EDP agrees to hold medication at this time. Will monitor the HR and give if HR stays consistently above 60bpm.

## 2015-04-27 NOTE — H&P (Signed)
Date: 04/27/2015               Patient Name:  Jo Larson MRN: [id number]  DOB: [birth year] Age / Sex: 81 y.o., female   PCP: Provider Not In System         Medical Service: Internal Medicine Teaching Service         Attending Physician: Dr. Cliffton Asters, MD    First Contact: Dr. Ladona Ridgel Pager: [phone]  Second Contact: Dr. Delane Ginger Pager: [phone]       After Hours (After 5p/  First Contact Pager: [phone]  weekends / holidays): Second Contact Pager: [phone]   Chief Complaint: CP, SOB  History of Present Illness: Jo Larson is an 81 yo [place]atian speaking female with CAD, DMII, HTN, hypothyroid, HFpEF (grade 1; EF 60-65%; 2016), and h/o NSTEMI s/p DES to LCx.  History is obtained with patient's daughter acting as interpreter.  Patient presents with 1 day history of CP and SOB.  She reports symptoms started at 630 am, while sitting down.  She reports intermittent, 2-3 second, sharp, sternal chest pains without radiation.  The pain is nonpleuritic.  It is not reproduced with palpation.  She endorses SOB during the acute shooting pains, but denies SOB between episodes.  The symptoms had resolved by the time she arrived to the ED without intervention (~7:30 am).  She currently denies chest pain, palpitations, or SOB.  She had an NSTEMI in January 2016, with DES to LCx.  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  She reports adherence to ASA and Ticagrelor, though she missed today's dose 2/2 pain and going to ED.  She reports the pain she experienced this morning is not similar to the pain she had in January.  She denies leg pain or previous clot.  She denies a h/o GERD, though the patient is taking Protonix.  She does not know why.  She denies sternal burning or cough following eating or lying down.  She has a h/o DMII, on Glimepiride.  Last A1c was January 2016 at 7.2%.    She reports adherence to antihypertensive medications.  She denies F/C, cough, N/V, C/D, melena,  hematochezia, dysuria, dizziness, or falls.  She endorses nervousness/anxiety because she has had to come to the hospital, but otherwise has no acute complaints.  She endorses diffuse sweats (for 30 years), 20-30 lb unintentional weight loss, and decreased appetite.  She reports an EGD and colonoscopy in 2016 that were normal.  In the ED, ECG was unchanged from previous.  CXR revealed stable cardiomegaly.  Troponins were negative x2.  BNP was normal at 195.  Meds: Current Facility-Administered Medications  Medication Dose Route Frequency Provider Last Rate Last Dose  . [START ON 04/28/2015] levothyroxine (SYNTHROID, LEVOTHROID) tablet 50 mcg  50 mcg Oral QAC breakfast Ambrose Finland Little, MD      . losartan (COZAAR) tablet 25 mg  25 mg Oral Daily Laurence Spates, MD   25 mg at 04/27/15 1049  . metoprolol tartrate (LOPRESSOR) tablet 12.5 mg  12.5 mg Oral BID Laurence Spates, MD   Stopped at 04/27/15 1014  . pantoprazole (PROTONIX) EC tablet 40 mg  40 mg Oral Daily Laurence Spates, MD   40 mg at 04/27/15 1051  . ticagrelor (BRILINTA) tablet 90 mg  90 mg Oral BID Laurence Spates, MD   90 mg at 04/27/15 1049   Current Outpatient Prescriptions  Medication Sig Dispense Refill  . aspirin EC 81 MG  tablet Take 1 tablet (81 mg total) by mouth once. (Patient taking differently: Take 81 mg by mouth daily. )    . atorvastatin (LIPITOR) 80 MG tablet Take 1 tablet (80 mg total) by mouth daily at 6 PM. 30 tablet 6  . cholecalciferol (VITAMIN D) 1000 UNITS tablet Take 1,000 Units by mouth daily.    Marland Kitchen EPINEPHrine (EPI-PEN) 0.3 mg/0.3 mL SOAJ injection Inject 0.3 mLs (0.3 mg total) into the muscle as needed (for resp distress from angioedema). 1 Device 3  . esomeprazole (NEXIUM) 40 MG capsule Take 40 mg by mouth daily before breakfast.    . glimepiride (AMARYL) 4 MG tablet Take 4 mg by mouth daily.  5  . levothyroxine (SYNTHROID, LEVOTHROID) 75 MCG tablet Take 75 mcg by mouth daily.  5  .  LORazepam (ATIVAN) 1 MG tablet Take 1 mg by mouth at bedtime.     Marland Kitchen losartan (COZAAR) 25 MG tablet Take 1 tablet (25 mg total) by mouth daily. 30 tablet 6  . metFORMIN (GLUCOPHAGE) 500 MG tablet Take 1 tablet (500 mg total) by mouth 2 (two) times daily with a meal.    . metoprolol tartrate (LOPRESSOR) 25 MG tablet Take 0.5 tablets (12.5 mg total) by mouth 2 (two) times daily. 30 tablet 6  . nitroGLYCERIN (NITROSTAT) 0.4 MG SL tablet Place 1 tablet (0.4 mg total) under the tongue every 5 (five) minutes x 3 doses as needed for chest pain. 25 tablet 3  . ticagrelor (BRILINTA) 90 MG TABS tablet Take 1 tablet (90 mg total) by mouth 2 (two) times daily. 60 tablet 6    Allergies: Allergies as of 04/27/2015 - Review Complete 04/27/2015  Allergen Reaction Noted  . Clindamycin/lincomycin Swelling 04/03/2013  . Peanut butter flavor Shortness Of Breath 08/03/2014  . Peanuts [peanut oil] Shortness Of Breath 08/03/2014   Past Medical History  Diagnosis Date  . Diabetes mellitus without complication (HCC)   . Hypertension   . Anxiety   . Hypothyroidism   . Angioedema     a. 05/2014 - felt to be 2/2 clindamycin therapy.  . Hyperlipemia   . CAD (coronary artery disease)     a. 07/2014 NSTEMI/PCI: LM nl, LAD min irregs, LCX 78m (4.0x18 Xience DES), LPDA 90 (2mm vessel->med Rx), RCA nl, EF 60%.  . Abnormal chest x-ray    Past Surgical History  Procedure Laterality Date  . Intubation nasotracheal N/A 04/03/2013    Procedure: FIBEROPTIC NASAL-TRACHEAL INTUBATION;  Surgeon: Christia Reading, MD;  Location: [facility];  Service: ENT;  Laterality: N/A;  . Left heart catheterization with coronary angiogram N/A 07/26/2014    Procedure: LEFT HEART CATHETERIZATION WITH CORONARY ANGIOGRAM;  Surgeon: Runell Gess, MD;  Location: [facility] CATH LAB;  Service: Cardiovascular;  Laterality: N/A;   Family History  Problem Relation Age of Onset  . Hypertension Father   . Diabetes Father    Social History   Social History  .  Marital Status: Married    Spouse Name: N/A  . Number of Children: 3  . Years of Education: N/A   Occupational History  . Not on file.   Social History Main Topics  . Smoking status: Never Smoker   . Smokeless tobacco: Never Used  . Alcohol Use: No  . Drug Use: No  . Sexual Activity: No   Other Topics Concern  . Not on file   Social History Narrative   Lives at home with daughter in law.     Review of Systems:  Pertinent items are noted in HPI.  Physical Exam: Blood pressure 225/81, pulse 62, temperature 98.2 F (36.8 C), temperature source Oral, resp. rate 16, SpO2 97 %. Physical Exam  Constitutional: She is well-developed, well-nourished, and in no distress. No distress.  Eyes: EOM are normal. No scleral icterus.  Neck: Normal range of motion. No JVD present. No tracheal deviation present.  Cardiovascular: Normal rate, regular rhythm, normal heart sounds and intact distal pulses.   Pulmonary/Chest: Effort normal and breath sounds normal. No respiratory distress. She has no wheezes.  Abdominal: Soft. She exhibits no distension. There is no rebound and no guarding.  Minimal tenderness to deep palpation in LLQ.  Musculoskeletal: Normal range of motion. She exhibits no edema.  Neurological: She is alert.  Skin: Skin is warm and dry. No rash noted. She is not diaphoretic.     Lab results: Basic Metabolic Panel:  Recent Labs  16/10/96 0852  NA 138  K 3.9  CL 107  CO2 22  GLUCOSE 196*  BUN 20  CREATININE 0.81  CALCIUM 9.1   Liver Function Tests: No results for input(s): AST, ALT, ALKPHOS, BILITOT, PROT, ALBUMIN in the last 72 hours. No results for input(s): LIPASE, AMYLASE in the last 72 hours. No results for input(s): AMMONIA in the last 72 hours. CBC:  Recent Labs  04/27/15 0852  WBC 7.6  HGB 12.3  HCT 38.1  MCV 88.2  PLT 234   Cardiac Enzymes: No results for input(s): CKTOTAL, CKMB, CKMBINDEX, TROPONINI in the last 72 hours. BNP: No results for  input(s): PROBNP in the last 72 hours. D-Dimer:  Recent Labs  04/27/15 0852  DDIMER 0.77*   CBG: No results for input(s): GLUCAP in the last 72 hours. Hemoglobin A1C: No results for input(s): HGBA1C in the last 72 hours. Fasting Lipid Panel: No results for input(s): CHOL, HDL, LDLCALC, TRIG, CHOLHDL, LDLDIRECT in the last 72 hours. Thyroid Function Tests: No results for input(s): TSH, T4TOTAL, FREET4, T3FREE, THYROIDAB in the last 72 hours. Anemia Panel: No results for input(s): VITAMINB12, FOLATE, FERRITIN, TIBC, IRON, RETICCTPCT in the last 72 hours. Coagulation: No results for input(s): LABPROT, INR in the last 72 hours. Urine Drug Screen: Drugs of Abuse  No results found for: LABOPIA, COCAINSCRNUR, LABBENZ, AMPHETMU, THCU, LABBARB  Alcohol Level: No results for input(s): ETH in the last 72 hours. Urinalysis: No results for input(s): COLORURINE, LABSPEC, PHURINE, GLUCOSEU, HGBUR, BILIRUBINUR, KETONESUR, PROTEINUR, UROBILINOGEN, NITRITE, LEUKOCYTESUR in the last 72 hours.  Invalid input(s): APPERANCEUR Misc. Labs: BNP: 195  Imaging results:  Dg Chest 2 View  04/27/2015   CLINICAL DATA:  Acute onset of chest pain and shortness of breath. Current history of hypertension and hypercholesterolemia.  EXAM: CHEST  2 VIEW  COMPARISON:  12/22/2014 and earlier.  FINDINGS: AP erect and lateral images were obtained. Suboptimal inspiration accounts for crowded bronchovascular markings, especially in the bases, and accentuates the cardiac silhouette. Taking this into account, cardiac silhouette mildly enlarged, unchanged. Thoracic aorta mildly atherosclerotic, unchanged. Hilar and mediastinal contours otherwise unremarkable. Lungs clear. Bronchovascular markings normal. Pulmonary vascularity normal. No visible pleural effusions. No pneumothorax. Degenerative changes involving the visualized cervical spine and thoracic spine. Degenerative changes involving both shoulders.  IMPRESSION: Suboptimal  inspiration. Stable mild cardiomegaly. No acute cardiopulmonary disease.   Electronically Signed   By: Hulan Saas M.D.   On: 04/27/2015 09:34    Other results: EKG: TWI in II, III, aVF, V4-6, unchanged from previous.  Assessment & Plan by Problem: Principal Problem:  Chest pain Active Problems:   Hypothyroidism   Type II diabetes mellitus (HCC)   ANXIETY DISORDER, GENERALIZED   History of non-ST elevation myocardial infarction (NSTEMI)   CAD S/P percutaneous coronary angioplasty  Jo Larson is an 81 yo [place]atian speaking female with CAD, DMII, HTN, hypothyroid, HFpEF (grade 1; EF 60-65%; 2016), and h/o NSTEMI s/p DES to LCx.   Atypical chest pain: Sharp, sternal, intermittent, nonradiating chest pains that resolved spontaneously.  She is adherent to ASA/Brilinta.  Low concern for acute MI, PE, dissection.  No evidence of PTX on CXR.  No fluid overload on exam, with stable cardiomegaly and normal BNP.  Patient denies h/o GERD, though she is prescribed and taking Protonix.  Ddx is GERD vs MSK pain.  Will r/o ACS. - Troponin 1830  - ECG in AM - Nitro PRN - Morphine PRN - Metop tartrate 12.5 mg BID - Losartan 25 mg daily - Teley - Protonix  H/o NSTEMI s/p DES: Adherent to ASA/Brilinta.   - ASA - Brilinta - Metop tartrate 12.5 mg BID - Atorvastatin 80 mg daily  HTN: Hypertensive to 200 systolic in ED.  Likely 2/2 missing morning anti-hypertensives and anxiety > uncontrolled hypertension.  Will continue home meds and follow BP.   - Metop  - Losartan  HFpEF: Grade 1 (Jan 2016). No evidence of fluid overload.  Continue above medications.   - Metop - Losartan  DMII: Last A1c 7% in January. - Glimepiride  GERD: Protonix Hypothyroid: Synthroid Anxiety: 1 mg qHS PRN anxiety  FEN/GI:  - HH  DVT Ppx: Lovenox  Dispo: Disposition is deferred at this time, awaiting improvement of current medical problems. Anticipated discharge in approximately 1 day(s).   The  patient does not have a current PCP (Provider Not In System) and does need an [facility] hospital follow-up appointment after discharge.  The patient does not have transportation limitations that hinder transportation to clinic appointments.  Signed: Jana Half, MD 04/27/2015, 3:55 PM

## 2015-04-27 NOTE — ED Provider Notes (Signed)
CSN: [id number]     Arrival date & time 04/27/15  8657 History   First MD Initiated Contact with Patient 04/27/15 [phone]     Chief Complaint  Patient presents with  . Shortness of Breath  . Chest Pain     (Consider location/radiation/quality/duration/timing/severity/associated sxs/prior Treatment) HPI Comments: 81yo F w/ PMH including CAD s/p stenting, T2DM, HTN, HLD, hypothyroidism who p/w chest pain and shortness of breath. History limited due to the patient does not speak English and obtained with the assistance of the patient's daughter at bedside as well as Health visitor. Daughter states that the patient has intermittently had episodes of chest pain and shortness of breath in the morning. It occurs randomly and is not associated with exertion. Patient states that the chest pain is central. This morning, the patient's grandson found her short of breath complaining of pain which is why they brought her in today. No cough/cold symptoms, fevers, vomiting, or recent illness. She has not taken her medications yet this morning but has been compliant otherwise.  Patient is a 81 y.o. female presenting with shortness of breath and chest pain. The history is provided by a relative.  Shortness of Breath Associated symptoms: chest pain   Chest Pain Associated symptoms: shortness of breath     Past Medical History  Diagnosis Date  . Diabetes mellitus without complication (HCC)   . Hypertension   . Anxiety   . Hypothyroidism   . Angioedema     a. 05/2014 - felt to be 2/2 clindamycin therapy.  . Hyperlipemia   . CAD (coronary artery disease)     a. 07/2014 NSTEMI/PCI: LM nl, LAD min irregs, LCX 57m (4.0x18 Xience DES), LPDA 90 (2mm vessel->med Rx), RCA nl, EF 60%.  . Abnormal chest x-ray    Past Surgical History  Procedure Laterality Date  . Intubation nasotracheal N/A 04/03/2013    Procedure: FIBEROPTIC NASAL-TRACHEAL INTUBATION;  Surgeon: Christia Reading, MD;  Location: [facility];  Service: ENT;   Laterality: N/A;  . Left heart catheterization with coronary angiogram N/A 07/26/2014    Procedure: LEFT HEART CATHETERIZATION WITH CORONARY ANGIOGRAM;  Surgeon: Runell Gess, MD;  Location: [facility] CATH LAB;  Service: Cardiovascular;  Laterality: N/A;   Family History  Problem Relation Age of Onset  . Hypertension Father   . Diabetes Father    Social History  Substance Use Topics  . Smoking status: Never Smoker   . Smokeless tobacco: Never Used  . Alcohol Use: No   OB History    No data available     Review of Systems  Respiratory: Positive for shortness of breath.   Cardiovascular: Positive for chest pain.    10 Systems reviewed and are negative for acute change except as noted in the HPI.   Allergies  Clindamycin/lincomycin; Peanut butter flavor; and Peanuts  Home Medications   Prior to Admission medications   Medication Sig Start Date End Date Taking? Authorizing Provider  aspirin EC 81 MG tablet Take 1 tablet (81 mg total) by mouth once. 07/27/14   Ok Anis, NP  atorvastatin (LIPITOR) 80 MG tablet Take 1 tablet (80 mg total) by mouth daily at 6 PM. 07/27/14   Ok Anis, NP  cholecalciferol (VITAMIN D) 1000 UNITS tablet Take 1,000 Units by mouth daily.    Historical Provider, MD  EPINEPHrine (EPI-PEN) 0.3 mg/0.3 mL SOAJ injection Inject 0.3 mLs (0.3 mg total) into the muscle as needed (for resp distress from angioedema). 06/05/13   Antionette Poles  Tanja Port, NP  esomeprazole (NEXIUM) 40 MG capsule Take 40 mg by mouth daily before breakfast.    Historical Provider, MD  levothyroxine (SYNTHROID, LEVOTHROID) 50 MCG tablet Take 50 mcg by mouth daily before breakfast.    Historical Provider, MD  LORazepam (ATIVAN) 1 MG tablet Take 1 mg by mouth at bedtime.     Historical Provider, MD  losartan (COZAAR) 25 MG tablet Take 1 tablet (25 mg total) by mouth daily. 07/27/14   Ok Anis, NP  metFORMIN (GLUCOPHAGE) 500 MG tablet Take 1 tablet (500 mg total) by mouth 2  (two) times daily with a meal. 07/27/14   Ok Anis, NP  metoprolol tartrate (LOPRESSOR) 25 MG tablet Take 0.5 tablets (12.5 mg total) by mouth 2 (two) times daily. 07/27/14   Ok Anis, NP  nitroGLYCERIN (NITROSTAT) 0.4 MG SL tablet Place 1 tablet (0.4 mg total) under the tongue every 5 (five) minutes x 3 doses as needed for chest pain. 07/27/14   Ok Anis, NP  ticagrelor (BRILINTA) 90 MG TABS tablet Take 1 tablet (90 mg total) by mouth 2 (two) times daily. 07/27/14   Ok Anis, NP   BP 193/76 mmHg  Pulse 59  Temp(Src) 98.2 F (36.8 C) (Oral)  Resp 15  SpO2 98% Physical Exam  Constitutional: She is oriented to person, place, and time. She appears well-developed and well-nourished. No distress.  HENT:  Head: Normocephalic and atraumatic.  Mouth/Throat: Oropharynx is clear and moist.  Moist mucous membranes  Eyes: Conjunctivae are normal. Pupils are equal, round, and reactive to light.  Neck: Neck supple.  Cardiovascular: Normal rate, regular rhythm and normal heart sounds.   No murmur heard. Pulmonary/Chest: Effort normal and breath sounds normal.  Abdominal: Soft. Bowel sounds are normal. She exhibits no distension. There is no tenderness.  Musculoskeletal: She exhibits no edema.  Neurological: She is alert and oriented to person, place, and time.  Fluent speech  Skin: Skin is warm and dry.  Psychiatric: She has a normal mood and affect. Judgment normal.  Nursing note and vitals reviewed.   ED Course  Procedures (including critical care time) Labs Review Labs Reviewed  CBC  BASIC METABOLIC PANEL  BRAIN NATRIURETIC PEPTIDE  D-DIMER, QUANTITATIVE (NOT AT [facility])  I-STAT TROPOININ, ED    Imaging Review No results found. I have personally reviewed and evaluated these lab results as part of my medical decision-making.   EKG Interpretation   Date/Time:  Wednesday April 27 2015 08:28:29 EDT Ventricular Rate:  65 PR Interval:  132 QRS  Duration: 84 QT Interval:  378 QTC Calculation: 393 R Axis:   28 Text Interpretation:  Normal sinus rhythm Cannot rule out Anterior infarct  , age undetermined ST \\T \ T wave abnormality, consider inferolateral  ischemia Abnormal ECG T wave inversions in inferior and lateral leads are  present on previous EKG Confirmed by Severiano Utsey MD, Halima Fogal [phone]) on  04/27/2015 9:00:46 AM     Medications  pantoprazole (PROTONIX) EC tablet 40 mg (not administered)  levothyroxine (SYNTHROID, LEVOTHROID) tablet 50 mcg (not administered)  losartan (COZAAR) tablet 25 mg (not administered)  metoprolol tartrate (LOPRESSOR) tablet 12.5 mg (not administered)  ticagrelor (BRILINTA) tablet 90 mg (not administered)  aspirin chewable tablet 324 mg (324 mg Oral Given 04/27/15 0913)    MDM   Final diagnoses:  Chest pain, unspecified chest pain type    81 year old female with past medical history including CAD who presents with several episodes of chest pain and shortness  of breath, last occurring this morning. Patient well-appearing at presentation. She denied any symptoms currently. Vital signs notable for mild bradycardia and hypertension near 200 systolic. Patient has not taken medications today. No significant findings on physical exam. EKG on arrival showed no changes from previous EKG. Sent above labwork including troponin and d-dimer as well as chest x-ray. Gave the patient 324 mg aspirin as well as her home morning medications.  Initial troponin negative. BNP 196. D-dimer 0.77 and if age-adjusted to 0.81, is below cut-off thus PE seems unlikely. CXR without acute process. Pt unable to better characterize quality and frequency of chest pain episodes, and given known hx of CAD with NSTEMI earlier this year, I am concerned about ACS. I spoke with Dr. Jens Som of cardiology regarding patient's presentation. Pt admitted to general medicine for chest pain rule out.  Laurence Spates, MD 04/27/15 2012

## 2015-04-27 NOTE — ED Notes (Signed)
Daughter phone number:  [phone]

## 2015-04-27 NOTE — ED Notes (Signed)
Admitting at bedside 

## 2015-04-27 NOTE — ED Notes (Signed)
Pt here with family with c/o chest pain and shortness of breath. Pt does not speak english, family member translates. Pt has hx of HTN and high cholesterol. Pt alert and oriented, follows commands, no distress noted.

## 2015-04-27 NOTE — ED Notes (Signed)
Doctor at bedside.

## 2015-04-27 NOTE — Progress Notes (Signed)
Patient arrived on floor @ 1600

## 2015-04-28 DIAGNOSIS — E119 Type 2 diabetes mellitus without complications: Secondary | ICD-10-CM | POA: Diagnosis not present

## 2015-04-28 DIAGNOSIS — R0789 Other chest pain: Secondary | ICD-10-CM

## 2015-04-28 DIAGNOSIS — I5033 Acute on chronic diastolic (congestive) heart failure: Secondary | ICD-10-CM

## 2015-04-28 DIAGNOSIS — I10 Essential (primary) hypertension: Secondary | ICD-10-CM

## 2015-04-28 DIAGNOSIS — R079 Chest pain, unspecified: Secondary | ICD-10-CM | POA: Diagnosis not present

## 2015-04-28 DIAGNOSIS — R0602 Shortness of breath: Secondary | ICD-10-CM | POA: Diagnosis not present

## 2015-04-28 LAB — GLUCOSE, CAPILLARY
GLUCOSE-CAPILLARY: 157 mg/dL — AB (ref 65–99)
Glucose-Capillary: 276 mg/dL — ABNORMAL HIGH (ref 65–99)

## 2015-04-28 MED ORDER — METOPROLOL TARTRATE 25 MG PO TABS: 25.0000 mg | ORAL_TABLET | Freq: Two times a day (BID) | ORAL | Status: DC

## 2015-04-28 MED ORDER — LOSARTAN POTASSIUM 50 MG PO TABS: 50.0000 mg | ORAL_TABLET | Freq: Every day | ORAL | Status: DC

## 2015-04-28 MED ORDER — LOSARTAN POTASSIUM 25 MG PO TABS: 50.0000 mg | ORAL_TABLET | Freq: Every day | ORAL | Status: DC

## 2015-04-28 MED ORDER — HYDRALAZINE HCL 20 MG/ML IJ SOLN
10.0000 mg | Freq: Once | INTRAMUSCULAR | Status: AC
Start: 2015-04-28 — End: 2015-04-28
  Administered 2015-04-28: 10 mg via INTRAVENOUS
  Filled 2015-04-28: qty 1

## 2015-04-28 NOTE — Consult Note (Cosign Needed)
CARDIOLOGY CONSULT NOTE  Patient ID: Jo Larson, MRN: [id number], DOB/AGE: 79-Jan-1933 80 y.o. Admit date: 04/27/2015 Date of Consult: 04/28/2015  Primary Physician: PROVIDER NOT [place] Primary Cardiologist: Dr Percival Spanish Referring Physician: Dr Megan Salon  Chief Complaint: Chest pain Reason for Consultation: Chest pain/CAD  HPI: 81 year old woman with coronary artery disease, type 2 diabetes, and hypertension. She has a history of heart failure with preserved ejection fraction (EF 60-65%). The patient had a non-ST elevation infarction in January 2016 and was found to have single-vessel coronary artery disease involving the left circumflex, treated with a 4 mm drug-eluting stent. She had residual distal vessel disease with recommendation for medical therapy because of small vessel diameter.  The patient presents this admission with sharp fleeting chest pains. Her primary complaint is actually difficulty catching her breath. She woke up with these symptoms yesterday. Her chest pains have resolved. Shortness of breath has also resolved. She has had no cough, fevers, chills. She denies orthopnea or PND. She has been compliant with her medications. Her daughter is at the bedside and helps with the patient's language barrier.  Medical History:  Past Medical History  Diagnosis Date  . Diabetes mellitus without complication ([facility])   . Hypertension   . Anxiety   . Hypothyroidism   . Angioedema     a. 05/2014 - felt to be 2/2 clindamycin therapy.  . Hyperlipemia   . CAD (coronary artery disease)     a. 07/2014 NSTEMI/PCI: LM nl, LAD min irregs, LCX 54m(4.0x18 Xience DES), LPDA 90 (264mvessel->med Rx), RCA nl, EF 60%.  . Abnormal chest x-ray       Surgical History:  Past Surgical History  Procedure Laterality Date  . Intubation nasotracheal N/A 04/03/2013    Procedure: FIBEROPTIC NASAL-TRACHEAL INTUBATION;  Surgeon: DwMelida QuitterMD;  Location: [facility];  Service: ENT;  Laterality: N/A;  .  Left heart catheterization with coronary angiogram N/A 07/26/2014    Procedure: LEFT HEART CATHETERIZATION WITH CORONARY ANGIOGRAM;  Surgeon: JoLorretta HarpMD;  [facility];  Service: Cardiovascular;  Laterality: N/A;     Home Meds: Prior to Admission medications   Medication Sig Start Date End Date Taking? Authorizing Provider  aspirin EC 81 MG tablet Take 1 tablet (81 mg total) by mouth once. Patient taking differently: Take 81 mg by mouth daily.  07/27/14  Yes ChRogelia MireNP  atorvastatin (LIPITOR) 80 MG tablet Take 1 tablet (80 mg total) by mouth daily at 6 PM. 07/27/14  Yes ChRogelia MireNP  cholecalciferol (VITAMIN D) 1000 UNITS tablet Take 1,000 Units by mouth daily.   Yes Historical Provider, MD  EPINEPHrine (EPI-PEN) 0.3 mg/0.3 mL SOAJ injection Inject 0.3 mLs (0.3 mg total) into the muscle as needed (for resp distress from angioedema). 06/05/13  Yes PeErick ColaceNP  esomeprazole (NEXIUM) 40 MG capsule Take 40 mg by mouth daily before breakfast.   Yes Historical Provider, MD  glimepiride (AMARYL) 4 MG tablet Take 4 mg by mouth daily. 02/21/15  Yes Historical Provider, MD  levothyroxine (SYNTHROID, LEVOTHROID) 75 MCG tablet Take 75 mcg by mouth daily. 04/10/15  Yes Historical Provider, MD  LORazepam (ATIVAN) 1 MG tablet Take 1 mg by mouth at bedtime.    Yes Historical Provider, MD  losartan (COZAAR) 25 MG tablet Take 1 tablet (25 mg total) by mouth daily. 07/27/14  Yes ChRogelia MireNP  metFORMIN (GLUCOPHAGE) 500 MG tablet Take 1 tablet (500 mg total) by mouth  2 (two) times daily with a meal. 07/27/14  Yes Rogelia Mire, NP  metoprolol tartrate (LOPRESSOR) 25 MG tablet Take 0.5 tablets (12.5 mg total) by mouth 2 (two) times daily. 07/27/14  Yes Rogelia Mire, NP  nitroGLYCERIN (NITROSTAT) 0.4 MG SL tablet Place 1 tablet (0.4 mg total) under the tongue every 5 (five) minutes x 3 doses as needed for chest pain. 07/27/14  Yes Rogelia Mire, NP    ticagrelor (BRILINTA) 90 MG TABS tablet Take 1 tablet (90 mg total) by mouth 2 (two) times daily. 07/27/14  Yes Rogelia Mire, NP    Inpatient Medications:  . aspirin EC  81 mg Oral Daily  . atorvastatin  80 mg Oral q1800  . cholecalciferol  1,000 Units Oral Daily  . enoxaparin (LOVENOX) injection  40 mg Subcutaneous Q24H  . insulin aspart  0-9 Units Subcutaneous TID WC  . levothyroxine  75 mcg Oral QAC breakfast  . LORazepam  1 mg Oral QHS  . losartan  25 mg Oral Daily  . metoprolol tartrate  12.5 mg Oral BID  . pantoprazole  40 mg Oral Daily  . pneumococcal 23 valent vaccine  0.5 mL Intramuscular Tomorrow-1000  . ticagrelor  90 mg Oral BID      Allergies:  Allergies  Allergen Reactions  . Clindamycin/Lincomycin Swelling    Tongue swelling, angioedema  . Peanut Butter Flavor Shortness Of Breath    Swelling and airway closes  . Peanuts [Peanut Oil] Shortness Of Breath    Swelling and airway closes    Social History   Social History  . Marital Status: Married    Spouse Name: N/A  . Number of Children: 3  . Years of Education: N/A   Occupational History  . Not on file.   Social History Main Topics  . Smoking status: Never Smoker   . Smokeless tobacco: Never Used  . Alcohol Use: No  . Drug Use: No  . Sexual Activity: No   Other Topics Concern  . Not on file   Social History Narrative   Lives at home with daughter in law.      Family History  Problem Relation Age of Onset  . Hypertension Father   . Diabetes Father      Review of Systems: General: negative for chills, fever, night sweats or weight changes.  ENT: negative for rhinorrhea or epistaxis Cardiovascular: See history of present illness Dermatological: negative for rash Respiratory: negative for cough or wheezing, positive for shortness of breath GI: negative for nausea, vomiting, diarrhea, bright red blood per rectum, melena, or hematemesis GU: no hematuria, urgency, or  frequency Neurologic: negative for visual changes, syncope, headache, or dizziness. Positive for anxiety Heme: no easy bruising or bleeding Endo: negative for excessive thirst, thyroid disorder, or flushing Musculoskeletal: negative for joint pain or swelling, negative for myalgias  All other systems reviewed and are otherwise negative except as noted above.  Physical Exam: Blood pressure 179/67, pulse 56, temperature 98.6 F (37 C), temperature source Oral, resp. rate 18, weight 165 lb 5.5 oz (75 kg), SpO2 98 %. Pt is alert and oriented, WD, WN, pleasant elderly woman in no distress. HEENT: normal Neck: JVP normal. Carotid upstrokes normal without bruits. No thyromegaly. Lungs: equal expansion, clear bilaterally CV: Apex is discrete and nondisplaced, RRR without murmur or gallop Abd: soft, NT, +BS, no bruit, no hepatosplenomegaly Back: no CVA tenderness Ext: no C/C/E        DP/PT pulses intact and =  Skin: warm and dry without rash Neuro: CNII-XII intact             Strength intact = bilaterally    Labs:  Recent Labs  04/27/15 1911  TROPONINI <0.03   Lab Results  Component Value Date   WBC 7.6 04/27/2015   HGB 12.3 04/27/2015   HCT 38.1 04/27/2015   MCV 88.2 04/27/2015   PLT 234 04/27/2015    Recent Labs Lab 04/27/15 0852  NA 138  K 3.9  CL 107  CO2 22  BUN 20  CREATININE 0.81  CALCIUM 9.1  GLUCOSE 196*   Lab Results  Component Value Date   CHOL 241* 07/25/2014   HDL 33* 07/25/2014   LDLCALC 169* 07/25/2014   TRIG 195* 07/25/2014   Lab Results  Component Value Date   DDIMER 0.77* 04/27/2015    Radiology/Studies:  Dg Chest 2 View  04/27/2015   CLINICAL DATA:  Acute onset of chest pain and shortness of breath. Current history of hypertension and hypercholesterolemia.  EXAM: CHEST  2 VIEW  COMPARISON:  12/22/2014 and earlier.  FINDINGS: AP erect and lateral images were obtained. Suboptimal inspiration accounts for crowded bronchovascular markings,  especially in the bases, and accentuates the cardiac silhouette. Taking this into account, cardiac silhouette mildly enlarged, unchanged. Thoracic aorta mildly atherosclerotic, unchanged. Hilar and mediastinal contours otherwise unremarkable. Lungs clear. Bronchovascular markings normal. Pulmonary vascularity normal. No visible pleural effusions. No pneumothorax. Degenerative changes involving the visualized cervical spine and thoracic spine. Degenerative changes involving both shoulders.  IMPRESSION: Suboptimal inspiration. Stable mild cardiomegaly. No acute cardiopulmonary disease.   Electronically Signed   By: Evangeline Dakin M.D.   On: 04/27/2015 09:34    EKG: NSR, nonspecific T wave changes, no significant ST abnormality  Cardiac Studies: 2D Echo 07/27/2014: Study Conclusions  - Left ventricle: The cavity size was normal. Wall thickness was normal. Systolic function was normal. The estimated ejection fraction was in the range of 60% to 65%. Wall motion was normal; there were no regional wall motion abnormalities. Doppler parameters are consistent with abnormal left ventricular relaxation (grade 1 diastolic dysfunction). The E/e&' ratio is between 8-15, suggesting indeterminate LV filling pressure. - Aortic valve: Trileaflet. Sclerosis without stenosis. There was no regurgitation. - Left atrium: The atrium was normal in size. - Tricuspid valve: There was mild regurgitation. - Pulmonary arteries: PA peak pressure: 40 mm Hg (S). - Inferior vena cava: The vessel was dilated. The respirophasic diameter changes were blunted (< 50%), consistent with elevated central venous pressure.  Impressions:  - Compared to the prior echo in 2011, there appear to be few changes. There is now mild pulmonary hypertension, diastolic dysfunction and indeterminate (but likely elevated) LV filling pressure.  Cardiac Cath 07/26/2014: HEMODYNAMICS:   AO SYSTOLIC/AO DIASTOLIC:  342/87  LV SYSTOLIC/LV DIASTOLIC: 681/15  ANGIOGRAPHIC RESULTS:  1. Left main; normal  2. LAD; minor irregularities 3. Left circumflex; this is a large vessel though nondominant. It was the "culprit vessel). There was a 95% fairly focal proximal to mid AV groove stenosis. There was a 90% PDA branch stenosis and a relatively small vessel..  4. Right coronary artery; codominant and free of significant disease 5. Left ventriculography; RAO left ventriculogram was performed using  25 mL of Visipaque dye at 12 mL/second. The overall LVEF estimated  60 % Without wall motion abnormalities  IMPRESSION:high-grade proximal to mid AV groove stenosis probably representing "the culprit lesion with normal LV function. We will proceed with PCI and stenting using  Angiomax, and Brilenta.  Procedure description: Using a 6 [place] XB 3 guide catheter along with a 0.14/190 cm long pro-water guidewire and a 2 mm x 12 mm long balloon predilatation was performed. The patient received 180 mg of Brilenta by mouth. She was already on aspirin. She received into max bolus with an ACT of 497. After predilatation stenting was performed with a 4 mm x 18 mm long Xience drug-eluting stent deployed at 12 atm and postdilated with a 4 mm x 15 mm nominal long noncompliant balloon at 18 atm (4.2 mm) resulting reduction of a 95% focal proximal to mid AV groove stenosis to 0% residual. The patient tolerated the procedure well. She has residual 90% stenosis in the small PDA probably less than 2 mm in diameter which will be treated medically. The guidewire and sheath were removed. A TR band was placed on the right wrist to achieve patent hemostasis. The patient left the lab in stable condition.  Final impression: Successful PCI and stenting of high-grade proximal to mid AV groove circumflex stenosis to 0% residual with a drug eluting stent, Angiomax and Brilenta. The patient will most likely be discharged home tomorrow. She will need  a minimum of 1 year of uninterrupted dual antiplatelet therapy.  ASSESSMENT AND PLAN:  1. Chest pain, atypical: The patient had sharp fleeting chest pains. Her troponin values are normal. Her EKG shows no acute changes. I do not think she requires further evaluation at this time as the pretest probability of ischemic chest pain is very low. She should continue on her current medical therapy for coronary artery disease after undergoing PCI earlier this year for treatment of non-ST elevation infarction.  2. CAD, native vessel: As above, her angina is quite atypical at this time. She remains on dual antiplatelets therapy after non-STEMI within the past 12 months treated with PCI. She seems to be tolerating this reasonably well.  3. Acute on chronic diastolic heart failure. The patient does have diastolic dysfunction on echo. Her BNP is elevated and she presents with shortness of breath. While there is no radiographic evidence of pulmonary edema on chest x-ray, it is quite possible that her shortness of breath is related to diastolic heart failure. Her symptoms seem to be improved with initial inpatient therapies. She has not received diuretics to date. Suspect this is related to poorly controlled hypertension.  4. Hypertensive heart disease with diastolic heart failure: Recommend increase metoprolol to 25 mg twice a day, increase losartan to 50 mg daily. I would have a low threshold to add a diuretic if her blood pressure remains suboptimally controlled.  Dispo: Would give hydralazine 10 mg IV x 1 to treat severe HTN, then should be ok to discharge with outpatient Rx of her HTN as outlined.  Deatra James MD, [facility] 04/28/2015, 9:47 AM

## 2015-04-28 NOTE — Progress Notes (Signed)
Pt BP 200/67, asymptomatic. Dr. Ladona Ridgel paged and awaiting instructions. Will continue to monitor.

## 2015-04-28 NOTE — Progress Notes (Signed)
Subjective: NAEON. Patient denies chest pain since presenting to the ED.  She has tolerated PO diet.  She is happy and feels safe for discharge.  Objective: Vital signs in last 24 hours: Filed Vitals:   04/27/15 1631 04/27/15 1956 04/27/15 1957 04/28/15 0504  BP: 195/87 138/115 170/57 179/67  Pulse: 60 68 64 56  Temp: 99.3 F (37.4 C) 99.3 F (37.4 C)  98.6 F (37 C)  TempSrc: Oral Oral  Oral  Resp:  18  18  Weight: 165 lb (74.844 kg)   165 lb 5.5 oz (75 kg)  SpO2: 96% 98%  98%   Weight change:  No intake or output data in the 24 hours ending 04/28/15 0929 Physical Exam  Constitutional: She is oriented to person, place, and time and well-developed, well-nourished, and in no distress. No distress.  HENT:  Head: Normocephalic and atraumatic.  Eyes: EOM are normal. No scleral icterus.  Neck: Normal range of motion. No JVD present. No tracheal deviation present.  Cardiovascular: Normal rate, regular rhythm, normal heart sounds and intact distal pulses.   Pulmonary/Chest: Effort normal. No respiratory distress.  Minimal scattered wheezes  Abdominal: Soft. She exhibits no distension. There is no rebound and no guarding.  Minimal tenderness in LLQ to deep palpation.  Musculoskeletal: Normal range of motion.  Trace peripheral edema bilaterally.  Neurological: She is alert and oriented to person, place, and time.  Skin: Skin is warm and dry. No rash noted. She is not diaphoretic.    Lab Results: Basic Metabolic Panel:  Recent Labs Lab 04/27/15 0852  NA 138  K 3.9  CL 107  CO2 22  GLUCOSE 196*  BUN 20  CREATININE 0.81  CALCIUM 9.1   Liver Function Tests: No results for input(s): AST, ALT, ALKPHOS, BILITOT, PROT, ALBUMIN in the last 168 hours. No results for input(s): LIPASE, AMYLASE in the last 168 hours. No results for input(s): AMMONIA in the last 168 hours. CBC:  Recent Labs Lab 04/27/15 0852  WBC 7.6  HGB 12.3  HCT 38.1  MCV 88.2  PLT 234   Cardiac  Enzymes:  Recent Labs Lab 04/27/15 1911  TROPONINI <0.03   BNP: No results for input(s): PROBNP in the last 168 hours. D-Dimer:  Recent Labs Lab 04/27/15 0852  DDIMER 0.77*   CBG:  Recent Labs Lab 04/27/15 1627 04/27/15 2105 04/28/15 0731  GLUCAP 146* 288* 157*   Hemoglobin A1C: No results for input(s): HGBA1C in the last 168 hours. Fasting Lipid Panel: No results for input(s): CHOL, HDL, LDLCALC, TRIG, CHOLHDL, LDLDIRECT in the last 168 hours. Thyroid Function Tests:  Recent Labs Lab 04/27/15 1911  TSH 0.634   Coagulation: No results for input(s): LABPROT, INR in the last 168 hours. Anemia Panel: No results for input(s): VITAMINB12, FOLATE, FERRITIN, TIBC, IRON, RETICCTPCT in the last 168 hours. Urine Drug Screen: Drugs of Abuse  No results found for: LABOPIA, COCAINSCRNUR, LABBENZ, AMPHETMU, THCU, LABBARB  Alcohol Level: No results for input(s): ETH in the last 168 hours. Urinalysis: No results for input(s): COLORURINE, LABSPEC, PHURINE, GLUCOSEU, HGBUR, BILIRUBINUR, KETONESUR, PROTEINUR, UROBILINOGEN, NITRITE, LEUKOCYTESUR in the last 168 hours.  Invalid input(s): APPERANCEUR Misc. Labs:   Micro Results: No results found for this or any previous visit (from the past 240 hour(s)). Studies/Results: Dg Chest 2 View  04/27/2015   CLINICAL DATA:  Acute onset of chest pain and shortness of breath. Current history of hypertension and hypercholesterolemia.  EXAM: CHEST  2 VIEW  COMPARISON:  12/22/2014 and  earlier.  FINDINGS: AP erect and lateral images were obtained. Suboptimal inspiration accounts for crowded bronchovascular markings, especially in the bases, and accentuates the cardiac silhouette. Taking this into account, cardiac silhouette mildly enlarged, unchanged. Thoracic aorta mildly atherosclerotic, unchanged. Hilar and mediastinal contours otherwise unremarkable. Lungs clear. Bronchovascular markings normal. Pulmonary vascularity normal. No visible  pleural effusions. No pneumothorax. Degenerative changes involving the visualized cervical spine and thoracic spine. Degenerative changes involving both shoulders.  IMPRESSION: Suboptimal inspiration. Stable mild cardiomegaly. No acute cardiopulmonary disease.   Electronically Signed   By: Hulan Saas M.D.   On: 04/27/2015 09:34   Medications: I have reviewed the patient's current medications. Scheduled Meds: . aspirin EC  81 mg Oral Daily  . atorvastatin  80 mg Oral q1800  . cholecalciferol  1,000 Units Oral Daily  . enoxaparin (LOVENOX) injection  40 mg Subcutaneous Q24H  . insulin aspart  0-9 Units Subcutaneous TID WC  . levothyroxine  75 mcg Oral QAC breakfast  . LORazepam  1 mg Oral QHS  . losartan  25 mg Oral Daily  . metoprolol tartrate  12.5 mg Oral BID  . pantoprazole  40 mg Oral Daily  . pneumococcal 23 valent vaccine  0.5 mL Intramuscular Tomorrow-1000  . ticagrelor  90 mg Oral BID   Continuous Infusions:  PRN Meds:.acetaminophen, morphine injection, nitroGLYCERIN Assessment/Plan: Principal Problem:   Chest pain, atypical Active Problems:   Hypothyroidism   Type II diabetes mellitus (HCC)   ANXIETY DISORDER, GENERALIZED   History of non-ST elevation myocardial infarction (NSTEMI)   CAD S/P percutaneous coronary angioplasty  Ms. Wiegel is an 81 yo [place]atian speaking female with CAD, DMII, HTN, hypothyroid, HFpEF (grade 1; EF 60-65%; 2016), and h/o NSTEMI s/p DES to LCx.   Atypical chest pain: Sharp, sternal, intermittent, nonradiating chest pains that resolved spontaneously. She is adherent to ASA/Brilinta. Low concern for acute MI, PE, dissection. No evidence of PTX on CXR. No fluid overload on exam, with stable cardiomegaly and normal BNP. ECG normal this morning.  ACS has been ruled out.  Patient denies h/o GERD, though she is prescribed and taking Protonix. Ddx remains GERD vs MSK pain. Patient to f/u with PCP and cardiologist post-discharge. - Metop  tartrate 12.5 mg BID - Losartan 25 mg daily - Protonix  H/o NSTEMI s/p DES: Adherent to ASA/Brilinta.  - ASA - Brilinta - Metop tartrate 12.5 mg BID - Atorvastatin 80 mg daily  HTN: Hypertensive to 200 systolic in ED. Likely 2/2 missing morning anti-hypertensives and anxiety > uncontrolled hypertension. Will continue home meds and follow BP.  - Metop  - Losartan  HFpEF: Grade 1 (Jan 2016). No evidence of fluid overload. Continue above medications.  - Metop - Losartan  DMII: Last A1c 7% in January. - Glimepiride  GERD: Protonix Hypothyroid: Synthroid Anxiety: 1 mg qHS PRN anxiety  FEN/GI:  - HH  DVT Ppx: Lovenox  Dispo: Disposition is deferred at this time, awaiting improvement of current medical problems. Anticipated discharge in approximately 1 day(s).    The patient does have a current PCP Lerry Liner, MD) and does need an [facility] hospital follow-up appointment after discharge.  The patient does not have transportation limitations that hinder transportation to clinic appointments.  .Services Needed at time of discharge: Y = Yes, Blank = No PT:   OT:   RN:   Equipment:   Other:       Jana Half, MD 04/28/2015, 9:29 AM

## 2015-04-28 NOTE — H&P (Signed)
   Date: 04/28/2015  Patient name: Jo Larson  Medical record number: [id number]  Date of birth: May 29, 1932   I have seen and evaluated Jo Larson and discussed their care with the Residency Team.   Assessment and Plan: I have seen and evaluated the patient as outlined above. I agree with the formulated Assessment and Plan as detailed in the residents' admission note. [name] had sharp, fleeting chest pain yesterday morning while sitting down. The pain resolved spontaneously. She has no new EKG changes and her cardiac enzymes are normal. There is no evidence for an acute coronary syndrome. Her daughter-in-law notes that she will sometimes have epigastric discomfort after taking her 7 pills in the morning. She wonders if that may be causing some of the pain. She states that the pain yesterday morning did began shortly after taking her medication. Her blood pressure remains above goal. We made adjustments in her antihypertensive therapy and she knows how to arrange a walk-in visit with her primary care provider, Dr. Britta Mccreedy.  Cliffton Asters, MD 10/6/201611:37 AM

## 2015-04-28 NOTE — Progress Notes (Signed)
Discharge teaching reviewed with pt and pts daughter Musician). Pt understands instructions. VSS. Pt will be discharging home with daughter.

## 2015-04-28 NOTE — Progress Notes (Signed)
Inpatient Diabetes Program Recommendations  AACE/ADA: New Consensus Statement on Inpatient Glycemic Control (2015)  Target Ranges:  Prepandial:   less than 140 mg/dL      Peak postprandial:   less than 180 mg/dL (1-2 hours)      Critically ill patients:  140 - 180 mg/dL   Review of Glycemic Control  Diabetes history: DM 2 Outpatient Diabetes medications: Amaryl 4 mg Daily, Metformin 500 mg BID Current orders for Inpatient glycemic control: Novolog Sensitive TID  Inpatient Diabetes Program Recommendations: Correction (SSI): Patient takes 2 oral medications at home. Please consider increasing correction to at least to Novolog Moderate scale TID. Please also start HS scale.  Thanks,  Christena Deem RN, MSN, [facility] Inpatient Diabetes Coordinator Team Pager [phone] (8a-5p)

## 2015-04-28 NOTE — Discharge Instructions (Signed)
1. Continue to take your Aspirin and Ticagrelor, and blood pressure medications, daily as prescribed. 2. Take Nitroglycerin under the tongue as needed for chest pain. 3. Follow up with your PCP, Jo Liner, MD, within one week. 4. Follow up Dr. Antoine Poche, your cardiologist, on 05/20/15 at 3 pm.   Chest Pain Observation It is often hard to give a specific diagnosis for the cause of chest pain. Among other possibilities your symptoms might be caused by inadequate oxygen delivery to your heart (angina). Angina that is not treated or evaluated can lead to a heart attack (myocardial infarction) or death. Blood tests, electrocardiograms, and X-rays may have been done to help determine a possible cause of your chest pain. After evaluation and observation, your health care provider has determined that it is unlikely your pain was caused by an unstable condition that requires hospitalization. However, a full evaluation of your pain may need to be completed, with additional diagnostic testing as directed. It is very important to keep your follow-up appointments. Not keeping your follow-up appointments could result in permanent heart damage, disability, or death. If there is any problem keeping your follow-up appointments, you must call your health care provider. HOME CARE INSTRUCTIONS  Due to the slight chance that your pain could be angina, it is important to follow your health care provider's treatment plan and also maintain a healthy lifestyle:  Maintain or work toward achieving a healthy weight.  Stay physically active and exercise regularly.  Decrease your salt intake.  Eat a balanced, healthy diet. Talk to a dietitian to learn about heart-healthy foods.  Increase your fiber intake by including whole grains, vegetables, fruits, and nuts in your diet.  Avoid situations that cause stress, anger, or depression.  Take medicines as advised by your health care provider. Report any side effects to  your health care provider. Do not stop medicines or adjust the dosages on your own.  Quit smoking. Do not use nicotine patches or gum until you check with your health care provider.  Keep your blood pressure, blood sugar, and cholesterol levels within normal limits.  Limit alcohol intake to no more than 1 drink per day for women who are not pregnant and 2 drinks per day for men.  Do not abuse drugs. SEEK IMMEDIATE MEDICAL CARE IF: You have severe chest pain or pressure which may include symptoms such as:  You feel pain or pressure in your arms, neck, jaw, or back.  You have severe back or abdominal pain, feel sick to your stomach (nauseous), or throw up (vomit).  You are sweating profusely.  You are having a fast or irregular heartbeat.  You feel short of breath while at rest.  You notice increasing shortness of breath during rest, sleep, or with activity.  You have chest pain that does not get better after rest or after taking your usual medicine.  You wake from sleep with chest pain.  You are unable to sleep because you cannot breathe.  You develop a frequent cough or you are coughing up blood.  You feel dizzy, faint, or experience extreme fatigue.  You develop severe weakness, dizziness, fainting, or chills. Any of these symptoms may represent a serious problem that is an emergency. Do not wait to see if the symptoms will go away. Call your local emergency services (911 in the [place] Do not drive yourself to the hospital. MAKE SURE YOU:  Understand these instructions.  Will watch your condition.  Will get help right away if you  are not doing well or get worse.   This information is not intended to replace advice given to you by your health care provider. Make sure you discuss any questions you have with your health care provider.   Document Released: 08/11/2010 Document Revised: 07/14/2013 Document Reviewed: 01/08/2013 Elsevier Interactive Patient Education 2016  Elsevier Inc[employer]  Chest Wall Pain Chest wall pain is pain in or around the bones and muscles of your chest. Sometimes, an injury causes this pain. Sometimes, the cause may not be known. This pain may take several weeks or longer to get better. HOME CARE INSTRUCTIONS  Pay attention to any changes in your symptoms. Take these actions to help with your pain:   Rest as told by your health care provider.   Avoid activities that cause pain. These include any activities that use your chest muscles or your abdominal and side muscles to lift heavy items.   If directed, apply ice to the painful area:  Put ice in a plastic bag.  Place a towel between your skin and the bag.  Leave the ice on for 20 minutes, 2-3 times per day.  Take over-the-counter and prescription medicines only as told by your health care provider.  Do not use tobacco products, including cigarettes, chewing tobacco, and e-cigarettes. If you need help quitting, ask your health care provider.  Keep all follow-up visits as told by your health care provider. This is important. SEEK MEDICAL CARE IF:  You have a fever.  Your chest pain becomes worse.  You have new symptoms. SEEK IMMEDIATE MEDICAL CARE IF:  You have nausea or vomiting.  You feel sweaty or light-headed.  You have a cough with phlegm (sputum) or you cough up blood.  You develop shortness of breath.   This information is not intended to replace advice given to you by your health care provider. Make sure you discuss any questions you have with your health care provider.   Document Released: 07/09/2005 Document Revised: 03/30/2015 Document Reviewed: 10/04/2014 Elsevier Interactive Patient Education [employer]

## 2015-04-28 NOTE — Discharge Summary (Signed)
Name: Jo Larson MRN: [id number] DOB: [birth year] 81 y.o. PCP: Lerry Liner, MD  Date of Admission: 04/27/2015  8:31 AM Date of Discharge: 04/28/2015 Attending Physician: Cliffton Asters, MD  Discharge Diagnosis: 1. Atypical chest pain  Principal Problem:   Chest pain, atypical Active Problems:   HYPERTENSION, BENIGN ESSENTIAL   History of non-ST elevation myocardial infarction (NSTEMI)   CAD S/P percutaneous coronary angioplasty   Type II diabetes mellitus (HCC)   Hypothyroidism   ANXIETY DISORDER, GENERALIZED  Discharge Medications:   Medication List    TAKE these medications        aspirin EC 81 MG tablet  Take 1 tablet (81 mg total) by mouth once.     atorvastatin 80 MG tablet  Commonly known as:  LIPITOR  Take 1 tablet (80 mg total) by mouth daily at 6 PM.     cholecalciferol 1000 UNITS tablet  Commonly known as:  VITAMIN D  Take 1,000 Units by mouth daily.     EPINEPHrine 0.3 mg/0.3 mL Soaj injection  Commonly known as:  EPI-PEN  Inject 0.3 mLs (0.3 mg total) into the muscle as needed (for resp distress from angioedema).     esomeprazole 40 MG capsule  Commonly known as:  NEXIUM  Take 40 mg by mouth daily before breakfast.     glimepiride 4 MG tablet  Commonly known as:  AMARYL  Take 4 mg by mouth daily.     levothyroxine 75 MCG tablet  Commonly known as:  SYNTHROID, LEVOTHROID  Take 75 mcg by mouth daily.     LORazepam 1 MG tablet  Commonly known as:  ATIVAN  Take 1 mg by mouth at bedtime.     losartan 50 MG tablet  Commonly known as:  COZAAR  Take 1 tablet (50 mg total) by mouth daily.  Start taking on:  04/29/2015     metFORMIN 500 MG tablet  Commonly known as:  GLUCOPHAGE  Take 1 tablet (500 mg total) by mouth 2 (two) times daily with a meal.     metoprolol tartrate 25 MG tablet  Commonly known as:  LOPRESSOR  Take 1 tablet (25 mg total) by mouth 2 (two) times daily.     nitroGLYCERIN 0.4 MG SL tablet  Commonly known as:  NITROSTAT    Place 1 tablet (0.4 mg total) under the tongue every 5 (five) minutes x 3 doses as needed for chest pain.     ticagrelor 90 MG Tabs tablet  Commonly known as:  BRILINTA  Take 1 tablet (90 mg total) by mouth 2 (two) times daily.        Disposition and follow-up:   Jo Larson was discharged from [facility] in Good condition.  At the hospital follow up visit please address:  1.  Blood pressure, chest pain  2.  Labs / imaging needed at time of follow-up: none  3.  Pending labs/ test needing follow-up: none  Follow-up Appointments: Follow-up Information    Follow up with Rollene Rotunda, MD On 05/20/2015.   Specialty:  Cardiology   Why:  3 PM   Contact information:   [address] [place] [address] [phone]       Schedule an appointment as soon as possible for a visit with Dr. Lerry Liner.   Why:  to discuss blood pressure control      Discharge Instructions: Discharge Instructions    Call MD for:  difficulty breathing, headache or visual disturbances  Complete by:  As directed      Call MD for:  persistant dizziness or light-headedness    Complete by:  As directed      Call MD for:  severe uncontrolled pain    Complete by:  As directed      Diet - low sodium heart healthy    Complete by:  As directed      Increase activity slowly    Complete by:  As directed            Consultations: Treatment Team:  Rounding Lbcardiology, MD  Procedures Performed:  Dg Chest 2 View  04/27/2015   CLINICAL DATA:  Acute onset of chest pain and shortness of breath. Current history of hypertension and hypercholesterolemia.  EXAM: CHEST  2 VIEW  COMPARISON:  12/22/2014 and earlier.  FINDINGS: AP erect and lateral images were obtained. Suboptimal inspiration accounts for crowded bronchovascular markings, especially in the bases, and accentuates the cardiac silhouette. Taking this into account, cardiac silhouette mildly enlarged,  unchanged. Thoracic aorta mildly atherosclerotic, unchanged. Hilar and mediastinal contours otherwise unremarkable. Lungs clear. Bronchovascular markings normal. Pulmonary vascularity normal. No visible pleural effusions. No pneumothorax. Degenerative changes involving the visualized cervical spine and thoracic spine. Degenerative changes involving both shoulders.  IMPRESSION: Suboptimal inspiration. Stable mild cardiomegaly. No acute cardiopulmonary disease.   Electronically Signed   By: Hulan Saas M.D.   On: 04/27/2015 09:34    2D Echo: none  Cardiac Cath: none  Admission HPI: Jo Larson is an 81 yo [place]ian speaking female with CAD, DMII, HTN, hypothyroid, HFpEF (grade 1; EF 60-65%; 2016), and h/o NSTEMI s/p DES to LCx. History is obtained with patient's daughter acting as interpreter. Patient presents with 1 day history of CP and SOB. She reports symptoms started at 630 am, while sitting down. She reports intermittent, 2-3 second, sharp, sternal chest pains without radiation. The pain is nonpleuritic. It is not reproduced with palpation. She endorses SOB during the acute shooting pains, but denies SOB between episodes. The symptoms had resolved by the time she arrived to the ED without intervention (~7:30 am). She currently denies chest pain, palpitations, or SOB.  She had an NSTEMI in January 2016, with DES to LCx. 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 She reports adherence to ASA and Ticagrelor, though she missed today's dose 2/2 pain and going to ED. She reports the pain she experienced this morning is not similar to the pain she had in January. She denies leg pain or previous clot.  She denies a h/o GERD, though the patient is taking Protonix. She does not know why. She denies sternal burning or cough following eating or lying down.  She has a h/o DMII, on Glimepiride. Last A1c was January 2016 at 7.2%.   She reports adherence to  antihypertensive medications.  She denies F/C, cough, N/V, C/D, melena, hematochezia, dysuria, dizziness, or falls. She endorses nervousness/anxiety because she has had to come to the hospital, but otherwise has no acute complaints. She endorses diffuse sweats (for 30 years), 20-30 lb unintentional weight loss, and decreased appetite. She reports an EGD and colonoscopy in 2016 that were normal.  In the ED, ECG was unchanged from previous. CXR revealed stable cardiomegaly. Troponins were negative x2. BNP was normal at 195.  Hospital Course by problem list: Principal Problem:   Chest pain, atypical Active Problems:   HYPERTENSION, BENIGN ESSENTIAL   History of non-ST elevation myocardial infarction (NSTEMI)   CAD S/P percutaneous  coronary angioplasty   Type II diabetes mellitus (HCC)   Hypothyroidism   ANXIETY DISORDER, GENERALIZED   1. Chest Pain: Patient's pain had resolved by the time she arrived in the ED.  CXR and blood work were negative acute pathology.  She was observed overnight with a normal ECG and no troponinemia.  She was pain free on the morning of discharge.    Hypertension: Patient's BP was labile and often above 200 mmHg systolic.  She had missed her morning doses on day of presentation, but BP remained elevated on day of discharge.  She was given Haydralazine IV on day of discharge and patient promised to immediately follow up with her PCP.  Her antihypertensive medications were increased to Metoprolol tartrate 25 mg BID and Losartan 50 mg daily.  Incorrect prescriptions were sent to her [facility], but the pharmacy was called and correct prescriptions and dosing were verified with the pharmacist.  NSTEMI s/p DES to LCx: Patient denied anginal pain throughout admission.  She was maintained on ASA and Brilinta.  DMII: Patient maintained on SSI during admission.  She was discharged on her home Glimepiride.   Discharge Vitals:   BP 200/67 mmHg  Pulse 58  Temp(Src)  98.8 F (37.1 C) (Oral)  Resp 16  Wt 165 lb 5.5 oz (75 kg)  SpO2 100%  Discharge Labs:  Results for orders placed or performed during the hospital encounter of 04/27/15 (from the past 24 hour(s))  I-stat troponin, ED     Status: None   Collection Time: 04/27/15 12:31 PM  Result Value Ref Range   Troponin i, poc 0.00 0.00 - 0.08 ng/mL   Comment 3          Glucose, capillary     Status: Abnormal   Collection Time: 04/27/15  4:27 PM  Result Value Ref Range   Glucose-Capillary 146 (H) 65 - 99 mg/dL  TSH     Status: None   Collection Time: 04/27/15  7:11 PM  Result Value Ref Range   TSH 0.634 0.350 - 4.500 uIU/mL  Troponin I     Status: None   Collection Time: 04/27/15  7:11 PM  Result Value Ref Range   Troponin I <0.03 <0.031 ng/mL  Glucose, capillary     Status: Abnormal   Collection Time: 04/27/15  9:05 PM  Result Value Ref Range   Glucose-Capillary 288 (H) 65 - 99 mg/dL  Glucose, capillary     Status: Abnormal   Collection Time: 04/28/15  7:31 AM  Result Value Ref Range   Glucose-Capillary 157 (H) 65 - 99 mg/dL    Signed: Jana Half, MD 04/28/2015, 10:42 AM    Services Ordered on Discharge: none Equipment Ordered on Discharge: none

## 2015-05-20 ENCOUNTER — Encounter: Payer: Self-pay | Admitting: Cardiology

## 2015-05-20 ENCOUNTER — Ambulatory Visit (INDEPENDENT_AMBULATORY_CARE_PROVIDER_SITE_OTHER): Payer: Medicare Other | Admitting: Cardiology

## 2015-05-20 VITALS — BP 170/80 | HR 55 | Ht 60.0 in | Wt 165.5 lb

## 2015-05-20 DIAGNOSIS — I10 Essential (primary) hypertension: Secondary | ICD-10-CM | POA: Diagnosis not present

## 2015-05-20 DIAGNOSIS — E785 Hyperlipidemia, unspecified: Secondary | ICD-10-CM | POA: Diagnosis not present

## 2015-05-20 MED ORDER — LOSARTAN POTASSIUM 100 MG PO TABS: 100.0000 mg | ORAL_TABLET | Freq: Every day | ORAL | Status: DC

## 2015-05-20 NOTE — Progress Notes (Signed)
HPI  The patient presents for follow-up of non-Q-wave myocardial infarction. She was hospitalized on the second of this year with chest pain. She was found to have obstructive disease in the circumflex vessel requiring DES. Her EF was 60%.  She was hospitalized earlier this month and I saw these notes and reviewed the records. She had atypical chest pain. She states her interpreter. She has lots of aches and pains and lots of anxiety. However, its clear that she's not had any of the symptoms that was her previous MI. She is relatively inactive does little exercise in the house. She denies any acute shortness of breath, PND or orthopnea. She had no palpitations, presyncope or syncope.  Allergies  Allergen Reactions  . Clindamycin/Lincomycin Swelling    Tongue swelling, angioedema  . Peanut Butter Flavor Shortness Of Breath    Swelling and airway closes  . Peanuts [Peanut Oil] Shortness Of Breath    Swelling and airway closes    Current Outpatient Prescriptions  Medication Sig Dispense Refill  . aspirin EC 81 MG tablet Take 1 tablet (81 mg total) by mouth once. (Patient taking differently: Take 81 mg by mouth daily. )    . atorvastatin (LIPITOR) 80 MG tablet Take 1 tablet (80 mg total) by mouth daily at 6 PM. 30 tablet 6  . cholecalciferol (VITAMIN D) 1000 UNITS tablet Take 1,000 Units by mouth daily.    Marland Kitchen EPINEPHrine (EPI-PEN) 0.3 mg/0.3 mL SOAJ injection Inject 0.3 mLs (0.3 mg total) into the muscle as needed (for resp distress from angioedema). 1 Device 3  . esomeprazole (NEXIUM) 40 MG capsule Take 40 mg by mouth daily before breakfast.    . glimepiride (AMARYL) 4 MG tablet Take 4 mg by mouth daily.  5  . levothyroxine (SYNTHROID, LEVOTHROID) 75 MCG tablet Take 75 mcg by mouth daily.  5  . LORazepam (ATIVAN) 1 MG tablet Take 1 mg by mouth 2 (two) times daily.     Marland Kitchen losartan (COZAAR) 50 MG tablet Take 50 mg by mouth daily.    . metFORMIN (GLUCOPHAGE) 500 MG tablet Take 1 tablet (500 mg  total) by mouth 2 (two) times daily with a meal.    . metoprolol tartrate (LOPRESSOR) 25 MG tablet Take 1 tablet (25 mg total) by mouth 2 (two) times daily. 60 tablet 0  . nitroGLYCERIN (NITROSTAT) 0.4 MG SL tablet Place 1 tablet (0.4 mg total) under the tongue every 5 (five) minutes x 3 doses as needed for chest pain. 25 tablet 3  . ticagrelor (BRILINTA) 90 MG TABS tablet Take 1 tablet (90 mg total) by mouth 2 (two) times daily. 60 tablet 6   No current facility-administered medications for this visit.    Past Medical History  Diagnosis Date  . Diabetes mellitus without complication (HCC)   . Hypertension   . Anxiety   . Hypothyroidism   . Angioedema     a. 05/2014 - felt to be 2/2 clindamycin therapy.  . Hyperlipemia   . CAD (coronary artery disease)     a. 07/2014 NSTEMI/PCI: LM nl, LAD min irregs, LCX 72m (4.0x18 Xience DES), LPDA 90 (2mm vessel->med Rx), RCA nl, EF 60%.  . Abnormal chest x-ray     Past Surgical History  Procedure Laterality Date  . Intubation nasotracheal N/A 04/03/2013    Procedure: FIBEROPTIC NASAL-TRACHEAL INTUBATION;  Surgeon: Christia Reading, MD;  Location: [facility];  Service: ENT;  Laterality: N/A;  . Left heart catheterization with coronary angiogram N/A 07/26/2014  Procedure: LEFT HEART CATHETERIZATION WITH CORONARY ANGIOGRAM;  Surgeon: Runell GessJonathan J Berry, MD;  [facility];  Service: Cardiovascular;  Laterality: N/A;    ROS:  As stated in the HPI and negative for all other systems.  PHYSICAL EXAM BP 170/80 mmHg  Pulse 55  Ht 5' (1.524 m)  Wt 165 lb 8 oz (75.07 kg)  BMI 32.32 kg/m2 GENERAL:  Well appearing HEENT:  Pupils equal round and reactive, fundi not visualized, oral mucosa unremarkable NECK:  No jugular venous distention, waveform within normal limits, carotid upstroke brisk and symmetric, no bruits, no thyromegaly LYMPHATICS:  No cervical, inguinal adenopathy LUNGS:  Clear to auscultation bilaterally BACK:  No CVA tenderness CHEST:   Unremarkable HEART:  PMI not displaced or sustained,S1 and S2 within normal limits, no S3, no S4, no clicks, no rubs,  murmurs ABD:  Flat, positive bowel sounds normal in frequency in pitch, no bruits, no rebound, no guarding, no midline pulsatile mass, no hepatomegaly, no splenomegaly EXT:  2 plus pulses throughout, no edema, no cyanosis no clubbing  EKG:    Sinus rhythm, rate 55, axis within normal limits, intervals within normal limits, no acute ST-T wave changes, lateral T-wave inversions unchanged from previous.  05/20/2015   ASSESSMENT AND PLAN   NSTEMI:  The patient has no new sypmtoms.  No further cardiovascular testing is indicated.  We will continue with aggressive risk reduction and meds as listed.  HTN:   Her blood pressure is elevated. I will increase her Cozaar to 100 mg.  HLD:     She should have a fasting lipid profile on the 80 mg of Lipitor and we will arrange this.  DM:  I will defer to her primary care physician.

## 2015-05-20 NOTE — Patient Instructions (Signed)
Your physician wants you to follow-up in: 6 Months. You will receive a reminder letter in the mail two months in advance. If you don't receive a letter, please call our office to schedule the follow-up appointment.  Your physician has recommended you make the following change in your medication: Increase Losartan 100 mg daily  Your physician recommends that you return for lab work Fasting Lipids

## 2015-05-24 LAB — LIPID PANEL
Cholesterol: 101 mg/dL — ABNORMAL LOW (ref 125–200)
HDL: 33 mg/dL — ABNORMAL LOW
LDL Cholesterol: 34 mg/dL
Total CHOL/HDL Ratio: 3.1 ratio
Triglycerides: 172 mg/dL — ABNORMAL HIGH
VLDL: 34 mg/dL — ABNORMAL HIGH

## 2015-05-30 ENCOUNTER — Encounter: Payer: Self-pay | Admitting: *Deleted

## 2015-06-01 ENCOUNTER — Other Ambulatory Visit: Payer: Self-pay | Admitting: Internal Medicine

## 2015-08-13 ENCOUNTER — Other Ambulatory Visit: Payer: Self-pay | Admitting: Internal Medicine

## 2016-03-13 ENCOUNTER — Emergency Department (HOSPITAL_COMMUNITY): Payer: Medicare Other

## 2016-03-13 ENCOUNTER — Encounter (HOSPITAL_COMMUNITY): Payer: Self-pay

## 2016-03-13 ENCOUNTER — Emergency Department (HOSPITAL_COMMUNITY)
Admission: EM | Admit: 2016-03-13 | Discharge: 2016-03-13 | Disposition: A | Discharge status: Home / Self Care | Payer: Medicare Other | Attending: Emergency Medicine | Admitting: Emergency Medicine

## 2016-03-13 DIAGNOSIS — Z79899 Other long term (current) drug therapy: Secondary | ICD-10-CM | POA: Insufficient documentation

## 2016-03-13 DIAGNOSIS — Z91010 Allergy to peanuts: Secondary | ICD-10-CM | POA: Diagnosis not present

## 2016-03-13 DIAGNOSIS — Z7984 Long term (current) use of oral hypoglycemic drugs: Secondary | ICD-10-CM | POA: Insufficient documentation

## 2016-03-13 DIAGNOSIS — I10 Essential (primary) hypertension: Secondary | ICD-10-CM | POA: Insufficient documentation

## 2016-03-13 DIAGNOSIS — R197 Diarrhea, unspecified: Secondary | ICD-10-CM | POA: Diagnosis not present

## 2016-03-13 DIAGNOSIS — E1165 Type 2 diabetes mellitus with hyperglycemia: Secondary | ICD-10-CM | POA: Diagnosis not present

## 2016-03-13 DIAGNOSIS — R4182 Altered mental status, unspecified: Secondary | ICD-10-CM | POA: Diagnosis present

## 2016-03-13 DIAGNOSIS — Z7982 Long term (current) use of aspirin: Secondary | ICD-10-CM | POA: Insufficient documentation

## 2016-03-13 DIAGNOSIS — E039 Hypothyroidism, unspecified: Secondary | ICD-10-CM | POA: Diagnosis not present

## 2016-03-13 DIAGNOSIS — I2510 Atherosclerotic heart disease of native coronary artery without angina pectoris: Secondary | ICD-10-CM | POA: Diagnosis not present

## 2016-03-13 DIAGNOSIS — F4321 Adjustment disorder with depressed mood: Secondary | ICD-10-CM | POA: Diagnosis not present

## 2016-03-13 DIAGNOSIS — R410 Disorientation, unspecified: Secondary | ICD-10-CM

## 2016-03-13 DIAGNOSIS — R739 Hyperglycemia, unspecified: Secondary | ICD-10-CM

## 2016-03-13 LAB — URINE MICROSCOPIC-ADD ON

## 2016-03-13 LAB — COMPREHENSIVE METABOLIC PANEL
ALBUMIN: 3.4 g/dL — AB (ref 3.5–5.0)
ALT: 18 U/L (ref 14–54)
ANION GAP: 7 (ref 5–15)
AST: 19 U/L (ref 15–41)
Alkaline Phosphatase: 96 U/L (ref 38–126)
BILIRUBIN TOTAL: 0.7 mg/dL (ref 0.3–1.2)
BUN: 25 mg/dL — ABNORMAL HIGH (ref 6–20)
CO2: 24 mmol/L (ref 22–32)
Calcium: 9.3 mg/dL (ref 8.9–10.3)
Chloride: 105 mmol/L (ref 101–111)
Creatinine, Ser: 0.95 mg/dL (ref 0.44–1.00)
GFR, EST NON AFRICAN AMERICAN: 54 mL/min — AB (ref >60)
GLUCOSE: 413 mg/dL — AB (ref 65–99)
POTASSIUM: 3.7 mmol/L (ref 3.5–5.1)
Sodium: 136 mmol/L (ref 135–145)
TOTAL PROTEIN: 7 g/dL (ref 6.5–8.1)

## 2016-03-13 LAB — CBC
HEMATOCRIT: 36.8 % (ref 36.0–46.0)
Hemoglobin: 12.1 g/dL (ref 12.0–15.0)
MCH: 28.9 pg (ref 26.0–34.0)
MCHC: 32.9 g/dL (ref 30.0–36.0)
MCV: 87.8 fL (ref 78.0–100.0)
Platelets: 232 10*3/uL (ref 150–400)
RBC: 4.19 MIL/uL (ref 3.87–5.11)
RDW: 13.6 % (ref 11.5–15.5)
WBC: 7.5 10*3/uL (ref 4.0–10.5)

## 2016-03-13 LAB — URINALYSIS, ROUTINE W REFLEX MICROSCOPIC
Bilirubin Urine: NEGATIVE
Glucose, UA: >1000 mg/dL — AB
Hgb urine dipstick: NEGATIVE
Ketones, ur: NEGATIVE mg/dL
LEUKOCYTES UA: NEGATIVE
NITRITE: NEGATIVE
PROTEIN: 30 mg/dL — AB
Specific Gravity, Urine: 1.029 (ref 1.005–1.030)
pH: 5.5 (ref 5.0–8.0)

## 2016-03-13 LAB — CBG MONITORING, ED
Glucose-Capillary: 334 mg/dL — ABNORMAL HIGH (ref 65–99)
Glucose-Capillary: 251 mg/dL — ABNORMAL HIGH (ref 65–99)

## 2016-03-13 LAB — TSH: TSH: 2.286 u[IU]/mL (ref 0.350–4.500)

## 2016-03-13 MED ORDER — SODIUM CHLORIDE 0.9 % IV BOLUS (SEPSIS)
1000.0000 mL | Freq: Once | INTRAVENOUS | Status: AC
  Administered 2016-03-13: 1000 mL via INTRAVENOUS

## 2016-03-13 MED ORDER — FAMOTIDINE 20 MG PO TABS
20.0000 mg | ORAL_TABLET | Freq: Once | ORAL | Status: AC
  Administered 2016-03-13: 20 mg via ORAL
  Filled 2016-03-13: qty 1

## 2016-03-13 MED ORDER — INSULIN ASPART 100 UNIT/ML ~~LOC~~ SOLN
8.0000 [IU] | Freq: Once | SUBCUTANEOUS | Status: AC
  Administered 2016-03-13: 8 [IU] via SUBCUTANEOUS
  Filled 2016-03-13: qty 1

## 2016-03-13 MED ORDER — SODIUM CHLORIDE 0.9 % IV BOLUS (SEPSIS)
500.0000 mL | Freq: Once | INTRAVENOUS | Status: AC
  Administered 2016-03-13: 500 mL via INTRAVENOUS

## 2016-03-13 MED ORDER — CEPHALEXIN 500 MG PO CAPS: 500.0000 mg | ORAL_CAPSULE | Freq: Three times a day (TID) | ORAL | 0 refills | Status: DC

## 2016-03-13 MED ORDER — CEPHALEXIN 500 MG PO CAPS
500.0000 mg | ORAL_CAPSULE | Freq: Once | ORAL | Status: AC
  Administered 2016-03-13: 500 mg via ORAL
  Filled 2016-03-13: qty 1

## 2016-03-13 NOTE — ED Triage Notes (Addendum)
Pt presents d/t intermittent confusion x 15 days and generalized pain and intermittent diarrhea x "a long time."  Pain score 2/10.  Denies n/v/d.      Pt's daughter-in-law reports that they brought her to ED, because her PCP could not fit her in.  Sts Pt was started on a new HTN medication prior to increased confusion starting.    Pt speaks [place]Bosnian and triage was completed w/ interpreter.

## 2016-03-13 NOTE — Discharge Instructions (Addendum)
It was our pleasure to provide your ER care today - we hope that you feel better.  Your lab work shows that your blood sugar is high (400) - drink adequate water, follow diabetic diet, continue diabetes medication, and follow up with primary care doctor in the coming week for recheck, and possible medication change.   The labs also show a possible urine infection - take antibiotic as prescribed.  Follow up with your doctor this week - discuss possible gero-psychiatric evaluation as outpatient. Also have blood pressure rechecked as it is high today.   Your ct scan was read as showing no acute process. Incidental note was made of "incompletely visualized fullness within the right fossa of Rosenmuller. Recommend correlation with direct visualization to exclude the possibility of nasopharyngeal carcinoma' - follow up with your doctor.  Return to ER if worse, new symptoms, fevers, trouble breathing, or other emergency.

## 2016-03-13 NOTE — ED Notes (Signed)
Discharge instructions reviewed with patient and daughter in law using Croatian/Bosnian interpreter.  DIL expresses understanding that she needs to f/u with PCP next week.

## 2016-03-13 NOTE — ED Provider Notes (Signed)
[facility]-EMERGENCY DEPT Provider Note   CSN: [id number] Arrival date & time: 03/13/16  1037     History   Chief Complaint Chief Complaint  Patient presents with  . Altered Mental Status    HPI Jo Larson is a 82 y.o. female.  Patient reportedly with periods of confusion for the past several weeks.  Family indicates will make comments about family member(s) who had died a long time ago.  At other times will make threats such as saying if they leave her alone she will burn the place down.   Family indicates these comments dont happen every day, but that they randomly occur.  Patient with episodes diarrhea stool for a long time, a couple times per day. No vomiting. No report of trauma or fall. No recent change in medication, unclear from hx whether patient taking her medications reliably or not. No fevers. Has been eating/drinking normally. Patient denies any specific c/o, but is difficult historian, even w interpreter - level 5 caveat.      The history is provided by the patient and a relative. The history is limited by the condition of the patient. A language interpreter was used.  Altered Mental Status   Associated symptoms include confusion.    Past Medical History:  Diagnosis Date  . Abnormal chest x-ray   . Angioedema    a. 05/2014 - felt to be 2/2 clindamycin therapy.  . Anxiety   . CAD (coronary artery disease)    a. 07/2014 NSTEMI/PCI: LM nl, LAD min irregs, LCX 2658m (4.0x18 Xience DES), LPDA 90 (2mm vessel->med Rx), RCA nl, EF 60%.  . Diabetes mellitus without complication (HCC)   . Hyperlipemia   . Hypertension   . Hypothyroidism     Patient Active Problem List   Diagnosis Date Noted  . Chest pain, atypical 04/27/2015  . CAD S/P percutaneous coronary angioplasty 07/27/2014  . Abnormal chest x-ray 07/27/2014  . History of non-ST elevation myocardial infarction (NSTEMI) 07/24/2014  . Hypokalemia 07/24/2014  . Angioedema 04/03/2013  . Acute respiratory failure  (HCC) 04/03/2013  . POSITIVE PPD 05/17/2010  . URINALYSIS, ABNORMAL 04/24/2010  . SYSTOLIC MURMUR 09/13/2009  . Hypothyroidism 01/05/2008  . Hyperlipidemia 03/03/2007  . ANXIETY DISORDER, GENERALIZED 03/03/2007  . INSOMNIA, PERSISTENT 03/03/2007  . OSTEOARTHRITIS 03/03/2007  . DEFICIENCY, VITAMIN D NOS 08/05/2006  . Type II diabetes mellitus (HCC) 06/04/1997  . HYPERTENSION, BENIGN ESSENTIAL 07/23/1981    Past Surgical History:  Procedure Laterality Date  . INTUBATION NASOTRACHEAL N/A 04/03/2013   Procedure: FIBEROPTIC NASAL-TRACHEAL INTUBATION;  Surgeon: Christia Readingwight Bates, MD;  Location: [facility];  Service: ENT;  Laterality: N/A;  . LEFT HEART CATHETERIZATION WITH CORONARY ANGIOGRAM N/A 07/26/2014   Procedure: LEFT HEART CATHETERIZATION WITH CORONARY ANGIOGRAM;  Surgeon: Runell GessJonathan J Berry, MD;  [facility];  Service: Cardiovascular;  Laterality: N/A;    OB History    No data available       Home Medications    Prior to Admission medications   Medication Sig Start Date End Date Taking? Authorizing Provider  aspirin EC 81 MG tablet Take 1 tablet (81 mg total) by mouth once. Patient taking differently: Take 81 mg by mouth daily.  07/27/14   Ok Anishristopher R Berge, NP  atorvastatin (LIPITOR) 80 MG tablet Take 1 tablet (80 mg total) by mouth daily at 6 PM. 07/27/14   Ok Anishristopher R Berge, NP  cholecalciferol (VITAMIN D) 1000 UNITS tablet Take 1,000 Units by mouth daily.    Historical Provider, MD  EPINEPHrine (EPI-PEN) 0.3 mg/0.3 mL SOAJ injection Inject 0.3 mLs (0.3 mg total) into the muscle as needed (for resp distress from angioedema). 06/05/13   Simonne MartinetPeter E Babcock, NP  esomeprazole (NEXIUM) 40 MG capsule Take 40 mg by mouth daily before breakfast.    Historical Provider, MD  glimepiride (AMARYL) 4 MG tablet Take 4 mg by mouth daily. 02/21/15   Historical Provider, MD  levothyroxine (SYNTHROID, LEVOTHROID) 75 MCG tablet Take 75 mcg by mouth daily. 04/10/15   Historical Provider, MD  LORazepam  (ATIVAN) 1 MG tablet Take 1 mg by mouth 2 (two) times daily.     Historical Provider, MD  losartan (COZAAR) 100 MG tablet Take 1 tablet (100 mg total) by mouth daily. 05/20/15   Rollene RotundaJames Hochrein, MD  metFORMIN (GLUCOPHAGE) 500 MG tablet Take 1 tablet (500 mg total) by mouth 2 (two) times daily with a meal. 07/27/14   Ok Anishristopher R Berge, NP  metoprolol tartrate (LOPRESSOR) 25 MG tablet Take 1 tablet (25 mg total) by mouth 2 (two) times daily. 04/28/15   Jana HalfNicholas A Taylor, MD  nitroGLYCERIN (NITROSTAT) 0.4 MG SL tablet Place 1 tablet (0.4 mg total) under the tongue every 5 (five) minutes x 3 doses as needed for chest pain. 07/27/14   Ok Anishristopher R Berge, NP  ticagrelor (BRILINTA) 90 MG TABS tablet Take 1 tablet (90 mg total) by mouth 2 (two) times daily. 07/27/14   Ok Anishristopher R Berge, NP    Family History Family History  Problem Relation Age of Onset  . Hypertension Father   . Diabetes Father     Social History Social History  Substance Use Topics  . Smoking status: Never Smoker  . Smokeless tobacco: Never Used  . Alcohol use No     Allergies   Clindamycin/lincomycin; Peanut butter flavor; and Peanuts [peanut oil]   Review of Systems Review of Systems  Unable to perform ROS: Mental status change  Constitutional: Negative for fever.  Eyes: Negative for visual disturbance.  Respiratory: Negative for cough and shortness of breath.   Cardiovascular: Negative for chest pain.  Gastrointestinal: Positive for diarrhea. Negative for vomiting.  Genitourinary: Negative for dysuria.  Musculoskeletal: Negative for neck pain.  Skin: Negative for rash.  Neurological: Negative for headaches.  Hematological: Does not bruise/bleed easily.  Psychiatric/Behavioral: Positive for confusion.     Physical Exam Updated Vital Signs BP (!) 188/119 (BP Location: Left Arm)   Pulse 63   Temp 98.7 F (37.1 C) (Oral)   Resp 16   SpO2 98%   Physical Exam  Constitutional: She appears well-developed and  well-nourished. No distress.  HENT:  Head: Atraumatic.  Nose: Nose normal.  Mouth/Throat: Oropharynx is clear and moist.  No nasal mass noted on exam w otoscope.   Eyes: Conjunctivae are normal. Pupils are equal, round, and reactive to light. No scleral icterus.  Neck: Neck supple. No tracheal deviation present. No thyromegaly present.  No bruits  Cardiovascular: Normal rate, regular rhythm, normal heart sounds and intact distal pulses.   No murmur heard. Pulmonary/Chest: Effort normal and breath sounds normal. No respiratory distress.  Abdominal: Soft. Normal appearance and bowel sounds are normal. She exhibits no distension. There is no tenderness.  Genitourinary:  Genitourinary Comments: No cva tenderness  Musculoskeletal: She exhibits no edema or deformity.  Neurological: She is alert.  Speech fluent. Moves bil ext purposefully w good strength.  No facial asymmetry or droop.   Skin: Skin is warm and dry. No rash noted. She is not diaphoretic.  Psychiatric:  Alert, content appearing.   Nursing note and vitals reviewed.    ED Treatments / Results  Labs (all labs ordered are listed, but only abnormal results are displayed) Results for orders placed or performed during the hospital encounter of 03/13/16  CBC  Result Value Ref Range   WBC 7.5 4.0 - 10.5 K/uL   RBC 4.19 3.87 - 5.11 MIL/uL   Hemoglobin 12.1 12.0 - 15.0 g/dL   HCT 16.1 09.6 - 04.5 %   MCV 87.8 78.0 - 100.0 fL   MCH 28.9 26.0 - 34.0 pg   MCHC 32.9 30.0 - 36.0 g/dL   RDW 40.9 81.1 - 91.4 %   Platelets 232 150 - 400 K/uL  Comprehensive metabolic panel  Result Value Ref Range   Sodium 136 135 - 145 mmol/L   Potassium 3.7 3.5 - 5.1 mmol/L   Chloride 105 101 - 111 mmol/L   CO2 24 22 - 32 mmol/L   Glucose, Bld 413 (H) 65 - 99 mg/dL   BUN 25 (H) 6 - 20 mg/dL   Creatinine, Ser 7.82 0.44 - 1.00 mg/dL   Calcium 9.3 8.9 - 95.6 mg/dL   Total Protein 7.0 6.5 - 8.1 g/dL   Albumin 3.4 (L) 3.5 - 5.0 g/dL   AST 19 15 -  41 U/L   ALT 18 14 - 54 U/L   Alkaline Phosphatase 96 38 - 126 U/L   Total Bilirubin 0.7 0.3 - 1.2 mg/dL   GFR calc non Af Amer 54 (L) >60 mL/min   GFR calc Af Amer >60 >60 mL/min   Anion gap 7 5 - 15  Urinalysis, Routine w reflex microscopic (not at [facility])  Result Value Ref Range   Color, Urine YELLOW YELLOW   APPearance CLOUDY (A) CLEAR   Specific Gravity, Urine 1.029 1.005 - 1.030   pH 5.5 5.0 - 8.0   Glucose, UA >1000 (A) NEGATIVE mg/dL   Hgb urine dipstick NEGATIVE NEGATIVE   Bilirubin Urine NEGATIVE NEGATIVE   Ketones, ur NEGATIVE NEGATIVE mg/dL   Protein, ur 30 (A) NEGATIVE mg/dL   Nitrite NEGATIVE NEGATIVE   Leukocytes, UA NEGATIVE NEGATIVE  TSH  Result Value Ref Range   TSH 2.286 0.350 - 4.500 uIU/mL  Urine microscopic-add on  Result Value Ref Range   Squamous Epithelial / LPF 6-30 (A) NONE SEEN   WBC, UA 0-5 0 - 5 WBC/hpf   RBC / HPF 0-5 0 - 5 RBC/hpf   Bacteria, UA MANY (A) NONE SEEN   Ct Head Wo Contrast  Result Date: 03/13/2016 CLINICAL DATA:  Intermittent confusion for 15 days. EXAM: CT HEAD WITHOUT CONTRAST TECHNIQUE: Contiguous axial images were obtained from the base of the skull through the vertex without intravenous contrast. COMPARISON:  None. FINDINGS: Brain: There is no mass lesion, intraparenchymal hemorrhage or extra-axial collection. No evidence of acute cortical infarct. No hydrocephalus. There is periventricular hypoattenuation compatible with chronic microvascular disease. Ventricles and sulci are upper limits normal for patient age. Vascular: No hyperdense vessel sign. Mild atherosclerotic calcification. Skull: Normal Sinuses/Orbits: Paranasal sinuses are free of fluid. Mastoids are clear. Orbits are normal. Other: Within the incompletely visualized nasopharynx, there is fullness of the right fossa of Rosenmuller. IMPRESSION: 1. No acute intracranial abnormality. Findings of chronic microvascular disease. 2. Incompletely visualized fullness within the right  fossa of Rosenmuller. Recommend correlation with direct visualization to exclude the possibility of nasopharyngeal carcinoma. Electronically Signed   By: Deatra Robinson M.D.   On: 03/13/2016 13:49  EKG  EKG Interpretation None       Radiology No results found.  Procedures Procedures (including critical care time)  Medications Ordered in ED Medications - No data to display   Initial Impression / Assessment and Plan / ED Course  I have reviewed the triage vital signs and the nursing notes.  Pertinent labs & imaging results that were available during my care of the patient were reviewed by me and considered in my medical decision making (see chart for details).  Clinical Course    Labs and imaging studies ordered.   Reviewed nursing notes and prior charts for additional history.   Labs noted, largely unremarkable except glucose elev 400, hco3 is normal  - iv ns bolus, novolog sq.  Po fluids - pt tolerating well.  Also noted are many bact on ua, will cx/rx.  No apparent nasal mass noted on visualization in ED - will have f/u pcp re CT findings noted.   Suspect patients episodic symptoms, may be c/w mild/new dementia w behavioral disturbance.  Rec close pcp f/u, with possible outpt geropsych eval.   Patient currently appears stable for d/c.     Final Clinical Impressions(s) / ED Diagnoses   Final diagnoses:  None    New Prescriptions New Prescriptions   No medications on file     Cathren Laine, MD 03/13/16 1428

## 2016-03-15 LAB — URINE CULTURE: Culture: >=100000 — AB

## 2016-03-16 ENCOUNTER — Telehealth (HOSPITAL_BASED_OUTPATIENT_CLINIC_OR_DEPARTMENT_OTHER): Payer: Self-pay | Admitting: Emergency Medicine

## 2016-03-16 NOTE — Telephone Encounter (Signed)
Post ED Visit - Positive Culture Follow-up  Culture report reviewed by antimicrobial stewardship pharmacist:  []  Enzo BiNathan Batchelder, Pharm.D. []  Celedonio MiyamotoJeremy Frens, Pharm.D., BCPS []  Garvin FilaMike Maccia, Pharm.D. []  Georgina PillionElizabeth Martin, Pharm.D., BCPS []  ColdfootMinh Pham, [address]Ph[address]D., BCPS, AAHIVP []  Estella HuskMichelle Turner, Pharm.D., BCPS, AAHIVP []  Tennis Mustassie Stewart, Pharm.D. []  Sherle Poeob Vincent, [address]Ph[address] Vianne BullsKai Kong RPh  Positive urine culture Treated with cephalexin, organism sensitive to the same and no further patient follow-up is required at this time.  Berle MullMiller, Leighla Chestnutt 03/16/2016, 4:47 PM

## 2016-03-20 ENCOUNTER — Emergency Department (HOSPITAL_COMMUNITY)
Admission: EM | Admit: 2016-03-20 | Discharge: 2016-03-20 | Disposition: A | Discharge status: Home / Self Care | Payer: Medicare Other | Attending: Emergency Medicine | Admitting: Emergency Medicine

## 2016-03-20 ENCOUNTER — Encounter (HOSPITAL_COMMUNITY): Payer: Self-pay | Admitting: Emergency Medicine

## 2016-03-20 DIAGNOSIS — E039 Hypothyroidism, unspecified: Secondary | ICD-10-CM | POA: Insufficient documentation

## 2016-03-20 DIAGNOSIS — Z7982 Long term (current) use of aspirin: Secondary | ICD-10-CM | POA: Diagnosis not present

## 2016-03-20 DIAGNOSIS — Z7984 Long term (current) use of oral hypoglycemic drugs: Secondary | ICD-10-CM | POA: Diagnosis not present

## 2016-03-20 DIAGNOSIS — Y929 Unspecified place or not applicable: Secondary | ICD-10-CM | POA: Diagnosis not present

## 2016-03-20 DIAGNOSIS — Z79899 Other long term (current) drug therapy: Secondary | ICD-10-CM | POA: Insufficient documentation

## 2016-03-20 DIAGNOSIS — R739 Hyperglycemia, unspecified: Secondary | ICD-10-CM

## 2016-03-20 DIAGNOSIS — Z91010 Allergy to peanuts: Secondary | ICD-10-CM | POA: Diagnosis not present

## 2016-03-20 DIAGNOSIS — W19XXXA Unspecified fall, initial encounter: Secondary | ICD-10-CM | POA: Insufficient documentation

## 2016-03-20 DIAGNOSIS — I2510 Atherosclerotic heart disease of native coronary artery without angina pectoris: Secondary | ICD-10-CM | POA: Diagnosis not present

## 2016-03-20 DIAGNOSIS — Y999 Unspecified external cause status: Secondary | ICD-10-CM | POA: Insufficient documentation

## 2016-03-20 DIAGNOSIS — I160 Hypertensive urgency: Secondary | ICD-10-CM

## 2016-03-20 DIAGNOSIS — Y939 Activity, unspecified: Secondary | ICD-10-CM | POA: Diagnosis not present

## 2016-03-20 DIAGNOSIS — E1165 Type 2 diabetes mellitus with hyperglycemia: Secondary | ICD-10-CM | POA: Insufficient documentation

## 2016-03-20 DIAGNOSIS — I10 Essential (primary) hypertension: Secondary | ICD-10-CM | POA: Diagnosis present

## 2016-03-20 LAB — CBC WITH DIFFERENTIAL/PLATELET
BASOS PCT: 0 %
Basophils Absolute: 0 10*3/uL (ref 0.0–0.1)
EOS ABS: 0.2 10*3/uL (ref 0.0–0.7)
Eosinophils Relative: 3 %
HEMATOCRIT: 34.5 % — AB (ref 36.0–46.0)
Hemoglobin: 11.4 g/dL — ABNORMAL LOW (ref 12.0–15.0)
LYMPHS ABS: 1.6 10*3/uL (ref 0.7–4.0)
Lymphocytes Relative: 23 %
MCH: 29 pg (ref 26.0–34.0)
MCHC: 33 g/dL (ref 30.0–36.0)
MCV: 87.8 fL (ref 78.0–100.0)
MONO ABS: 0.5 10*3/uL (ref 0.1–1.0)
MONOS PCT: 8 %
NEUTROS ABS: 4.6 10*3/uL (ref 1.7–7.7)
NEUTROS PCT: 66 %
Platelets: 224 10*3/uL (ref 150–400)
RBC: 3.93 MIL/uL (ref 3.87–5.11)
RDW: 13.5 % (ref 11.5–15.5)
WBC: 6.9 10*3/uL (ref 4.0–10.5)

## 2016-03-20 LAB — BASIC METABOLIC PANEL
Anion gap: 4 — ABNORMAL LOW (ref 5–15)
BUN: 20 mg/dL (ref 6–20)
CALCIUM: 9.1 mg/dL (ref 8.9–10.3)
CHLORIDE: 115 mmol/L — AB (ref 101–111)
CO2: 21 mmol/L — AB (ref 22–32)
CREATININE: 0.79 mg/dL (ref 0.44–1.00)
GFR calc non Af Amer: >60 mL/min (ref >60)
Glucose, Bld: 293 mg/dL — ABNORMAL HIGH (ref 65–99)
Potassium: 3.6 mmol/L (ref 3.5–5.1)
Sodium: 140 mmol/L (ref 135–145)

## 2016-03-20 LAB — I-STAT TROPONIN, ED: Troponin i, poc: 0.02 ng/mL (ref 0.00–0.08)

## 2016-03-20 MED ORDER — LABETALOL HCL 100 MG PO TABS
100.0000 mg | ORAL_TABLET | Freq: Once | ORAL | Status: AC
  Administered 2016-03-20: 100 mg via ORAL
  Filled 2016-03-20 (×2): qty 1

## 2016-03-20 NOTE — Discharge Instructions (Signed)
Increase your metformin to 1000 mg in the morning and 500 mg in the evening.  Increase your metoprolol to 25 mg twice daily.  Keep a record of your blood pressure and blood sugar over the next several days to take with you to your next doctor's appointment.

## 2016-03-20 NOTE — ED Triage Notes (Signed)
Pt from home [facility], reports fall today, right wrist pain , no obvious deformity per EMS. Also EMS reports 258/110, pt took her daily morning per family. They reports decreased loc since she was seen last week for UTI. Pt doesn't speak English, history and today's issues reported interpreted by family at bedside. Alert upon arrival.

## 2016-03-20 NOTE — ED Provider Notes (Signed)
[facility] Provider Note   CSN: [id number] Arrival date & time: 03/20/16  1251     History   Chief Complaint Chief Complaint  Patient presents with  . Fall  . Hypertension    HPI Jo Larson is a 82 y.o. female.  Patient is an 82 year old female from [place]. She presents for evaluation of elevated blood pressure. She apparently fell this morning, however denies any specific injury. According to her family member who is present at bedside, her blood pressure has been running high and her blood sugars have also been higher than normal. The patient denies any chest pain or difficulty breathing. She denies any specific complaints.  History was taken with the assistance of the family member who is present at bedside and acts as Nurse, learning disability.   The history is provided by the patient.  Fall  This is a new problem. The current episode started 6 to 12 hours ago. The problem occurs constantly. The problem has not changed since onset.Nothing aggravates the symptoms. Nothing relieves the symptoms. She has tried nothing for the symptoms.  Hypertension     Past Medical History:  Diagnosis Date  . Abnormal chest x-ray   . Angioedema    a. 05/2014 - felt to be 2/2 clindamycin therapy.  . Anxiety   . CAD (coronary artery disease)    a. 07/2014 NSTEMI/PCI: LM nl, LAD min irregs, LCX 22m (4.0x18 Xience DES), LPDA 90 (2mm vessel->med Rx), RCA nl, EF 60%.  . Diabetes mellitus without complication (HCC)   . Hyperlipemia   . Hypertension   . Hypothyroidism     Patient Active Problem List   Diagnosis Date Noted  . Chest pain, atypical 04/27/2015  . CAD S/P percutaneous coronary angioplasty 07/27/2014  . Abnormal chest x-ray 07/27/2014  . History of non-ST elevation myocardial infarction (NSTEMI) 07/24/2014  . Hypokalemia 07/24/2014  . Angioedema 04/03/2013  . Acute respiratory failure (HCC) 04/03/2013  . POSITIVE PPD 05/17/2010  . URINALYSIS, ABNORMAL 04/24/2010  . SYSTOLIC  MURMUR 40/98/1191  . Hypothyroidism 01/05/2008  . Hyperlipidemia 03/03/2007  . ANXIETY DISORDER, GENERALIZED 03/03/2007  . INSOMNIA, PERSISTENT 03/03/2007  . OSTEOARTHRITIS 03/03/2007  . DEFICIENCY, VITAMIN D NOS 08/05/2006  . Type II diabetes mellitus (HCC) 06/04/1997  . HYPERTENSION, BENIGN ESSENTIAL 07/23/1981    Past Surgical History:  Procedure Laterality Date  . INTUBATION NASOTRACHEAL N/A 04/03/2013   Procedure: FIBEROPTIC NASAL-TRACHEAL INTUBATION;  Surgeon: Christia Reading, MD;  Location: [facility];  Service: ENT;  Laterality: N/A;  . LEFT HEART CATHETERIZATION WITH CORONARY ANGIOGRAM N/A 07/26/2014   Procedure: LEFT HEART CATHETERIZATION WITH CORONARY ANGIOGRAM;  Surgeon: Runell Gess, MD;  Location: [facility] CATH LAB;  Service: Cardiovascular;  Laterality: N/A;    OB History    No data available       Home Medications    Prior to Admission medications   Medication Sig Start Date End Date Taking? Authorizing Provider  aspirin EC 81 MG tablet Take 1 tablet (81 mg total) by mouth once. Patient taking differently: Take 81 mg by mouth daily.  07/27/14  Yes Ok Anis, NP  atorvastatin (LIPITOR) 80 MG tablet Take 1 tablet (80 mg total) by mouth daily at 6 PM. 07/27/14  Yes Ok Anis, NP  cephALEXin (KEFLEX) 500 MG capsule Take 1 capsule (500 mg total) by mouth 3 (three) times daily. 03/13/16  Yes Cathren Laine, MD  cholecalciferol (VITAMIN D) 1000 UNITS tablet Take 1,000 Units by mouth daily.   Yes Historical Provider, MD  cloNIDine (CATAPRES) 0.1 MG tablet Take 0.1 mg by mouth at bedtime.   Yes Historical Provider, MD  cyproheptadine (PERIACTIN) 4 MG tablet Take 4 mg by mouth daily.   Yes Historical Provider, MD  EPINEPHrine (EPI-PEN) 0.3 mg/0.3 mL SOAJ injection Inject 0.3 mLs (0.3 mg total) into the muscle as needed (for resp distress from angioedema). 06/05/13  Yes Simonne MartinetPeter E Babcock, NP  esomeprazole (NEXIUM) 40 MG capsule Take 40 mg by mouth daily before breakfast.    Yes Historical Provider, MD  ibuprofen (ADVIL,MOTRIN) 200 MG tablet Take 200 mg by mouth at bedtime.   Yes Historical Provider, MD  levothyroxine (SYNTHROID, LEVOTHROID) 50 MCG tablet Take 50 mcg by mouth daily before breakfast.   Yes Historical Provider, MD  LORazepam (ATIVAN) 1 MG tablet Take 1 mg by mouth 2 (two) times daily as needed.    Yes Historical Provider, MD  losartan (COZAAR) 100 MG tablet Take 1 tablet (100 mg total) by mouth daily. 05/20/15  Yes Rollene RotundaJames Hochrein, MD  metFORMIN (GLUCOPHAGE) 500 MG tablet Take 1 tablet (500 mg total) by mouth 2 (two) times daily with a meal. Patient taking differently: Take 500 mg by mouth daily after supper.  07/27/14  Yes Ok Anishristopher R Berge, NP  metoprolol tartrate (LOPRESSOR) 25 MG tablet Take 1 tablet (25 mg total) by mouth 2 (two) times daily. Patient taking differently: Take 25 mg by mouth daily.  04/28/15  Yes Jana HalfNicholas A Taylor, MD  nitroGLYCERIN (NITROSTAT) 0.4 MG SL tablet Place 1 tablet (0.4 mg total) under the tongue every 5 (five) minutes x 3 doses as needed for chest pain. 07/27/14  Yes Ok Anishristopher R Berge, NP  polyvinyl alcohol (LIQUIFILM TEARS) 1.4 % ophthalmic solution Place 1 drop into both eyes as needed for dry eyes.   Yes Historical Provider, MD  ticagrelor (BRILINTA) 90 MG TABS tablet Take 1 tablet (90 mg total) by mouth 2 (two) times daily. 07/27/14  Yes Ok Anishristopher R Berge, NP    Family History Family History  Problem Relation Age of Onset  . Hypertension Father   . Diabetes Father     Social History Social History  Substance Use Topics  . Smoking status: Never Smoker  . Smokeless tobacco: Never Used  . Alcohol use No     Allergies   Clindamycin/lincomycin; Peanut butter flavor; and Peanuts [peanut oil]   Review of Systems Review of Systems  All other systems reviewed and are negative.    Physical Exam Updated Vital Signs BP (!) 228/81 (BP Location: Left Arm)   Pulse (!) 58   Temp 97.6 F (36.4 C) (Oral)   Resp  18   Ht 5' (1.524 m)   Wt 175 lb (79.4 kg)   SpO2 93%   BMI 34.18 kg/m   Physical Exam  Constitutional: She is oriented to person, place, and time. She appears well-developed and well-nourished. No distress.  HENT:  Head: Normocephalic and atraumatic.  Mouth/Throat: Oropharynx is clear and moist.  Eyes: EOM are normal. Pupils are equal, round, and reactive to light.  Neck: Normal range of motion. Neck supple.  Cardiovascular: Normal rate and regular rhythm.  Exam reveals no gallop and no friction rub.   No murmur heard. Pulmonary/Chest: Effort normal and breath sounds normal. No respiratory distress. She has no wheezes.  Abdominal: Soft. Bowel sounds are normal. She exhibits no distension. There is no tenderness.  Musculoskeletal: Normal range of motion.  Neurological: She is alert and oriented to person, place, and time. No cranial nerve  deficit. Coordination normal.  Skin: Skin is warm and dry. She is not diaphoretic.  Nursing note and vitals reviewed.    ED Treatments / Results  Labs (all labs ordered are listed, but only abnormal results are displayed) Labs Reviewed  CBC WITH DIFFERENTIAL/PLATELET  BASIC METABOLIC PANEL  I-STAT TROPOININ, ED    EKG  EKG Interpretation  Date/Time:  Tuesday March 20 2016 13:28:29 EDT Ventricular Rate:  58 PR Interval:    QRS Duration: 90 QT Interval:  418 QTC Calculation: 411 R Axis:   5 Text Interpretation:  Sinus rhythm Abnormal T, consider ischemia, diffuse leads Confirmed by Zaid Tomes  MD, Keller Mikels ([id number]) on 03/20/2016 3:15:41 PM       Radiology No results found.  Procedures Procedures (including critical care time)  Medications Ordered in ED Medications  labetalol (NORMODYNE) tablet 100 mg (not administered)     Initial Impression / Assessment and Plan / ED Course  I have reviewed the triage vital signs and the nursing notes.  Pertinent labs & imaging results that were available during my care of the patient were  reviewed by me and considered in my medical decision making (see chart for details).  Clinical Course    Patient brought by family member for evaluation of elevated blood pressure, elevated sugar, and a fall that occurred earlier today. She appears uninjured and I see no indication for x-rays. Her blood pressure was initially elevated, however improved with time and Normodyne. Her laboratory studies reveal no evidence for endorgan damage. I will make adjustments to her blood pressure medications, her diabetes medications, and have her follow-up with her primary Dr. as scheduled in the upcoming weeks.  Final Clinical Impressions(s) / ED Diagnoses   Final diagnoses:  None    New Prescriptions New Prescriptions   No medications on file     Geoffery Lyons, MD 03/20/16 1640

## 2016-04-21 ENCOUNTER — Other Ambulatory Visit: Payer: Self-pay | Admitting: Cardiology

## 2016-04-23 ENCOUNTER — Encounter (HOSPITAL_COMMUNITY): Payer: Self-pay | Admitting: Emergency Medicine

## 2016-04-23 ENCOUNTER — Other Ambulatory Visit: Payer: Self-pay | Admitting: Cardiology

## 2016-04-23 ENCOUNTER — Ambulatory Visit (HOSPITAL_COMMUNITY)
Admission: EM | Admit: 2016-04-23 | Discharge: 2016-04-23 | Disposition: A | Discharge status: Home / Self Care | Payer: Medicare Other | Attending: Internal Medicine | Admitting: Internal Medicine

## 2016-04-23 DIAGNOSIS — I10 Essential (primary) hypertension: Secondary | ICD-10-CM | POA: Insufficient documentation

## 2016-04-23 DIAGNOSIS — E119 Type 2 diabetes mellitus without complications: Secondary | ICD-10-CM | POA: Diagnosis not present

## 2016-04-23 LAB — CBC
HCT: 36.7 % (ref 36.0–46.0)
Hemoglobin: 11.5 g/dL — ABNORMAL LOW (ref 12.0–15.0)
MCH: 28.9 pg (ref 26.0–34.0)
MCHC: 31.3 g/dL (ref 30.0–36.0)
MCV: 92.2 fL (ref 78.0–100.0)
Platelets: 267 10*3/uL (ref 150–400)
RBC: 3.98 MIL/uL (ref 3.87–5.11)
RDW: 14.1 % (ref 11.5–15.5)
WBC: 7.6 10*3/uL (ref 4.0–10.5)

## 2016-04-23 LAB — GLUCOSE, CAPILLARY: GLUCOSE-CAPILLARY: 161 mg/dL — AB (ref 65–99)

## 2016-04-23 MED ORDER — ESOMEPRAZOLE MAGNESIUM 40 MG PO CPDR: 40.0000 mg | DELAYED_RELEASE_CAPSULE | Freq: Every day | ORAL | 3 refills | Status: DC

## 2016-04-23 MED ORDER — LOSARTAN POTASSIUM 100 MG PO TABS: 100.0000 mg | ORAL_TABLET | Freq: Every day | ORAL | 3 refills | Status: DC

## 2016-04-23 MED ORDER — LORAZEPAM 1 MG PO TABS: 1.0000 mg | ORAL_TABLET | Freq: Two times a day (BID) | ORAL | 1 refills | Status: DC | PRN

## 2016-04-23 MED ORDER — MIRTAZAPINE 15 MG PO TABS: 15.0000 mg | ORAL_TABLET | Freq: Every day | ORAL | 3 refills | Status: DC

## 2016-04-23 MED ORDER — GLIMEPIRIDE 4 MG PO TABS: 4.0000 mg | ORAL_TABLET | Freq: Every day | ORAL | 1 refills | Status: DC

## 2016-04-23 MED ORDER — CLONIDINE HCL 0.1 MG PO TABS: 0.1000 mg | ORAL_TABLET | Freq: Every day | ORAL | 3 refills | Status: DC

## 2016-04-23 MED ORDER — TICAGRELOR 90 MG PO TABS: 90.0000 mg | ORAL_TABLET | Freq: Two times a day (BID) | ORAL | 6 refills | Status: DC

## 2016-04-23 MED ORDER — METOPROLOL TARTRATE 25 MG PO TABS: 25.0000 mg | ORAL_TABLET | Freq: Every day | ORAL | 3 refills | Status: DC

## 2016-04-23 NOTE — Telephone Encounter (Signed)
Rx request sent to pharmacy.  

## 2016-04-23 NOTE — ED Triage Notes (Signed)
[facility] for a follow-up on elevated Blood Pressure and Sugar.

## 2016-07-12 NOTE — ED Provider Notes (Signed)
[facility]    CSN: [id number] Arrival date & time: 04/23/16  1437     History   Chief Complaint Chief Complaint  Patient presents with  . Follow-up    HPI Jo Larson is a 82 y.o. female.   Patient presents to clinic for medication refill.  No acute complaints.      Past Medical History:  Diagnosis Date  . Abnormal chest x-ray   . Angioedema    a. 05/2014 - felt to be 2/2 clindamycin therapy.  . Anxiety   . CAD (coronary artery disease)    a. 07/2014 NSTEMI/PCI: LM nl, LAD min irregs, LCX 5357m (4.0x18 Xience DES), LPDA 90 (2mm vessel->med Rx), RCA nl, EF 60%.  . Diabetes mellitus without complication (HCC)   . Hyperlipemia   . Hypertension   . Hypothyroidism     Patient Active Problem List   Diagnosis Date Noted  . Chest pain, atypical 04/27/2015  . CAD S/P percutaneous coronary angioplasty 07/27/2014  . Abnormal chest x-ray 07/27/2014  . History of non-ST elevation myocardial infarction (NSTEMI) 07/24/2014  . Hypokalemia 07/24/2014  . Angioedema 04/03/2013  . Acute respiratory failure (HCC) 04/03/2013  . POSITIVE PPD 05/17/2010  . URINALYSIS, ABNORMAL 04/24/2010  . SYSTOLIC MURMUR 09/13/2009  . Hypothyroidism 01/05/2008  . Hyperlipidemia 03/03/2007  . ANXIETY DISORDER, GENERALIZED 03/03/2007  . INSOMNIA, PERSISTENT 03/03/2007  . OSTEOARTHRITIS 03/03/2007  . DEFICIENCY, VITAMIN D NOS 08/05/2006  . Type II diabetes mellitus (HCC) 06/04/1997  . HYPERTENSION, BENIGN ESSENTIAL 07/23/1981    Past Surgical History:  Procedure Laterality Date  . INTUBATION NASOTRACHEAL N/A 04/03/2013   Procedure: FIBEROPTIC NASAL-TRACHEAL INTUBATION;  Surgeon: Christia Readingwight Bates, MD;  Location: [facility];  Service: ENT;  Laterality: N/A;  . LEFT HEART CATHETERIZATION WITH CORONARY ANGIOGRAM N/A 07/26/2014   Procedure: LEFT HEART CATHETERIZATION WITH CORONARY ANGIOGRAM;  Surgeon: Runell GessJonathan J Berry, MD;  [facility];  Service: Cardiovascular;  Laterality: N/A;     OB History    No data available       Home Medications    Prior to Admission medications   Medication Sig Start Date End Date Taking? Authorizing Provider  aspirin EC 81 MG tablet Take 1 tablet (81 mg total) by mouth once. Patient taking differently: Take 81 mg by mouth daily.  07/27/14   Ok Anishristopher R Berge, NP  atorvastatin (LIPITOR) 80 MG tablet Take 1 tablet (80 mg total) by mouth daily at 6 PM. 07/27/14   Ok Anishristopher R Berge, NP  cholecalciferol (VITAMIN D) 1000 UNITS tablet Take 1,000 Units by mouth daily.    Historical Provider, MD  cloNIDine (CATAPRES) 0.1 MG tablet Take 1 tablet (0.1 mg total) by mouth at bedtime. 04/23/16   Arnaldo NatalMichael S Diamond, MD  EPINEPHrine (EPI-PEN) 0.3 mg/0.3 mL SOAJ injection Inject 0.3 mLs (0.3 mg total) into the muscle as needed (for resp distress from angioedema). 06/05/13   Simonne MartinetPeter E Babcock, NP  esomeprazole (NEXIUM) 40 MG capsule Take 1 capsule (40 mg total) by mouth daily before breakfast. 04/23/16   Arnaldo NatalMichael S Diamond, MD  glimepiride (AMARYL) 4 MG tablet Take 1 tablet (4 mg total) by mouth daily with breakfast. 04/23/16   Arnaldo NatalMichael S Diamond, MD  ibuprofen (ADVIL,MOTRIN) 200 MG tablet Take 200 mg by mouth at bedtime.    Historical Provider, MD  levothyroxine (SYNTHROID, LEVOTHROID) 50 MCG tablet Take 50 mcg by mouth daily before breakfast.    Historical Provider, MD  LORazepam (ATIVAN) 1 MG tablet Take 1 tablet (1 mg  total) by mouth 2 (two) times daily as needed. 04/23/16   Arnaldo Natal, MD  losartan (COZAAR) 100 MG tablet Take 1 tablet (100 mg total) by mouth daily. 04/23/16   Arnaldo Natal, MD  metFORMIN (GLUCOPHAGE) 500 MG tablet Take 1 tablet (500 mg total) by mouth 2 (two) times daily with a meal. Patient taking differently: Take 500 mg by mouth daily after supper.  07/27/14   Ok Anis, NP  metoprolol tartrate (LOPRESSOR) 25 MG tablet Take 1 tablet (25 mg total) by mouth daily. 04/23/16   Arnaldo Natal, MD  mirtazapine (REMERON) 15 MG  tablet Take 1 tablet (15 mg total) by mouth at bedtime. 04/23/16   Arnaldo Natal, MD  nitroGLYCERIN (NITROSTAT) 0.4 MG SL tablet Place 1 tablet (0.4 mg total) under the tongue every 5 (five) minutes x 3 doses as needed for chest pain. 07/27/14   Ok Anis, NP  polyvinyl alcohol (LIQUIFILM TEARS) 1.4 % ophthalmic solution Place 1 drop into both eyes as needed for dry eyes.    Historical Provider, MD  ticagrelor (BRILINTA) 90 MG TABS tablet Take 1 tablet (90 mg total) by mouth 2 (two) times daily. 04/23/16   Arnaldo Natal, MD    Family History Family History  Problem Relation Age of Onset  . Hypertension Father   . Diabetes Father     Social History Social History  Substance Use Topics  . Smoking status: Never Smoker  . Smokeless tobacco: Never Used  . Alcohol use No     Allergies   Clindamycin/lincomycin; Peanut butter flavor; and Peanuts [peanut oil]   Review of Systems Review of Systems  Constitutional: Negative for chills and fever.  HENT: Negative for sore throat and tinnitus.   Eyes: Negative for redness.  Respiratory: Negative for cough and shortness of breath.   Cardiovascular: Negative for chest pain and palpitations.  Gastrointestinal: Negative for abdominal pain, diarrhea, nausea and vomiting.  Genitourinary: Negative for dysuria, frequency and urgency.  Musculoskeletal: Negative for myalgias.  Skin: Negative for rash.       No lesions  Neurological: Negative for weakness.  Hematological: Does not bruise/bleed easily.  Psychiatric/Behavioral: Negative for suicidal ideas.     Physical Exam Triage Vital Signs ED Triage Vitals  Enc Vitals Group     BP 04/23/16 1505 145/72     Pulse Rate 04/23/16 1505 64     Resp 04/23/16 1505 14     Temp 04/23/16 1505 98.3 F (36.8 C)     Temp Source 04/23/16 1505 Oral     SpO2 04/23/16 1505 100 %     Weight --      Height --      Head Circumference --      Peak Flow --      Pain Score 04/23/16 1702 0      Pain Loc --      Pain Edu? --      Excl. in GC? --    No data found.   Updated Vital Signs BP 145/72 (BP Location: Left Arm)   Pulse 64   Temp 98.3 F (36.8 C) (Oral)   Resp 14   SpO2 100%   Visual Acuity Right Eye Distance:   Left Eye Distance:   Bilateral Distance:    Right Eye Near:   Left Eye Near:    Bilateral Near:     Physical Exam  Constitutional: She is oriented to person, place, and time. She appears well-developed  and well-nourished. No distress.  HENT:  Head: Normocephalic and atraumatic.  Mouth/Throat: Oropharynx is clear and moist.  Eyes: Conjunctivae and EOM are normal. Pupils are equal, round, and reactive to light. No scleral icterus.  Neck: Normal range of motion. Neck supple. No JVD present. No tracheal deviation present. No thyromegaly present.  Cardiovascular: Normal rate, regular rhythm and normal heart sounds.  Exam reveals no gallop and no friction rub.   No murmur heard. Pulmonary/Chest: Effort normal and breath sounds normal.  Abdominal: Soft. Bowel sounds are normal. She exhibits no distension. There is no tenderness.  Musculoskeletal: Normal range of motion. She exhibits no edema.  Lymphadenopathy:    She has no cervical adenopathy.  Neurological: She is alert and oriented to person, place, and time. No cranial nerve deficit.  Skin: Skin is warm and dry.  Psychiatric: She has a normal mood and affect. Her behavior is normal. Judgment and thought content normal.  Nursing note and vitals reviewed.    UC Treatments / Results  Labs (all labs ordered are listed, but only abnormal results are displayed) Labs Reviewed  CBC - Abnormal; Notable for the following:       Result Value   Hemoglobin 11.5 (*)    All other components within normal limits  GLUCOSE, CAPILLARY - Abnormal; Notable for the following:    Glucose-Capillary 161 (*)    All other components within normal limits    EKG  EKG Interpretation None       Radiology No  results found.  Procedures Procedures (including critical care time)  Medications Ordered in UC Medications - No data to display   Initial Impression / Assessment and Plan / UC Course  I have reviewed the triage vital signs and the nursing notes.  Pertinent labs & imaging results that were available during my care of the patient were reviewed by me and considered in my medical decision making (see chart for details).  Clinical Course     Meds refilled.  Encouraged follow-up with PMD.  Final Clinical Impressions(s) / UC Diagnoses   Final diagnoses:  Essential hypertension  Type 2 diabetes mellitus without complication, without long-term current use of insulin (HCC)    New Prescriptions Discharge Medication List as of 04/23/2016  4:53 PM    START taking these medications   Details  glimepiride (AMARYL) 4 MG tablet Take 1 tablet (4 mg total) by mouth daily with breakfast., Starting Mon 04/23/2016, Normal    mirtazapine (REMERON) 15 MG tablet Take 1 tablet (15 mg total) by mouth at bedtime., Starting Mon 04/23/2016, Normal         Arnaldo NatalMichael S Diamond, MD 07/12/16 [phone]

## 2016-07-21 ENCOUNTER — Other Ambulatory Visit: Payer: Self-pay | Admitting: Cardiology

## 2016-07-30 ENCOUNTER — Ambulatory Visit (HOSPITAL_COMMUNITY)
Admission: EM | Admit: 2016-07-30 | Discharge: 2016-07-30 | Disposition: A | Discharge status: Home / Self Care | Payer: Medicare Other | Attending: Emergency Medicine | Admitting: Emergency Medicine

## 2016-07-30 ENCOUNTER — Encounter (HOSPITAL_COMMUNITY): Payer: Self-pay | Admitting: *Deleted

## 2016-07-30 DIAGNOSIS — I10 Essential (primary) hypertension: Secondary | ICD-10-CM | POA: Diagnosis not present

## 2016-07-30 DIAGNOSIS — F419 Anxiety disorder, unspecified: Secondary | ICD-10-CM | POA: Diagnosis not present

## 2016-07-30 DIAGNOSIS — Z760 Encounter for issue of repeat prescription: Secondary | ICD-10-CM

## 2016-07-30 DIAGNOSIS — E119 Type 2 diabetes mellitus without complications: Secondary | ICD-10-CM | POA: Diagnosis not present

## 2016-07-30 LAB — POCT I-STAT, CHEM 8
BUN: 21 mg/dL — ABNORMAL HIGH (ref 6–20)
CREATININE: 1 mg/dL (ref 0.44–1.00)
Calcium, Ion: 1.19 mmol/L (ref 1.15–1.40)
Chloride: 112 mmol/L — ABNORMAL HIGH (ref 101–111)
GLUCOSE: 166 mg/dL — AB (ref 65–99)
HCT: 33 % — ABNORMAL LOW (ref 36.0–46.0)
Hemoglobin: 11.2 g/dL — ABNORMAL LOW (ref 12.0–15.0)
POTASSIUM: 4.1 mmol/L (ref 3.5–5.1)
Sodium: 142 mmol/L (ref 135–145)
TCO2: 19 mmol/L (ref 0–100)

## 2016-07-30 MED ORDER — METFORMIN HCL 500 MG PO TABS: ORAL_TABLET | ORAL | 0 refills | Status: DC

## 2016-07-30 MED ORDER — LORAZEPAM 1 MG PO TABS: 1.0000 mg | ORAL_TABLET | Freq: Two times a day (BID) | ORAL | 0 refills | Status: DC

## 2016-07-30 NOTE — Discharge Instructions (Addendum)
There is a risk of taking medications prescribed by a health care provider without obtaining a complete history, labwork or proper physical exam, etc. This cannot be completed adequately at an urgent care. There can be multiple problems, some serious,  associated with medications and undetermined conditions of your health status. This action is performed as a last resort in order to supply you with medication. By receiving these prescriptions you are ackowleging and accepting these risks and will not hold the prescriber or any agent of [facility] as responsible for any adverse outcomes.   Included in your instructions are 2 telephone numbers that you may call to obtain information about primary care providers. Please call these telephone numbers to establish with a doctor or primary care provider as soon as possible.

## 2016-07-30 NOTE — ED Provider Notes (Signed)
CSN: [id number]     Arrival date & time 07/30/16  1002 History   First MD Initiated Contact with Patient 07/30/16 1038     Chief Complaint  Patient presents with  . Medication Refill   (Consider location/radiation/quality/duration/timing/severity/associated sxs/prior Treatment) 83 year old female emigrant from another country and does not [place] and is accompanied by her significant other who does. She has a history of CAD, diabetes mellitus, hypertension, hyperlipidemia and hypothyroidism. She states that she no longer has a PCP now for several months and has been going to the emergency department in urgent cares for her refills. Her significant other states that she works and does not have time to help her find a PCP. More specifically she is here for refill of metformin and lorazepam. She has no other complaints. Her significant other states she checks her blood sugars regularly and are in the 180s and sometimes higher.      Past Medical History:  Diagnosis Date  . Abnormal chest x-ray   . Angioedema    a. 05/2014 - felt to be 2/2 clindamycin therapy.  . Anxiety   . CAD (coronary artery disease)    a. 07/2014 NSTEMI/PCI: LM nl, LAD min irregs, LCX 87m (4.0x18 Xience DES), LPDA 90 (2mm vessel->med Rx), RCA nl, EF 60%.  . Diabetes mellitus without complication (HCC)   . Hyperlipemia   . Hypertension   . Hypothyroidism    Past Surgical History:  Procedure Laterality Date  . INTUBATION NASOTRACHEAL N/A 04/03/2013   Procedure: FIBEROPTIC NASAL-TRACHEAL INTUBATION;  Surgeon: Christia Reading, MD;  Location: [facility];  Service: ENT;  Laterality: N/A;  . LEFT HEART CATHETERIZATION WITH CORONARY ANGIOGRAM N/A 07/26/2014   Procedure: LEFT HEART CATHETERIZATION WITH CORONARY ANGIOGRAM;  Surgeon: Runell Gess, MD;  Location: [facility] CATH LAB;  Service: Cardiovascular;  Laterality: N/A;   Family History  Problem Relation Age of Onset  . Hypertension Father   . Diabetes Father    Social History   Substance Use Topics  . Smoking status: Never Smoker  . Smokeless tobacco: Never Used  . Alcohol use No   OB History    No data available     Review of Systems  Constitutional: Negative.   Respiratory: Negative for cough and shortness of breath.   Cardiovascular: Negative for chest pain and leg swelling.  Gastrointestinal: Negative.   Genitourinary: Negative.   Neurological: Negative.   Psychiatric/Behavioral: The patient is nervous/anxious.   All other systems reviewed and are negative.   Allergies  Clindamycin/lincomycin; Peanut butter flavor; and Peanuts [peanut oil]  Home Medications   Prior to Admission medications   Medication Sig Start Date End Date Taking? Authorizing Provider  aspirin EC 81 MG tablet Take 1 tablet (81 mg total) by mouth once. Patient taking differently: Take 81 mg by mouth daily.  07/27/14   Ok Anis, NP  atorvastatin (LIPITOR) 80 MG tablet Take 1 tablet (80 mg total) by mouth daily at 6 PM. 07/27/14   Ok Anis, NP  cholecalciferol (VITAMIN D) 1000 UNITS tablet Take 1,000 Units by mouth daily.    Historical Provider, MD  cloNIDine (CATAPRES) 0.1 MG tablet Take 1 tablet (0.1 mg total) by mouth at bedtime. 04/23/16   Arnaldo Natal, MD  EPINEPHrine (EPI-PEN) 0.3 mg/0.3 mL SOAJ injection Inject 0.3 mLs (0.3 mg total) into the muscle as needed (for resp distress from angioedema). 06/05/13   Simonne Martinet, NP  esomeprazole (NEXIUM) 40 MG capsule Take 1 capsule (40 mg total)  by mouth daily before breakfast. 04/23/16   Arnaldo Natal, MD  glimepiride (AMARYL) 4 MG tablet Take 1 tablet (4 mg total) by mouth daily with breakfast. 04/23/16   Arnaldo Natal, MD  ibuprofen (ADVIL,MOTRIN) 200 MG tablet Take 200 mg by mouth at bedtime.    Historical Provider, MD  levothyroxine (SYNTHROID, LEVOTHROID) 50 MCG tablet Take 50 mcg by mouth daily before breakfast.    Historical Provider, MD  LORazepam (ATIVAN) 1 MG tablet Take 1 tablet (1 mg  total) by mouth 2 (two) times daily. Prn nervousness 07/30/16   Hayden Rasmussen, NP  losartan (COZAAR) 100 MG tablet TAKE 1 TABLET(100 MG) BY MOUTH DAILY 07/24/16   Rollene Rotunda, MD  metFORMIN (GLUCOPHAGE) 500 MG tablet 1 tab bid with meals 07/30/16   Hayden Rasmussen, NP  metoprolol tartrate (LOPRESSOR) 25 MG tablet Take 1 tablet (25 mg total) by mouth daily. 04/23/16   Arnaldo Natal, MD  mirtazapine (REMERON) 15 MG tablet Take 1 tablet (15 mg total) by mouth at bedtime. 04/23/16   Arnaldo Natal, MD  nitroGLYCERIN (NITROSTAT) 0.4 MG SL tablet Place 1 tablet (0.4 mg total) under the tongue every 5 (five) minutes x 3 doses as needed for chest pain. 07/27/14   Ok Anis, NP  polyvinyl alcohol (LIQUIFILM TEARS) 1.4 % ophthalmic solution Place 1 drop into both eyes as needed for dry eyes.    Historical Provider, MD  ticagrelor (BRILINTA) 90 MG TABS tablet Take 1 tablet (90 mg total) by mouth 2 (two) times daily. 04/23/16   Arnaldo Natal, MD   Meds Ordered and Administered this Visit  Medications - No data to display  BP 182/78 (BP Location: Right Arm)   Pulse 80   Temp 98.6 F (37 C) (Oral)   Resp 18   SpO2 100%  No data found.   Physical Exam  Constitutional: She appears well-developed and well-nourished. No distress.  Eyes: EOM are normal.  Neck: Neck supple.  Cardiovascular: Normal rate, regular rhythm and normal heart sounds.   Pulmonary/Chest: Breath sounds normal. She is in respiratory distress.  Musculoskeletal: She exhibits no edema.  Neurological: She is alert.  Skin: Skin is warm and dry.  Nursing note and vitals reviewed.   Urgent Care Course   Clinical Course     Procedures (including critical care time)  Labs Review Labs Reviewed  POCT I-STAT, CHEM 8 - Abnormal; Notable for the following:       Result Value   Chloride 112 (*)    BUN 21 (*)    Glucose, Bld 166 (*)    Hemoglobin 11.2 (*)    HCT 33.0 (*)    All other components within normal limits   Results  for orders placed or performed during the hospital encounter of 07/30/16  I-STAT, chem 8  Result Value Ref Range   Sodium 142 135 - 145 mmol/L   Potassium 4.1 3.5 - 5.1 mmol/L   Chloride 112 (H) 101 - 111 mmol/L   BUN 21 (H) 6 - 20 mg/dL   Creatinine, Ser 8.11 0.44 - 1.00 mg/dL   Glucose, Bld 914 (H) 65 - 99 mg/dL   Calcium, Ion 7.82 9.56 - 1.40 mmol/L   TCO2 19 0 - 100 mmol/L   Hemoglobin 11.2 (L) 12.0 - 15.0 g/dL   HCT 21.3 (L) 08.6 - 57.8 %     Imaging Review No results found.   Visual Acuity Review  Right Eye Distance:   Left Eye  Distance:   Bilateral Distance:    Right Eye Near:   Left Eye Near:    Bilateral Near:         MDM   1. Encounter for medication refill   2. Type 2 diabetes mellitus without complication, without long-term current use of insulin (HCC)   3. Anxiety   4. Essential hypertension    There is a risk of taking medications prescribed by a health care provider without obtaining a complete history, labwork or proper physical exam, etc. This cannot be completed adequately at an urgent care. There can be multiple problems, some serious,  associated with medications and undetermined conditions of your health status. This action is performed as a last resort in order to supply you with medication. By receiving these prescriptions you are ackowleging and accepting these risks and will not hold the prescriber or any agent of [facility] as responsible for any adverse outcomes.   Included in your instructions are 2 telephone numbers that you may call to obtain information about primary care providers. Please call these telephone numbers to establish with a doctor or primary care provider as soon as possible. Meds ordered this encounter  Medications  . metFORMIN (GLUCOPHAGE) 500 MG tablet    Sig: 1 tab bid with meals    Dispense:  60 tablet    Refill:  0    Order Specific Question:   Supervising Provider    Answer:    Charm RingsHONIG, ERIN J [id number]]  . LORazepam (ATIVAN) 1 MG tablet    Sig: Take 1 tablet (1 mg total) by mouth 2 (two) times daily. Prn nervousness    Dispense:  30 tablet    Refill:  0    Order Specific Question:   Supervising Provider    Answer:   Micheline ChapmanHONIG, ERIN J [[id number]]       Hayden Rasmussenavid Ramsha Lonigro, NP 07/30/16 1204

## 2016-07-30 NOTE — ED Triage Notes (Signed)
Pt  Is out  Of  Some  Of  Her  meds   X  1  Week   She is  Requesting  Refills  Of  Lorazepam  And  Metformin   pcp  No longer  Practices

## 2016-08-20 ENCOUNTER — Encounter (HOSPITAL_COMMUNITY): Payer: Self-pay | Admitting: Emergency Medicine

## 2016-08-20 ENCOUNTER — Ambulatory Visit (HOSPITAL_COMMUNITY)
Admission: EM | Admit: 2016-08-20 | Discharge: 2016-08-20 | Disposition: A | Discharge status: Home / Self Care | Payer: Medicare Other | Attending: Emergency Medicine | Admitting: Emergency Medicine

## 2016-08-20 DIAGNOSIS — F419 Anxiety disorder, unspecified: Secondary | ICD-10-CM | POA: Diagnosis not present

## 2016-08-20 DIAGNOSIS — E118 Type 2 diabetes mellitus with unspecified complications: Secondary | ICD-10-CM | POA: Diagnosis not present

## 2016-08-20 DIAGNOSIS — I10 Essential (primary) hypertension: Secondary | ICD-10-CM

## 2016-08-20 DIAGNOSIS — Z760 Encounter for issue of repeat prescription: Secondary | ICD-10-CM

## 2016-08-20 MED ORDER — METOPROLOL TARTRATE 25 MG PO TABS: 25.0000 mg | ORAL_TABLET | Freq: Every day | ORAL | 2 refills | Status: DC

## 2016-08-20 MED ORDER — ATORVASTATIN CALCIUM 80 MG PO TABS: 80.0000 mg | ORAL_TABLET | Freq: Every day | ORAL | 2 refills | Status: DC

## 2016-08-20 MED ORDER — GLIMEPIRIDE 4 MG PO TABS: 4.0000 mg | ORAL_TABLET | Freq: Every day | ORAL | 2 refills | Status: DC

## 2016-08-20 MED ORDER — TICAGRELOR 90 MG PO TABS: 90.0000 mg | ORAL_TABLET | Freq: Two times a day (BID) | ORAL | 2 refills | Status: DC

## 2016-08-20 MED ORDER — LORAZEPAM 1 MG PO TABS: 1.0000 mg | ORAL_TABLET | Freq: Two times a day (BID) | ORAL | 0 refills | Status: DC | PRN

## 2016-08-20 MED ORDER — METFORMIN HCL 500 MG PO TABS: ORAL_TABLET | ORAL | 2 refills | Status: DC

## 2016-08-20 MED ORDER — ESOMEPRAZOLE MAGNESIUM 40 MG PO CPDR: 40.0000 mg | DELAYED_RELEASE_CAPSULE | Freq: Every day | ORAL | 2 refills | Status: DC

## 2016-08-20 NOTE — Discharge Instructions (Signed)
Provided a 3 month supply of her medications. I am only able to do 15 tablets of the lorazepam. Please schedule an appointment with Dr. Earnest BaileyBriscoe as soon as possible.  Given her health problems, it is very important that these medications are prescribed and followed by a primary care doctor.

## 2016-08-20 NOTE — ED Triage Notes (Signed)
Here for med refill... Does not have PCP at the moment.   Deen here on 07/30/16 for similar sx.   A&O x4... NAD

## 2016-08-20 NOTE — ED Provider Notes (Signed)
[facility]    CSN: [id number] Arrival date & time: 08/20/16  4098     History   Chief Complaint Chief Complaint  Patient presents with  . Medication Refill    HPI Jo Larson is a 83 y.o. female.   HPI  She is an 82 year old woman here with her daughter-in-law for medication refills. Patient does not speak [place] and daughter-in-law acts as Equities trader. They are in the process of getting a new primary care provider. They need to change the name on her insurance card, but will then be able to set up an appointment with Dr. Delbert Harness. She needs refills of hypertension, hypercholesterol, and diabetic medications. They're also requesting a refill of lorazepam. Patient reports intermittent troubles with chest discomfort, but none recently. No other complaints.  Past Medical History:  Diagnosis Date  . Abnormal chest x-ray   . Angioedema    a. 05/2014 - felt to be 2/2 clindamycin therapy.  . Anxiety   . CAD (coronary artery disease)    a. 07/2014 NSTEMI/PCI: LM nl, LAD min irregs, LCX 42m (4.0x18 Xience DES), LPDA 90 (2mm vessel->med Rx), RCA nl, EF 60%.  . Diabetes mellitus without complication (HCC)   . Hyperlipemia   . Hypertension   . Hypothyroidism     Patient Active Problem List   Diagnosis Date Noted  . Chest pain, atypical 04/27/2015  . CAD S/P percutaneous coronary angioplasty 07/27/2014  . Abnormal chest x-ray 07/27/2014  . History of non-ST elevation myocardial infarction (NSTEMI) 07/24/2014  . Hypokalemia 07/24/2014  . Angioedema 04/03/2013  . Acute respiratory failure (HCC) 04/03/2013  . POSITIVE PPD 05/17/2010  . URINALYSIS, ABNORMAL 04/24/2010  . SYSTOLIC MURMUR 09/13/2009  . Hypothyroidism 01/05/2008  . Hyperlipidemia 03/03/2007  . ANXIETY DISORDER, GENERALIZED 03/03/2007  . INSOMNIA, PERSISTENT 03/03/2007  . OSTEOARTHRITIS 03/03/2007  . DEFICIENCY, VITAMIN D NOS 08/05/2006  . Type II diabetes mellitus (HCC) 06/04/1997  . HYPERTENSION,  BENIGN ESSENTIAL 07/23/1981    Past Surgical History:  Procedure Laterality Date  . INTUBATION NASOTRACHEAL N/A 04/03/2013   Procedure: FIBEROPTIC NASAL-TRACHEAL INTUBATION;  Surgeon: Christia Reading, MD;  Location: [facility];  Service: ENT;  Laterality: N/A;  . LEFT HEART CATHETERIZATION WITH CORONARY ANGIOGRAM N/A 07/26/2014   Procedure: LEFT HEART CATHETERIZATION WITH CORONARY ANGIOGRAM;  Surgeon: Runell Gess, MD;  Location: [facility] CATH LAB;  Service: Cardiovascular;  Laterality: N/A;    OB History    No data available       Home Medications    Prior to Admission medications   Medication Sig Start Date End Date Taking? Authorizing Provider  aspirin EC 81 MG tablet Take 1 tablet (81 mg total) by mouth once. Patient taking differently: Take 81 mg by mouth daily.  07/27/14  Yes Ok Anis, NP  cholecalciferol (VITAMIN D) 1000 UNITS tablet Take 1,000 Units by mouth daily.   Yes Historical Provider, MD  cloNIDine (CATAPRES) 0.1 MG tablet Take 1 tablet (0.1 mg total) by mouth at bedtime. 04/23/16  Yes Arnaldo Natal, MD  ibuprofen (ADVIL,MOTRIN) 200 MG tablet Take 200 mg by mouth at bedtime.   Yes Historical Provider, MD  levothyroxine (SYNTHROID, LEVOTHROID) 50 MCG tablet Take 50 mcg by mouth daily before breakfast.   Yes Historical Provider, MD  losartan (COZAAR) 100 MG tablet TAKE 1 TABLET(100 MG) BY MOUTH DAILY 07/24/16  Yes Rollene Rotunda, MD  mirtazapine (REMERON) 15 MG tablet Take 1 tablet (15 mg total) by mouth at bedtime. 04/23/16  Yes Kelton Pillar  Sheryle Hailiamond, MD  polyvinyl alcohol (LIQUIFILM TEARS) 1.4 % ophthalmic solution Place 1 drop into both eyes as needed for dry eyes.   Yes Historical Provider, MD  atorvastatin (LIPITOR) 80 MG tablet Take 1 tablet (80 mg total) by mouth daily at 6 PM. 08/20/16   Charm RingsErin J Draxton Luu, MD  EPINEPHrine (EPI-PEN) 0.3 mg/0.3 mL SOAJ injection Inject 0.3 mLs (0.3 mg total) into the muscle as needed (for resp distress from angioedema). 06/05/13   Simonne MartinetPeter E  Babcock, NP  esomeprazole (NEXIUM) 40 MG capsule Take 1 capsule (40 mg total) by mouth daily before breakfast. 08/20/16   Charm RingsErin J Francena Zender, MD  glimepiride (AMARYL) 4 MG tablet Take 1 tablet (4 mg total) by mouth daily with breakfast. 08/20/16   Charm RingsErin J Javon Hupfer, MD  LORazepam (ATIVAN) 1 MG tablet Take 1 tablet (1 mg total) by mouth 2 (two) times daily as needed for anxiety. Prn nervousness 08/20/16   Charm RingsErin J Christain Mcraney, MD  metFORMIN (GLUCOPHAGE) 500 MG tablet 1 tab bid with meals 08/20/16   Charm RingsErin J Ivanna Kocak, MD  metoprolol tartrate (LOPRESSOR) 25 MG tablet Take 1 tablet (25 mg total) by mouth daily. 08/20/16   Charm RingsErin J Kyera Felan, MD  nitroGLYCERIN (NITROSTAT) 0.4 MG SL tablet Place 1 tablet (0.4 mg total) under the tongue every 5 (five) minutes x 3 doses as needed for chest pain. 07/27/14   Ok Anishristopher R Berge, NP  ticagrelor (BRILINTA) 90 MG TABS tablet Take 1 tablet (90 mg total) by mouth 2 (two) times daily. 08/20/16   Charm RingsErin J Fantasy Donald, MD    Family History Family History  Problem Relation Age of Onset  . Hypertension Father   . Diabetes Father     Social History Social History  Substance Use Topics  . Smoking status: Never Smoker  . Smokeless tobacco: Never Used  . Alcohol use No     Allergies   Clindamycin/lincomycin; Peanut butter flavor; and Peanuts [peanut oil]   Review of Systems Review of Systems As in history of present illness  Physical Exam Triage Vital Signs ED Triage Vitals  Enc Vitals Group     BP 08/20/16 1010 171/76     Pulse Rate 08/20/16 1010 60     Resp 08/20/16 1010 16     Temp 08/20/16 1010 98.2 F (36.8 C)     Temp Source 08/20/16 1010 Oral     SpO2 08/20/16 1010 97 %     Weight --      Height --      Head Circumference --      Peak Flow --      Pain Score 08/20/16 1008 7     Pain Loc --      Pain Edu? --      Excl. in GC? --    No data found.   Updated Vital Signs BP 171/76 (BP Location: Left Arm)   Pulse 60   Temp 98.2 F (36.8 C) (Oral)   Resp 16   SpO2 97%    Visual Acuity Right Eye Distance:   Left Eye Distance:   Bilateral Distance:    Right Eye Near:   Left Eye Near:    Bilateral Near:     Physical Exam  Constitutional: She is oriented to person, place, and time. She appears well-developed and well-nourished. No distress.  Neck: Neck supple.  Cardiovascular: Normal rate, regular rhythm and normal heart sounds.   No murmur heard. Pulmonary/Chest: Effort normal and breath sounds normal. No respiratory distress. She has  no wheezes. She has no rales.  Musculoskeletal: She exhibits no edema.  Neurological: She is alert and oriented to person, place, and time.     UC Treatments / Results  Labs (all labs ordered are listed, but only abnormal results are displayed) Labs Reviewed - No data to display  EKG  EKG Interpretation None       Radiology No results found.  Procedures Procedures (including critical care time)  Medications Ordered in UC Medications - No data to display   Initial Impression / Assessment and Plan / UC Course  I have reviewed the triage vital signs and the nursing notes.  Pertinent labs & imaging results that were available during my care of the patient were reviewed by me and considered in my medical decision making (see chart for details).     I've provided a 3 month supply of her medications. I did provide 15 tablets of her lorazepam. Again emphasized the importance of setting up an appointment with a new doctor as soon as possible.  Final Clinical Impressions(s) / UC Diagnoses   Final diagnoses:  Medication refill  Type 2 diabetes mellitus with complication, without long-term current use of insulin [facility])  Essential hypertension  Anxiety    New Prescriptions Current Discharge Medication List       Charm Rings, MD 08/20/16 1035

## 2016-09-03 ENCOUNTER — Ambulatory Visit (HOSPITAL_COMMUNITY)
Admission: EM | Admit: 2016-09-03 | Discharge: 2016-09-03 | Disposition: A | Discharge status: Home / Self Care | Payer: Medicare Other | Attending: Family Medicine | Admitting: Family Medicine

## 2016-09-03 ENCOUNTER — Ambulatory Visit (INDEPENDENT_AMBULATORY_CARE_PROVIDER_SITE_OTHER): Payer: Medicare Other

## 2016-09-03 ENCOUNTER — Encounter (HOSPITAL_COMMUNITY): Payer: Self-pay | Admitting: Emergency Medicine

## 2016-09-03 DIAGNOSIS — J209 Acute bronchitis, unspecified: Secondary | ICD-10-CM

## 2016-09-03 DIAGNOSIS — Z760 Encounter for issue of repeat prescription: Secondary | ICD-10-CM

## 2016-09-03 MED ORDER — LORAZEPAM 1 MG PO TABS: 1.0000 mg | ORAL_TABLET | Freq: Three times a day (TID) | ORAL | 0 refills | Status: DC | PRN

## 2016-09-03 MED ORDER — AZITHROMYCIN 250 MG PO TABS: 250.0000 mg | ORAL_TABLET | Freq: Every day | ORAL | 0 refills | Status: DC

## 2016-09-03 NOTE — ED Triage Notes (Signed)
[facility] a [facility] needing a ref[facility] on her Ativan. The patient reported that she does have a new PCP but can not get an appointment until March.  The patient also complained of a cough x 1 week.

## 2016-09-03 NOTE — ED Provider Notes (Signed)
CSN: [id number]     Arrival date & time 09/03/16  1301 History   None    Chief Complaint  Patient presents with  . Medication Refill  . Cough   (Consider location/radiation/quality/duration/timing/severity/associated sxs/prior Treatment) The history is provided by the patient and a relative.  Medication Refill  Medications/supplies requested:  Ativan Patient has complete original prescription information: no   Cough  Cough characteristics:  Productive Sputum characteristics:  Nondescript Severity:  Moderate Onset quality:  Gradual Timing:  Constant Progression:  Worsening Chronicity:  New Smoker: no   Relieved by:  Nothing Worsened by:  Nothing Ineffective treatments:  None tried Associated symptoms: shortness of breath   Risk factors: no recent infection   Pt has a history of long term use of ativan.  Her MD has closed practice Mayford Knife).  No new MD.    Past Medical History:  Diagnosis Date  . Abnormal chest x-ray   . Angioedema    a. 05/2014 - felt to be 2/2 clindamycin therapy.  . Anxiety   . CAD (coronary artery disease)    a. 07/2014 NSTEMI/PCI: LM nl, LAD min irregs, LCX 104m (4.0x18 Xience DES), LPDA 90 (2mm vessel->med Rx), RCA nl, EF 60%.  . Diabetes mellitus without complication (HCC)   . Hyperlipemia   . Hypertension   . Hypothyroidism    Past Surgical History:  Procedure Laterality Date  . INTUBATION NASOTRACHEAL N/A 04/03/2013   Procedure: FIBEROPTIC NASAL-TRACHEAL INTUBATION;  Surgeon: Christia Reading, MD;  Location: [facility];  Service: ENT;  Laterality: N/A;  . LEFT HEART CATHETERIZATION WITH CORONARY ANGIOGRAM N/A 07/26/2014   Procedure: LEFT HEART CATHETERIZATION WITH CORONARY ANGIOGRAM;  Surgeon: Runell Gess, MD;  Location: [facility] CATH LAB;  Service: Cardiovascular;  Laterality: N/A;   Family History  Problem Relation Age of Onset  . Hypertension Father   . Diabetes Father    Social History  Substance Use Topics  . Smoking status: Never Smoker  .  Smokeless tobacco: Never Used  . Alcohol use No   OB History    No data available     Review of Systems  Respiratory: Positive for cough and shortness of breath.   All other systems reviewed and are negative.   Allergies  Clindamycin/lincomycin; Peanut butter flavor; and Peanuts [peanut oil]  Home Medications   Prior to Admission medications   Medication Sig Start Date End Date Taking? Authorizing Provider  azithromycin (ZITHROMAX) 250 MG tablet Take 1 tablet (250 mg total) by mouth daily. Take first 2 tablets together, then 1 every day until finished. 09/03/16   Elson Areas, PA-C  LORazepam (ATIVAN) 1 MG tablet Take 1 tablet (1 mg total) by mouth 3 (three) times daily as needed for anxiety. 09/03/16   Elson Areas, PA-C   Meds Ordered and Administered this Visit  Medications - No data to display  BP 196/75 (BP Location: Right Arm)   Pulse (!) 52   Temp 98.2 F (36.8 C) (Oral)   Resp 18   SpO2 96%  No data found.   Physical Exam  Constitutional: She appears well-developed and well-nourished. No distress.  HENT:  Head: Normocephalic and atraumatic.  Eyes: Conjunctivae are normal.  Neck: Neck supple.  Cardiovascular: Normal rate and regular rhythm.   No murmur heard. Pulmonary/Chest: Effort normal and breath sounds normal. No respiratory distress.  Abdominal: Soft. There is no tenderness.  Musculoskeletal: Normal range of motion. She exhibits no edema.  Neurological: She is alert.  Skin: Skin is  warm and dry.  Psychiatric: She has a normal mood and affect.  Nursing note and vitals reviewed.   Urgent Care Course     Procedures (including critical care time)  Labs Review Labs Reviewed - No data to display  Imaging Review Dg Chest 2 View  Result Date: 09/03/2016 CLINICAL DATA:  Initial evaluation for acute cough for 1 week. EXAM: CHEST  2 VIEW COMPARISON:  Prior radiograph from 04/27/2015. FINDINGS: The mild cardiomegaly, stable. Mediastinal silhouette  within normal limits. Aortic atherosclerosis noted. Lungs mildly hypoinflated. Mild bibasilar subsegmental atelectasis. No focal infiltrates identified. No pulmonary edema or pleural effusion. No pneumothorax. No acute osseous abnormality. Multilevel degenerative changes noted within the visualized spine. IMPRESSION: 1. Shallow lung inflation with mild bibasilar subsegmental atelectasis. No other active cardiopulmonary disease identified. 2. Stable mild cardiomegaly.  No pulmonary edema. 3. Aortic atherosclerosis. Electronically Signed   By: Rise MuBenjamin  McClintock M.D.   On: 09/03/2016 16:36     Visual Acuity Review  Right Eye Distance:   Left Eye Distance:   Bilateral Distance:    Right Eye Near:   Left Eye Near:    Bilateral Near:       DEA sight would not open.  I reviewed MD records from January when MD reviewed.  Pt was receiving from Dr. Mayford KnifeWilliams.   MDM   1. Acute bronchitis, unspecified organism   2. Encounter for medication refill    I am reluctant to continue ativan however I am concerned that pt may suffer withdrawal or seizures.  I will give 5 day supply.  Family advised Urgent care is not appropriate place for management of this medication.   Meds ordered this encounter  Medications  . DISCONTD: azithromycin (ZITHROMAX) 250 MG tablet    Sig: Take 1 tablet (250 mg total) by mouth daily. Take first 2 tablets together, then 1 every day until finished.    Dispense:  6 tablet    Refill:  0    Order Specific Question:   Supervising Provider    Answer:   Linna HoffKINDL, JAMES D [phone][[id number]]  . LORazepam (ATIVAN) 1 MG tablet    Sig: Take 1 tablet (1 mg total) by mouth 3 (three) times daily as needed for anxiety.    Dispense:  15 tablet    Refill:  0    Order Specific Question:   Supervising Provider    Answer:   Linna HoffKINDL, JAMES D [phone][[id number]]  . azithromycin (ZITHROMAX) 250 MG tablet    Sig: Take 1 tablet (250 mg total) by mouth daily. Take first 2 tablets together, then 1 every day until finished.     Dispense:  6 tablet    Refill:  0    Order Specific Question:   Supervising Provider    Answer:   Linna HoffKINDL, JAMES D [phone][[id number]]   I will treat for bronchitis with zithromax.  I advised to ED if symptoms worsen or change.   An After Visit Summary was printed and given to the patient.   Lonia SkinnerLeslie K CussetaSofia, PA-C 09/03/16 1738

## 2016-10-10 ENCOUNTER — Telehealth: Payer: Self-pay | Admitting: Cardiology

## 2016-10-10 NOTE — Telephone Encounter (Signed)
[facility] on 10/12/16 with Dr Antoine PocheHochrein.  Records put with Dr Hochrein's schedule on 10/12/16. lp

## 2016-10-11 NOTE — Progress Notes (Signed)
HPI  The patient presents for follow-up of CAD. She was hospitalized in 2016 with chest pain. She was found to have obstructive disease in the circumflex vessel requiring DES. Her EF was 60%. She has not returned for follow-up in a couple of years. She established with a new primary care doctor who refers her. Her biggest complaint has been depression and anxiety. She's not been able to get her anxiolytic and was put on Zoloft recently. She denies any chest pressure, neck or arm discomfort. She's not had any new palpitations, presyncope or syncope. She has had a bronchitis and has been a little short of breath related to this. She's had none of the symptoms that was her previous heart attack. She speaks through an interpreter.   Allergies  Allergen Reactions  . Clindamycin/Lincomycin Swelling    Tongue swelling, angioedema  . Peanut Butter Flavor Shortness Of Breath    Swelling and airway closes  . Peanuts [Peanut Oil] Shortness Of Breath    Swelling and airway closes    Current Outpatient Prescriptions  Medication Sig Dispense Refill  . amLODipine (NORVASC) 5 MG tablet Take 1 tablet (5 mg total) by mouth daily. 90 tablet 3  . aspirin 81 MG chewable tablet Chew 1 tablet by mouth daily.    Marland Kitchen atorvastatin (LIPITOR) 80 MG tablet Take 1 tablet by mouth daily. At 6 pm    . azithromycin (ZITHROMAX) 250 MG tablet Take 1 tablet (250 mg total) by mouth daily. Take first 2 tablets together, then 1 every day until finished. 6 tablet 0  . Cholecalciferol (D3-1000) 1000 units capsule Take 1,000 Units by mouth daily.    . cloNIDine (CATAPRES) 0.1 MG tablet Take 1 tablet by mouth at bedtime.    Marland Kitchen esomeprazole (NEXIUM) 40 MG capsule Take 1 capsule by mouth daily with breakfast.    . glimepiride (AMARYL) 4 MG tablet Take 1 tablet by mouth daily with breakfast.    . levothyroxine (SYNTHROID, LEVOTHROID) 50 MCG tablet Take 1 tablet by mouth daily.    Marland Kitchen LORazepam (ATIVAN) 1 MG tablet Take 1 tablet (1 mg  total) by mouth 3 (three) times daily as needed for anxiety. 15 tablet 0  . losartan (COZAAR) 100 MG tablet Take 1 tablet by mouth daily.    . metFORMIN (GLUCOPHAGE) 500 MG tablet Take 1 tablet by mouth 2 (two) times daily.    . metoprolol tartrate (LOPRESSOR) 25 MG tablet Take 1 tablet by mouth daily.    . sertraline (ZOLOFT) 25 MG tablet Take 1 tablet by mouth daily.     No current facility-administered medications for this visit.     Past Medical History:  Diagnosis Date  . Abnormal chest x-ray   . Angioedema    a. 05/2014 - felt to be 2/2 clindamycin therapy.  . Anxiety   . CAD (coronary artery disease)    a. 07/2014 NSTEMI/PCI: LM nl, LAD min irregs, LCX 48m (4.0x18 Xience DES), LPDA 90 (2mm vessel->med Rx), RCA nl, EF 60%.  . Diabetes mellitus without complication (HCC)   . Hyperlipemia   . Hypertension   . Hypothyroidism     Past Surgical History:  Procedure Laterality Date  . INTUBATION NASOTRACHEAL N/A 04/03/2013   Procedure: FIBEROPTIC NASAL-TRACHEAL INTUBATION;  Surgeon: Christia Reading, MD;  Location: [facility];  Service: ENT;  Laterality: N/A;  . LEFT HEART CATHETERIZATION WITH CORONARY ANGIOGRAM N/A 07/26/2014   Procedure: LEFT HEART CATHETERIZATION WITH CORONARY ANGIOGRAM;  Surgeon: Runell Gess, MD;  Location: [facility];  Service: Cardiovascular;  Laterality: N/A;    ROS:    As stated in the HPI and negative for all other systems.  PHYSICAL EXAM BP (!) 192/73   Pulse (!) 53   Ht 5' (1.524 m)   Wt 160 lb (72.6 kg)   BMI 31.25 kg/m  GENERAL:  Well appearing NECK:  No jugular venous distention, waveform within normal limits, carotid upstroke brisk and symmetric, no bruits, no thyromegaly LUNGS:  Clear to auscultation bilaterally BACK:  No CVA tenderness CHEST:  Unremarkable HEART:  PMI not displaced or sustained,S1 and S2 within normal limits, no S3, no S4, no clicks, no rubs,  murmurs ABD:  Flat, positive bowel sounds normal in frequency in pitch, no bruits, no  rebound, no guarding, no midline pulsatile mass, no hepatomegaly, no splenomegaly EXT:  2 plus pulses throughout, no edema, no cyanosis no clubbing  EKG:    Sinus rhythm, rate 53, axis within normal limits, intervals within normal limits, no acute ST-T wave changes, lateral T-wave inversions unchanged from previous.  10/14/2016   ASSESSMENT AND PLAN   CAD:    The patient has no new sypmtoms.  No further cardiovascular testing is indicated.  We will continue with aggressive risk reduction and meds as listed.  She can stop her Brilinta.  HTN:    Her blood pressure is elevated. I will increase her Norvasc which was started recently.    HLD:     She should have a fasting lipid profile on the 80 mg of Lipitor.  I will defer to her primary provider.  I see that this was drawn and I will try to obtain the results.    DM:    I will defer to her primary care physician.  A1C is pending.

## 2016-10-12 ENCOUNTER — Ambulatory Visit (INDEPENDENT_AMBULATORY_CARE_PROVIDER_SITE_OTHER): Payer: Medicare Other | Admitting: Cardiology

## 2016-10-12 ENCOUNTER — Encounter: Payer: Self-pay | Admitting: Cardiology

## 2016-10-12 VITALS — BP 192/73 | HR 53 | Ht 60.0 in | Wt 160.0 lb

## 2016-10-12 DIAGNOSIS — I2510 Atherosclerotic heart disease of native coronary artery without angina pectoris: Secondary | ICD-10-CM | POA: Diagnosis not present

## 2016-10-12 DIAGNOSIS — E785 Hyperlipidemia, unspecified: Secondary | ICD-10-CM

## 2016-10-12 DIAGNOSIS — I10 Essential (primary) hypertension: Secondary | ICD-10-CM | POA: Diagnosis not present

## 2016-10-12 DIAGNOSIS — E119 Type 2 diabetes mellitus without complications: Secondary | ICD-10-CM | POA: Diagnosis not present

## 2016-10-12 MED ORDER — AMLODIPINE BESYLATE 5 MG PO TABS: 5.0000 mg | ORAL_TABLET | Freq: Every day | ORAL | 3 refills | Status: DC

## 2016-10-12 NOTE — Patient Instructions (Addendum)
Medication Instructions:  INCREASE- Amlodipine 5 mg daily STOP- Brilinta  Labwork: None Ordered  Testing/Procedures: None Ordered  Follow-Up: Your physician wants you to follow-up in: 1 Year. You will receive a reminder letter in the mail two months in advance. If you don't receive a letter, please call our office to schedule the follow-up appointment.   Any Other Special Instructions Will Be Listed Below (If Applicable).   If you need a refill on your cardiac medications before your next appointment, please call your pharmacy.

## 2016-10-14 ENCOUNTER — Encounter: Payer: Self-pay | Admitting: Cardiology

## 2016-10-14 DIAGNOSIS — E785 Hyperlipidemia, unspecified: Secondary | ICD-10-CM | POA: Insufficient documentation

## 2016-10-14 DIAGNOSIS — I2510 Atherosclerotic heart disease of native coronary artery without angina pectoris: Secondary | ICD-10-CM | POA: Insufficient documentation

## 2016-10-30 ENCOUNTER — Other Ambulatory Visit: Payer: Self-pay | Admitting: Family Medicine

## 2016-10-30 DIAGNOSIS — E2839 Other primary ovarian failure: Secondary | ICD-10-CM

## 2016-11-19 ENCOUNTER — Ambulatory Visit
Admission: RE | Admit: 2016-11-19 | Discharge: 2016-11-19 | Disposition: A | Discharge status: Home / Self Care | Payer: Medicare Other | Source: Ambulatory Visit | Attending: Family Medicine | Admitting: Family Medicine

## 2016-11-19 DIAGNOSIS — E2839 Other primary ovarian failure: Secondary | ICD-10-CM

## 2017-06-03 IMAGING — DX DG CHEST 2V
2 series · 2 of 2 positions shown · non-contrast
Comparison: Prior radiograph from 04/27/2015.

CLINICAL DATA: Initial evaluation for acute cough for 1 week.

EXAM:
CHEST  2 VIEW

[chest pa]
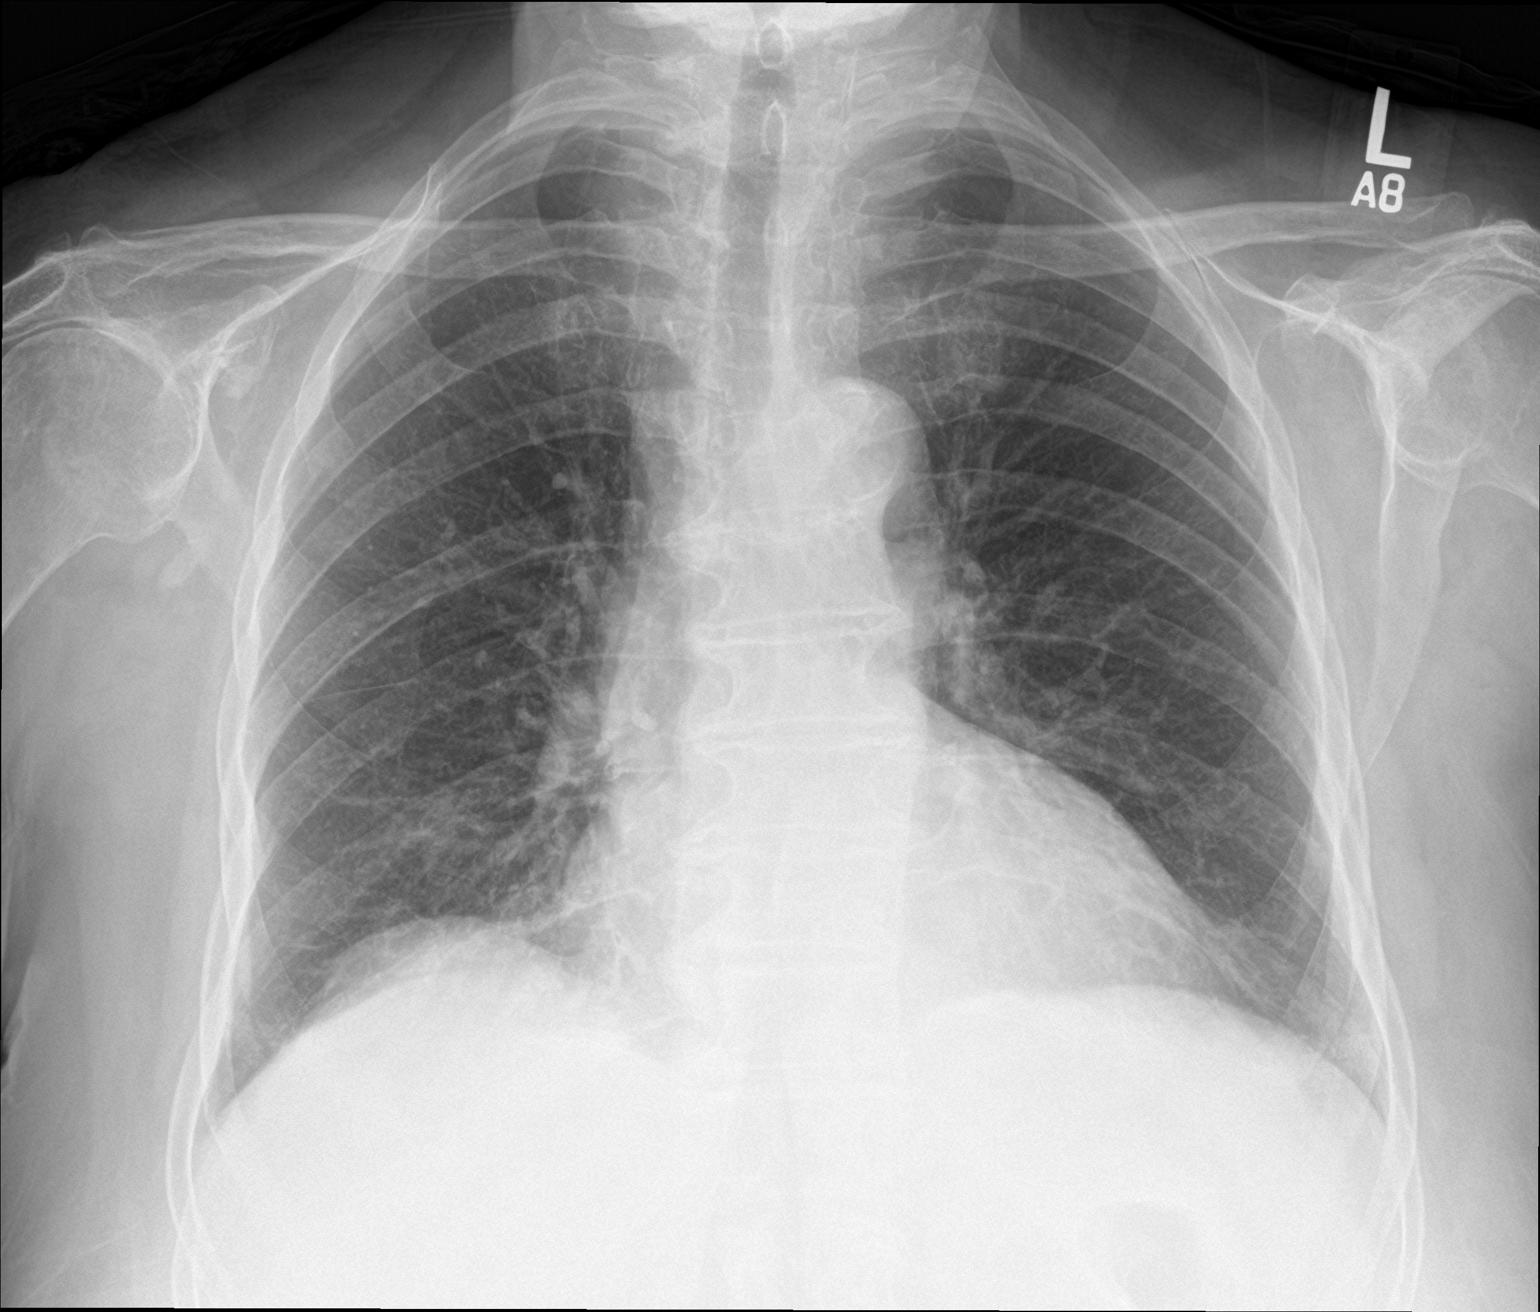

[chest lat]
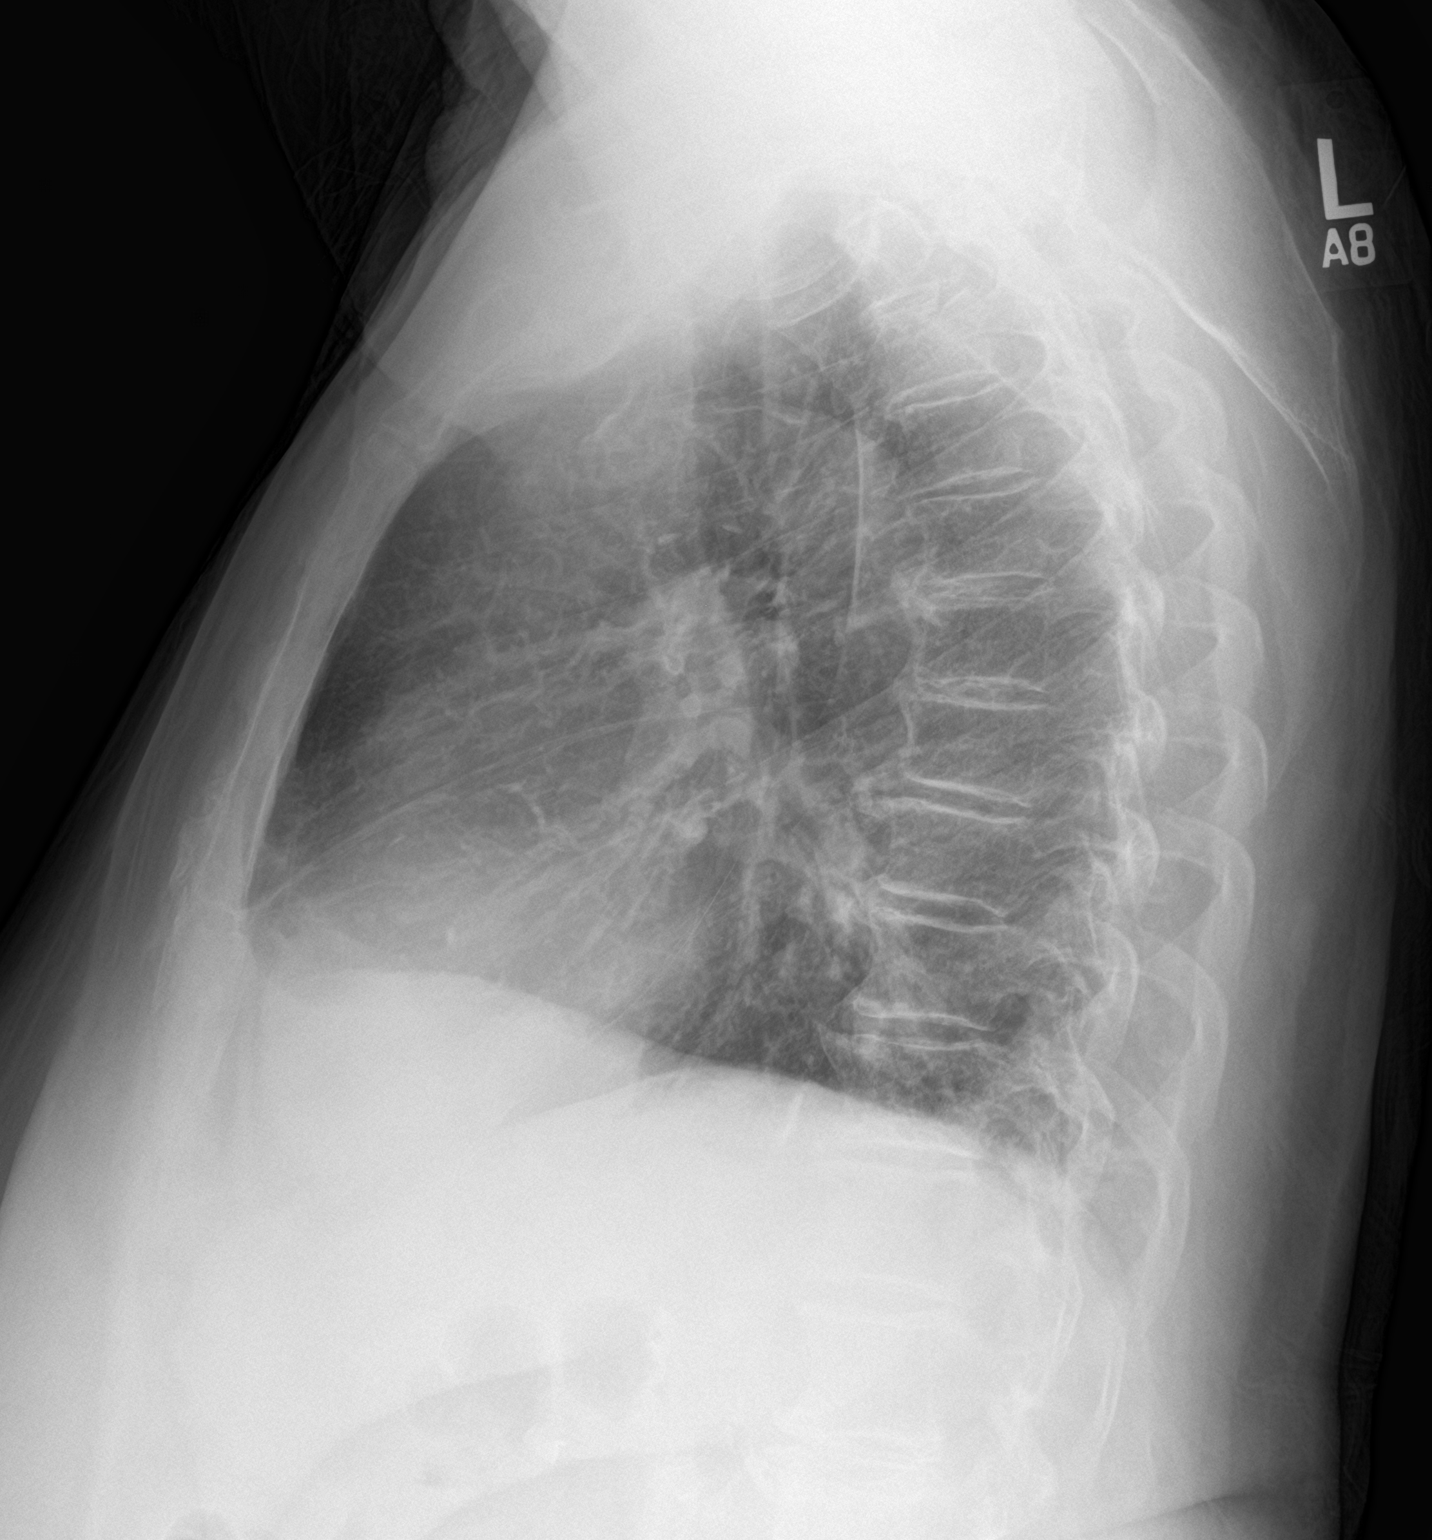

[2 of 2 positions shown; findings below may reference images not displayed]

FINDINGS: The mild cardiomegaly, stable. Mediastinal silhouette within normal
limits. Aortic atherosclerosis noted.

Lungs mildly hypoinflated. Mild bibasilar subsegmental atelectasis.
No focal infiltrates identified. No pulmonary edema or pleural
effusion. No pneumothorax.

No acute osseous abnormality. Multilevel degenerative changes noted
within the visualized spine.
IMPRESSION: 1. Shallow lung inflation with mild bibasilar subsegmental
atelectasis. No other active cardiopulmonary disease identified.
2. Stable mild cardiomegaly.  No pulmonary edema.
3. Aortic atherosclerosis.

## 2017-08-07 ENCOUNTER — Other Ambulatory Visit: Payer: Self-pay | Admitting: Cardiology

## 2017-08-07 NOTE — Telephone Encounter (Signed)
REFILL 

## 2017-11-05 ENCOUNTER — Other Ambulatory Visit: Payer: Self-pay | Admitting: Cardiology

## 2018-02-02 ENCOUNTER — Other Ambulatory Visit: Payer: Self-pay | Admitting: Cardiology

## 2018-05-05 ENCOUNTER — Other Ambulatory Visit: Payer: Self-pay | Admitting: Cardiology

## 2018-08-01 ENCOUNTER — Other Ambulatory Visit: Payer: Self-pay | Admitting: Cardiology

## 2018-08-09 ENCOUNTER — Emergency Department (HOSPITAL_COMMUNITY)
Admission: EM | Admit: 2018-08-09 | Discharge: 2018-08-09 | Disposition: A | Discharge status: Home / Self Care | Payer: Medicare Other | Attending: Emergency Medicine | Admitting: Emergency Medicine

## 2018-08-09 ENCOUNTER — Encounter (HOSPITAL_COMMUNITY): Payer: Self-pay

## 2018-08-09 ENCOUNTER — Other Ambulatory Visit: Payer: Self-pay

## 2018-08-09 DIAGNOSIS — I10 Essential (primary) hypertension: Secondary | ICD-10-CM | POA: Diagnosis not present

## 2018-08-09 DIAGNOSIS — Z79899 Other long term (current) drug therapy: Secondary | ICD-10-CM | POA: Insufficient documentation

## 2018-08-09 DIAGNOSIS — T783XXA Angioneurotic edema, initial encounter: Secondary | ICD-10-CM | POA: Insufficient documentation

## 2018-08-09 DIAGNOSIS — E119 Type 2 diabetes mellitus without complications: Secondary | ICD-10-CM | POA: Diagnosis not present

## 2018-08-09 DIAGNOSIS — Z91010 Allergy to peanuts: Secondary | ICD-10-CM | POA: Insufficient documentation

## 2018-08-09 DIAGNOSIS — Z7982 Long term (current) use of aspirin: Secondary | ICD-10-CM | POA: Diagnosis not present

## 2018-08-09 DIAGNOSIS — Z7984 Long term (current) use of oral hypoglycemic drugs: Secondary | ICD-10-CM | POA: Diagnosis not present

## 2018-08-09 DIAGNOSIS — E039 Hypothyroidism, unspecified: Secondary | ICD-10-CM | POA: Diagnosis not present

## 2018-08-09 DIAGNOSIS — I2510 Atherosclerotic heart disease of native coronary artery without angina pectoris: Secondary | ICD-10-CM | POA: Diagnosis not present

## 2018-08-09 DIAGNOSIS — R220 Localized swelling, mass and lump, head: Secondary | ICD-10-CM | POA: Diagnosis present

## 2018-08-09 LAB — BASIC METABOLIC PANEL
Anion gap: 7 (ref 5–15)
BUN: 26 mg/dL — ABNORMAL HIGH (ref 8–23)
CO2: 22 mmol/L (ref 22–32)
Calcium: 9.4 mg/dL (ref 8.9–10.3)
Chloride: 110 mmol/L (ref 98–111)
Creatinine, Ser: 0.8 mg/dL (ref 0.44–1.00)
GFR calc Af Amer: >60 mL/min (ref >60)
GFR calc non Af Amer: >60 mL/min (ref >60)
GLUCOSE: 203 mg/dL — AB (ref 70–99)
Potassium: 4.4 mmol/L (ref 3.5–5.1)
Sodium: 139 mmol/L (ref 135–145)

## 2018-08-09 LAB — CBC
HCT: 38.3 % (ref 36.0–46.0)
Hemoglobin: 11.9 g/dL — ABNORMAL LOW (ref 12.0–15.0)
MCH: 28.8 pg (ref 26.0–34.0)
MCHC: 31.1 g/dL (ref 30.0–36.0)
MCV: 92.7 fL (ref 80.0–100.0)
Platelets: 236 10*3/uL (ref 150–400)
RBC: 4.13 MIL/uL (ref 3.87–5.11)
RDW: 12.8 % (ref 11.5–15.5)
WBC: 11.5 10*3/uL — ABNORMAL HIGH (ref 4.0–10.5)
nRBC: 0 % (ref 0.0–0.2)

## 2018-08-09 LAB — C-REACTIVE PROTEIN: CRP: 2.6 mg/dL — ABNORMAL HIGH (ref <1.0)

## 2018-08-09 LAB — SEDIMENTATION RATE: Sed Rate: 50 mm/hr — ABNORMAL HIGH (ref 0–22)

## 2018-08-09 MED ORDER — METHYLPREDNISOLONE SODIUM SUCC 125 MG IJ SOLR
125.0000 mg | Freq: Once | INTRAMUSCULAR | Status: AC
  Administered 2018-08-09: 125 mg via INTRAVENOUS
  Filled 2018-08-09: qty 2

## 2018-08-09 MED ORDER — FAMOTIDINE IN NACL 20-0.9 MG/50ML-% IV SOLN
20.0000 mg | Freq: Once | INTRAVENOUS | Status: AC
  Administered 2018-08-09: 20 mg via INTRAVENOUS
  Filled 2018-08-09: qty 50

## 2018-08-09 MED ORDER — DIPHENHYDRAMINE HCL 50 MG/ML IJ SOLN
25.0000 mg | Freq: Once | INTRAMUSCULAR | Status: AC
  Administered 2018-08-09: 25 mg via INTRAVENOUS
  Filled 2018-08-09: qty 1

## 2018-08-09 MED ORDER — FAMOTIDINE 20 MG PO TABS: 20.0000 mg | ORAL_TABLET | Freq: Two times a day (BID) | ORAL | 0 refills | Status: DC

## 2018-08-09 MED ORDER — DIPHENHYDRAMINE HCL 25 MG PO TABS: 25.0000 mg | ORAL_TABLET | Freq: Four times a day (QID) | ORAL | 0 refills | Status: DC | PRN

## 2018-08-09 MED ORDER — PREDNISONE 20 MG PO TABS: ORAL_TABLET | ORAL | 0 refills | Status: DC

## 2018-08-09 NOTE — Discharge Instructions (Addendum)
You will have to discontinue your losartan (Cozaar).  This may be causing your swelling in the mouth. You will need to increase your dose of amlodipine (Norvasc) to 10 mg a day. Go fill your prescriptions this evening and start taking your first dose of prednisone at bedtime.  Take another dose of Pepcid at bedtime and a dose of Benadryl 25 mg. Continue Pepcid and Benadryl tomorrow.  If symptoms are significantly improved you can discontinue these 2 medications and finish the prednisone as prescribed. Return to the emergency department immediately if there is increased swelling in your mouth, tongue or throat.

## 2018-08-09 NOTE — ED Notes (Signed)
She is in no distress. Her daughter maintains her vigil at her bedside. She continues to deny any throat swelling.

## 2018-08-09 NOTE — ED Notes (Signed)
She is lightly napping, and is relaxed in appearance. Her breathing remains normal.

## 2018-08-09 NOTE — ED Notes (Signed)
Bed: ZT24 Expected date:  Expected time:  Means of arrival:  Comments: Allergic reaction

## 2018-08-09 NOTE — ED Provider Notes (Signed)
[facility]-EMERGENCY DEPT Provider Note   CSN: [id number] Arrival date & time: 08/09/18  1617     History   Chief Complaint Chief Complaint  Patient presents with  . Oral Swelling    HPI Jo Larson is a 85 y.o. female.  HPI Patient's daughter is acting as Nurse, learning disabilitytranslator.  She reports that the patient had an episode of severe allergic reaction about 2 years ago and got a lot of swelling in her mouth and her neck.  She describes the patient as having been intubated on a ventilator for a few days.  Ultimately, she reports it was attributed to not allergy.  She reports he did see an allergist and was diagnosed with nut allergy.  She reports that today patient started to get swelling in her mouth and tightness around her throat at about 2 in the afternoon.  She reports the only thing she ate was milk and bread and no potential nut exposure.  She took Benadryl and her daughter reports that is about 50% better than it was in terms of the degree of swelling.  Patient is still perceives a fullness under her tongue and some tightness in her throat but much better than previously.  I have reviewed the medications patient is on losartan.  I asked her daughter if when they had a appointment with the allergist, there is been any mention of avoiding ACE inhibitors or ARB use.  She reports there was no discussion that she recalls about avoiding any particular medications that the patient was taking. Past Medical History:  Diagnosis Date  . Abnormal chest x-ray   . Angioedema    a. 05/2014 - felt to be 2/2 clindamycin therapy.  . Anxiety   . CAD (coronary artery disease)    a. 07/2014 NSTEMI/PCI: LM nl, LAD min irregs, LCX 4062m (4.0x18 Xience DES), LPDA 90 (2mm vessel->med Rx), RCA nl, EF 60%.  . Diabetes mellitus without complication (HCC)   . Hyperlipemia   . Hypertension   . Hypothyroidism     Patient Active Problem List   Diagnosis Date Noted  . Dyslipidemia 10/14/2016  .  Coronary artery disease involving native coronary artery of native heart without angina pectoris 10/14/2016  . Chest pain, atypical 04/27/2015  . CAD S/P percutaneous coronary angioplasty 07/27/2014  . Abnormal chest x-ray 07/27/2014  . History of non-ST elevation myocardial infarction (NSTEMI) 07/24/2014  . Hypokalemia 07/24/2014  . Angioedema 04/03/2013  . Acute respiratory failure (HCC) 04/03/2013  . POSITIVE PPD 05/17/2010  . URINALYSIS, ABNORMAL 04/24/2010  . SYSTOLIC MURMUR 09/13/2009  . Hypothyroidism 01/05/2008  . Hyperlipidemia 03/03/2007  . ANXIETY DISORDER, GENERALIZED 03/03/2007  . INSOMNIA, PERSISTENT 03/03/2007  . OSTEOARTHRITIS 03/03/2007  . DEFICIENCY, VITAMIN D NOS 08/05/2006  . Type 2 diabetes mellitus without complication, without long-term current use of insulin (HCC) 06/04/1997  . HYPERTENSION, BENIGN ESSENTIAL 07/23/1981    Past Surgical History:  Procedure Laterality Date  . INTUBATION NASOTRACHEAL N/A 04/03/2013   Procedure: FIBEROPTIC NASAL-TRACHEAL INTUBATION;  Surgeon: Christia Readingwight Bates, MD;  Location: [facility];  Service: ENT;  Laterality: N/A;  . LEFT HEART CATHETERIZATION WITH CORONARY ANGIOGRAM N/A 07/26/2014   Procedure: LEFT HEART CATHETERIZATION WITH CORONARY ANGIOGRAM;  Surgeon: Runell GessJonathan J Berry, MD;  Location[facility];  Service: Cardiovascular;  Laterality: N/A;     OB History   No obstetric history on file.      Home Medications    Prior to Admission medications   Medication Sig Start Date End  Date Taking? Authorizing Provider  amLODipine (NORVASC) 5 MG tablet Take 1 tablet (5 mg total) by mouth daily. 10/12/16 10/12/17  Rollene RotundaHochrein, James, MD  aspirin 81 MG chewable tablet Chew 1 tablet by mouth daily.    [provider]  atorvastatin (LIPITOR) 80 MG tablet Take 1 tablet by mouth daily. At 6 pm 08/20/16   [provider]  azithromycin (ZITHROMAX) 250 MG tablet Take 1 tablet (250 mg total) by mouth daily. Take first 2 tablets  together, then 1 every day until finished. 09/03/16   Elson AreasSofia, Leslie K, PA-C  Cholecalciferol (D3-1000) 1000 units capsule Take 1,000 Units by mouth daily.    [provider]  cloNIDine (CATAPRES) 0.1 MG tablet Take 1 tablet by mouth at bedtime. 07/20/16   [provider]  diphenhydrAMINE (BENADRYL) 25 MG tablet Take 1 tablet (25 mg total) by mouth every 6 (six) hours as needed for allergies. 08/09/18   Arby BarrettePfeiffer, Affie Gasner, MD  esomeprazole (NEXIUM) 40 MG capsule Take 1 capsule by mouth daily with breakfast. 08/20/16   [provider]  famotidine (PEPCID) 20 MG tablet Take 1 tablet (20 mg total) by mouth 2 (two) times daily. 08/09/18   Arby BarrettePfeiffer, Zailyn Thoennes, MD  glimepiride (AMARYL) 4 MG tablet Take 1 tablet by mouth daily with breakfast. 07/20/16   [provider]  levothyroxine (SYNTHROID, LEVOTHROID) 50 MCG tablet Take 1 tablet by mouth daily. 09/02/16   [provider]  LORazepam (ATIVAN) 1 MG tablet Take 1 tablet (1 mg total) by mouth 3 (three) times daily as needed for anxiety. 09/03/16   Elson AreasSofia, Leslie K, PA-C  losartan (COZAAR) 100 MG tablet Take 1 tablet by mouth daily. 07/20/16   [provider]  losartan (COZAAR) 100 MG tablet Take 1 tablet (100 mg total) by mouth daily. SCHEDULE IV FOR FURTHER REFILLS. 2ND ATEMPT 08/01/18   Rollene RotundaHochrein, James, MD  metFORMIN (GLUCOPHAGE) 500 MG tablet Take 1 tablet by mouth 2 (two) times daily. 08/22/16   [provider]  metoprolol tartrate (LOPRESSOR) 25 MG tablet Take 1 tablet by mouth daily. 08/20/16   [provider]  predniSONE (DELTASONE) 20 MG tablet 2 tabs po daily x 3 days 08/09/18   Arby BarrettePfeiffer, Bailey Kolbe, MD  sertraline (ZOLOFT) 25 MG tablet Take 1 tablet by mouth daily. 10/01/16 10/01/17  [provider]    Family History Family History  Problem Relation Age of Onset  . Hypertension Father   . Diabetes Father     Social History Social History   Tobacco Use  . Smoking status: Never  Smoker  . Smokeless tobacco: Never Used  Substance Use Topics  . Alcohol use: No  . Drug use: No     Allergies   Clindamycin/lincomycin; Peanut butter flavor; and Peanuts [peanut oil]   Review of Systems Review of Systems 10 Systems reviewed and are negative for acute change except as noted in the HPI.   Physical Exam Updated Vital Signs BP (!) 173/69 (BP Location: Right Arm)   Pulse (!) 55   Resp 17   SpO2 96%   Physical Exam Constitutional:      Appearance: Normal appearance.  HENT:     Head: Normocephalic and atraumatic.     Nose: Nose normal.     Mouth/Throat:     Mouth: Mucous membranes are moist.     Comments: Patient has edematous swelling beneath her tongue and the floor the mouth.  She cannot open her mouth completely and I can visualize the posterior tonsillar pillars.  Patient can bring her tongue all the way in her mouth and close it without difficulty and without respiratory distress.  See attached images for angioedema beneath the tongue. Eyes:     Extraocular Movements: Extraocular movements intact.  Neck:     Musculoskeletal: Neck supple.     Comments: No stridor Cardiovascular:     Rate and Rhythm: Normal rate and regular rhythm.     Pulses: Normal pulses.     Heart sounds: Normal heart sounds.  Pulmonary:     Effort: Pulmonary effort is normal.     Breath sounds: Normal breath sounds.  Abdominal:     General: There is no distension.     Palpations: Abdomen is soft.     Tenderness: There is no abdominal tenderness.  Musculoskeletal: Normal range of motion.        General: No swelling.     Right lower leg: No edema.     Left lower leg: No edema.  Skin:    General: Skin is warm and dry.     Findings: No rash.  Neurological:     General: No focal deficit present.     Mental Status: She is alert.  Psychiatric:        Mood and Affect: Mood normal.            ED Treatments / Results  Labs (all labs ordered are listed, but only  abnormal results are displayed) Labs Reviewed  CBC - Abnormal; Notable for the following components:      Result Value   WBC 11.5 (*)    Hemoglobin 11.9 (*)    All other components within normal limits  BASIC METABOLIC PANEL - Abnormal; Notable for the following components:   Glucose, Bld 203 (*)    BUN 26 (*)    All other components within normal limits  SEDIMENTATION RATE - Abnormal; Notable for the following components:   Sed Rate 50 (*)    All other components within normal limits  C-REACTIVE PROTEIN - Abnormal; Notable for the following components:   CRP 2.6 (*)    All other components within normal limits    EKG None  Radiology No results found.  Procedures Procedures (including critical care time)  Medications Ordered in ED Medications  methylPREDNISolone sodium succinate (SOLU-MEDROL) 125 mg/2 mL injection 125 mg (125 mg Intravenous Given 08/09/18 1713)  diphenhydrAMINE (BENADRYL) injection 25 mg (25 mg Intravenous Given 08/09/18 1713)  famotidine (PEPCID) IVPB 20 mg premix (0 mg Intravenous Stopped 08/09/18 1746)     Initial Impression / Assessment and Plan / ED Course  I have reviewed the triage vital signs and the nursing notes.  Pertinent labs & imaging results that were available during my care of the patient were reviewed by me and considered in my medical decision making (see chart for details).  Clinical Course as of Aug 10 1927  Sat Aug 09, 2018  4098 Patient feels improved.  She does not perceive any tightness in her throat.  Patient has had a cup of water to drink and no difficulty swallowing or breathing.  There has been no increasing or worsening of swelling.  Patient's posterior airway is easily visualized.   [MP]    Clinical Course User Index [MP] Arby Barrette, MD   Patient has prior history of angioedema.  She just started getting some swelling in the mouth today.  No apparent trigger that she can think of.  Patient was not having any airway  compromise.  She did have edema below the tongue.  The posterior airway is visualized.  She reports that she had gotten significant improvement before coming to the emergency department after taking Benadryl.  Patient was given a dose of Solu-Medrol, Benadryl and Pepcid.  She was observed for another 2 hours.  There is been no progression of airway obstruction.  She has some edema below the tongue but can easily stick out the tongue and open her mouth wide for good visualization.  At this time she is drinking and breathing without difficulty and there is been no worsening.  Patient is still on an ARB which is the only highly suspicious potential etiology at this time.  Will have her discontinue losartan.  Patient's daughter reports they live nearby and can be at the hospital quickly if there is any worsening or advancing of swelling.  Final Clinical Impressions(s) / ED Diagnoses   Final diagnoses:  Angioedema, initial encounter    ED Discharge Orders         Ordered    predniSONE (DELTASONE) 20 MG tablet     08/09/18 1924    diphenhydrAMINE (BENADRYL) 25 MG tablet  Every 6 hours PRN     08/09/18 1924    famotidine (PEPCID) 20 MG tablet  2 times daily     08/09/18 1924           Arby Barrette, MD 08/09/18 1936

## 2018-08-09 NOTE — ED Triage Notes (Signed)
She c/o tongue swelling today. She is here with her daughter, who is working as a Nurse, mental health (pt. Speaks [place]). Pt. Tells [place] she has done this at least once in the past. She states she is on "all the same medications I jhave been on for a long time--no new medications". She is in no distress and is having no trouble breathing.

## 2018-11-03 ENCOUNTER — Other Ambulatory Visit: Payer: Self-pay | Admitting: Cardiology

## 2019-02-07 ENCOUNTER — Other Ambulatory Visit: Payer: Self-pay | Admitting: Cardiology

## 2019-05-18 ENCOUNTER — Other Ambulatory Visit: Payer: Self-pay

## 2019-05-20 ENCOUNTER — Other Ambulatory Visit: Payer: Self-pay

## 2019-05-21 ENCOUNTER — Other Ambulatory Visit: Payer: Self-pay

## 2019-05-22 ENCOUNTER — Other Ambulatory Visit: Payer: Self-pay

## 2019-05-25 ENCOUNTER — Other Ambulatory Visit: Payer: Self-pay

## 2019-05-25 MED ORDER — LOSARTAN POTASSIUM 100 MG PO TABS: 100.0000 mg | ORAL_TABLET | Freq: Every day | ORAL | 0 refills | Status: DC

## 2019-08-21 ENCOUNTER — Other Ambulatory Visit: Payer: Self-pay | Admitting: Cardiology

## 2019-08-23 ENCOUNTER — Other Ambulatory Visit: Payer: Self-pay | Admitting: Cardiology

## 2019-08-24 ENCOUNTER — Other Ambulatory Visit: Payer: Self-pay | Admitting: Cardiology

## 2020-05-23 ENCOUNTER — Emergency Department (HOSPITAL_COMMUNITY)
Admission: EM | Admit: 2020-05-23 | Discharge: 2020-05-23 | Disposition: A | Discharge status: Home / Self Care | Payer: Medicare Other | Attending: Emergency Medicine | Admitting: Emergency Medicine

## 2020-05-23 ENCOUNTER — Encounter (HOSPITAL_COMMUNITY): Payer: Self-pay

## 2020-05-23 ENCOUNTER — Emergency Department (HOSPITAL_COMMUNITY): Payer: Medicare Other

## 2020-05-23 ENCOUNTER — Other Ambulatory Visit: Payer: Self-pay

## 2020-05-23 DIAGNOSIS — S52502A Unspecified fracture of the lower end of left radius, initial encounter for closed fracture: Secondary | ICD-10-CM | POA: Diagnosis not present

## 2020-05-23 DIAGNOSIS — W01198A Fall on same level from slipping, tripping and stumbling with subsequent striking against other object, initial encounter: Secondary | ICD-10-CM | POA: Diagnosis not present

## 2020-05-23 DIAGNOSIS — S52602A Unspecified fracture of lower end of left ulna, initial encounter for closed fracture: Secondary | ICD-10-CM | POA: Diagnosis not present

## 2020-05-23 DIAGNOSIS — S0003XA Contusion of scalp, initial encounter: Secondary | ICD-10-CM | POA: Diagnosis not present

## 2020-05-23 DIAGNOSIS — S6992XA Unspecified injury of left wrist, hand and finger(s), initial encounter: Secondary | ICD-10-CM | POA: Diagnosis present

## 2020-05-23 DIAGNOSIS — W19XXXA Unspecified fall, initial encounter: Secondary | ICD-10-CM

## 2020-05-23 NOTE — ED Triage Notes (Signed)
Patient tripped in the middle of the night when going to the kitchen.  Patient c/o left forearm and left wrist pain.  Patient states she "hit her head a little.". Patient does take Aspirin 81 mg.

## 2020-05-23 NOTE — ED Provider Notes (Signed)
[facility] Note   CSN: [id number] Arrival date & time: 05/23/20  5732     History Chief Complaint  Patient presents with  . Arm Injury    Jo Larson is a 86 y.o. female.  Patient brought in by family member.  She had a fall during the night when going to the kitchen.  She tripped.  Complaint of pain to her left forearm left wrist.  She also did strike her left side of her head.  She has a little bit of bruising to the temporal area.  Denies any neck pain.  No other extremity injuries.  No lower extremity pain.  Patient's been able to ambulate.  Patient is on a baby aspirin a day no other blood thinners.        Past Medical History:  Diagnosis Date  . Abnormal chest x-ray   . Angioedema    a. 05/2014 - felt to be 2/2 clindamycin therapy.  . Anxiety   . CAD (coronary artery disease)    a. 07/2014 NSTEMI/PCI: LM nl, LAD min irregs, LCX 54m (4.0x18 Xience DES), LPDA 90 (100mm vessel->med Rx), RCA nl, EF 60%.  . Diabetes mellitus without complication (HCC)   . Hyperlipemia   . Hypertension   . Hypothyroidism     Patient Active Problem List   Diagnosis Date Noted  . Dyslipidemia 10/14/2016  . Coronary artery disease involving native coronary artery of native heart without angina pectoris 10/14/2016  . Chest pain, atypical 04/27/2015  . CAD S/P percutaneous coronary angioplasty 07/27/2014  . Abnormal chest x-ray 07/27/2014  . History of non-ST elevation myocardial infarction (NSTEMI) 07/24/2014  . Hypokalemia 07/24/2014  . Angioedema 04/03/2013  . Acute respiratory failure (HCC) 04/03/2013  . POSITIVE PPD 05/17/2010  . URINALYSIS, ABNORMAL 04/24/2010  . SYSTOLIC MURMUR 09/13/2009  . Hypothyroidism 01/05/2008  . Hyperlipidemia 03/03/2007  . ANXIETY DISORDER, GENERALIZED 03/03/2007  . INSOMNIA, PERSISTENT 03/03/2007  . OSTEOARTHRITIS 03/03/2007  . DEFICIENCY, VITAMIN D NOS 08/05/2006  . Type 2 diabetes mellitus without  complication, without long-term current use of insulin (HCC) 06/04/1997  . HYPERTENSION, BENIGN ESSENTIAL 07/23/1981    Past Surgical History:  Procedure Laterality Date  . INTUBATION NASOTRACHEAL N/A 04/03/2013   Procedure: FIBEROPTIC NASAL-TRACHEAL INTUBATION;  Surgeon: Christia Reading, MD;  Location: [facility];  Service: ENT;  Laterality: N/A;  . LEFT HEART CATHETERIZATION WITH CORONARY ANGIOGRAM N/A 07/26/2014   Procedure: LEFT HEART CATHETERIZATION WITH CORONARY ANGIOGRAM;  Surgeon: Runell Gess, MD;  Location: [facility] CATH LAB;  Service: Cardiovascular;  Laterality: N/A;     OB History   No obstetric history on file.     Family History  Problem Relation Age of Onset  . Hypertension Father   . Diabetes Father     Social History   Tobacco Use  . Smoking status: Never Smoker  . Smokeless tobacco: Never Used  Vaping Use  . Vaping Use: Never used  Substance Use Topics  . Alcohol use: No  . Drug use: No    Home Medications Prior to Admission medications   Medication Sig Start Date End Date Taking? Authorizing Provider  amLODipine (NORVASC) 5 MG tablet Take 1 tablet (5 mg total) by mouth daily. 10/12/16 10/12/17  Rollene Rotunda, MD  aspirin 81 MG chewable tablet Chew 1 tablet by mouth daily.    [provider]  atorvastatin (LIPITOR) 80 MG tablet Take 1 tablet by mouth daily. At 6 pm 08/20/16   [provider]  azithromycin (ZITHROMAX) 250 MG tablet Take 1 tablet (250 mg total) by mouth daily. Take first 2 tablets together, then 1 every day until finished. 09/03/16   Elson Areas, PA-C  Cholecalciferol (D3-1000) 1000 units capsule Take 1,000 Units by mouth daily.    [provider]  cloNIDine (CATAPRES) 0.1 MG tablet Take 1 tablet by mouth at bedtime. 07/20/16   [provider]  diphenhydrAMINE (BENADRYL) 25 MG tablet Take 1 tablet (25 mg total) by mouth every 6 (six) hours as needed for allergies. 08/09/18   Arby Barrette, MD  esomeprazole  (NEXIUM) 40 MG capsule Take 1 capsule by mouth daily with breakfast. 08/20/16   [provider]  famotidine (PEPCID) 20 MG tablet Take 1 tablet (20 mg total) by mouth 2 (two) times daily. 08/09/18   Arby Barrette, MD  glimepiride (AMARYL) 4 MG tablet Take 1 tablet by mouth daily with breakfast. 07/20/16   [provider]  levothyroxine (SYNTHROID, LEVOTHROID) 50 MCG tablet Take 1 tablet by mouth daily. 09/02/16   [provider]  LORazepam (ATIVAN) 1 MG tablet Take 1 tablet (1 mg total) by mouth 3 (three) times daily as needed for anxiety. 09/03/16   Elson Areas, PA-C  losartan (COZAAR) 100 MG tablet Take 1 tablet by mouth daily. 07/20/16   [provider]  losartan (COZAAR) 100 MG tablet Take 1 tablet (100 mg total) by mouth daily. 08/21/19   Rollene Rotunda, MD  metFORMIN (GLUCOPHAGE) 500 MG tablet Take 1 tablet by mouth 2 (two) times daily. 08/22/16   [provider]  metoprolol tartrate (LOPRESSOR) 25 MG tablet Take 1 tablet by mouth daily. 08/20/16   [provider]  predniSONE (DELTASONE) 20 MG tablet 2 tabs po daily x 3 days 08/09/18   Arby Barrette, MD  sertraline (ZOLOFT) 25 MG tablet Take 1 tablet by mouth daily. 10/01/16 10/01/17  [provider]    Allergies    Clindamycin/lincomycin, Peanut butter flavor, and Peanuts [peanut oil]  Review of Systems   Review of Systems  Constitutional: Negative for chills and fever.  HENT: Negative for congestion, rhinorrhea and sore throat.   Eyes: Negative for visual disturbance.  Respiratory: Negative for cough and shortness of breath.   Cardiovascular: Negative for chest pain and leg swelling.  Gastrointestinal: Negative for abdominal pain, diarrhea, nausea and vomiting.  Genitourinary: Negative for dysuria.  Musculoskeletal: Positive for joint swelling. Negative for back pain and neck pain.  Skin: Negative for rash.  Neurological: Negative for dizziness, light-headedness and  headaches.  Hematological: Does not bruise/bleed easily.  Psychiatric/Behavioral: Negative for confusion.    Physical Exam Updated Vital Signs BP (!) 145/58 (BP Location: Right Arm)   Pulse (!) 51   Temp 98.2 F (36.8 C) (Oral)   Resp 17   Ht 1.524 m (5')   Wt 78.5 kg   SpO2 98%   BMI 33.79 kg/m   Physical Exam Vitals and nursing note reviewed.  Constitutional:      General: She is not in acute distress.    Appearance: Normal appearance. She is well-developed.  HENT:     Head: Normocephalic.     Comments: Some slight bruising to the left temporal area. Eyes:     Extraocular Movements: Extraocular movements intact.     Conjunctiva/sclera: Conjunctivae normal.     Pupils: Pupils are equal, round, and reactive to light.  Cardiovascular:     Rate and Rhythm: Normal rate and regular rhythm.     Heart sounds:  No murmur heard.   Pulmonary:     Effort: Pulmonary effort is normal. No respiratory distress.     Breath sounds: Normal breath sounds.  Abdominal:     Palpations: Abdomen is soft.     Tenderness: There is no abdominal tenderness.  Musculoskeletal:        General: Tenderness, deformity and signs of injury present.     Cervical back: Normal range of motion and neck supple.     Comments: Slight deformity to distal left forearm.  Good movement at the elbow and shoulder.  Radial pulses 2+.  Good movement of fingers.  Cap refill less than 2 seconds.  Sensation intact.  Is also some bruising to the left wrist area.  No breakdown in the skin.  Skin:    General: Skin is warm and dry.     Capillary Refill: Capillary refill takes less than 2 seconds.  Neurological:     General: No focal deficit present.     Mental Status: She is alert and oriented to person, place, and time.     Cranial Nerves: No cranial nerve deficit.     Sensory: No sensory deficit.     ED Results / Procedures / Treatments   Labs (all labs ordered are listed, but only abnormal results are  displayed) Labs Reviewed - No data to display  EKG None  Radiology DG Forearm Left  Result Date: 05/23/2020 CLINICAL DATA:  Fall today with left forearm pain EXAM: LEFT FOREARM - 2 VIEW COMPARISON:  None. FINDINGS: Comminuted mildly impacted distal metaphysis left radius fracture with 4 mm dorsal displacement of the dominant distal fracture fragment. No additional fractures. No dislocation at the elbow or wrist on these views. Diffuse left wrist and distal left forearm soft tissue swelling. No focal osseous lesions. Moderate first carpometacarpal joint osteoarthritis. No radiopaque foreign bodies. IMPRESSION: Comminuted mildly impacted and displaced distal metaphysis left radius fracture as detailed. Electronically Signed   By: Delbert PhenixJason A Poff M.D.   On: 05/23/2020 10:32   DG Wrist Complete Left  Result Date: 05/23/2020 CLINICAL DATA:  Fall today with left wrist pain EXAM: LEFT WRIST - COMPLETE 3+ VIEW COMPARISON:  None. FINDINGS: Comminuted impacted distal left radius fracture with 4 mm dorsal displacement of the dominant distal fracture fragment. Nondisplaced acute ulnar styloid fracture. No dislocation. No suspicious focal osseous lesions. Moderate first carpometacarpal joint osteoarthritis. Diffuse left wrist soft tissue swelling. No radiopaque foreign bodies. IMPRESSION: 1. Comminuted impacted mildly displaced distal left radius fracture. 2. Nondisplaced acute left ulnar styloid fracture. Electronically Signed   By: Delbert PhenixJason A Poff M.D.   On: 05/23/2020 10:33   CT Head Wo Contrast  Result Date: 05/23/2020 CLINICAL DATA:  Head trauma.  Fall.  Hit head. EXAM: CT HEAD WITHOUT CONTRAST TECHNIQUE: Contiguous axial images were obtained from the base of the skull through the vertex without intravenous contrast. COMPARISON:  CT head 03/13/2016. FINDINGS: Brain: No evidence of acute large vascular territory infarction, hemorrhage, hydrocephalus, extra-axial collection or mass lesion/mass effect. Similar white  matter hypoattenuation, compatible with chronic microvascular ischemic disease. Similar generalized cerebral atrophy with ex vacuo ventricular dilation. Vascular: Calcific atherosclerosis. Skull: No acute fracture. Sinuses/Orbits: No acute findings. Other: No mastoid effusions. Similar corticated bony fragment adjacent to the left occipital condyle. IMPRESSION: No evidence of acute intracranial abnormality. Electronically Signed   By: Feliberto HartsFrederick S Jones MD   On: 05/23/2020 13:24    Procedures Procedures (including critical care time)  Medications Ordered in ED Medications - No data  to display  ED Course  I have reviewed the triage vital signs and the nursing notes.  Pertinent labs & imaging results that were available during my care of the patient were reviewed by me and considered in my medical decision making (see chart for details).    MDM Rules/Calculators/A&P                          Head CT negative x-rays show distal left ulnar radius fracture with some impaction and slight displacement.  Treated with a sugar tong splint shoulder immobilizer.  Patient does not have an orthopedic doctor.  Will follow up with Dr. Yevette Edwards from [facility].  Information provided.  Patient feels much more comfortable with the splint in place.     Final Clinical Impression(s) / ED Diagnoses Final diagnoses:  Fall, initial encounter  Closed fracture of distal end of left radius, unspecified fracture morphology, initial encounter  Closed fracture of distal end of left ulna, unspecified fracture morphology, initial encounter    Rx / DC Orders ED Discharge Orders    None       Vanetta Mulders, MD 05/23/20 1350

## 2020-05-23 NOTE — Discharge Instructions (Signed)
CT head negative.  X-ray of the right forearm and wrist showed a fracture keep the splint in place and dry.  Sling for comfort.  Call orthopedics for follow-up.  Information provided above.  Tylenol as needed for pain.  Return for any new or worse symptoms.

## 2020-09-01 ENCOUNTER — Other Ambulatory Visit: Payer: Self-pay | Admitting: Cardiology

## 2020-12-30 ENCOUNTER — Emergency Department (HOSPITAL_COMMUNITY): Payer: Medicare Other

## 2020-12-30 ENCOUNTER — Encounter (HOSPITAL_COMMUNITY): Payer: Self-pay

## 2020-12-30 ENCOUNTER — Inpatient Hospital Stay (HOSPITAL_COMMUNITY)
Admission: EM | Admit: 2020-12-30 | Discharge: 2021-01-05 | DRG: 035 | Disposition: A | Discharge status: Rehabilitation Facility | Payer: Medicare Other | Attending: Internal Medicine | Admitting: Internal Medicine

## 2020-12-30 ENCOUNTER — Inpatient Hospital Stay (HOSPITAL_COMMUNITY): Payer: Medicare Other

## 2020-12-30 DIAGNOSIS — I700 Atherosclerosis of aorta: Secondary | ICD-10-CM | POA: Diagnosis present

## 2020-12-30 DIAGNOSIS — I63031 Cerebral infarction due to thrombosis of right carotid artery: Secondary | ICD-10-CM | POA: Diagnosis not present

## 2020-12-30 DIAGNOSIS — I69398 Other sequelae of cerebral infarction: Secondary | ICD-10-CM | POA: Diagnosis not present

## 2020-12-30 DIAGNOSIS — E7800 Pure hypercholesterolemia, unspecified: Secondary | ICD-10-CM | POA: Diagnosis not present

## 2020-12-30 DIAGNOSIS — Z8249 Family history of ischemic heart disease and other diseases of the circulatory system: Secondary | ICD-10-CM | POA: Diagnosis not present

## 2020-12-30 DIAGNOSIS — Z881 Allergy status to other antibiotic agents status: Secondary | ICD-10-CM | POA: Diagnosis not present

## 2020-12-30 DIAGNOSIS — F32A Depression, unspecified: Secondary | ICD-10-CM | POA: Diagnosis present

## 2020-12-30 DIAGNOSIS — F411 Generalized anxiety disorder: Secondary | ICD-10-CM | POA: Diagnosis present

## 2020-12-30 DIAGNOSIS — E039 Hypothyroidism, unspecified: Secondary | ICD-10-CM | POA: Diagnosis present

## 2020-12-30 DIAGNOSIS — E1165 Type 2 diabetes mellitus with hyperglycemia: Secondary | ICD-10-CM | POA: Diagnosis present

## 2020-12-30 DIAGNOSIS — I6359 Cerebral infarction due to unspecified occlusion or stenosis of other cerebral artery: Secondary | ICD-10-CM

## 2020-12-30 DIAGNOSIS — I10 Essential (primary) hypertension: Secondary | ICD-10-CM | POA: Diagnosis present

## 2020-12-30 DIAGNOSIS — Z7982 Long term (current) use of aspirin: Secondary | ICD-10-CM | POA: Diagnosis not present

## 2020-12-30 DIAGNOSIS — Z79890 Hormone replacement therapy (postmenopausal): Secondary | ICD-10-CM

## 2020-12-30 DIAGNOSIS — I351 Nonrheumatic aortic (valve) insufficiency: Secondary | ICD-10-CM | POA: Diagnosis present

## 2020-12-30 DIAGNOSIS — E781 Pure hyperglyceridemia: Secondary | ICD-10-CM | POA: Diagnosis present

## 2020-12-30 DIAGNOSIS — Z91010 Allergy to peanuts: Secondary | ICD-10-CM | POA: Diagnosis not present

## 2020-12-30 DIAGNOSIS — R29705 NIHSS score 5: Secondary | ICD-10-CM | POA: Diagnosis present

## 2020-12-30 DIAGNOSIS — G8194 Hemiplegia, unspecified affecting left nondominant side: Secondary | ICD-10-CM | POA: Diagnosis present

## 2020-12-30 DIAGNOSIS — E785 Hyperlipidemia, unspecified: Secondary | ICD-10-CM | POA: Diagnosis present

## 2020-12-30 DIAGNOSIS — Z833 Family history of diabetes mellitus: Secondary | ICD-10-CM | POA: Diagnosis not present

## 2020-12-30 DIAGNOSIS — E119 Type 2 diabetes mellitus without complications: Secondary | ICD-10-CM | POA: Diagnosis present

## 2020-12-30 DIAGNOSIS — Z7984 Long term (current) use of oral hypoglycemic drugs: Secondary | ICD-10-CM | POA: Diagnosis not present

## 2020-12-30 DIAGNOSIS — Z6832 Body mass index (BMI) 32.0-32.9, adult: Secondary | ICD-10-CM

## 2020-12-30 DIAGNOSIS — Z955 Presence of coronary angioplasty implant and graft: Secondary | ICD-10-CM | POA: Diagnosis not present

## 2020-12-30 DIAGNOSIS — I69391 Dysphagia following cerebral infarction: Secondary | ICD-10-CM | POA: Diagnosis not present

## 2020-12-30 DIAGNOSIS — I639 Cerebral infarction, unspecified: Secondary | ICD-10-CM | POA: Diagnosis present

## 2020-12-30 DIAGNOSIS — I6330 Cerebral infarction due to thrombosis of unspecified cerebral artery: Secondary | ICD-10-CM | POA: Diagnosis not present

## 2020-12-30 DIAGNOSIS — I252 Old myocardial infarction: Secondary | ICD-10-CM | POA: Diagnosis not present

## 2020-12-30 DIAGNOSIS — R1312 Dysphagia, oropharyngeal phase: Secondary | ICD-10-CM | POA: Diagnosis present

## 2020-12-30 DIAGNOSIS — B370 Candidal stomatitis: Secondary | ICD-10-CM | POA: Diagnosis not present

## 2020-12-30 DIAGNOSIS — I6389 Other cerebral infarction: Secondary | ICD-10-CM | POA: Diagnosis not present

## 2020-12-30 DIAGNOSIS — E11649 Type 2 diabetes mellitus with hypoglycemia without coma: Secondary | ICD-10-CM | POA: Diagnosis not present

## 2020-12-30 DIAGNOSIS — I69319 Unspecified symptoms and signs involving cognitive functions following cerebral infarction: Secondary | ICD-10-CM | POA: Diagnosis not present

## 2020-12-30 DIAGNOSIS — I6521 Occlusion and stenosis of right carotid artery: Secondary | ICD-10-CM | POA: Diagnosis present

## 2020-12-30 DIAGNOSIS — I69354 Hemiplegia and hemiparesis following cerebral infarction affecting left non-dominant side: Secondary | ICD-10-CM | POA: Diagnosis present

## 2020-12-30 DIAGNOSIS — I951 Orthostatic hypotension: Secondary | ICD-10-CM | POA: Diagnosis not present

## 2020-12-30 DIAGNOSIS — Z8673 Personal history of transient ischemic attack (TIA), and cerebral infarction without residual deficits: Secondary | ICD-10-CM

## 2020-12-30 DIAGNOSIS — Z20822 Contact with and (suspected) exposure to covid-19: Secondary | ICD-10-CM | POA: Diagnosis present

## 2020-12-30 DIAGNOSIS — J9811 Atelectasis: Secondary | ICD-10-CM | POA: Diagnosis present

## 2020-12-30 DIAGNOSIS — R29810 Facial weakness: Secondary | ICD-10-CM | POA: Diagnosis present

## 2020-12-30 DIAGNOSIS — I63411 Cerebral infarction due to embolism of right middle cerebral artery: Principal | ICD-10-CM | POA: Diagnosis present

## 2020-12-30 DIAGNOSIS — I2510 Atherosclerotic heart disease of native coronary artery without angina pectoris: Secondary | ICD-10-CM | POA: Diagnosis present

## 2020-12-30 DIAGNOSIS — R0902 Hypoxemia: Secondary | ICD-10-CM | POA: Diagnosis present

## 2020-12-30 DIAGNOSIS — E669 Obesity, unspecified: Secondary | ICD-10-CM | POA: Diagnosis present

## 2020-12-30 DIAGNOSIS — Z79899 Other long term (current) drug therapy: Secondary | ICD-10-CM

## 2020-12-30 DIAGNOSIS — Z603 Acculturation difficulty: Secondary | ICD-10-CM | POA: Diagnosis present

## 2020-12-30 DIAGNOSIS — R209 Unspecified disturbances of skin sensation: Secondary | ICD-10-CM | POA: Diagnosis present

## 2020-12-30 DIAGNOSIS — I63231 Cerebral infarction due to unspecified occlusion or stenosis of right carotid arteries: Secondary | ICD-10-CM | POA: Diagnosis not present

## 2020-12-30 DIAGNOSIS — R2689 Other abnormalities of gait and mobility: Secondary | ICD-10-CM | POA: Diagnosis present

## 2020-12-30 DIAGNOSIS — D72829 Elevated white blood cell count, unspecified: Secondary | ICD-10-CM | POA: Diagnosis not present

## 2020-12-30 LAB — COMPREHENSIVE METABOLIC PANEL
ALT: 17 U/L (ref 0–44)
AST: 17 U/L (ref 15–41)
Albumin: 3.8 g/dL (ref 3.5–5.0)
Alkaline Phosphatase: 56 U/L (ref 38–126)
Anion gap: 9 (ref 5–15)
BUN: 19 mg/dL (ref 8–23)
CO2: 25 mmol/L (ref 22–32)
Calcium: 10 mg/dL (ref 8.9–10.3)
Chloride: 106 mmol/L (ref 98–111)
Creatinine, Ser: 0.72 mg/dL (ref 0.44–1.00)
GFR, Estimated: >60 mL/min (ref >60)
Glucose, Bld: 271 mg/dL — ABNORMAL HIGH (ref 70–99)
Potassium: 4.3 mmol/L (ref 3.5–5.1)
Sodium: 140 mmol/L (ref 135–145)
Total Bilirubin: 0.3 mg/dL (ref 0.3–1.2)
Total Protein: 7.4 g/dL (ref 6.5–8.1)

## 2020-12-30 LAB — CBC WITH DIFFERENTIAL/PLATELET
Abs Immature Granulocytes: 0.06 10*3/uL (ref 0.00–0.07)
Basophils Absolute: 0.1 10*3/uL (ref 0.0–0.1)
Basophils Relative: 1 %
Eosinophils Absolute: 0.3 10*3/uL (ref 0.0–0.5)
Eosinophils Relative: 3 %
HCT: 39.7 % (ref 36.0–46.0)
Hemoglobin: 12.5 g/dL (ref 12.0–15.0)
Immature Granulocytes: 1 %
Lymphocytes Relative: 32 %
Lymphs Abs: 3.1 10*3/uL (ref 0.7–4.0)
MCH: 28.7 pg (ref 26.0–34.0)
MCHC: 31.5 g/dL (ref 30.0–36.0)
MCV: 91.1 fL (ref 80.0–100.0)
Monocytes Absolute: 0.8 10*3/uL (ref 0.1–1.0)
Monocytes Relative: 8 %
Neutro Abs: 5.4 10*3/uL (ref 1.7–7.7)
Neutrophils Relative %: 55 %
Platelets: 301 10*3/uL (ref 150–400)
RBC: 4.36 MIL/uL (ref 3.87–5.11)
RDW: 13.5 % (ref 11.5–15.5)
WBC: 9.7 10*3/uL (ref 4.0–10.5)
nRBC: 0 % (ref 0.0–0.2)

## 2020-12-30 LAB — APTT: aPTT: 31 seconds (ref 24–36)

## 2020-12-30 LAB — GLUCOSE, CAPILLARY: Glucose-Capillary: 141 mg/dL — ABNORMAL HIGH (ref 70–99)

## 2020-12-30 LAB — I-STAT CHEM 8, ED
BUN: 23 mg/dL (ref 8–23)
Calcium, Ion: 1.28 mmol/L (ref 1.15–1.40)
Chloride: 105 mmol/L (ref 98–111)
Creatinine, Ser: 0.6 mg/dL (ref 0.44–1.00)
Glucose, Bld: 280 mg/dL — ABNORMAL HIGH (ref 70–99)
HCT: 34 % — ABNORMAL LOW (ref 36.0–46.0)
Hemoglobin: 11.6 g/dL — ABNORMAL LOW (ref 12.0–15.0)
Potassium: 4.3 mmol/L (ref 3.5–5.1)
Sodium: 139 mmol/L (ref 135–145)
TCO2: 28 mmol/L (ref 22–32)

## 2020-12-30 LAB — PROTIME-INR
INR: 0.9 (ref 0.8–1.2)
Prothrombin Time: 12.6 seconds (ref 11.4–15.2)

## 2020-12-30 LAB — CBG MONITORING, ED: Glucose-Capillary: 134 mg/dL — ABNORMAL HIGH (ref 70–99)

## 2020-12-30 LAB — ETHANOL: Alcohol, Ethyl (B): <10 mg/dL (ref <10)

## 2020-12-30 LAB — RESP PANEL BY RT-PCR (FLU A&B, COVID) ARPGX2
Influenza A by PCR: NEGATIVE
Influenza B by PCR: NEGATIVE
SARS Coronavirus 2 by RT PCR: NEGATIVE

## 2020-12-30 MED ORDER — GADOBUTROL 1 MMOL/ML IV SOLN
7.5000 mL | Freq: Once | INTRAVENOUS | Status: AC | PRN
  Administered 2020-12-30: 7.5 mL via INTRAVENOUS

## 2020-12-30 MED ORDER — ACETAMINOPHEN 325 MG PO TABS
650.0000 mg | ORAL_TABLET | ORAL | Status: DC | PRN
  Administered 2021-01-02 – 2021-01-05 (×5): 650 mg via ORAL
  Filled 2020-12-30 (×6): qty 2

## 2020-12-30 MED ORDER — CLOPIDOGREL BISULFATE 75 MG PO TABS
75.0000 mg | ORAL_TABLET | Freq: Every day | ORAL | Status: DC
  Administered 2020-12-31 – 2021-01-05 (×6): 75 mg via ORAL
  Filled 2020-12-30 (×6): qty 1

## 2020-12-30 MED ORDER — LEVOTHYROXINE SODIUM 88 MCG PO TABS
88.0000 ug | ORAL_TABLET | Freq: Every day | ORAL | Status: DC
  Administered 2020-12-31 – 2021-01-05 (×6): 88 ug via ORAL
  Filled 2020-12-30 (×6): qty 1

## 2020-12-30 MED ORDER — STROKE: EARLY STAGES OF RECOVERY BOOK: Freq: Once | Status: AC

## 2020-12-30 MED ORDER — INSULIN ASPART 100 UNIT/ML IJ SOLN
0.0000 [IU] | Freq: Three times a day (TID) | INTRAMUSCULAR | Status: DC
  Administered 2020-12-31: 5 [IU] via SUBCUTANEOUS
  Administered 2020-12-31 – 2021-01-01 (×2): 3 [IU] via SUBCUTANEOUS
  Administered 2021-01-01: 10 [IU] via SUBCUTANEOUS

## 2020-12-30 MED ORDER — SODIUM CHLORIDE 0.9 % IV SOLN: Freq: Once | INTRAVENOUS | Status: AC

## 2020-12-30 MED ORDER — ASPIRIN 325 MG PO TABS
325.0000 mg | ORAL_TABLET | Freq: Every day | ORAL | Status: DC
  Administered 2020-12-30 – 2020-12-31 (×2): 325 mg via ORAL
  Filled 2020-12-30 (×2): qty 1

## 2020-12-30 MED ORDER — ACETAMINOPHEN 650 MG RE SUPP: 650.0000 mg | RECTAL | Status: DC | PRN

## 2020-12-30 MED ORDER — CLOPIDOGREL BISULFATE 75 MG PO TABS
75.0000 mg | ORAL_TABLET | Freq: Once | ORAL | Status: AC
  Administered 2020-12-30: 75 mg via ORAL
  Filled 2020-12-30: qty 1

## 2020-12-30 MED ORDER — ATORVASTATIN CALCIUM 40 MG PO TABS
40.0000 mg | ORAL_TABLET | Freq: Every day | ORAL | Status: DC
  Administered 2020-12-31 – 2021-01-05 (×7): 40 mg via ORAL
  Filled 2020-12-30 (×2): qty 1
  Filled 2020-12-30: qty 4
  Filled 2020-12-30 (×4): qty 1

## 2020-12-30 MED ORDER — ACETAMINOPHEN 160 MG/5ML PO SOLN: 650.0000 mg | ORAL | Status: DC | PRN

## 2020-12-30 MED ORDER — SENNOSIDES-DOCUSATE SODIUM 8.6-50 MG PO TABS
1.0000 | ORAL_TABLET | Freq: Every evening | ORAL | Status: DC | PRN
  Administered 2021-01-04: 1 via ORAL
  Filled 2020-12-30: qty 1

## 2020-12-30 NOTE — H&P (Signed)
History and Physical    Jo Larson ZOX:[id number] DOB: 10/02/1931 DOA: 12/30/2020  PCP: Macy MisBriscoe, Kim K, MD  Patient coming from: Home  Chief Complaint: left arm weakness  HPI: Jo Larson is a 87 y.o. female with medical history significant of HTN, DM2, hypothyroidism. Presenting with 1 day of left upper extremity weakness. History w/ interpreter as patient is [place] speaking only. The patient and family report that the patient had a noticeably weak left hand grip starting yesterday afternoon. She was having difficulty picking up her mug to drink water. She didn't think much of it, but the daughter tried to convince her to come to the ED. She did not. Her weakness worsened early this morning to the point that she really couldn't life her arm and it felt numb. She felt a lack of balance like she was listing to the left side. Her daughter checked in again on her and found her extremely weak on the left. She convinced her to come be evaluated in the ED. She denies any other aggravating or alleviating factors.     ED Course: Stroke noted on CTH. Neurology was consulted. TRH was called for admission.   Review of Systems:  No CP, dyspnea, palpitations, syncopal episodes, N/V/D, fever.  Review of systems is otherwise negative for all not mentioned in HPI.   PMHx Past Medical History:  Diagnosis Date   Abnormal chest x-ray    Angioedema    a. 05/2014 - felt to be 2/2 clindamycin therapy.   Anxiety    CAD (coronary artery disease)    a. 07/2014 NSTEMI/PCI: LM nl, LAD min irregs, LCX 3769m (4.0x18 Xience DES), LPDA 90 (2mm vessel->med Rx), RCA nl, EF 60%.   Diabetes mellitus without complication (HCC)    Hyperlipemia    Hypertension    Hypothyroidism     PSHx Past Surgical History:  Procedure Laterality Date   INTUBATION NASOTRACHEAL N/A 04/03/2013   Procedure: FIBEROPTIC NASAL-TRACHEAL INTUBATION;  Surgeon: Christia Readingwight Bates, MD;  Location: [facility];  Service: ENT;  Laterality: N/A;   LEFT  HEART CATHETERIZATION WITH CORONARY ANGIOGRAM N/A 07/26/2014   Procedure: LEFT HEART CATHETERIZATION WITH CORONARY ANGIOGRAM;  Surgeon: Runell GessJonathan J Berry, MD;  [facility];  Service: Cardiovascular;  Laterality: N/A;    SocHx  reports that she has never smoked. She has never used smokeless tobacco. She reports that she does not drink alcohol and does not use drugs.  Allergies  Allergen Reactions   Clindamycin/Lincomycin Swelling    Tongue swelling, angioedema   Peanut Butter Flavor Shortness Of Breath    Swelling and airway closes   Peanuts [Peanut Oil] Shortness Of Breath    Swelling and airway closes    FamHx Family History  Problem Relation Age of Onset   Hypertension Father    Diabetes Father     Prior to Admission medications   Medication Sig Start Date End Date Taking? Authorizing Provider  amLODipine (NORVASC) 5 MG tablet Take 1 tablet (5 mg total) by mouth daily. 10/12/16 10/12/17  Rollene RotundaHochrein, James, MD  aspirin 81 MG chewable tablet Chew 1 tablet by mouth daily.    [provider]  atorvastatin (LIPITOR) 80 MG tablet Take 1 tablet by mouth daily. At 6 pm 08/20/16   [provider]  azithromycin (ZITHROMAX) 250 MG tablet Take 1 tablet (250 mg total) by mouth daily. Take first 2 tablets together, then 1 every day until finished. 09/03/16   Elson AreasSofia, Leslie K, PA-C  Cholecalciferol (D3-1000) 1000 units  capsule Take 1,000 Units by mouth daily.    [provider]  cloNIDine (CATAPRES) 0.1 MG tablet Take 1 tablet by mouth at bedtime. 07/20/16   [provider]  diphenhydrAMINE (BENADRYL) 25 MG tablet Take 1 tablet (25 mg total) by mouth every 6 (six) hours as needed for allergies. 08/09/18   Arby Barrette, MD  esomeprazole (NEXIUM) 40 MG capsule Take 1 capsule by mouth daily with breakfast. 08/20/16   [provider]  famotidine (PEPCID) 20 MG tablet Take 1 tablet (20 mg total) by mouth 2 (two) times daily. 08/09/18   Arby Barrette, MD   glimepiride (AMARYL) 4 MG tablet Take 1 tablet by mouth daily with breakfast. 07/20/16   [provider]  levothyroxine (SYNTHROID, LEVOTHROID) 50 MCG tablet Take 1 tablet by mouth daily. 09/02/16   [provider]  LORazepam (ATIVAN) 1 MG tablet Take 1 tablet (1 mg total) by mouth 3 (three) times daily as needed for anxiety. 09/03/16   Elson Areas, PA-C  losartan (COZAAR) 100 MG tablet Take 1 tablet by mouth daily. 07/20/16   [provider]  losartan (COZAAR) 100 MG tablet Take 1 tablet (100 mg total) by mouth daily. 08/21/19   Rollene Rotunda, MD  metFORMIN (GLUCOPHAGE) 500 MG tablet Take 1 tablet by mouth 2 (two) times daily. 08/22/16   [provider]  metoprolol tartrate (LOPRESSOR) 25 MG tablet Take 1 tablet by mouth daily. 08/20/16   [provider]  predniSONE (DELTASONE) 20 MG tablet 2 tabs po daily x 3 days 08/09/18   Arby Barrette, MD  sertraline (ZOLOFT) 25 MG tablet Take 1 tablet by mouth daily. 10/01/16 10/01/17  [provider]    Physical Exam: Vitals:   12/30/20 1300 12/30/20 1330 12/30/20 1402 12/30/20 1430  BP: (!) 185/83 (!) 160/70 (!) 172/75 (!) 160/74  Pulse: (!) 52 (!) 55 (!) 56 (!) 57  Resp: 13 16 12 17   Temp:      TempSrc:      SpO2: 95% 96% 96% 96%  Weight:      Height:        General: 87 y.o. female resting in bed in NAD Eyes: PERRL, normal sclera ENMT: Nares patent w/o discharge, orophaynx clear, dentition normal, ears w/o discharge/lesions/ulcers Neck: Supple, trachea midline Cardiovascular: RRR, +S1, S2, no m/g/r, equal pulses throughout Respiratory: CTABL, no w/r/r, normal WOB GI: BS+, NDNT, no masses noted, no organomegaly noted MSK: No e/c/c Skin: No rashes, bruises, ulcerations noted Neuro: A&O x 3, LUE msk str 3-4/5; left hand grip is 3/5,  Psyc: Somewhat agitated w/ language/culture barrier, so not entirely cooperative with exam  Labs on Admission: I have personally reviewed following labs  and imaging studies  CBC: Recent Labs  Lab 12/30/20 1039 12/30/20 1208  WBC 9.7  --   NEUTROABS 5.4  --   HGB 12.5 11.6*  HCT 39.7 34.0*  MCV 91.1  --   PLT 301  --    Basic Metabolic Panel: Recent Labs  Lab 12/30/20 1039 12/30/20 1208  NA 140 139  K 4.3 4.3  CL 106 105  CO2 25  --   GLUCOSE 271* 280*  BUN 19 23  CREATININE 0.72 0.60  CALCIUM 10.0  --    GFR: Estimated Creatinine Clearance: 44.7 mL/min (by C-G formula based on SCr of 0.6 mg/dL). Liver Function Tests: Recent Labs  Lab 12/30/20 1039  AST 17  ALT 17  ALKPHOS 56  BILITOT 0.3  PROT 7.4  ALBUMIN  3.8   No results for input(s): LIPASE, AMYLASE in the last 168 hours. No results for input(s): AMMONIA in the last 168 hours. Coagulation Profile: Recent Labs  Lab 12/30/20 1039  INR 0.9   Cardiac Enzymes: No results for input(s): CKTOTAL, CKMB, CKMBINDEX, TROPONINI in the last 168 hours. BNP (last 3 results) No results for input(s): PROBNP in the last 8760 hours. HbA1C: No results for input(s): HGBA1C in the last 72 hours. CBG: No results for input(s): GLUCAP in the last 168 hours. Lipid Profile: No results for input(s): CHOL, HDL, LDLCALC, TRIG, CHOLHDL, LDLDIRECT in the last 72 hours. Thyroid Function Tests: No results for input(s): TSH, T4TOTAL, FREET4, T3FREE, THYROIDAB in the last 72 hours. Anemia Panel: No results for input(s): VITAMINB12, FOLATE, FERRITIN, TIBC, IRON, RETICCTPCT in the last 72 hours. Urine analysis:    Component Value Date/Time   COLORURINE YELLOW 03/13/2016 1145   APPEARANCEUR CLOUDY (A) 03/13/2016 1145   LABSPEC 1.029 03/13/2016 1145   PHURINE 5.5 03/13/2016 1145   GLUCOSEU >1000 (A) 03/13/2016 1145   HGBUR NEGATIVE 03/13/2016 1145   HGBUR negative 05/08/2010 0811   BILIRUBINUR NEGATIVE 03/13/2016 1145   KETONESUR NEGATIVE 03/13/2016 1145   PROTEINUR 30 (A) 03/13/2016 1145   UROBILINOGEN 0.2 05/08/2010 0811   NITRITE NEGATIVE 03/13/2016 1145   LEUKOCYTESUR  NEGATIVE 03/13/2016 1145    Radiological Exams on Admission: CT HEAD WO CONTRAST  Result Date: 12/30/2020 CLINICAL DATA:  Numbness and weakness, difficulty ambulating at baseline. Unable to move LEFT hand since 3 a.m. EXAM: CT HEAD WITHOUT CONTRAST TECHNIQUE: Contiguous axial images were obtained from the base of the skull through the vertex without intravenous contrast. COMPARISON:  June 12, 2020. FINDINGS: Brain: New area of hypoattenuation at the gray-white junction in the RIGHT posterior frontal lobe (image 22/2) no sign of intracranial hemorrhage, mass, mass effect or midline shift. Extensive cerebral atrophy and chronic microvascular ischemic change. Vascular: No hyperdense vessel or unexpected calcification. Skull: Normal. Negative for fracture or focal lesion. Sinuses/Orbits: Visualized paranasal sinuses and orbits are unremarkable. Other: None IMPRESSION: New acute or subacute infarct at the high RIGHT posterior frontal lobe. No signs of hemorrhage, mass effect or midline shift. Stable findings of atrophy and chronic microvascular ischemic change. Critical Value/emergent results were called by telephone at the time of interpretation on 12/30/2020 at 12:50 pm to provider CLAUDIA GIBBONS , who verbally acknowledged these results. Electronically Signed   By: Donzetta Kohut M.D.   On: 12/30/2020 12:47    EKG: Independently reviewed. Sinus, no st elevations  Assessment/Plan CVA     - admit to inpt, tele @ [facility]     - neurology consulted     - outside window for tPa     - NPO until swallow screen     - PT/OT/SLP/TOC     - check lipids, A1c     - ASA/plavix ordered     - permissive HTN for now     - MRI brain, MRA H&N ordered     - echo ordered     - statin  DM2     - NPO for now except meds; can have DM diet if she passes screen     - SSI, glucose checks, A1c  Hypothyroidism     - continue home synthroid  HTN     - allowing for permissive HTN for now  DVT prophylaxis: SCDs   Code Status: FULL  Family Communication: w/ dtr at bedside through interpreter  Consults called: Neurology  Status is: Inpatient  Remains inpatient appropriate because:Inpatient level of care appropriate due to severity of illness  Dispo: The patient is from: Home              Anticipated d/c is to:  TBD              Patient currently is not medically stable to d/c.   Difficult to place patient No  Time spent coordinating admission: 60 minutes  Dezzie Badilla A Ambert Virrueta DO [facility]  If 7PM-7AM, please contact night-coverage [email or web address]  12/30/2020, 2:46 PM

## 2020-12-30 NOTE — ED Notes (Signed)
Called [facility] for transport to [facility]

## 2020-12-30 NOTE — ED Triage Notes (Signed)
Pt presents to ED with c/o numbness and inability to move left hand since 2-3am. Hx CVA 5 years ago. Difficulty ambulating at baseline. Denies neurological symptoms in face and legs.  Daughter reports pt last seen normal around midnight.

## 2020-12-30 NOTE — ED Notes (Signed)
Attempted to call report to 3W with no answer. Will attempt to call back shortly.

## 2020-12-30 NOTE — ED Provider Notes (Signed)
[facility]-EMERGENCY DEPT Provider Note   CSN: [id number] Arrival date & time: 12/30/20  1026     History Chief Complaint  Patient presents with   Extremity Weakness    Left hand    Zarayah Daino is a 87 y.o. female   With history of diabetes, hypertension, GERD presents to ER with daughter for evaluation of "numbness" and weakness in left arm and hand. This was noticed at 2:30 am when she was trying to drink out of a cup of water.  States she couldn't hold the cup up.  Daughter had come back from work and checked in on her and noted her left hand and arm were drooping. Patient states left arm feels numb. Can't tell if tingling or decreased sensation.  Reports history of left arm fracture from a fall a year ago but states there is no associated pain in her left arm.  Has noticed gradual decline in walking, gait and balance over the last several months.  She feels like when she stands up her body is "pulled" to the left side. No facial or left leg weakness.  No difficulty with speech. No visual changes or deficits.  No headache. No chest pain. Daughter states patient has had 1 stroke 5 years ago but does not have any deficits from that stroke. Patient needs one person assist to ambulate.   Of note, patient speaks [place].  [place] interpreter was used during Audiological scientistencounter.       Past Medical History:  Diagnosis Date   Abnormal chest x-ray    Angioedema    a. 05/2014 - felt to be 2/2 clindamycin therapy.   Anxiety    CAD (coronary artery disease)    a. 07/2014 NSTEMI/PCI: LM nl, LAD min irregs, LCX 2556m (4.0x18 Xience DES), LPDA 90 (2mm vessel->med Rx), RCA nl, EF 60%.   Diabetes mellitus without complication (HCC)    Hyperlipemia    Hypertension    Hypothyroidism     Patient Active Problem List   Diagnosis Date Noted   CVA (cerebral vascular accident) (HCC) 12/30/2020   Dyslipidemia 10/14/2016   Coronary artery disease involving native coronary artery of  native heart without angina pectoris 10/14/2016   Chest pain, atypical 04/27/2015   CAD S/P percutaneous coronary angioplasty 07/27/2014   Abnormal chest x-ray 07/27/2014   History of non-ST elevation myocardial infarction (NSTEMI) 07/24/2014   Hypokalemia 07/24/2014   Angioedema 04/03/2013   Acute respiratory failure (HCC) 04/03/2013   POSITIVE PPD 05/17/2010   URINALYSIS, ABNORMAL 04/24/2010   SYSTOLIC MURMUR 09/13/2009   Hypothyroidism 01/05/2008   Hyperlipidemia 03/03/2007   ANXIETY DISORDER, GENERALIZED 03/03/2007   INSOMNIA, PERSISTENT 03/03/2007   OSTEOARTHRITIS 03/03/2007   DEFICIENCY, VITAMIN D NOS 08/05/2006   Type 2 diabetes mellitus without complication, without long-term current use of insulin (HCC) 06/04/1997   HYPERTENSION, BENIGN ESSENTIAL 07/23/1981    Past Surgical History:  Procedure Laterality Date   INTUBATION NASOTRACHEAL N/A 04/03/2013   Procedure: FIBEROPTIC NASAL-TRACHEAL INTUBATION;  Surgeon: Christia Readingwight Bates, MD;  Location: [facility];  Service: ENT;  Laterality: N/A;   LEFT HEART CATHETERIZATION WITH CORONARY ANGIOGRAM N/A 07/26/2014   Procedure: LEFT HEART CATHETERIZATION WITH CORONARY ANGIOGRAM;  Surgeon: Runell GessJonathan J Berry, MD;  [facility];  Service: Cardiovascular;  Laterality: N/A;     OB History   No obstetric history on file.     Family History  Problem Relation Age of Onset   Hypertension Father    Diabetes Father  Social History   Tobacco Use   Smoking status: Never   Smokeless tobacco: Never  Vaping Use   Vaping Use: Never used  Substance Use Topics   Alcohol use: No   Drug use: No    Home Medications Prior to Admission medications   Medication Sig Start Date End Date Taking? Authorizing Provider  acetaminophen (TYLENOL) 325 MG tablet Take 325 mg by mouth in the morning and at bedtime.   Yes [provider]  amLODipine (NORVASC) 10 MG tablet Take 1 tablet by mouth daily. 09/27/20  Yes [provider]   atorvastatin (LIPITOR) 20 MG tablet Take 1 tablet by mouth daily. 11/07/20  Yes [provider]  Cholecalciferol 25 MCG (1000 UT) capsule Take 1,000 Units by mouth daily.   Yes [provider]  cloNIDine (CATAPRES) 0.1 MG tablet Take 1 tablet by mouth 2 (two) times daily. 07/20/16  Yes [provider]  esomeprazole (NEXIUM) 40 MG capsule Take 1 capsule by mouth daily with breakfast. 08/20/16  Yes [provider]  glimepiride (AMARYL) 2 MG tablet Take 2 mg by mouth daily. 11/03/20  Yes [provider]  levothyroxine (SYNTHROID) 88 MCG tablet Take 88 mcg by mouth daily. 11/22/20  Yes [provider]  losartan (COZAAR) 100 MG tablet Take 1 tablet (100 mg total) by mouth daily. 08/21/19  Yes Rollene Rotunda, MD  metFORMIN (GLUCOPHAGE-XR) 500 MG 24 hr tablet Take 1,500 mg by mouth daily with breakfast. 12/01/20  Yes [provider]  metoprolol tartrate (LOPRESSOR) 25 MG tablet Take 12.5 mg by mouth 2 (two) times daily. 08/20/16  Yes [provider]  sertraline (ZOLOFT) 100 MG tablet Take 1 tablet by mouth daily. 11/03/20  Yes [provider]  amLODipine (NORVASC) 5 MG tablet Take 1 tablet (5 mg total) by mouth daily. 10/12/16 10/12/17  Rollene Rotunda, MD  azithromycin (ZITHROMAX) 250 MG tablet Take 1 tablet (250 mg total) by mouth daily. Take first 2 tablets together, then 1 every day until finished. Patient not taking: Reported on 12/30/2020 09/03/16   Elson Areas, PA-C  diphenhydrAMINE (BENADRYL) 25 MG tablet Take 1 tablet (25 mg total) by mouth every 6 (six) hours as needed for allergies. Patient not taking: Reported on 12/30/2020 08/09/18   Arby Barrette, MD  famotidine (PEPCID) 20 MG tablet Take 1 tablet (20 mg total) by mouth 2 (two) times daily. Patient not taking: Reported on 12/30/2020 08/09/18   Arby Barrette, MD  LORazepam (ATIVAN) 1 MG tablet Take 1 tablet (1 mg total) by mouth 3 (three) times daily as needed for  anxiety. Patient not taking: Reported on 12/30/2020 09/03/16   Elson Areas, PA-C  predniSONE (DELTASONE) 20 MG tablet 2 tabs po daily x 3 days Patient not taking: Reported on 12/30/2020 08/09/18   Arby Barrette, MD    Allergies    Clindamycin/lincomycin, Peanut butter flavor, and Peanuts [peanut oil]  Review of Systems   Review of Systems  Neurological:  Positive for weakness and numbness.  All other systems reviewed and are negative.  Physical Exam Updated Vital Signs BP (!) 144/66   Pulse (!) 55   Temp 98.3 F (36.8 C) (Oral)   Resp 18   Ht 5' (1.524 m)   Wt 74.4 kg   SpO2 95%   BMI 32.03 kg/m   Physical Exam Vitals and nursing note reviewed.  Constitutional:      General: She is not in acute distress.    Appearance: She is well-developed.  Comments: NAD.  HENT:     Head: Normocephalic and atraumatic.     Right Ear: External ear normal.     Left Ear: External ear normal.     Nose: Nose normal.  Eyes:     General: No scleral icterus.    Conjunctiva/sclera: Conjunctivae normal.  Cardiovascular:     Rate and Rhythm: Normal rate and regular rhythm.     Heart sounds: Normal heart sounds.  Pulmonary:     Effort: Pulmonary effort is normal.     Breath sounds: Normal breath sounds.  Musculoskeletal:        General: No deformity. Normal range of motion.     Cervical back: Normal range of motion and neck supple.  Skin:    General: Skin is warm and dry.     Capillary Refill: Capillary refill takes less than 2 seconds.  Neurological:     Mental Status: She is alert.     Motor: Weakness and pronator drift present.     Comments:   Mental Status: Patient is awake, alert, oriented to person, place, year, and situation. Patient is able to give a clear and coherent history. Speech - speaking in [place].  No signs of neglect.  Cranial Nerves: I not tested II Unable to test visual fields due to language barrier. PERRL.   III, IV, VI EOMs intact without ptosis V  sensation to light touch intact in all 3 divisions of trigeminal nerve bilaterally  VII facial movements symmetric bilaterally VIII hearing intact to voice/conversation  IX, X no uvula deviation, symmetric rise of soft palate/uvula XI 5/5 SCM and trapezius strength bilaterally  XII tongue protrusion midline, symmetric L/R movements  Motor: 4/5 strength in LUE. 4/5 strength in LLE.  Strength 5/5 in upper/lower right extremities.  Sensation to light touch intact in face, upper/lower extremities. Patient cannot flex or abduct shoulder past 45 degrees.    Cerebellar: No ataxia with finger to nose (right). Unable to perform FTN with left.  Patient needed assistance standing up and walking. Subtle left leg/foot drag Steady gait. No truncal sway. Normal Romberg.   Psychiatric:        Behavior: Behavior normal.        Thought Content: Thought content normal.        Judgment: Judgment normal.    ED Results / Procedures / Treatments   Labs (all labs ordered are listed, but only abnormal results are displayed) Labs Reviewed  COMPREHENSIVE METABOLIC PANEL - Abnormal; Notable for the following components:      Result Value   Glucose, Bld 271 (*)    All other components within normal limits  I-STAT CHEM 8, ED - Abnormal; Notable for the following components:   Glucose, Bld 280 (*)    Hemoglobin 11.6 (*)    HCT 34.0 (*)    All other components within normal limits  RESP PANEL BY RT-PCR (FLU A&B, COVID) ARPGX2  ETHANOL  PROTIME-INR  APTT  CBC WITH DIFFERENTIAL/PLATELET  DIFFERENTIAL  RAPID URINE DRUG SCREEN, HOSP PERFORMED  URINALYSIS, ROUTINE W REFLEX MICROSCOPIC    EKG EKG Interpretation  Date/Time:  Friday December 30 2020 10:38:43 EDT Ventricular Rate:  64 PR Interval:  151 QRS Duration: 82 QT Interval:  365 QTC Calculation: 377 R Axis:   32 Text Interpretation: Sinus rhythm Nonspecific T abnormalities, lateral leads No significant change since last tracing Confirmed by Susy Frizzle [phone]) on 12/30/2020 11:47:37 AM  Radiology CT HEAD WO CONTRAST  Result Date: 12/30/2020 CLINICAL  DATA:  Numbness and weakness, difficulty ambulating at baseline. Unable to move LEFT hand since 3 a.m. EXAM: CT HEAD WITHOUT CONTRAST TECHNIQUE: Contiguous axial images were obtained from the base of the skull through the vertex without intravenous contrast. COMPARISON:  June 12, 2020. FINDINGS: Brain: New area of hypoattenuation at the gray-white junction in the RIGHT posterior frontal lobe (image 22/2) no sign of intracranial hemorrhage, mass, mass effect or midline shift. Extensive cerebral atrophy and chronic microvascular ischemic change. Vascular: No hyperdense vessel or unexpected calcification. Skull: Normal. Negative for fracture or focal lesion. Sinuses/Orbits: Visualized paranasal sinuses and orbits are unremarkable. Other: None IMPRESSION: New acute or subacute infarct at the high RIGHT posterior frontal lobe. No signs of hemorrhage, mass effect or midline shift. Stable findings of atrophy and chronic microvascular ischemic change. Critical Value/emergent results were called by telephone at the time of interpretation on 12/30/2020 at 12:50 pm to provider Corrin Hingle , who verbally acknowledged these results. Electronically Signed   By: Donzetta Kohut M.D.   On: 12/30/2020 12:47    Procedures .Critical Care  Date/Time: 12/30/2020 4:06 PM Performed by: Liberty Handy, PA-C Authorized by: Liberty Handy, PA-C   Critical care provider statement:    Critical care time (minutes):  45   Critical care was necessary to treat or prevent imminent or life-threatening deterioration of the following conditions:  CNS failure or compromise (acute stroke)   Critical care was time spent personally by me on the following activities:  Discussions with consultants, evaluation of patient's response to treatment, examination of patient, ordering and performing treatments and interventions,  ordering and review of laboratory studies, ordering and review of radiographic studies, pulse oximetry, re-evaluation of patient's condition, obtaining history from patient or surrogate, review of old charts and development of treatment plan with patient or surrogate   I assumed direction of critical care for this patient from another provider in my specialty: no     Care discussed with: admitting provider     Medications Ordered in ED Medications  aspirin tablet 325 mg (325 mg Oral Given 12/30/20 1551)  clopidogrel (PLAVIX) tablet 75 mg (75 mg Oral Given 12/30/20 1551)    ED Course  I have reviewed the triage vital signs and the nursing notes.  Pertinent labs & imaging results that were available during my care of the patient were reviewed by me and considered in my medical decision making (see chart for details).  Clinical Course as of 12/30/20 1606  Fri Dec 30, 2020  1249 CT positive for high posterior frontal right lobe per Dr Crissie Reese radiology  [CG]    Clinical Course User Index [CG] Liberty Handy, PA-C   MDM Rules/Calculators/A&P                           87 y.o. yo female presents to the ED for left arm weakness that began at 2:30 AM, last known normal at midnight.  On exam she has significant left arm weakness, and weakness in the left leg to a milder degree.  Additional information obtained from chart, nursing and triage notes review  Chart review reveals -diabetes, hypertension.  Patient and daughter stated that she had a stroke in the past but I do not see documented history of this.  Ordered lab, imaging were personally reviewed and interpreted  Labs reveal -hyperglycemia glucose 271, normal anion gap.  Mild anemia hemoglobin 11.6.  Labs otherwise unremarkable.  Imaging reveals -CT  head reveals acute right posterior frontal lobe infarct with no other complicating features  Medicines ordered -Plavix and aspirin per neurology recommendations  ED course & MDM 1605:  Patient has been reevaluated.  She has developed left-sided facial numbness as well as subtle lip drooping.  Continues to endorse weakness in the left arm.  I spoke to neuro hospitalist who recommends further neuroimaging, Plavix and aspirin and medicine admission.  Neurology will see once patient is transferred to [facility].  I spoke to hospitalist who has admitted patient for further care.  Discussed with EDP.  Portions of this note were generated with Scientist, clinical (histocompatibility and immunogenetics). Dictation errors may occur despite best attempts at proofreading   Final Clinical Impression(s) / ED Diagnoses Final diagnoses:  Cerebrovascular accident (CVA), unspecified mechanism [facility])    Rx / DC Orders ED Discharge Orders     None        Jerrell Mylar 12/30/20 1606    Pollyann Savoy, MD 01/02/21 [phone]

## 2020-12-30 NOTE — ED Notes (Addendum)
Attempted to call report, but RN is busy at this time. Secretary stated RN would call back when finished.

## 2020-12-30 NOTE — ED Notes (Signed)
Pt transported to MRI 

## 2020-12-30 NOTE — ED Notes (Signed)
Pt. Aware urine sample needed 

## 2020-12-30 NOTE — Consult Note (Signed)
NEUROLOGY CONSULTATION NOTE   Date of service: December 30, 2020 Patient Name: Jo Larson MRN:  [id number] DOB:  10/11/1931 Reason for consult: "Stroke" Requesting Provider: Teddy SpikeKyle, Tyrone A, DO _ _ _   _ __   _ __ _ _  __ __   _ __   __ _  History of Present Illness  Jo Larson is a 87 y.o. female with PMH significant for DM2, HTN, HLD, CAD, hypothyroidism who presents with left hand grip weakness with a LKW of 12/28/20.  Patient speaks croatian. Grand daughter in law reports that she has been complaining of left sided weakness and pain for the last couple of days. They noted that today she could not do anything with the left arm so she was brought in to the ED. MRI Brain was obtained and notable for patchy R frontoparietal lobe infarcts including the perirolandic region. No LVO on MR Angios.  She does not smoke, takes aspirin at home, no prior strokes.  mRS: 2 tPA/Thrombectomy: outside window NIHSS components Score: Comment  1a Level of Conscious 0[x]  1[]  2[]  3[]      1b LOC Questions 0[x]  1[]  2[]       1c LOC Commands 0[x]  1[]  2[]       2 Best Gaze 0[x]  1[]  2[]       3 Visual 0[x]  1[]  2[]  3[]      4 Facial Palsy 0[x]  1[]  2[]  3[]      5a Motor Arm - left 0[]  1[]  2[]  3[x]  4[]  UN[]    5b Motor Arm - Right 0[x]  1[]  2[]  3[]  4[]  UN[]    6a Motor Leg - Left 0[x]  1[]  2[]  3[]  4[]  UN[]    6b Motor Leg - Right 0[x]  1[]  2[]  3[]  4[]  UN[]    7 Limb Ataxia 0[x]  1[]  2[]  3[]  UN[]     8 Sensory 0[]  1[x]  2[]  UN[]      9 Best Language 0[x]  1[]  2[]  3[]      10 Dysarthria 0[x]  1[]  2[]  UN[]      11 Extinct. and Inattention 0[x]  1[]  2[]       TOTAL: 4      ROS   Constitutional Denies weight loss, fever and chills.   HEENT Denies changes in vision and hearing.   Respiratory Denies SOB and cough.   CV Denies palpitations and CP   GI Denies abdominal pain, nausea, vomiting and diarrhea.   GU Denies dysuria and urinary frequency.   MSK Denies myalgia and joint pain.   Skin Denies rash and pruritus.    Neurological Denies headache and syncope.   Psychiatric Denies recent changes in mood. Denies anxiety and depression.    Past History   Past Medical History:  Diagnosis Date   Abnormal chest x-ray    Angioedema    a. 05/2014 - felt to be 2/2 clindamycin therapy.   Anxiety    CAD (coronary artery disease)    a. 07/2014 NSTEMI/PCI: LM nl, LAD min irregs, LCX 6826m (4.0x18 Xience DES), LPDA 90 (2mm vessel->med Rx), RCA nl, EF 60%.   Diabetes mellitus without complication (HCC)    Hyperlipemia    Hypertension    Hypothyroidism    Past Surgical History:  Procedure Laterality Date   INTUBATION NASOTRACHEAL N/A 04/03/2013   Procedure: FIBEROPTIC NASAL-TRACHEAL INTUBATION;  Surgeon: Christia Readingwight Bates, MD;  Location: [facility];  Service: ENT;  Laterality: N/A;   LEFT HEART CATHETERIZATION WITH CORONARY ANGIOGRAM N/A 07/26/2014   Procedure: LEFT HEART CATHETERIZATION WITH CORONARY ANGIOGRAM;  Surgeon: Runell GessJonathan J Berry, MD;  [facility];  Service: Cardiovascular;  Laterality: N/A;   Family History  Problem Relation Age of Onset   Hypertension Father    Diabetes Father    Social History   Socioeconomic History   Marital status: Married    Spouse name: Not on file   Number of children: 3   Years of education: Not on file   Highest education level: Not on file  Occupational History   Not on file  Tobacco Use   Smoking status: Never   Smokeless tobacco: Never  Vaping Use   Vaping Use: Never used  Substance and Sexual Activity   Alcohol use: No   Drug use: No   Sexual activity: Never  Other Topics Concern   Not on file  Social History Narrative   Lives at home with daughter in law.    Social Determinants of Health   Financial Resource Strain: Not on file  Food Insecurity: Not on file  Transportation Needs: Not on file  Physical Activity: Not on file  Stress: Not on file  Social Connections: Not on file   Allergies  Allergen Reactions   Clindamycin/Lincomycin Swelling     Tongue swelling, angioedema   Peanut Butter Flavor Shortness Of Breath    Swelling and airway closes   Peanuts [Peanut Oil] Shortness Of Breath    Swelling and airway closes    Medications   Medications Prior to Admission  Medication Sig Dispense Refill Last Dose   acetaminophen (TYLENOL) 325 MG tablet Take 325 mg by mouth in the morning and at bedtime.   12/30/2020   amLODipine (NORVASC) 10 MG tablet Take 1 tablet by mouth daily.   12/30/2020   atorvastatin (LIPITOR) 20 MG tablet Take 1 tablet by mouth daily.   12/29/2020   Cholecalciferol 25 MCG (1000 UT) capsule Take 1,000 Units by mouth daily.   12/29/2020   cloNIDine (CATAPRES) 0.1 MG tablet Take 1 tablet by mouth 2 (two) times daily.   12/30/2020   esomeprazole (NEXIUM) 40 MG capsule Take 1 capsule by mouth daily with breakfast.   12/30/2020   glimepiride (AMARYL) 2 MG tablet Take 2 mg by mouth daily.   12/30/2020   levothyroxine (SYNTHROID) 88 MCG tablet Take 88 mcg by mouth daily.   12/30/2020   losartan (COZAAR) 100 MG tablet Take 1 tablet (100 mg total) by mouth daily. 60 tablet 0 12/30/2020   metFORMIN (GLUCOPHAGE-XR) 500 MG 24 hr tablet Take 1,500 mg by mouth daily with breakfast.   12/30/2020   metoprolol tartrate (LOPRESSOR) 25 MG tablet Take 12.5 mg by mouth 2 (two) times daily.   12/30/2020 at 0830   sertraline (ZOLOFT) 100 MG tablet Take 1 tablet by mouth daily.   12/30/2020   amLODipine (NORVASC) 5 MG tablet Take 1 tablet (5 mg total) by mouth daily. 90 tablet 3    azithromycin (ZITHROMAX) 250 MG tablet Take 1 tablet (250 mg total) by mouth daily. Take first 2 tablets together, then 1 every day until finished. (Patient not taking: Reported on 12/30/2020) 6 tablet 0 Completed Course   diphenhydrAMINE (BENADRYL) 25 MG tablet Take 1 tablet (25 mg total) by mouth every 6 (six) hours as needed for allergies. (Patient not taking: Reported on 12/30/2020) 20 tablet 0 Completed Course   famotidine (PEPCID) 20 MG tablet Take 1 tablet (20 mg total)  by mouth 2 (two) times daily. (Patient not taking: Reported on 12/30/2020) 30 tablet 0 Completed Course   LORazepam (ATIVAN) 1 MG tablet Take 1 tablet (1 mg  total) by mouth 3 (three) times daily as needed for anxiety. (Patient not taking: Reported on 12/30/2020) 15 tablet 0 Completed Course   predniSONE (DELTASONE) 20 MG tablet 2 tabs po daily x 3 days (Patient not taking: Reported on 12/30/2020) 6 tablet 0 Completed Course     Vitals   Vitals:   12/30/20 1515 12/30/20 1530 12/30/20 1835 12/30/20 1900  BP:  (!) 144/66 (!) 154/70 (!) 157/80  Pulse: (!) 59 (!) 55 (!) 51 (!) 52  Resp: 18 18 16 17   Temp:      TempSrc:      SpO2: 97% 95% 97% 94%  Weight:      Height:         Body mass index is 32.03 kg/m.  Physical Exam   General: Laying comfortably in bed; in no acute distress. HENT: Normal oropharynx and mucosa. Normal external appearance of ears and nose.  Neck: Supple, no pain or tenderness CV: No JVD. No peripheral edema.  Pulmonary: Symmetric Chest rise. Normal respiratory effort. Abdomen: Soft to touch, non-tender. Ext: No cyanosis, edema, or deformity  Skin: No rash. Normal palpation of skin.   Musculoskeletal: Normal digits and nails by inspection. No clubbing.   Neurologic Examination  Mental status/Cognition: Alert, oriented to self, month but not place or year, good attention.  Speech/language: Fluent, comprehension intact, object naming intact, repetition intact.  Cranial nerves:   CN II Pupils equal and reactive to light, no VF deficits    CN III,IV,VI EOM intact, no gaze preference or deviation, no nystagmus   CN V normal sensation in V1, V2, and V3 segments bilaterally    CN VII no asymmetry, no nasolabial fold flattening    CN VIII normal hearing to speech    CN IX & X normal palatal elevation, no uvular deviation   CN XI 5/5 head turn and 5/5 shoulder shrug bilaterally    CN XII midline tongue protrusion    Motor:  Muscle bulk: poor, tone normal. Mvmt Root  Nerve  Muscle Right Left Comments  SA C5/6 Ax Deltoid 5 2   EF C5/6 Mc Biceps 5 1   EE C6/7/8 Rad Triceps 5 1   WF C6/7 Med FCR     WE C7/8 PIN ECU     F Ab C8/T1 U ADM/FDI 5 2   HF L1/2/3 Fem Illopsoas     KE L2/3/4 Fem Quad     DF L4/5 D Peron Tib Ant     PF S1/2 Tibial Grc/Sol      Reflexes:  Right Left Comments  Pectoralis      Biceps (C5/6) 2 2   Brachioradialis (C5/6) 2 2    Triceps (C6/7) 2 2    Patellar (L3/4) 2 2    Achilles (S1)      Hoffman      Plantar     Jaw jerk    Sensation:  Light touch intact   Pin prick    Temperature    Vibration   Proprioception    Coordination/Complex Motor:  - Finger to Nose intact in RUE - Heel to shin intact BL - Rapid alternating movement are normal in RUE - Gait: Deferred.  Labs   CBC:  Recent Labs  Lab 12/30/20 1039 12/30/20 1208  WBC 9.7  --   NEUTROABS 5.4  --   HGB 12.5 11.6*  HCT 39.7 34.0*  MCV 91.1  --   PLT 301  --     Basic Metabolic Panel:  Lab Results  Component Value Date   NA 139 12/30/2020   K 4.3 12/30/2020   CO2 25 12/30/2020   GLUCOSE 280 (H) 12/30/2020   BUN 23 12/30/2020   CREATININE 0.60 12/30/2020   CALCIUM 10.0 12/30/2020   GFRNONAA >60 12/30/2020   GFRAA >60 08/09/2018   Lipid Panel:  Lab Results  Component Value Date   LDLCALC 34 05/23/2015   HgbA1c:  Lab Results  Component Value Date   HGBA1C 7.2 (H) 07/24/2014   Urine Drug Screen: No results found for: LABOPIA, COCAINSCRNUR, LABBENZ, AMPHETMU, THCU, LABBARB  Alcohol Level     Component Value Date/Time   ETH <10 12/30/2020 1039   MR Angio head without contrast and MR angio neck: No LVO  MRI Brain: Patchy acute infarcts in the right frontoparietal lobes including the perirolandic region. Minimal right occipital involvement as well (right posterior communicating artery is present).  Impression   Jo Larson is a 87 y.o. female with PMH significant for DM2, HTN, HLD, CAD, hypothyroidism who presents with left  hand grip weakness with a LKW of 12/28/20.Marland Kitchen Found to have patchy Right frontoparietal infarcts including the perirolandic region. Her neurologic examination is notable for LUE flaccid with mild numbness to touch in LUE.  Primary Diagnosis:  Cerebral infarction due to embolism of  right middle cerebral artery.   Secondary Diagnosis: Essential (primary) hypertension and Type 2 diabetes mellitus w/o complications  Recommendations  Plan:  - Frequent Neuro checks per stroke unit protocol - Recommend brain imaging with MRI Brain without contrast - Recommend Vascular imaging with MRA Angio Head without contrast and US Carotid doppler - Recommend obtaining TTE - Recommend obtaining Lipid panel with LDL - Please start statin if LDL > 70 - Recommend HbA1c - Antithrombotic - Aspirin 81mg  daily along with plavix 75mg  daily x 21 days followed by aspirin 81mg  daily alone. - Recommend DVT ppx - SBP goal - permissive hypertension first 24 h < 220/110. Held home meds.  - Recommend Telemetry monitoring for arrythmia - Recommend bedside swallow screen prior to PO intake. - Stroke education booklet - Recommend PT/OT/SLP consult  ______________________________________________________________________   Thank you for the opportunity to take part in the care of this patient. If you have any further questions, please contact the neurology consultation attending.  Signed,  [facility] Pager Number _ _ _   _ __   _ __ _ _  __ __   _ __   __ _

## 2020-12-30 NOTE — ED Notes (Signed)
Pt transported to CT ?

## 2020-12-31 ENCOUNTER — Other Ambulatory Visit (HOSPITAL_COMMUNITY): Payer: Medicare Other

## 2020-12-31 ENCOUNTER — Inpatient Hospital Stay (HOSPITAL_COMMUNITY): Payer: Medicare Other

## 2020-12-31 DIAGNOSIS — I6389 Other cerebral infarction: Secondary | ICD-10-CM

## 2020-12-31 DIAGNOSIS — E039 Hypothyroidism, unspecified: Secondary | ICD-10-CM | POA: Diagnosis not present

## 2020-12-31 DIAGNOSIS — I10 Essential (primary) hypertension: Secondary | ICD-10-CM | POA: Diagnosis not present

## 2020-12-31 LAB — ECHOCARDIOGRAM COMPLETE
AR max vel: 1.89 cm2
AV Area VTI: 2.22 cm2
AV Area mean vel: 2.33 cm2
AV Mean grad: 5 mmHg
AV Peak grad: 12.5 mmHg
Ao pk vel: 1.77 m/s
Area-P 1/2: 2.75 cm2
Height: 60 in
S' Lateral: 2.4 cm
Weight: 2624 oz

## 2020-12-31 LAB — LIPID PANEL
Cholesterol: 153 mg/dL (ref 0–200)
HDL: 31 mg/dL — ABNORMAL LOW (ref >40)
LDL Cholesterol: 75 mg/dL (ref 0–99)
Total CHOL/HDL Ratio: 4.9 RATIO
Triglycerides: 235 mg/dL — ABNORMAL HIGH (ref <150)
VLDL: 47 mg/dL — ABNORMAL HIGH (ref 0–40)

## 2020-12-31 LAB — GLUCOSE, CAPILLARY
Glucose-Capillary: 117 mg/dL — ABNORMAL HIGH (ref 70–99)
Glucose-Capillary: 257 mg/dL — ABNORMAL HIGH (ref 70–99)
Glucose-Capillary: 210 mg/dL — ABNORMAL HIGH (ref 70–99)

## 2020-12-31 MED ORDER — AMLODIPINE BESYLATE 10 MG PO TABS
10.0000 mg | ORAL_TABLET | Freq: Every day | ORAL | Status: DC
  Administered 2021-01-01 – 2021-01-05 (×5): 10 mg via ORAL
  Filled 2020-12-31 (×5): qty 1

## 2020-12-31 MED ORDER — METOPROLOL TARTRATE 12.5 MG HALF TABLET
12.5000 mg | ORAL_TABLET | Freq: Two times a day (BID) | ORAL | Status: DC
  Administered 2020-12-31 – 2021-01-02 (×4): 12.5 mg via ORAL
  Filled 2020-12-31 (×4): qty 1

## 2020-12-31 MED ORDER — ASPIRIN EC 81 MG PO TBEC
81.0000 mg | DELAYED_RELEASE_TABLET | Freq: Every day | ORAL | Status: DC
  Administered 2021-01-01 – 2021-01-02 (×2): 81 mg via ORAL
  Filled 2020-12-31 (×2): qty 1

## 2020-12-31 MED ORDER — CLONIDINE HCL 0.1 MG PO TABS
0.1000 mg | ORAL_TABLET | Freq: Two times a day (BID) | ORAL | Status: DC
  Administered 2020-12-31 – 2021-01-05 (×9): 0.1 mg via ORAL
  Filled 2020-12-31 (×9): qty 1

## 2020-12-31 MED ORDER — LOSARTAN POTASSIUM 50 MG PO TABS
100.0000 mg | ORAL_TABLET | Freq: Every day | ORAL | Status: DC
  Administered 2021-01-01 – 2021-01-05 (×5): 100 mg via ORAL
  Filled 2020-12-31 (×5): qty 2

## 2020-12-31 MED ORDER — SERTRALINE HCL 50 MG PO TABS
100.0000 mg | ORAL_TABLET | Freq: Every day | ORAL | Status: DC
  Administered 2020-12-31 – 2021-01-05 (×6): 100 mg via ORAL
  Filled 2020-12-31 (×2): qty 1
  Filled 2020-12-31: qty 2
  Filled 2020-12-31: qty 1
  Filled 2020-12-31: qty 2
  Filled 2020-12-31: qty 1

## 2020-12-31 MED ORDER — ASPIRIN EC 81 MG PO TBEC: 81.0000 mg | DELAYED_RELEASE_TABLET | Freq: Every day | ORAL | Status: DC

## 2020-12-31 NOTE — Progress Notes (Signed)
PROGRESS NOTE  Jo Larson WUJ:[id number] DOB: September 29, 1931 DOA: 12/30/2020 PCP: Macy Mis, MD   LOS: 1 day   Brief narrative:  Jo Larson is a 87 y.o. female with medical history significant of hypertension, diabetes mellitus, hypothyroidism presented to hospital with 1 day history of left upper extremity weakness.  There was associated left hand grip weakness and could not hold the mug.  Patient was then brought into the hospital.  She also had feeling of off balance.  CT scan done in the ED showed evidence of a stroke.  Neurology was consulted and patient was admitted hospital for further evaluation and treatment.    Assessment/Plan:  Active Problems:   CVA (cerebral vascular accident) (HCC)   Acute CVA.     MRI of the brain showed acute infarcts in the right frontotemporal lobes including perioral and neck area.  MRA neck showed no proximal intracranial vessel occlusion.  Lipid profile reviewed with triglycerides elevated at 435 and HDL of 31 with LDL of 75. Hemoglobin A1c is pending.  Echocardiogram is pending   Diabetes mellitus type 2. Patient has passed a swallow evaluation.  Continue sliding scale insulin and diabetic diet.  Check A1c   Hypothyroidism     - continue home synthroid   Essential hypertension.     - on permissive HTN for now for 24 hours.  on amlodipine, losartan, clonidine and metoprolol at home.  Will resume medications.    DVT prophylaxis: SCD's Start: 12/30/20 2019   Code Status: Full code  Family Communication: Spoke with patient and grandson at bedside  Status is: Inpatient  Remains inpatient appropriate because:Ongoing diagnostic testing needed not appropriate for outpatient work up, IV treatments appropriate due to intensity of illness or inability to take PO, and Inpatient level of care appropriate due to severity of illness  Dispo: The patient is from: Home              Anticipated d/c is to:  Uncertain at this time               Patient currently is not medically stable to d/c.   Difficult to place patient No    Consultants: Neurology  Procedures: None  Anti-infectives:  None  Anti-infectives (From admission, onward)    None      Subjective: Today, patient was seen and examined at bedside.  Patient's grandson at bedside.  Spoke with patient with the help of her grandson.  Patient denies any dizziness, lightheadedness, shortness of breath, fever or chills.  Objective: Vitals:   12/31/20 0739 12/31/20 1203  BP: (!) 213/72 (!) 175/84  Pulse: (!) 59 67  Resp: 17 17  Temp: 98 F (36.7 C) 98.4 F (36.9 C)  SpO2: 98% 95%    Intake/Output Summary (Last 24 hours) at 12/31/2020 1523 Last data filed at 12/31/2020 0355 Gross per 24 hour  Intake 222.82 ml  Output 400 ml  Net -177.18 ml   Filed Weights   12/30/20 1059  Weight: 74.4 kg   Body mass index is 32.03 kg/m.   Physical Exam: GENERAL: Patient is alert awake and oriented. Not in obvious distress.  Obese HENT: No scleral pallor or icterus. Pupils equally reactive to light. Oral mucosa is moist NECK: is supple, no gross swelling noted. CHEST: Clear to auscultation. No crackles or wheezes.  Diminished breath sounds bilaterally. CVS: S1 and S2 heard, no murmur. Regular rate and rhythm.  ABDOMEN: Soft, non-tender, bowel sounds are present. EXTREMITIES: No edema. CNS: Cranial  nerves are intact.  Left upper extremity 3/5, left hand grip 3/5 SKIN: warm and dry without rashes.  Data Review: I have personally reviewed the following laboratory data and studies,  CBC: Recent Labs  Lab 12/30/20 1039 12/30/20 1208  WBC 9.7  --   NEUTROABS 5.4  --   HGB 12.5 11.6*  HCT 39.7 34.0*  MCV 91.1  --   PLT 301  --    Basic Metabolic Panel: Recent Labs  Lab 12/30/20 1039 12/30/20 1208  NA 140 139  K 4.3 4.3  CL 106 105  CO2 25  --   GLUCOSE 271* 280*  BUN 19 23  CREATININE 0.72 0.60  CALCIUM 10.0  --    Liver Function Tests: Recent  Labs  Lab 12/30/20 1039  AST 17  ALT 17  ALKPHOS 56  BILITOT 0.3  PROT 7.4  ALBUMIN 3.8   No results for input(s): LIPASE, AMYLASE in the last 168 hours. No results for input(s): AMMONIA in the last 168 hours. Cardiac Enzymes: No results for input(s): CKTOTAL, CKMB, CKMBINDEX, TROPONINI in the last 168 hours. BNP (last 3 results) No results for input(s): BNP in the last 8760 hours.  ProBNP (last 3 results) No results for input(s): PROBNP in the last 8760 hours.  CBG: Recent Labs  Lab 12/30/20 1846 12/30/20 1946 12/31/20 0636 12/31/20 1206  GLUCAP 134* 141* 117* 257*   Recent Results (from the past 240 hour(s))  Resp Panel by RT-PCR (Flu A&B, Covid) Nasopharyngeal Swab     Status: None   Collection Time: 12/30/20 12:53 PM   Specimen: Nasopharyngeal Swab; Nasopharyngeal(NP) swabs in vial transport medium  Result Value Ref Range Status   SARS Coronavirus 2 by RT PCR NEGATIVE NEGATIVE Final    Comment: (NOTE) SARS-CoV-2 target nucleic acids are NOT DETECTED.  The SARS-CoV-2 RNA is generally detectable in upper respiratory specimens during the acute phase of infection. The lowest concentration of SARS-CoV-2 viral copies this assay can detect is 138 copies/mL. A negative result does not preclude SARS-Cov-2 infection and should not be used as the sole basis for treatment or other patient management decisions. A negative result may occur with  improper specimen collection/handling, submission of specimen other than nasopharyngeal swab, presence of viral mutation(s) within the areas targeted by this assay, and inadequate number of viral copies(<138 copies/mL). A negative result must be combined with clinical observations, patient history, and epidemiological information. The expected result is Negative.  Fact Sheet for Patients:  BloggerCourse.com  Fact Sheet for Healthcare Providers:  SeriousBroker.it  This test is no t  yet approved or cleared by the [place] FDA and  has been authorized for detection and/or diagnosis of SARS-CoV-2 by FDA under an Emergency Use Authorization (EUA). This EUA will remain  in effect (meaning this test can be used) for the duration of the COVID-19 declaration under Section 564(b)(1) of the Act, 21 U.S.C.section 360bbb-3(b)(1), unless the authorization is terminated  or revoked sooner.       Influenza A by PCR NEGATIVE NEGATIVE Final   Influenza B by PCR NEGATIVE NEGATIVE Final    Comment: (NOTE) The Xpert Xpress SARS-CoV-2/FLU/RSV plus assay is intended as an aid in the diagnosis of influenza from Nasopharyngeal swab specimens and should not be used as a sole basis for treatment. Nasal washings and aspirates are unacceptable for Xpert Xpress SARS-CoV-2/FLU/RSV testing.  Fact Sheet for Patients: BloggerCourse.com  Fact Sheet for Healthcare Providers: SeriousBroker.it  This test is not yet approved or cleared by  the [place] FDA and has been authorized for detection and/or diagnosis of SARS-CoV-2 by FDA under an Emergency Use Authorization (EUA). This EUA will remain in effect (meaning this test can be used) for the duration of the COVID-19 declaration under Section 564(b)(1) of the Act, 21 U.S.C. section 360bbb-3(b)(1), unless the authorization is terminated or revoked.  Performed at [facility], [address] [place], [place] [address]      Studies: CT HEAD WO CONTRAST  Result Date: 12/30/2020 CLINICAL DATA:  Numbness and weakness, difficulty ambulating at baseline. Unable to move LEFT hand since 3 a.m. EXAM: CT HEAD WITHOUT CONTRAST TECHNIQUE: Contiguous axial images were obtained from the base of the skull through the vertex without intravenous contrast. COMPARISON:  June 12, 2020. FINDINGS: Brain: New area of hypoattenuation at the gray-white junction in the RIGHT posterior  frontal lobe (image 22/2) no sign of intracranial hemorrhage, mass, mass effect or midline shift. Extensive cerebral atrophy and chronic microvascular ischemic change. Vascular: No hyperdense vessel or unexpected calcification. Skull: Normal. Negative for fracture or focal lesion. Sinuses/Orbits: Visualized paranasal sinuses and orbits are unremarkable. Other: None IMPRESSION: New acute or subacute infarct at the high RIGHT posterior frontal lobe. No signs of hemorrhage, mass effect or midline shift. Stable findings of atrophy and chronic microvascular ischemic change. Critical Value/emergent results were called by telephone at the time of interpretation on 12/30/2020 at 12:50 pm to provider CLAUDIA GIBBONS , who verbally acknowledged these results. Electronically Signed   By: Donzetta KohutGeoffrey  Wile M.D.   On: 12/30/2020 12:47   MR ANGIO HEAD WO CONTRAST  Result Date: 12/30/2020 CLINICAL DATA:  Left hand weakness EXAM: MRI HEAD WITHOUT CONTRAST MRA HEAD WITHOUT CONTRAST MRA NECK WITHOUT CONTRAST TECHNIQUE: Multiplanar, multiecho pulse sequences of the brain and surrounding structures were obtained without intravenous contrast. Angiographic images of the Circle of Willis were obtained using MRA technique without intravenous contrast. Angiographic images of the neck were obtained using MRA technique without intravenous contrast. Carotid stenosis measurements (when applicable) are obtained utilizing NASCET criteria, using the distal internal carotid diameter as the denominator. COMPARISON:  None. FINDINGS: MRI HEAD Brain: There is patchy mildly reduced cortical/subcortical reduced diffusion in the right frontoparietal lobes including involvement of the perirolandic region. There is minimal right occipital lobe involvement as well. Prominence of the ventricles and sulci reflects generalized parenchymal volume loss. Patchy foci of T2 hyperintensity in the supratentorial and pontine white matter are nonspecific but may reflect  mild to moderate chronic microvascular ischemic changes. Foci of susceptibility in the left temporal lobe and left cerebellum likely reflect chronic microhemorrhages. There is no intracranial mass or mass effect. There is no hydrocephalus or extra-axial fluid collection. Vascular: Major vessel flow voids at the skull base are preserved. Skull and upper cervical spine: Normal marrow signal is preserved. Sinuses/Orbits: Paranasal sinuses are aerated. Orbits are unremarkable. Other: Sella is unremarkable.  Mastoid air cells are clear. MRA HEAD Intracranial internal carotid arteries are patent. Middle and anterior cerebral arteries are patent. Intracranial vertebral arteries, basilar artery, posterior cerebral arteries are patent. Right posterior communicating artery is present. A small left posterior communicating artery may be present. There is no significant stenosis or aneurysm. MRA NECK Motion artifact is present. Common, internal, and external carotid arteries are patent. Retropharyngeal course of the internal carotids. Visualized extracranial vertebral arteries are patent and codominant. There is no hemodynamically significant stenosis identified. IMPRESSION: Patchy acute infarcts in the right frontoparietal lobes including the perirolandic region. Minimal right occipital involvement  as well (right posterior communicating artery is present). Chronic microvascular ischemic changes. No proximal intracranial vessel occlusion. No hemodynamically significant stenosis in the neck. Electronically Signed   By: Guadlupe Spanish M.D.   On: 12/30/2020 17:43   MR ANGIO NECK WO CONTRAST  Result Date: 12/30/2020 CLINICAL DATA:  Left hand weakness EXAM: MRI HEAD WITHOUT CONTRAST MRA HEAD WITHOUT CONTRAST MRA NECK WITHOUT CONTRAST TECHNIQUE: Multiplanar, multiecho pulse sequences of the brain and surrounding structures were obtained without intravenous contrast. Angiographic images of the Circle of Willis were obtained using  MRA technique without intravenous contrast. Angiographic images of the neck were obtained using MRA technique without intravenous contrast. Carotid stenosis measurements (when applicable) are obtained utilizing NASCET criteria, using the distal internal carotid diameter as the denominator. COMPARISON:  None. FINDINGS: MRI HEAD Brain: There is patchy mildly reduced cortical/subcortical reduced diffusion in the right frontoparietal lobes including involvement of the perirolandic region. There is minimal right occipital lobe involvement as well. Prominence of the ventricles and sulci reflects generalized parenchymal volume loss. Patchy foci of T2 hyperintensity in the supratentorial and pontine white matter are nonspecific but may reflect mild to moderate chronic microvascular ischemic changes. Foci of susceptibility in the left temporal lobe and left cerebellum likely reflect chronic microhemorrhages. There is no intracranial mass or mass effect. There is no hydrocephalus or extra-axial fluid collection. Vascular: Major vessel flow voids at the skull base are preserved. Skull and upper cervical spine: Normal marrow signal is preserved. Sinuses/Orbits: Paranasal sinuses are aerated. Orbits are unremarkable. Other: Sella is unremarkable.  Mastoid air cells are clear. MRA HEAD Intracranial internal carotid arteries are patent. Middle and anterior cerebral arteries are patent. Intracranial vertebral arteries, basilar artery, posterior cerebral arteries are patent. Right posterior communicating artery is present. A small left posterior communicating artery may be present. There is no significant stenosis or aneurysm. MRA NECK Motion artifact is present. Common, internal, and external carotid arteries are patent. Retropharyngeal course of the internal carotids. Visualized extracranial vertebral arteries are patent and codominant. There is no hemodynamically significant stenosis identified. IMPRESSION: Patchy acute infarcts  in the right frontoparietal lobes including the perirolandic region. Minimal right occipital involvement as well (right posterior communicating artery is present). Chronic microvascular ischemic changes. No proximal intracranial vessel occlusion. No hemodynamically significant stenosis in the neck. Electronically Signed   By: Guadlupe Spanish M.D.   On: 12/30/2020 17:43   MR BRAIN WO CONTRAST  Result Date: 12/30/2020 CLINICAL DATA:  Left hand weakness EXAM: MRI HEAD WITHOUT CONTRAST MRA HEAD WITHOUT CONTRAST MRA NECK WITHOUT CONTRAST TECHNIQUE: Multiplanar, multiecho pulse sequences of the brain and surrounding structures were obtained without intravenous contrast. Angiographic images of the Circle of Willis were obtained using MRA technique without intravenous contrast. Angiographic images of the neck were obtained using MRA technique without intravenous contrast. Carotid stenosis measurements (when applicable) are obtained utilizing NASCET criteria, using the distal internal carotid diameter as the denominator. COMPARISON:  None. FINDINGS: MRI HEAD Brain: There is patchy mildly reduced cortical/subcortical reduced diffusion in the right frontoparietal lobes including involvement of the perirolandic region. There is minimal right occipital lobe involvement as well. Prominence of the ventricles and sulci reflects generalized parenchymal volume loss. Patchy foci of T2 hyperintensity in the supratentorial and pontine white matter are nonspecific but may reflect mild to moderate chronic microvascular ischemic changes. Foci of susceptibility in the left temporal lobe and left cerebellum likely reflect chronic microhemorrhages. There is no intracranial mass or mass effect. There is no hydrocephalus or  extra-axial fluid collection. Vascular: Major vessel flow voids at the skull base are preserved. Skull and upper cervical spine: Normal marrow signal is preserved. Sinuses/Orbits: Paranasal sinuses are aerated. Orbits are  unremarkable. Other: Sella is unremarkable.  Mastoid air cells are clear. MRA HEAD Intracranial internal carotid arteries are patent. Middle and anterior cerebral arteries are patent. Intracranial vertebral arteries, basilar artery, posterior cerebral arteries are patent. Right posterior communicating artery is present. A small left posterior communicating artery may be present. There is no significant stenosis or aneurysm. MRA NECK Motion artifact is present. Common, internal, and external carotid arteries are patent. Retropharyngeal course of the internal carotids. Visualized extracranial vertebral arteries are patent and codominant. There is no hemodynamically significant stenosis identified. IMPRESSION: Patchy acute infarcts in the right frontoparietal lobes including the perirolandic region. Minimal right occipital involvement as well (right posterior communicating artery is present). Chronic microvascular ischemic changes. No proximal intracranial vessel occlusion. No hemodynamically significant stenosis in the neck. Electronically Signed   By: Guadlupe Spanish M.D.   On: 12/30/2020 17:43      Joycelyn Das, MD  [facility] 12/31/2020  If 7PM-7AM, please contact night-coverage

## 2020-12-31 NOTE — Progress Notes (Signed)
SLP Cancellation Note  Patient Details Name: Jo Larson MRN: [id number] DOB: 07-24-1931   Cancelled treatment:       Reason Eval/Treat Not Completed: Other (comment) (no appropriate interpreter for speech/language evaluation) SLP attempted Cognitive-linguistic evaluation with patient. Unfortunately, iPad interpreter system was not loud enough for her to hear/communicate. Plan to hold evaluation until family is present to interpret. Per phone call with family made by OT, family feels her cognition and communication are at baseline.  Sophronia Varney P. Vera Wishart, M.S., CCC-SLP Speech-Language Pathologist Acute Rehabilitation Services Pager: [phone]   [name] Imoni Kohen 12/31/2020, 2:18 PM

## 2020-12-31 NOTE — Progress Notes (Signed)
  Echocardiogram 2D Echocardiogram has been performed.  Jo Larson 12/31/2020, 5:43 PM

## 2020-12-31 NOTE — Plan of Care (Signed)
  Problem: Education: Goal: Knowledge of disease or condition will improve Outcome: Progressing Goal: Knowledge of secondary prevention will improve Outcome: Progressing Goal: Knowledge of patient specific risk factors addressed and post discharge goals established will improve Outcome: Progressing Goal: Individualized Educational Video(s) Outcome: Progressing   Problem: Health Behavior/Discharge Planning: Goal: Ability to manage health-related needs will improve Outcome: Progressing   Problem: Self-Care: Goal: Ability to participate in self-care as condition permits will improve Outcome: Progressing Goal: Verbalization of feelings and concerns over difficulty with self-care will improve Outcome: Progressing Goal: Ability to communicate needs accurately will improve Outcome: Progressing   Problem: Nutrition: Goal: Risk of aspiration will decrease Outcome: Progressing

## 2020-12-31 NOTE — Evaluation (Signed)
Physical Therapy Evaluation Patient Details Name: Jo Larson MRN: [id number] DOB: 1931-10-14 Today's Date: 12/31/2020   History of Present Illness  This 87 y.o. female admitted with 1 day history of Lt UE weakness, and impaired balance.  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  PMH includes: angioedema, anxiety, CAD, DM,  Clinical Impression  Per daughter in law, patient was independent with mobility PTA and lives with her and two grandsons. Patient currently requires minA+2 for OOB mobility with HHAx2. Patient presents with impaired balance, L LE weakness, decreased activity tolerance, and L hemiparesis. Patient will benefit from skilled PT services during acute stay to address listed deficits. Recommend CIR at discharge for intensive therapies to assist with return to independence.     Follow Up Recommendations CIR    Equipment Recommendations  Other (comment) (TBD)    Recommendations for Other Services       Precautions / Restrictions Precautions Precautions: Fall Restrictions Weight Bearing Restrictions: No      Mobility  Bed Mobility Overal bed mobility: Needs Assistance Bed Mobility: Supine to Sit     Supine to sit: Min assist     General bed mobility comments: assist to scoot hips fully to EOB    Transfers Overall transfer level: Needs assistance Equipment used: 2 person hand held assist Transfers: Sit to/from [employer] Transfers Sit to Stand: Min assist;+2 physical assistance;+2 safety/equipment Stand pivot transfers: Min assist;+2 physical assistance;+2 safety/equipment       General transfer comment: Pt requires assist for balance and safety as language barrier impeded ability to communicate instructions accurately  Ambulation/Gait Ambulation/Gait assistance: Min assist;+2 physical assistance;+2 safety/equipment Gait Distance (Feet): 3 Feet Assistive device: 2 person hand held assist Gait Pattern/deviations:  Step-to pattern;Decreased stride length;Decreased step length - left;Decreased stance time - left;Decreased weight shift to left;Wide base of support Gait velocity: decreased   General Gait Details: Able to take steps fwd/bwd but fatigues quickly and returned to sitting prematurely. MinA+2 for balance and safety as language barrier impedes ability to communicate instructions accurately.  Stairs            Wheelchair Mobility    Modified Rankin (Stroke Patients Only) Modified Rankin (Stroke Patients Only) Pre-Morbid Rankin Score: Moderate disability Modified Rankin: Moderately severe disability     Balance Overall balance assessment: Needs assistance Sitting-balance support: No upper extremity supported Sitting balance-Leahy Scale: Fair     Standing balance support: Single extremity supported Standing balance-Leahy Scale: Poor Standing balance comment: requires UE support                             Pertinent Vitals/Pain Pain Assessment: Faces Faces Pain Scale: No hurt    Home Living Family/patient expects to be discharged to:: Private residence Living Arrangements: Other relatives Available Help at Discharge: Family;Available 24 hours/day Type of Home: House Home Access: Stairs to enter   [employer] of Steps: 4-5 Home Layout: One level Home Equipment: None Additional Comments: Pt lives with her daughter in law, 2 grandsons, and grand daughter who provide 24 hour assist    Prior Function Level of Independence: Needs assistance   Gait / Transfers Assistance Needed: Daughter in law report pt ambulated without AE, and has no history of falls  ADL's / Homemaking Assistance Needed: Daughter in law reports pt was mod I with all ADLs except showering        Hand Dominance   Dominant Hand:  Left    Extremity/Trunk Assessment   Upper Extremity Assessment Upper Extremity Assessment: Defer to OT evaluation LUE Deficits / Details: mild flexor  synergy of Lt UE - Brunnstrom stage 5.  Pt with minimal flexion extension actively of Lt hand LUE Coordination: decreased gross motor;decreased fine motor    Lower Extremity Assessment Lower Extremity Assessment: Generalized weakness;LLE deficits/detail LLE Deficits / Details: functional assessment with patient able to ambulate with no knee buckling but fatigues quickly. Difficult to assess due to language barrier LLE Coordination: decreased gross motor    Cervical / Trunk Assessment Cervical / Trunk Assessment: Normal  Communication   Communication: Prefers language other than English (attempted to use audio interpreter line (no video interpreter present).  the connection was poor and it was very difficult for the pt to hear the interpreter.  The call lost connection.  Therefore, PT/OT used gestures to assess pt's function.)  Cognition Arousal/Alertness: Awake/alert Behavior During Therapy: WFL for tasks assessed/performed Overall Cognitive Status: Difficult to assess                                 General Comments: unable to assess cognition accurately due to language barrier. Pt followed gestural cues consistently.  Daughter in law was called at the end of the session and reports that pt's cognition and language appear at baseline      General Comments General comments (skin integrity, edema, etc.): OT phoned and spoke with pt's daughter in law to establish pt baseline and home set up as well as to relay results of eval findings.  Daughter in law agrees with recommendation for CIR    Exercises     Assessment/Plan    PT Assessment Patient needs continued PT services  PT Problem List Decreased strength;Decreased activity tolerance;Decreased balance;Decreased mobility;Decreased coordination;Decreased safety awareness       PT Treatment Interventions DME instruction;Gait training;Stair training;Functional mobility training;Therapeutic exercise;Therapeutic  activities;Balance training;Neuromuscular re-education;Patient/family education    PT Goals (Current goals can be found in the Care Plan section)  Acute Rehab PT Goals Patient Stated Goal: unable to state due to language barrier PT Goal Formulation: With family Time For Goal Achievement: 01/14/21 Potential to Achieve Goals: Good    Frequency Min 4X/week   Barriers to discharge        Co-evaluation PT/OT/SLP Co-Evaluation/Treatment: Yes Reason for Co-Treatment: For patient/therapist safety;To address functional/ADL transfers PT goals addressed during session: Mobility/safety with mobility;Balance OT goals addressed during session: ADL's and self-care       AM-PAC PT "6 Clicks" Mobility  Outcome Measure Help needed turning from your back to your side while in a flat bed without using bedrails?: A Little Help needed moving from lying on your back to sitting on the side of a flat bed without using bedrails?: A Little Help needed moving to and from a bed to a chair (including a wheelchair)?: A Little Help needed standing up from a chair using your arms (e.g., wheelchair or bedside chair)?: Total Help needed to walk in hospital room?: Total Help needed climbing 3-5 steps with a railing? : Total 6 Click Score: 12    End of Session   Activity Tolerance: Patient tolerated treatment well Patient left: in bed;with call bell/phone within reach;with bed alarm set Nurse Communication: Mobility status PT Visit Diagnosis: Unsteadiness on feet (R26.81);Muscle weakness (generalized) (M62.81);Other abnormalities of gait and mobility (R26.89);Ataxic gait (R26.0)    Time: 1314-1340 PT Time Calculation (min) (  ACUTE ONLY): 26 min   Charges:   PT Evaluation $PT Eval Moderate Complexity: 1 Mod          Jana Swartzlander A. Dan Humphreys PT, DPT Acute Rehabilitation Services Pager [phone] Office [phone]   Viviann Spare 12/31/2020, 5:29 PM

## 2020-12-31 NOTE — Progress Notes (Addendum)
STROKE TEAM PROGRESS NOTE   INTERVAL HISTORY  The patient presented with left sided weakness and pain for the last couple of days. Per report, her weakness progressed and she was unable to move her left arm so she was brought in to the ED. MRI brain was obtained and notable for patchy R frontoparietal lobe infarcts including the perirolandic region.   Vitals:   12/31/20 0436 12/31/20 0739 12/31/20 1203 12/31/20 1623  BP: (!) 166/85 (!) 213/72 (!) 175/84 (!) 171/74  Pulse: (!) 59 (!) 59 67 71  Resp: 19 17 17 18   Temp: (!) 97.4 F (36.3 C) 98 F (36.7 C) 98.4 F (36.9 C) 98.1 F (36.7 C)  TempSrc: Temporal Oral Oral Oral  SpO2: 97% 98% 95% 94%  Weight:      Height:       CBC:  Recent Labs  Lab 12/30/20 1039 12/30/20 1208  WBC 9.7  --   NEUTROABS 5.4  --   HGB 12.5 11.6*  HCT 39.7 34.0*  MCV 91.1  --   PLT 301  --    Basic Metabolic Panel:  Recent Labs  Lab 12/30/20 1039 12/30/20 1208  NA 140 139  K 4.3 4.3  CL 106 105  CO2 25  --   GLUCOSE 271* 280*  BUN 19 23  CREATININE 0.72 0.60  CALCIUM 10.0  --    Lipid Panel:  Recent Labs  Lab 12/31/20 0301  CHOL 153  TRIG 235*  HDL 31*  CHOLHDL 4.9  VLDL 47*  LDLCALC 75   HgbA1c: No results for input(s): HGBA1C in the last 168 hours. Urine Drug Screen: No results for input(s): LABOPIA, COCAINSCRNUR, LABBENZ, AMPHETMU, THCU, LABBARB in the last 168 hours.  Alcohol Level  Recent Labs  Lab 12/30/20 1039  ETH <10    IMAGING past 24 hours  CT HEAD WO CONTRAST Result Date: 12/30/2020 IMPRESSION: New acute or subacute infarct at the high RIGHT posterior frontal lobe. No signs of hemorrhage, mass effect or midline shift. Stable findings of atrophy and chronic microvascular ischemic change.   MR ANGIO HEAD WO CONTRAST Result Date: 12/30/2020 IMPRESSION: Patchy acute infarcts in the right frontoparietal lobes including the perirolandic region. Minimal right occipital involvement as well (right posterior  communicating artery is present). Chronic microvascular ischemic changes. No proximal intracranial vessel occlusion. No hemodynamically significant stenosis in the neck.   MR ANGIO NECK WO CONTRAST Result Date: 12/30/2020 IMPRESSION: Patchy acute infarcts in the right frontoparietal lobes including the perirolandic region. Minimal right occipital involvement as well (right posterior communicating artery is present). Chronic microvascular ischemic changes. No proximal intracranial vessel occlusion. No hemodynamically significant stenosis in the neck.   MR BRAIN WO CONTRAST  Result Date: 12/30/2020 IMPRESSION: Patchy acute infarcts in the right frontoparietal lobes including the perirolandic region. Minimal right occipital involvement as well (right posterior communicating artery is present). Chronic microvascular ischemic changes. No proximal intracranial vessel occlusion. No hemodynamically significant stenosis in the neck.   PHYSICAL EXAM GENERAL: Patient is alert awake and oriented. Not in obvious distress.   HENT: Pupils equally reactive to light. Oral mucosa is moist NECK: is supple CHEST: Clear to auscultation. No crackles or wheezes.  Diminished breath sounds bilaterally. CVS: S1 and S2 heard, no murmur. Regular rate and rhythm. ABDOMEN: Soft, non-tender, bowel sounds are present. EXTREMITIES: No edema. SKIN: warm and dry without rashes.  Motor: Normal bulk and tone. Left upper extremity drift. Decreased fine motor movements to the left arm.  Moderate weakness  of left grip and left ankle dorsiflexors.   Dlt Bic Tri FgS Grp HF  KnF KnE PIF DoF  R 5 5 5 5 5 5 5 5 5 5   L 3 3 3 3 3 4 4 4 4 4   Sensory: Intact to light touch on the right. Decreased on the left upper extremity Coordination: Intact FNF Gait: Deferred  ASSESSMENT/PLAN Jo Larson is a 87 y.o. female with history of DM2, HTN, HLD, CAD, hypothyroidism who presents with left hand grip weakness with a LKW of  12/28/20.  Stroke:  right frontoparietal lobes including the perirolandic region acute infarct.  CT head: new acute vs subacute infarct at the high right posterior frontal lobe   MRI: patchy acute infarcts in the right frontoparietal lobes including the perirolandic region. Minimal right occipital involvement  MRA: no significant stenosis in the neck 2D Echo Pending LDL 75 HgbA1c pending VTE prophylaxis - SCDs Diet: heart healthy  No antiplatelet prior to admission, now on aspirin 325 mg daily and clopidogrel 75 mg daily. Continue DAPT Aspirin 81mg  and clopidogrel 75mg  for 21 days then aspirin alone Therapy recommendations:  CIR Disposition:  pending  Hypertension Home meds:  Norvasc 10mg  daily, catepres 0.1mg  bid, cozaar 100mg  daily, lopressor 12.5mg  bid stable Permissive hypertension (OK if < 220/120) but gradually normalize in 5-7 days Long-term BP goal normotensive  Hyperlipidemia Home meds:  Lipitor 20mg  daily LDL 75, goal < 70 Add Lipitor 40mg  daily  Continue statin at discharge  Diabetes type II Controlled Home meds:  glimerpiride 2mg  daily, metformin 1500mg  daily HgbA1c Pending, goal < 7.0 CBGs Recent Labs    12/31/20 0636 12/31/20 1206 12/31/20 1624  GLUCAP 117* 257* 210*    SSI  Other Stroke Risk Factors Advanced Age >/= 65  Obesity, Body mass index is 32.03 kg/m., BMI >/= 30 associated with increased stroke risk, recommend weight loss, diet and exercise as appropriate  Coronary artery disease: 07/2014 NSTEMI/PCL: LM nl, LAD min irregs, LCX 87m (4.0x18 Xience DES), LPDA 90 (5mm vessel->med Rx), RCA nl, EF 60%.  Other Active Problems Hypothyroidism: Synthroid daily Anxiety: zoloft  Hospital day # 1  Jo Larson, ACNP-BC   To contact Stroke Continuity provider, please refer to . After hours, contact General Neurology

## 2020-12-31 NOTE — Evaluation (Signed)
Occupational Therapy Evaluation Patient Details Name: Jo Larson MRN: [id number] DOB: 02/25/1932 Today's Date: 12/31/2020    History of Present Illness This 87 y.o. female admitted with 1 day history of Lt UE weakness, and impaired balance.  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  PMH includes: angioedema, anxiety, CAD, DM,   Clinical Impression   Pt admitted with above. She demonstrates the below listed deficits and will benefit from continued OT to maximize safety and independence with BADLs.  Pt presents to OT with Lt hemiparesis, impaired balance, decreased activity tolerance, impaired FMC.  Pt requires min A +2 for functional mobility and mod A for ADLs.  Per her daughter in law, pt was independent with all ADLs except showering and was independent with ambulation PTA.  Pt lives with daughter in law and two grandsons who provide 24 hour supervision.  Recommend CIR.      Follow Up Recommendations  CIR    Equipment Recommendations  None recommended by OT    Recommendations for Other Services Rehab consult     Precautions / Restrictions Precautions Precautions: Fall      Mobility Bed Mobility Overal bed mobility: Needs Assistance Bed Mobility: Supine to Sit     Supine to sit: Min assist     General bed mobility comments: assist to scoot hips fully to EOB    Transfers Overall transfer level: Needs assistance Equipment used: 2 person hand held assist Transfers: Sit to/from [employer] Transfers Sit to Stand: Min assist;+2 physical assistance;+2 safety/equipment Stand pivot transfers: Min assist;+2 physical assistance;+2 safety/equipment       General transfer comment: Pt requires assist for balance and safety as language barrier impeded ability to communicate instructions accurately    Balance Overall balance assessment: Needs assistance Sitting-balance support: No upper extremity supported Sitting balance-Leahy Scale:  Fair     Standing balance support: Single extremity supported Standing balance-Leahy Scale: Poor Standing balance comment: requires UE support                           ADL either performed or assessed with clinical judgement   ADL Overall ADL's : Needs assistance/impaired Eating/Feeding: Modified independent;Bed level   Grooming: Wash/dry hands;Wash/dry face;Oral care;Brushing hair;Minimal assistance;Standing;Sitting   Upper Body Bathing: Minimal assistance;Sitting   Lower Body Bathing: Moderate assistance;Sit to/from stand   Upper Body Dressing : Moderate assistance;Sitting   Lower Body Dressing: Moderate assistance;Sit to/from stand Lower Body Dressing Details (indicate cue type and reason): mod A to don socks.  Requires therapist to start the socks over her toes, then pt able to take over Toilet Transfer: Minimal assistance;+2 for safety/equipment;+2 for physical assistance;Stand-pivot;BSC   Toileting- Clothing Manipulation and Hygiene: Maximal assistance;Sit to/from stand       Functional mobility during ADLs: Minimal assistance;+2 for physical assistance;+2 for safety/equipment       Vision   Additional Comments: unable to accurately assess     Perception Perception Comments: difficult to accurately assess due to language barrier   Praxis Praxis Praxis tested?: Within functional limits    Pertinent Vitals/Pain Pain Assessment: Faces Faces Pain Scale: No hurt     Hand Dominance Left   Extremity/Trunk Assessment Upper Extremity Assessment Upper Extremity Assessment: LUE deficits/detail LUE Deficits / Details: mild flexor synergy of Lt UE - Brunnstrom stage 5.  Pt with minimal flexion extension actively of Lt hand LUE Coordination: decreased gross motor;decreased fine motor   Lower Extremity  Assessment Lower Extremity Assessment: Defer to PT evaluation   Cervical / Trunk Assessment Cervical / Trunk Assessment: Normal   Communication  Communication Communication: Prefers language other than English (attempted to use audio interpreter line (no video interpreter present).  the connection was poor and it was very difficult for the pt to hear the interpreter.  The call lost connection.  Therefore, PT/OT used gestures to assess pt's function.)   Cognition Arousal/Alertness: Awake/alert Behavior During Therapy: WFL for tasks assessed/performed Overall Cognitive Status: Difficult to assess                                 General Comments: unable to assess cognition accurately due to language barrier. Pt followed gestural cues consistently.  Daughter in law was called at the end of the session and reports that pt's cognition and language appear at baseline   General Comments  OT phoned and spoke with pt's daughter in law to establish pt baseline and home set up as well as to relay results of eval findings.  Daughter in law agrees with recommendation for CIR    Exercises     Shoulder Instructions      Home Living Family/patient expects to be discharged to:: Private residence Living Arrangements: Other relatives Available Help at Discharge: Family;Available 24 hours/day Type of Home: House Home Access: Stairs to enter [employer] of Steps: 4-5   Home Layout: One level     Bathroom Shower/Tub: Chief Strategy Officer: Standard     Home Equipment: None   Additional Comments: Pt lives with her daughter in law, 2 grandsons, and grand daughter who provide 24 hour assist      Prior Functioning/Environment Level of Independence: Needs assistance  Gait / Transfers Assistance Needed: Daughter in law report pt ambulated without AE, and has no history of falls ADL's / Homemaking Assistance Needed: Daughter in law reports pt was mod I with all ADLs except showering            OT Problem List: Decreased strength;Decreased range of motion;Impaired balance (sitting and/or  standing);Decreased coordination;Impaired UE functional use      OT Treatment/Interventions:      OT Goals(Current goals can be found in the care plan section) Acute Rehab OT Goals Patient Stated Goal: unable to state due to language barrier OT Goal Formulation: With family Time For Goal Achievement: 01/14/21 Potential to Achieve Goals: Good ADL Goals Pt Will Perform Grooming: with min guard assist;standing Pt Will Perform Upper Body Bathing: with supervision;with set-up;sitting Pt Will Perform Lower Body Bathing: with min assist;sit to/from stand Pt Will Perform Upper Body Dressing: with set-up;with supervision;sitting Pt Will Perform Lower Body Dressing: with min assist;sit to/from stand Pt Will Transfer to Toilet: with min guard assist;ambulating;grab bars;regular height toilet Pt Will Perform Toileting - Clothing Manipulation and hygiene: with min assist;sit to/from stand Additional ADL Goal #1: Pt will be able to reach for and retrieve cylindrical object with Lt UE and min facilitation.  OT Frequency: Min 2X/week   Barriers to D/C:            Co-evaluation PT/OT/SLP Co-Evaluation/Treatment: Yes Reason for Co-Treatment: For patient/therapist safety;To address functional/ADL transfers   OT goals addressed during session: ADL's and self-care      AM-PAC OT "6 Clicks" Daily Activity     Outcome Measure Help from another person eating meals?: None Help from another person taking care of personal grooming?:  A Little Help from another person toileting, which includes using toliet, bedpan, or urinal?: A Lot Help from another person bathing (including washing, rinsing, drying)?: A Lot Help from another person to put on and taking off regular upper body clothing?: A Lot Help from another person to put on and taking off regular lower body clothing?: A Lot 6 Click Score: 15   End of Session Nurse Communication: Mobility status  Activity Tolerance: Patient tolerated treatment  well Patient left: in bed;with call bell/phone within reach;with bed alarm set  OT Visit Diagnosis: Unsteadiness on feet (R26.81);Hemiplegia and hemiparesis Hemiplegia - Right/Left: Left Hemiplegia - dominant/non-dominant: Dominant Hemiplegia - caused by: Cerebral infarction                Time: 1610-9604 OT Time Calculation (min): 55 min Charges:  OT General Charges $OT Visit: 1 Visit OT Evaluation $OT Eval Moderate Complexity: 1 Mod OT Treatments $Self Care/Home Management : 8-22 mins  Eber Jones., OTR/L Acute Rehabilitation Services Pager [phone] Office [phone]   Jeani Hawking M 12/31/2020, 3:34 PM

## 2021-01-01 DIAGNOSIS — I639 Cerebral infarction, unspecified: Secondary | ICD-10-CM

## 2021-01-01 DIAGNOSIS — I10 Essential (primary) hypertension: Secondary | ICD-10-CM | POA: Diagnosis not present

## 2021-01-01 LAB — GLUCOSE, CAPILLARY
Glucose-Capillary: 241 mg/dL — ABNORMAL HIGH (ref 70–99)
Glucose-Capillary: 405 mg/dL — ABNORMAL HIGH (ref 70–99)
Glucose-Capillary: 467 mg/dL — ABNORMAL HIGH (ref 70–99)
Glucose-Capillary: 440 mg/dL — ABNORMAL HIGH (ref 70–99)
Glucose-Capillary: 361 mg/dL — ABNORMAL HIGH (ref 70–99)
Glucose-Capillary: 335 mg/dL — ABNORMAL HIGH (ref 70–99)
Glucose-Capillary: 109 mg/dL — ABNORMAL HIGH (ref 70–99)

## 2021-01-01 LAB — BASIC METABOLIC PANEL
Anion gap: 7 (ref 5–15)
BUN: 14 mg/dL (ref 8–23)
CO2: 24 mmol/L (ref 22–32)
Calcium: 9.7 mg/dL (ref 8.9–10.3)
Chloride: 107 mmol/L (ref 98–111)
Creatinine, Ser: 0.62 mg/dL (ref 0.44–1.00)
GFR, Estimated: >60 mL/min (ref >60)
Glucose, Bld: 161 mg/dL — ABNORMAL HIGH (ref 70–99)
Potassium: 4.1 mmol/L (ref 3.5–5.1)
Sodium: 138 mmol/L (ref 135–145)

## 2021-01-01 MED ORDER — INSULIN ASPART 100 UNIT/ML IJ SOLN
5.0000 [IU] | Freq: Once | INTRAMUSCULAR | Status: AC
  Administered 2021-01-01: 5 [IU] via SUBCUTANEOUS

## 2021-01-01 MED ORDER — INSULIN ASPART 100 UNIT/ML IJ SOLN
0.0000 [IU] | Freq: Every day | INTRAMUSCULAR | Status: DC
  Administered 2021-01-04: 3 [IU] via SUBCUTANEOUS

## 2021-01-01 MED ORDER — INSULIN ASPART 100 UNIT/ML IJ SOLN
0.0000 [IU] | Freq: Three times a day (TID) | INTRAMUSCULAR | Status: DC
  Administered 2021-01-01 – 2021-01-02 (×2): 11 [IU] via SUBCUTANEOUS
  Administered 2021-01-02: 5 [IU] via SUBCUTANEOUS
  Administered 2021-01-03 (×2): 3 [IU] via SUBCUTANEOUS
  Administered 2021-01-04: 11 [IU] via SUBCUTANEOUS
  Administered 2021-01-04: 15 [IU] via SUBCUTANEOUS
  Administered 2021-01-04: 8 [IU] via SUBCUTANEOUS
  Administered 2021-01-05 (×2): 2 [IU] via SUBCUTANEOUS

## 2021-01-01 NOTE — Progress Notes (Signed)
Pt. CBG 409, prior to lunch,  MD advised to give 10 unit SSI , RN rechecked CBG prior to administering insulin it was 467. Insulin given, will recheck in 30 min. For effectiveness. Patient alert and oriented, told me she was sleepy and said thank you, resting with eyes open, folding her napkin. Will monitor.

## 2021-01-01 NOTE — Progress Notes (Signed)
PROGRESS NOTE    Jo Larson  KAJ:[id number] DOB: 1932-02-09 DOA: 12/30/2020 PCP: Macy Mis, MD  Chief Complaint  Patient presents with   Extremity Weakness    Left hand    Brief Narrative:  87 year old lady prior history of hypertension diabetes mellitus, hypothyroidism presents to ED with left upper extremity weakness.  MRI of the brain showed patchy acute infarcts in the right frontotemporal lobes including the perirolandic region.  Minimal right occipital involvement.  MRA of the head and neck does not show any significant stenosis.  Neurology on board and appreciate recommendations   Assessment & Plan:   Active Problems:   Hypothyroidism   Type 2 diabetes mellitus without complication, without long-term current use of insulin (HCC)   Hyperlipidemia   ANXIETY DISORDER, GENERALIZED   HYPERTENSION, BENIGN ESSENTIAL   Coronary artery disease involving native coronary artery of native heart without angina pectoris   CVA (cerebral vascular accident) (HCC)   Right frontoparietal lobe infarct MRA of the head and neck does not show any significant stenosis.  Echocardiogram shows preserved left ventricular ejection fraction, no regional wall abnormalities of the left ventricle.  Grade 1 diastolic dysfunction seen. LDL is 75, hemoglobin A1c is pending.  Neurology on board recommends aspirin 81 mg and Plavix 75 mg for 21 days followed by aspirin alone. Continue with Lipitor 40 mg daily Therapy evaluations recommending CIR.     Essential hypertension Patient is on clonidine 0.1 mg twice daily, Norvasc 10 mg daily, metoprolol 12.5 mg twice daily, losartan 100 mg daily.    Type 2 diabetes mellitus Non-insulin-dependent, patient on metformin 1500 mg daily at home Hemoglobin A1c is pending, continue with sliding scale insulin.   - Hypothyroidism Continue with Synthroid 88 MCG daily.   Anxiety Resume home medications.    DVT prophylaxis: (Lovenox) Code Status: (  full code.  Family Communication: none at bedside.  Disposition:   Status is: Inpatient  Remains inpatient appropriate because:Ongoing diagnostic testing needed not appropriate for outpatient work up  Dispo: The patient is from: Home              Anticipated d/c is to:  pending              Patient currently is not medically stable to d/c.   Difficult to place patient No       Consultants:  neurology.  Procedures: Echocardiogram  Antimicrobials: none.    Subjective: No chest pain or sob, no nausea, vomiting or abdominal pain.   Objective: Vitals:   01/01/21 0002 01/01/21 0442 01/01/21 0746 01/01/21 0751  BP: (!) 166/97 (!) 183/96 (!) 215/184 (!) 209/76  Pulse: 76 70 94 75  Resp: 16 18  18   Temp: 98.6 F (37 C) 98.2 F (36.8 C) 97.9 F (36.6 C)   TempSrc: Oral Oral Oral   SpO2: 97% 100% 99%   Weight:      Height:        Intake/Output Summary (Last 24 hours) at 01/01/2021 0932 Last data filed at 01/01/2021 0442 Gross per 24 hour  Intake 150 ml  Output 700 ml  Net -550 ml   Filed Weights   12/30/20 1059  Weight: 74.4 kg    Examination:  General exam: Appears calm and comfortable  Respiratory system: Clear to auscultation. Respiratory effort normal. Cardiovascular system: S1 & S2 heard, RRR.  No pedal edema. Gastrointestinal system: Abdomen is nondistended, soft and nontender. Normal bowel sounds heard. Central nervous system: Alert and oriented. Left sided  weakness.  Extremities: Symmetric 5 x 5 power. Skin: No rashes, lesions or ulcers Psychiatry: Mood & affect appropriate.     Data Reviewed: I have personally reviewed following labs and imaging studies  CBC: Recent Labs  Lab 12/30/20 1039 12/30/20 1208  WBC 9.7  --   NEUTROABS 5.4  --   HGB 12.5 11.6*  HCT 39.7 34.0*  MCV 91.1  --   PLT 301  --     Basic Metabolic Panel: Recent Labs  Lab 12/30/20 1039 12/30/20 1208  NA 140 139  K 4.3 4.3  CL 106 105  CO2 25  --   GLUCOSE 271*  280*  BUN 19 23  CREATININE 0.72 0.60  CALCIUM 10.0  --     GFR: Estimated Creatinine Clearance: 44.7 mL/min (by C-G formula based on SCr of 0.6 mg/dL).  Liver Function Tests: Recent Labs  Lab 12/30/20 1039  AST 17  ALT 17  ALKPHOS 56  BILITOT 0.3  PROT 7.4  ALBUMIN 3.8    CBG: Recent Labs  Lab 12/30/20 1946 12/31/20 0636 12/31/20 1206 12/31/20 1624 01/01/21 0556  GLUCAP 141* 117* 257* 210* 241*     Recent Results (from the past 240 hour(s))  Resp Panel by RT-PCR (Flu A&B, Covid) Nasopharyngeal Swab     Status: None   Collection Time: 12/30/20 12:53 PM   Specimen: Nasopharyngeal Swab; Nasopharyngeal(NP) swabs in vial transport medium  Result Value Ref Range Status   SARS Coronavirus 2 by RT PCR NEGATIVE NEGATIVE Final    Comment: (NOTE) SARS-CoV-2 target nucleic acids are NOT DETECTED.  The SARS-CoV-2 RNA is generally detectable in upper respiratory specimens during the acute phase of infection. The lowest concentration of SARS-CoV-2 viral copies this assay can detect is 138 copies/mL. A negative result does not preclude SARS-Cov-2 infection and should not be used as the sole basis for treatment or other patient management decisions. A negative result may occur with  improper specimen collection/handling, submission of specimen other than nasopharyngeal swab, presence of viral mutation(s) within the areas targeted by this assay, and inadequate number of viral copies(<138 copies/mL). A negative result must be combined with clinical observations, patient history, and epidemiological information. The expected result is Negative.  Fact Sheet for Patients:  BloggerCourse.com  Fact Sheet for Healthcare Providers:  SeriousBroker.it  This test is no t yet approved or cleared by the [place] FDA and  has been authorized for detection and/or diagnosis of SARS-CoV-2 by FDA under an Emergency Use Authorization  (EUA). This EUA will remain  in effect (meaning this test can be used) for the duration of the COVID-19 declaration under Section 564(b)(1) of the Act, 21 U.S.C.section 360bbb-3(b)(1), unless the authorization is terminated  or revoked sooner.       Influenza A by PCR NEGATIVE NEGATIVE Final   Influenza B by PCR NEGATIVE NEGATIVE Final    Comment: (NOTE) The Xpert Xpress SARS-CoV-2/FLU/RSV plus assay is intended as an aid in the diagnosis of influenza from Nasopharyngeal swab specimens and should not be used as a sole basis for treatment. Nasal washings and aspirates are unacceptable for Xpert Xpress SARS-CoV-2/FLU/RSV testing.  Fact Sheet for Patients: BloggerCourse.com  Fact Sheet for Healthcare Providers: SeriousBroker.it  This test is not yet approved or cleared by the [place] FDA and has been authorized for detection and/or diagnosis of SARS-CoV-2 by FDA under an Emergency Use Authorization (EUA). This EUA will remain in effect (meaning this test can be used) for the duration of the  COVID-19 declaration under Section 564(b)(1) of the Act, 21 U.S.C. section 360bbb-3(b)(1), unless the authorization is terminated or revoked.  Performed at [facility], [address], [place], [place] [address]          Radiology Studies: CT HEAD WO CONTRAST  Result Date: 12/30/2020 CLINICAL DATA:  Numbness and weakness, difficulty ambulating at baseline. Unable to move LEFT hand since 3 a.m. EXAM: CT HEAD WITHOUT CONTRAST TECHNIQUE: Contiguous axial images were obtained from the base of the skull through the vertex without intravenous contrast. COMPARISON:  June 12, 2020. FINDINGS: Brain: New area of hypoattenuation at the gray-white junction in the RIGHT posterior frontal lobe (image 22/2) no sign of intracranial hemorrhage, mass, mass effect or midline shift. Extensive cerebral atrophy and chronic  microvascular ischemic change. Vascular: No hyperdense vessel or unexpected calcification. Skull: Normal. Negative for fracture or focal lesion. Sinuses/Orbits: Visualized paranasal sinuses and orbits are unremarkable. Other: None IMPRESSION: New acute or subacute infarct at the high RIGHT posterior frontal lobe. No signs of hemorrhage, mass effect or midline shift. Stable findings of atrophy and chronic microvascular ischemic change. Critical Value/emergent results were called by telephone at the time of interpretation on 12/30/2020 at 12:50 pm to provider CLAUDIA GIBBONS , who verbally acknowledged these results. Electronically Signed   By: Donzetta Kohut M.D.   On: 12/30/2020 12:47   MR ANGIO HEAD WO CONTRAST  Result Date: 12/30/2020 CLINICAL DATA:  Left hand weakness EXAM: MRI HEAD WITHOUT CONTRAST MRA HEAD WITHOUT CONTRAST MRA NECK WITHOUT CONTRAST TECHNIQUE: Multiplanar, multiecho pulse sequences of the brain and surrounding structures were obtained without intravenous contrast. Angiographic images of the Circle of Willis were obtained using MRA technique without intravenous contrast. Angiographic images of the neck were obtained using MRA technique without intravenous contrast. Carotid stenosis measurements (when applicable) are obtained utilizing NASCET criteria, using the distal internal carotid diameter as the denominator. COMPARISON:  None. FINDINGS: MRI HEAD Brain: There is patchy mildly reduced cortical/subcortical reduced diffusion in the right frontoparietal lobes including involvement of the perirolandic region. There is minimal right occipital lobe involvement as well. Prominence of the ventricles and sulci reflects generalized parenchymal volume loss. Patchy foci of T2 hyperintensity in the supratentorial and pontine white matter are nonspecific but may reflect mild to moderate chronic microvascular ischemic changes. Foci of susceptibility in the left temporal lobe and left cerebellum likely  reflect chronic microhemorrhages. There is no intracranial mass or mass effect. There is no hydrocephalus or extra-axial fluid collection. Vascular: Major vessel flow voids at the skull base are preserved. Skull and upper cervical spine: Normal marrow signal is preserved. Sinuses/Orbits: Paranasal sinuses are aerated. Orbits are unremarkable. Other: Sella is unremarkable.  Mastoid air cells are clear. MRA HEAD Intracranial internal carotid arteries are patent. Middle and anterior cerebral arteries are patent. Intracranial vertebral arteries, basilar artery, posterior cerebral arteries are patent. Right posterior communicating artery is present. A small left posterior communicating artery may be present. There is no significant stenosis or aneurysm. MRA NECK Motion artifact is present. Common, internal, and external carotid arteries are patent. Retropharyngeal course of the internal carotids. Visualized extracranial vertebral arteries are patent and codominant. There is no hemodynamically significant stenosis identified. IMPRESSION: Patchy acute infarcts in the right frontoparietal lobes including the perirolandic region. Minimal right occipital involvement as well (right posterior communicating artery is present). Chronic microvascular ischemic changes. No proximal intracranial vessel occlusion. No hemodynamically significant stenosis in the neck. Electronically Signed   By: Jackquline Berlin.D.  On: 12/30/2020 17:43   MR ANGIO NECK WO CONTRAST  Result Date: 12/30/2020 CLINICAL DATA:  Left hand weakness EXAM: MRI HEAD WITHOUT CONTRAST MRA HEAD WITHOUT CONTRAST MRA NECK WITHOUT CONTRAST TECHNIQUE: Multiplanar, multiecho pulse sequences of the brain and surrounding structures were obtained without intravenous contrast. Angiographic images of the Circle of Willis were obtained using MRA technique without intravenous contrast. Angiographic images of the neck were obtained using MRA technique without intravenous  contrast. Carotid stenosis measurements (when applicable) are obtained utilizing NASCET criteria, using the distal internal carotid diameter as the denominator. COMPARISON:  None. FINDINGS: MRI HEAD Brain: There is patchy mildly reduced cortical/subcortical reduced diffusion in the right frontoparietal lobes including involvement of the perirolandic region. There is minimal right occipital lobe involvement as well. Prominence of the ventricles and sulci reflects generalized parenchymal volume loss. Patchy foci of T2 hyperintensity in the supratentorial and pontine white matter are nonspecific but may reflect mild to moderate chronic microvascular ischemic changes. Foci of susceptibility in the left temporal lobe and left cerebellum likely reflect chronic microhemorrhages. There is no intracranial mass or mass effect. There is no hydrocephalus or extra-axial fluid collection. Vascular: Major vessel flow voids at the skull base are preserved. Skull and upper cervical spine: Normal marrow signal is preserved. Sinuses/Orbits: Paranasal sinuses are aerated. Orbits are unremarkable. Other: Sella is unremarkable.  Mastoid air cells are clear. MRA HEAD Intracranial internal carotid arteries are patent. Middle and anterior cerebral arteries are patent. Intracranial vertebral arteries, basilar artery, posterior cerebral arteries are patent. Right posterior communicating artery is present. A small left posterior communicating artery may be present. There is no significant stenosis or aneurysm. MRA NECK Motion artifact is present. Common, internal, and external carotid arteries are patent. Retropharyngeal course of the internal carotids. Visualized extracranial vertebral arteries are patent and codominant. There is no hemodynamically significant stenosis identified. IMPRESSION: Patchy acute infarcts in the right frontoparietal lobes including the perirolandic region. Minimal right occipital involvement as well (right posterior  communicating artery is present). Chronic microvascular ischemic changes. No proximal intracranial vessel occlusion. No hemodynamically significant stenosis in the neck. Electronically Signed   By: Guadlupe Spanish M.D.   On: 12/30/2020 17:43   MR BRAIN WO CONTRAST  Result Date: 12/30/2020 CLINICAL DATA:  Left hand weakness EXAM: MRI HEAD WITHOUT CONTRAST MRA HEAD WITHOUT CONTRAST MRA NECK WITHOUT CONTRAST TECHNIQUE: Multiplanar, multiecho pulse sequences of the brain and surrounding structures were obtained without intravenous contrast. Angiographic images of the Circle of Willis were obtained using MRA technique without intravenous contrast. Angiographic images of the neck were obtained using MRA technique without intravenous contrast. Carotid stenosis measurements (when applicable) are obtained utilizing NASCET criteria, using the distal internal carotid diameter as the denominator. COMPARISON:  None. FINDINGS: MRI HEAD Brain: There is patchy mildly reduced cortical/subcortical reduced diffusion in the right frontoparietal lobes including involvement of the perirolandic region. There is minimal right occipital lobe involvement as well. Prominence of the ventricles and sulci reflects generalized parenchymal volume loss. Patchy foci of T2 hyperintensity in the supratentorial and pontine white matter are nonspecific but may reflect mild to moderate chronic microvascular ischemic changes. Foci of susceptibility in the left temporal lobe and left cerebellum likely reflect chronic microhemorrhages. There is no intracranial mass or mass effect. There is no hydrocephalus or extra-axial fluid collection. Vascular: Major vessel flow voids at the skull base are preserved. Skull and upper cervical spine: Normal marrow signal is preserved. Sinuses/Orbits: Paranasal sinuses are aerated. Orbits are unremarkable. Other: Sella is  unremarkable.  Mastoid air cells are clear. MRA HEAD Intracranial internal carotid arteries are  patent. Middle and anterior cerebral arteries are patent. Intracranial vertebral arteries, basilar artery, posterior cerebral arteries are patent. Right posterior communicating artery is present. A small left posterior communicating artery may be present. There is no significant stenosis or aneurysm. MRA NECK Motion artifact is present. Common, internal, and external carotid arteries are patent. Retropharyngeal course of the internal carotids. Visualized extracranial vertebral arteries are patent and codominant. There is no hemodynamically significant stenosis identified. IMPRESSION: Patchy acute infarcts in the right frontoparietal lobes including the perirolandic region. Minimal right occipital involvement as well (right posterior communicating artery is present). Chronic microvascular ischemic changes. No proximal intracranial vessel occlusion. No hemodynamically significant stenosis in the neck. Electronically Signed   By: Guadlupe Spanish M.D.   On: 12/30/2020 17:43   ECHOCARDIOGRAM COMPLETE  Result Date: 12/31/2020    ECHOCARDIOGRAM REPORT   Patient Name:   Jo Larson Date of Exam: 12/31/2020 Medical Rec #:  [id number]      Height:       60.0 in Accession #:    [id number]     Weight:       164.0 lb Date of Birth:  1932-05-10       BSA:          1.716 m Patient Age:    89 years       BP:           171/74 mmHg Patient Gender: F              HR:           71 bpm. Exam Location:  Inpatient Procedure: 2D Echo, Cardiac Doppler and Color Doppler Indications:    Stroke I63.9  History:        Patient has prior history of Echocardiogram examinations, most                 recent 08/06/2014.  Sonographer:    Roosvelt Maser RDCS Referring Phys: [id number] Teddy Spike IMPRESSIONS  1. Left ventricular ejection fraction, by estimation, is 60 to 65%. The left ventricle has normal function. The left ventricle has no regional wall motion abnormalities. There is moderate concentric left ventricular hypertrophy. Left ventricular  diastolic parameters are consistent with Grade I diastolic dysfunction (impaired relaxation).  2. Right ventricular systolic function is normal. The right ventricular size is normal. Mildly increased right ventricular wall thickness. Tricuspid regurgitation signal is inadequate for assessing PA pressure.  3. Left atrial size was mildly dilated.  4. The mitral valve is normal in structure. Trivial mitral valve regurgitation.  5. The aortic valve is tricuspid. There is mild calcification of the aortic valve. There is mild thickening of the aortic valve. Aortic valve regurgitation is trivial. Mild to moderate aortic valve sclerosis/calcification is present, without any evidence of aortic stenosis. Comparison(s): Compared to prior echo report in 2016, there is no significant change. Moderate LVH is noted on current study. Conclusion(s)/Recommendation(s): No intracardiac source of embolism detected on this transthoracic study. A transesophageal echocardiogram is recommended to exclude cardiac source of embolism if clinically indicated. FINDINGS  Left Ventricle: Left ventricular ejection fraction, by estimation, is 60 to 65%. The left ventricle has normal function. The left ventricle has no regional wall motion abnormalities. The left ventricular internal cavity size was normal in size. There is  moderate concentric left ventricular hypertrophy. Left ventricular diastolic parameters are consistent with Grade I diastolic dysfunction (impaired relaxation). Right  Ventricle: The right ventricular size is normal. Mildly increased right ventricular wall thickness. Right ventricular systolic function is normal. Tricuspid regurgitation signal is inadequate for assessing PA pressure. Left Atrium: Left atrial size was mildly dilated. Right Atrium: Right atrial size was normal in size. Pericardium: There is no evidence of pericardial effusion. Mitral Valve: The mitral valve is normal in structure. There is mild thickening of the  mitral valve leaflet(s). Trivial mitral valve regurgitation. Tricuspid Valve: The tricuspid valve is normal in structure. Tricuspid valve regurgitation is trivial. Aortic Valve: The aortic valve is tricuspid. There is mild calcification of the aortic valve. There is mild thickening of the aortic valve. Aortic valve regurgitation is trivial. Mild to moderate aortic valve sclerosis/calcification is present, without any evidence of aortic stenosis. Aortic valve mean gradient measures 5.0 mmHg. Aortic valve peak gradient measures 12.5 mmHg. Aortic valve area, by VTI measures 2.22 cm. Pulmonic Valve: The pulmonic valve was normal in structure. Pulmonic valve regurgitation is trivial. Aorta: The aortic root is normal in size and structure. IAS/Shunts: No atrial level shunt detected by color flow Doppler.  LEFT VENTRICLE PLAX 2D LVIDd:         3.40 cm  Diastology LVIDs:         2.40 cm  LV e' medial:    5.77 cm/s LV PW:         1.30 cm  LV E/e' medial:  9.5 LV IVS:        1.20 cm  LV e' lateral:   8.05 cm/s LVOT diam:     1.90 cm  LV E/e' lateral: 6.8 LV SV:         69 LV SV Index:   40 LVOT Area:     2.84 cm  RIGHT VENTRICLE RV Basal diam:  3.90 cm LEFT ATRIUM             Index       RIGHT ATRIUM           Index LA diam:        3.50 cm 2.04 cm/m  RA Area:     15.20 cm LA Vol (A2C):   65.4 ml 38.12 ml/m RA Volume:   35.20 ml  20.52 ml/m LA Vol (A4C):   50.1 ml 29.20 ml/m LA Biplane Vol: 59.6 ml 34.74 ml/m  AORTIC VALVE AV Area (Vmax):    1.89 cm AV Area (Vmean):   2.33 cm AV Area (VTI):     2.22 cm AV Vmax:           177.00 cm/s AV Vmean:          104.000 cm/s AV VTI:            0.309 m AV Peak Grad:      12.5 mmHg AV Mean Grad:      5.0 mmHg LVOT Vmax:         118.00 cm/s LVOT Vmean:        85.600 cm/s LVOT VTI:          0.242 m LVOT/AV VTI ratio: 0.78  AORTA Ao Root diam: 3.20 cm MITRAL VALVE MV Area (PHT): 2.75 cm    SHUNTS MV Decel Time: 276 msec    Systemic VTI:  0.24 m MV E velocity: 54.80 cm/s  Systemic  Diam: 1.90 cm MV A velocity: 80.50 cm/s MV E/A ratio:  0.68 Laurance FlattenHeather Pemberton MD Electronically signed by Laurance FlattenHeather Pemberton MD Signature Date/Time: 12/31/2020/7:00:28 PM    Final  Scheduled Meds:  amLODipine  10 mg Oral Daily   aspirin EC  81 mg Oral Daily   atorvastatin  40 mg Oral Daily   cloNIDine  0.1 mg Oral BID   clopidogrel  75 mg Oral Daily   insulin aspart  0-9 Units Subcutaneous TID WC   levothyroxine  88 mcg Oral Daily   losartan  100 mg Oral Daily   metoprolol tartrate  12.5 mg Oral BID   sertraline  100 mg Oral Daily   Continuous Infusions:   LOS: 2 days        Kathlen Mody, MD [facility]   To contact the attending provider between 7A-7P or the covering provider during after hours 7P-7A, please log into the web site [email or web address] and access using universal [place] password for that web site. If you do not have the password, please call the hospital operator.  01/01/2021, 9:32 AM

## 2021-01-01 NOTE — Progress Notes (Signed)
SLP Cancellation Note  Patient Details Name: Jo Larson MRN: [id number] DOB: June 12, 1932   Cancelled treatment:       Reason Eval/Treat Not Completed: Other (comment) Family not available to aid as interpreter for cognitive-linguistic evaluation; audio interpreter not audible to patient. Nursing to contact ST when family arrives.   [name] P. [name], M.S., CCC-SLP Speech-Language Pathologist [facility] Services Pager: [phone]  [name] [name] 01/01/2021, 9:48 AM

## 2021-01-01 NOTE — Progress Notes (Signed)
STROKE TEAM PROGRESS NOTE   INTERVAL HISTORY  Patient awake and alert.  Neuro exam limited as no interpreter available but appears to be stable.   Hypertensive 180-215/96-184.  Will resume home medications.  Hyperglycemic today. Glucose 467.  Insulin given.  HgbA1C pending.  Will consult diabetic coordinator for recommendations.    Vitals:   01/01/21 0442 01/01/21 0746 01/01/21 0751 01/01/21 1203  BP: (!) 183/96 (!) 215/184 (!) 209/76 (!) 141/58  Pulse: 70 94 75 62  Resp: 18  18 20   Temp: 98.2 F (36.8 C) 97.9 F (36.6 C)  98.1 F (36.7 C)  TempSrc: Oral Oral  Oral  SpO2: 100% 99%  95%  Weight:      Height:       CBC:  Recent Labs  Lab 12/30/20 1039 12/30/20 1208  WBC 9.7  --   NEUTROABS 5.4  --   HGB 12.5 11.6*  HCT 39.7 34.0*  MCV 91.1  --   PLT 301  --    Basic Metabolic Panel:  Recent Labs  Lab 12/30/20 1039 12/30/20 1208 01/01/21 1300  NA 140 139 138  K 4.3 4.3 4.1  CL 106 105 107  CO2 25  --  24  GLUCOSE 271* 280* 161*  BUN 19 23 14   CREATININE 0.72 0.60 0.62  CALCIUM 10.0  --  9.7   Lipid Panel:  Recent Labs  Lab 12/31/20 0301  CHOL 153  TRIG 235*  HDL 31*  CHOLHDL 4.9  VLDL 47*  LDLCALC 75   HgbA1c: No results for input(s): HGBA1C in the last 168 hours. Urine Drug Screen: No results for input(s): LABOPIA, COCAINSCRNUR, LABBENZ, AMPHETMU, THCU, LABBARB in the last 168 hours.  Alcohol Level  Recent Labs  Lab 12/30/20 1039  ETH <10    IMAGING past 24 hours  CT HEAD WO CONTRAST Result Date: 12/30/2020 IMPRESSION: New acute or subacute infarct at the high RIGHT posterior frontal lobe. No signs of hemorrhage, mass effect or midline shift. Stable findings of atrophy and chronic microvascular ischemic change.   MR ANGIO HEAD WO CONTRAST Result Date: 12/30/2020 IMPRESSION: Patchy acute infarcts in the right frontoparietal lobes including the perirolandic region. Minimal right occipital involvement as well (right posterior communicating artery  is present). Chronic microvascular ischemic changes. No proximal intracranial vessel occlusion. No hemodynamically significant stenosis in the neck.   MR ANGIO NECK WO CONTRAST Result Date: 12/30/2020 IMPRESSION: Patchy acute infarcts in the right frontoparietal lobes including the perirolandic region. Minimal right occipital involvement as well (right posterior communicating artery is present). Chronic microvascular ischemic changes. No proximal intracranial vessel occlusion. No hemodynamically significant stenosis in the neck.   MR BRAIN WO CONTRAST  Result Date: 12/30/2020 IMPRESSION: Patchy acute infarcts in the right frontoparietal lobes including the perirolandic region. Minimal right occipital involvement as well (right posterior communicating artery is present). Chronic microvascular ischemic changes. No proximal intracranial vessel occlusion. No hemodynamically significant stenosis in the neck.   PHYSICAL EXAM GENERAL: Patient is alert awake and oriented. Not in obvious distress.   HENT: Pupils equally reactive to light. Oral mucosa is moist NECK: is supple CHEST: Clear to auscultation. No crackles or wheezes.  Diminished breath sounds bilaterally. CVS: S1 and S2 heard, no murmur. Regular rate and rhythm. ABDOMEN: Soft, non-tender, bowel sounds are present. EXTREMITIES: No edema. SKIN: warm and dry without rashes.  Motor: Normal bulk and tone. Left upper extremity drift. Decreased fine motor movements to the left arm.  Moderate weakness of left grip and  left ankle dorsiflexors.   Dlt Bic Tri FgS Grp HF  KnF KnE PIF DoF  R 5 5 5 5 5 5 5 5 5 5   L 3 3 3 3 3 4 4 4 4 4   Sensory: Intact to light touch on the right. Decreased on the left upper extremity Coordination: Intact FNF Gait: Deferred  ASSESSMENT/PLAN Ms. Corrie Reder is a 87 y.o. female with history of DM2, HTN, HLD, CAD, hypothyroidism who presents with left hand grip weakness with a LKW of 12/28/20.  Stroke:  right  frontoparietal lobes including the perirolandic region acute infarct.  CT head: new acute vs subacute infarct at the high right posterior frontal lobe   MRI: patchy acute infarcts in the right frontoparietal lobes including the perirolandic region. Minimal right occipital involvement  MRA: no significant stenosis in the neck 2D Echo EF 60-65%, moderate concentric left ventricular hypertrophy, Grade I diastolic dysfunction. LDL 75 HgbA1c pending VTE prophylaxis - SCDs Diet: heart healthy  No antiplatelet prior to admission, now on aspirin 325 mg daily and clopidogrel 75 mg daily. Continue DAPT Aspirin 81mg  and clopidogrel 75mg  for 21 days then aspirin alone Therapy recommendations:  CIR Disposition:  pending  Hypertension Home meds:  Norvasc 10mg  daily, catepres 0.1mg  bid, cozaar 100mg  daily, lopressor 12.5mg  bid BP range 180-215/96-184 Gradually normalize in 3-5 days Long-term BP goal normotensive  Hyperlipidemia Home meds:  Lipitor 20mg  daily LDL 75, goal < 70 Add Lipitor 40mg  daily  Continue statin at discharge  Diabetes type II Controlled Home meds:  glimerpiride 2mg  daily, metformin 1500mg  daily HgbA1c Pending, goal < 7.0 CBGs Recent Labs    01/01/21 1220 01/01/21 1303 01/01/21 1532  GLUCAP 467* 440* 361*    SSI  Other Stroke Risk Factors Advanced Age >/= 65  Obesity, Body mass index is 32.03 kg/m., BMI >/= 30 associated with increased stroke risk, recommend weight loss, diet and exercise as appropriate  Coronary artery disease: 07/2014 NSTEMI/PCL: LM nl, LAD min irregs, LCX 37m (4.0x18 Xience DES), LPDA 90 (64mm vessel->med Rx), RCA nl, EF 60%.  Other Active Problems Hypothyroidism: Synthroid daily Anxiety: zoloft  Hospital day # 2  Lissy Olivencia-Simmons, ACNP-BC   To contact Stroke Continuity provider, please refer to . After hours, contact General Neurology

## 2021-01-01 NOTE — Progress Notes (Signed)
Inpatient Rehab Admissions Coordinator Note:   Per therapy recommendations, pt was screened for CIR candidacy by Bama Hanselman, MS CCC-SLP. At this time, Pt. Appears to have functional decline and is a good candidate for CIR. Will place order for rehab consult per protocol.  Please contact me with questions.   Declynn Lopresti, MS, CCC-SLP Rehab Admissions Coordinator  [phone] (celll) [phone] (office)  

## 2021-01-02 ENCOUNTER — Inpatient Hospital Stay (HOSPITAL_COMMUNITY): Payer: Medicare Other

## 2021-01-02 DIAGNOSIS — I63231 Cerebral infarction due to unspecified occlusion or stenosis of right carotid arteries: Secondary | ICD-10-CM | POA: Diagnosis not present

## 2021-01-02 LAB — GLUCOSE, CAPILLARY
Glucose-Capillary: 181 mg/dL — ABNORMAL HIGH (ref 70–99)
Glucose-Capillary: 301 mg/dL — ABNORMAL HIGH (ref 70–99)
Glucose-Capillary: 214 mg/dL — ABNORMAL HIGH (ref 70–99)
Glucose-Capillary: 179 mg/dL — ABNORMAL HIGH (ref 70–99)

## 2021-01-02 LAB — HEMOGLOBIN A1C
Hgb A1c MFr Bld: 9 % — ABNORMAL HIGH (ref 4.8–5.6)
Hgb A1c MFr Bld: 8.8 % — ABNORMAL HIGH (ref 4.8–5.6)
Mean Plasma Glucose: 212 mg/dL
Mean Plasma Glucose: 206 mg/dL

## 2021-01-02 MED ORDER — IOHEXOL 350 MG/ML SOLN
75.0000 mL | Freq: Once | INTRAVENOUS | Status: AC | PRN
  Administered 2021-01-02: 75 mL via INTRAVENOUS

## 2021-01-02 MED ORDER — ASPIRIN EC 325 MG PO TBEC
325.0000 mg | DELAYED_RELEASE_TABLET | Freq: Every day | ORAL | Status: DC
  Administered 2021-01-03 – 2021-01-05 (×3): 325 mg via ORAL
  Filled 2021-01-02 (×3): qty 1

## 2021-01-02 MED ORDER — SODIUM CHLORIDE 0.9 % IV SOLN: INTRAVENOUS | Status: DC

## 2021-01-02 MED ORDER — INSULIN DETEMIR 100 UNIT/ML ~~LOC~~ SOLN
5.0000 [IU] | Freq: Two times a day (BID) | SUBCUTANEOUS | Status: DC
  Administered 2021-01-02: 5 [IU] via SUBCUTANEOUS
  Filled 2021-01-02 (×2): qty 0.05

## 2021-01-02 MED ORDER — INSULIN DETEMIR 100 UNIT/ML ~~LOC~~ SOLN
10.0000 [IU] | Freq: Two times a day (BID) | SUBCUTANEOUS | Status: DC
  Administered 2021-01-02 – 2021-01-03 (×2): 10 [IU] via SUBCUTANEOUS
  Filled 2021-01-02 (×5): qty 0.1

## 2021-01-02 NOTE — Progress Notes (Addendum)
STROKE TEAM PROGRESS NOTE   INTERVAL HISTORY She is awake and alert, spoke with patient at length with Ipad interpreter. No acute events overnight. She continues to feel weak on the left side and asks if this will improve.   CTA Head and Neck was ordered to evaluate for atherosclerosis; showed 80 % stenosis in the right carotid bulb. Neuro IR consulted.    Vitals:   01/01/21 2013 01/02/21 0030 01/02/21 0440 01/02/21 0824  BP: (!) 156/59 (!) 109/52 (!) 118/56 (!) 155/80  Pulse:  (!) 54  (!) 51  Resp: 18 18 18 16   Temp:  98.2 F (36.8 C) 98 F (36.7 C) 97.9 F (36.6 C)  TempSrc:  Oral  Oral  SpO2:  95% 95% 93%  Weight:      Height:       CBC:  Recent Labs  Lab 12/30/20 1039 12/30/20 1208  WBC 9.7  --   NEUTROABS 5.4  --   HGB 12.5 11.6*  HCT 39.7 34.0*  MCV 91.1  --   PLT 301  --    Basic Metabolic Panel:  Recent Labs  Lab 12/30/20 1039 12/30/20 1208 01/01/21 1300  NA 140 139 138  K 4.3 4.3 4.1  CL 106 105 107  CO2 25  --  24  GLUCOSE 271* 280* 161*  BUN 19 23 14   CREATININE 0.72 0.60 0.62  CALCIUM 10.0  --  9.7   Lipid Panel:  Recent Labs  Lab 12/31/20 0301  CHOL 153  TRIG 235*  HDL 31*  CHOLHDL 4.9  VLDL 47*  LDLCALC 75   HgbA1c:  Recent Labs  Lab 01/01/21 0943  HGBA1C 8.8*   Urine Drug Screen: No results for input(s): LABOPIA, COCAINSCRNUR, LABBENZ, AMPHETMU, THCU, LABBARB in the last 168 hours.  Alcohol Level  Recent Labs  Lab 12/30/20 1039  ETH <10    Pertinent Imaging   CT HEAD WO CONTRAST Result Date: 12/30/2020 IMPRESSION: New acute or subacute infarct at the high RIGHT posterior frontal lobe. No signs of hemorrhage, mass effect or midline shift. Stable findings of atrophy and chronic microvascular ischemic change.   MR ANGIO HEAD WO CONTRAST Result Date: 12/30/2020 IMPRESSION: Patchy acute infarcts in the right frontoparietal lobes including the perirolandic region. Minimal right occipital involvement as well (right posterior  communicating artery is present). Chronic microvascular ischemic changes. No proximal intracranial vessel occlusion. No hemodynamically significant stenosis in the neck.   MR ANGIO NECK WO CONTRAST Result Date: 12/30/2020 IMPRESSION: Patchy acute infarcts in the right frontoparietal lobes including the perirolandic region. Minimal right occipital involvement as well (right posterior communicating artery is present). Chronic microvascular ischemic changes. No proximal intracranial vessel occlusion. No hemodynamically significant stenosis in the neck.   MR BRAIN WO CONTRAST Result Date: 12/30/2020 IMPRESSION: Patchy acute infarcts in the right frontoparietal lobes including the perirolandic region. Minimal right occipital involvement as well (right posterior communicating artery is present). Chronic microvascular ischemic changes. No proximal intracranial vessel occlusion. No hemodynamically significant stenosis in the neck.   CTA Head and Neck W WO IV Contrast Result date: 01/02/21  1. Hypodense foci in the right frontoparietal region corresponding to subacute infarcts seen on prior MRI. No new focal hypodensity to suggest new infarct. 2. Atherosclerotic changes of the right carotid bifurcation resulting in 80% stenosis of the right carotid bulb. 3. Atherosclerotic changes of the left carotid bifurcation without hemodynamically significant stenosis. 4. Atherosclerotic changes of the bilateral intracranial carotid arteries with approximately 60% stenosis of the  supraclinoid left ICA and 65% stenosis of the paraclinoid right ICA. 5. Luminal irregularity of the bilateral ACA, MCA and PCA vascular trees suggestive intracranial atherosclerotic disease without hemodynamically significant stenosis. 6. A 1-2 mm outpouching from the left M1/MCA segment, at the origin of a lenticulostriate artery, suggestive of small aneurysm versus infundibulum.  PHYSICAL EXAM GENERAL: Patient is alert awake and oriented. Not  in obvious distress.   HENT: Pupils equally reactive to light. Oral mucosa is moist NECK: is supple CHEST: Clear to auscultation. No crackles or wheezes.  Diminished breath sounds bilaterally. CVS: S1 and S2 heard, no murmur. Regular rate and rhythm. ABDOMEN: Soft, non-tender, bowel sounds are present. EXTREMITIES: No edema. SKIN: warm and dry without rashes.  Motor: Normal bulk and tone. Left upper extremity drift. Decreased fine motor movements to the left arm.  Moderate weakness of left grip and left ankle dorsiflexors.   Dlt Bic Tri FgS Grp HF  KnF KnE PIF DoF  R 5 5 5 5 5 5 5 5 5 5   L 3 3 3 3 3 4 4 4 4 4   Sensory: Intact to light touch on the right. Decreased on the left upper extremity Coordination: Intact FNF Gait: Deferred  ASSESSMENT/PLAN [name] is a 87 y.o. female with history of DM2, HTN, HLD, CAD, hypothyroidism who presents with left hand grip weakness with a LKW of 12/28/20.  Stroke: Right frontoparietal lobes including the perirolandic region acute infarct.  CT head: new acute vs subacute infarct at the high right posterior frontal lobe   MRI: patchy acute infarcts in the right frontoparietal lobes including the perirolandic region. Minimal right occipital involvement  MRA: no significant stenosis in the neck CTA Head and Neck was pertinent for 80 % stenosis right carotid bulb, approximately 60% stenosis of the supraclinoid left ICA and 65% stenosis of the paraclinoid right ICA. Luminal irregularity of the bilateral ACA, MCA and PCA vascular trees suggestive intracranial atherosclerotic disease without hemodynamically significant stenosis. Neuro IR consulted on 6/13 given right carotid stenosis  2D Echo EF 60-65%, moderate concentric left ventricular hypertrophy, Grade I diastolic dysfunction. LDL 75 HgbA1c 8.8  VTE prophylaxis - SCDs Diet: heart healthy  No antiplatelet prior to admission, now on aspirin 325 mg daily and clopidogrel 75 mg daily. Continue DAPT  Aspirin 81mg  and clopidogrel 75mg  for 21 days then aspirin alone Therapy recommendations:  CIR Disposition:  Pending  Hypertension Home meds:  Norvasc 10mg  daily, catepres 0.1mg  bid, cozaar 100mg  daily, lopressor 12.5mg  bid BP range 180-215/96-184 Gradually normalize in 3-5 days Long-term BP goal normotensive  Hyperlipidemia Home meds:  Lipitor 20mg  daily LDL 75, goal < 70 Add Lipitor 40mg  daily  Continue statin at discharge  Diabetes type II Controlled Home meds:  glimerpiride 2mg  daily, metformin 1500mg  daily HgbA1c 8.8, goal < 7.0 Primary team to manage DMII inpatient, recommend treatment with SSI while hospitalized   Other Stroke Risk Factors Advanced Age >/= 2  Obesity, Body mass index is 32.03 kg/m., BMI >/= 30 associated with increased stroke risk, recommend weight loss, diet and exercise as appropriate  Coronary artery disease: 07/2014 NSTEMI/PCL: LM nl, LAD min irregs, LCX 92m (4.0x18 Xience DES), LPDA 90 (77mm vessel->med Rx), RCA nl, EF 60%.  Other Active Problems Hypothyroidism: Synthroid daily Anxiety: zoloft  Hospital day # 3  , AGPCNP-BC Stroke Nurse Practitioner    ATTENDING NOTE: I reviewed above note and agree with the assessment and plan. Pt was seen and examined.   87 year old  female with history of diabetes, hypertension, hyperlipidemia, CAD admitted for right sided weakness.  CT showed right posterior frontal infarct.  MRI showed punctate infarct at right frontal parietal cortex.  MRA head and neck motion degraded.  CTA head and neck showed right ICA bulb 80%, bilateral ICA siphon 60 to 65% stenosis.  EF 60 to 65%.  LDL 75, A1c 8.8, creatinine 0.62.  On exam, with assistance from iPad interpreter, patient awake alert, orientated to place age and months but not to year.  Follows all simple commands, no aphasia.  Able to name and repeat.  Visual field full, no gaze palsy, mild left facial droop.  Left upper extremity 3/5 compared with  right upper extremity at least 4/5.  Bilateral lower extremity proximal 3/5, distally right DF 4/5, left DF 3/5.  Sensation symmetrical and finger-nose intact bilaterally.  Etiology for patient stroke likely due to right ICA high-grade stenosis.  Interventional radiology consulted, pending family decision for intervention.  BP high on presentation, however low this afternoon, RN reported decreased p.o. intake, put on IVF.  DC metoprolol 12.5 twice daily.  BP goal 130-160 before intervention.  Continue aspirin 325 and Plavix 75 and Lipitor 40.  Will follow.  For detailed assessment and plan, please refer to above as I have made changes wherever appropriate.   Marvel Plan, MD PhD Stroke Neurology 01/02/2021 7:01 PM    To contact Stroke Continuity provider, please refer to [email or web address]. After hours, contact General Neurology

## 2021-01-02 NOTE — Consult Note (Signed)
**Note Jo-Identified via Obfuscation** Chief Complaint: Patient was seen in consultation today for right ICA stenosis  Referring Physician(s): Dr. Marvel Plan  Supervising Physician: Baldemar Lenis  Patient Status: [name] - In-pt  History of Present Illness: Jo Larson is a 87 y.o. female with past medical history of anxiety, CAD, DM, HLD, HTN, presented to [facility] ED with numbness in the left hand for the past 10 days.  MR Brain showed patchy R frontoparietal lobe infarcts including the perirorlandic region. Patient was planning for medical optimization with potential CIR. Additional CTA Head and Neck was obtained today and showed significant stenosis of the R ICA not previously well visualized on the MR Brain possibly due to motion artifact. IR now consulted for possible angiogram with intervention of R ICA stenosis.   Patient assessed at bedside alongside Dr. Tommie Sams via Ipad interpreter.   Patient is alert and takes time to answer orientation questions but appears to be oriented to person and time.  It is difficult to tell whether she understands her medical situation.  She tells me that Jo Larson and Jo Larson are her grandsons who help her with care and decisions.  When asked if she would like for me to discuss her medical findings and care plan with them she says "that would be great." She describes ongoing numbness in the left head.  Denies pain.   Past Medical History:  Diagnosis Date   Abnormal chest x-ray    Angioedema    a. 05/2014 - felt to be 2/2 clindamycin therapy.   Anxiety    CAD (coronary artery disease)    a. 07/2014 NSTEMI/PCI: LM nl, LAD min irregs, LCX 34m (4.0x18 Xience DES), LPDA 90 (2mm vessel->med Rx), RCA nl, EF 60%.   Diabetes mellitus without complication (HCC)    Hyperlipemia    Hypertension    Hypothyroidism     Past Surgical History:  Procedure Laterality Date   INTUBATION NASOTRACHEAL N/A 04/03/2013   Procedure: FIBEROPTIC NASAL-TRACHEAL INTUBATION;  Surgeon: Christia Reading, MD;  Location: [facility] ORS;  Service: ENT;  Laterality: N/A;   LEFT HEART CATHETERIZATION WITH CORONARY ANGIOGRAM N/A 07/26/2014   Procedure: LEFT HEART CATHETERIZATION WITH CORONARY ANGIOGRAM;  Surgeon: Runell Gess, MD;  Location: [facility] CATH LAB;  Service: Cardiovascular;  Laterality: N/A;    Allergies: Clindamycin/lincomycin, Peanut butter flavor, and Peanuts [peanut oil]  Medications: Prior to Admission medications   Medication Sig Start Date End Date Taking? Authorizing Provider  acetaminophen (TYLENOL) 325 MG tablet Take 325 mg by mouth in the morning and at bedtime.   Yes [provider]  amLODipine (NORVASC) 10 MG tablet Take 1 tablet by mouth daily. 09/27/20  Yes [provider]  atorvastatin (LIPITOR) 20 MG tablet Take 1 tablet by mouth daily. 11/07/20  Yes [provider]  Cholecalciferol 25 MCG (1000 UT) capsule Take 1,000 Units by mouth daily.   Yes [provider]  cloNIDine (CATAPRES) 0.1 MG tablet Take 1 tablet by mouth 2 (two) times daily. 07/20/16  Yes [provider]  esomeprazole (NEXIUM) 40 MG capsule Take 1 capsule by mouth daily with breakfast. 08/20/16  Yes [provider]  glimepiride (AMARYL) 2 MG tablet Take 2 mg by mouth daily. 11/03/20  Yes [provider]  levothyroxine (SYNTHROID) 88 MCG tablet Take 88 mcg by mouth daily. 11/22/20  Yes [provider]  losartan (COZAAR) 100 MG tablet Take 1 tablet (100 mg total) by mouth daily. 08/21/19  Yes Rollene Rotunda, MD  metFORMIN (GLUCOPHAGE-XR) 500  MG 24 hr tablet Take 1,500 mg by mouth daily with breakfast. 12/01/20  Yes [provider]  metoprolol tartrate (LOPRESSOR) 25 MG tablet Take 12.5 mg by mouth 2 (two) times daily. 08/20/16  Yes [provider]  sertraline (ZOLOFT) 100 MG tablet Take 1 tablet by mouth daily. 11/03/20  Yes [provider]  amLODipine (NORVASC) 5 MG tablet Take 1 tablet (5 mg total) by mouth daily. 10/12/16  10/12/17  Rollene Rotunda, MD  azithromycin (ZITHROMAX) 250 MG tablet Take 1 tablet (250 mg total) by mouth daily. Take first 2 tablets together, then 1 every day until finished. Patient not taking: Reported on 12/30/2020 09/03/16   Elson Areas, PA-C  diphenhydrAMINE (BENADRYL) 25 MG tablet Take 1 tablet (25 mg total) by mouth every 6 (six) hours as needed for allergies. Patient not taking: Reported on 12/30/2020 08/09/18   Arby Barrette, MD  famotidine (PEPCID) 20 MG tablet Take 1 tablet (20 mg total) by mouth 2 (two) times daily. Patient not taking: Reported on 12/30/2020 08/09/18   Arby Barrette, MD  LORazepam (ATIVAN) 1 MG tablet Take 1 tablet (1 mg total) by mouth 3 (three) times daily as needed for anxiety. Patient not taking: Reported on 12/30/2020 09/03/16   Elson Areas, PA-C  predniSONE (DELTASONE) 20 MG tablet 2 tabs po daily x 3 days Patient not taking: Reported on 12/30/2020 08/09/18   Arby Barrette, MD     Family History  Problem Relation Age of Onset   Hypertension Father    Diabetes Father     Social History   Socioeconomic History   Marital status: Married    Spouse name: Not on file   Number of children: 3   Years of education: Not on file   Highest education level: Not on file  Occupational History   Not on file  Tobacco Use   Smoking status: Never   Smokeless tobacco: Never  Vaping Use   Vaping Use: Never used  Substance and Sexual Activity   Alcohol use: No   Drug use: No   Sexual activity: Never  Other Topics Concern   Not on file  Social History Narrative   Lives at home with daughter in law.    Social Determinants of Health   Financial Resource Strain: Not on file  Food Insecurity: Not on file  Transportation Needs: Not on file  Physical Activity: Not on file  Stress: Not on file  Social Connections: Not on file     Review of Systems: A 12 point ROS discussed and pertinent positives are indicated in the HPI above.  All other systems are  negative.  Review of Systems  Constitutional:  Negative for fatigue and fever.  Respiratory:  Negative for cough and shortness of breath.   Cardiovascular:  Negative for chest pain.  Gastrointestinal:  Negative for abdominal pain, diarrhea, nausea and vomiting.  Musculoskeletal:  Negative for back pain.  Neurological:  Positive for numbness (left hand and fingers) and headaches (off/on, none now). Negative for dizziness and weakness.  Psychiatric/Behavioral:  Negative for behavioral problems and confusion.    Vital Signs: BP (!) 97/44 (BP Location: Right Arm)   Pulse (!) 53   Temp 98.1 F (36.7 C) (Oral)   Resp 18   Ht 5' (1.524 m)   Wt 164 lb (74.4 kg)   SpO2 97%   BMI 32.03 kg/m   Physical Exam       Imaging: CT ANGIO HEAD NECK W WO CM  Result Date: 01/02/2021 CLINICAL DATA:  Stroke, follow-up. EXAM: CT ANGIOGRAPHY HEAD AND NECK TECHNIQUE: Multidetector CT imaging of the head and neck was performed using the standard protocol during bolus administration of intravenous contrast. Multiplanar CT image reconstructions and MIPs were obtained to evaluate the vascular anatomy. Carotid stenosis measurements (when applicable) are obtained utilizing NASCET criteria, using the distal internal carotid diameter as the denominator. CONTRAST:  75mL OMNIPAQUE IOHEXOL 350 MG/ML SOLN COMPARISON:  MRI of the brain, MRA head and neck, December 30, 2020. FINDINGS: CT HEAD FINDINGS Brain: Hypodense foci in the right frontal parietal region consistent with subacute infarcts, better demonstrated on prior MRI. No new hypodense areas to suggest new acute infarct. No hemorrhage, hydrocephalus, extra-axial collection or mass lesion. Mild parenchymal volume loss, more pronounced in the bilateral frontal lobes. Vascular: Calcified plaques in the bilateral carotid siphons, bilateral intracranial vertebral arteries and basilar artery. Skull: Normal. Negative for fracture or focal lesion. Sinuses: Trace mucosal  thickening of the ethmoid cells. Orbits: No acute finding. Review of the MIP images confirms the above findings CTA NECK FINDINGS Aortic arch: Standard branching. Imaged portion shows no evidence of aneurysm or dissection. Atherosclerotic plaques are seen in the aortic arch with ulcerated plaque at the origin of the innominate artery. No significant stenosis of the major arch vessel origins. Right carotid system: Atherosclerotic plaques along the right common carotid artery without hemodynamically significant stenosis. There is prominent atherosclerotic plaque, predominantly soft in the right carotid bulb resulting in approximately 80% stenosis. Increased tortuosity with retropharyngeal course of the cervical segment of the right ICA. Left carotid system: Atherosclerotic changes of the left common carotid artery and left carotid bifurcation without hemodynamically significant stenosis. Increased tortuosity with retropharyngeal course of the cervical right ICA. Vertebral arteries: Codominant. No evidence of dissection, stenosis (50% or greater) or occlusion. Skeleton: Degenerative changes of the cervical spine. Other neck: Negative. Upper chest: Negative. Review of the MIP images confirms the above findings CTA HEAD FINDINGS Anterior circulation: Calcified plaques in the bilateral carotid siphons. There is approximately 60% stenosis of the supraclinoid left ICA and 65% stenosis of the paraclinoid segment of the right ICA. A 1-2 mm outpouching at the origin of the left lenticulostriate artery from the left M1 segment, suggesting small aneurysm versus infundibulum. Mild luminal irregularity in the bilateral ACA and MCA vascular trees suggestive mild atherosclerotic disease without hemodynamically significant stenosis. Posterior circulation: Mild luminal irregularity of the basilar artery and bilateral posterior cerebral arteries suggestive of atherosclerotic disease without hemodynamically significant stenosis. Venous  sinuses: As permitted by contrast timing, patent. Anatomic variants: None significant. Review of the MIP images confirms the above findings IMPRESSION: 1. Hypodense foci in the right frontoparietal region corresponding to subacute infarcts seen on prior MRI. No new focal hypodensity to suggest new infarct. 2. Atherosclerotic changes of the right carotid bifurcation resulting in 80% stenosis of the right carotid bulb. 3. Atherosclerotic changes of the left carotid bifurcation without hemodynamically significant stenosis. 4. Atherosclerotic changes of the bilateral intracranial carotid arteries with approximately 60% stenosis of the supraclinoid left ICA and 65% stenosis of the paraclinoid right ICA. 5. Luminal irregularity of the bilateral ACA, MCA and PCA vascular trees suggestive intracranial atherosclerotic disease without hemodynamically significant stenosis. 6. A 1-2 mm outpouching from the left M1/MCA segment, at the origin of a lenticulostriate artery, suggestive of small aneurysm versus infundibulum. 7. Results communicated via secure text paging to Dr. Roda Shutters at 10:20 a.m. on 01/02/2021. Electronically Signed   By: Dorise Bullion  Quay Burow M.D.   On: 01/02/2021 10:21   CT HEAD WO CONTRAST  Result Date: 12/30/2020 CLINICAL DATA:  Numbness and weakness, difficulty ambulating at baseline. Unable to move LEFT hand since 3 a.m. EXAM: CT HEAD WITHOUT CONTRAST TECHNIQUE: Contiguous axial images were obtained from the base of the skull through the vertex without intravenous contrast. COMPARISON:  June 12, 2020. FINDINGS: Brain: New area of hypoattenuation at the gray-white junction in the RIGHT posterior frontal lobe (image 22/2) no sign of intracranial hemorrhage, mass, mass effect or midline shift. Extensive cerebral atrophy and chronic microvascular ischemic change. Vascular: No hyperdense vessel or unexpected calcification. Skull: Normal. Negative for fracture or focal lesion. Sinuses/Orbits: Visualized  paranasal sinuses and orbits are unremarkable. Other: None IMPRESSION: New acute or subacute infarct at the high RIGHT posterior frontal lobe. No signs of hemorrhage, mass effect or midline shift. Stable findings of atrophy and chronic microvascular ischemic change. Critical Value/emergent results were called by telephone at the time of interpretation on 12/30/2020 at 12:50 pm to provider CLAUDIA GIBBONS , who verbally acknowledged these results. Electronically Signed   By: Donzetta Kohut M.D.   On: 12/30/2020 12:47   MR ANGIO HEAD WO CONTRAST  Result Date: 12/30/2020 CLINICAL DATA:  Left hand weakness EXAM: MRI HEAD WITHOUT CONTRAST MRA HEAD WITHOUT CONTRAST MRA NECK WITHOUT CONTRAST TECHNIQUE: Multiplanar, multiecho pulse sequences of the brain and surrounding structures were obtained without intravenous contrast. Angiographic images of the Circle of Willis were obtained using MRA technique without intravenous contrast. Angiographic images of the neck were obtained using MRA technique without intravenous contrast. Carotid stenosis measurements (when applicable) are obtained utilizing NASCET criteria, using the distal internal carotid diameter as the denominator. COMPARISON:  None. FINDINGS: MRI HEAD Brain: There is patchy mildly reduced cortical/subcortical reduced diffusion in the right frontoparietal lobes including involvement of the perirolandic region. There is minimal right occipital lobe involvement as well. Prominence of the ventricles and sulci reflects generalized parenchymal volume loss. Patchy foci of T2 hyperintensity in the supratentorial and pontine white matter are nonspecific but may reflect mild to moderate chronic microvascular ischemic changes. Foci of susceptibility in the left temporal lobe and left cerebellum likely reflect chronic microhemorrhages. There is no intracranial mass or mass effect. There is no hydrocephalus or extra-axial fluid collection. Vascular: Major vessel flow voids at  the skull base are preserved. Skull and upper cervical spine: Normal marrow signal is preserved. Sinuses/Orbits: Paranasal sinuses are aerated. Orbits are unremarkable. Other: Sella is unremarkable.  Mastoid air cells are clear. MRA HEAD Intracranial internal carotid arteries are patent. Middle and anterior cerebral arteries are patent. Intracranial vertebral arteries, basilar artery, posterior cerebral arteries are patent. Right posterior communicating artery is present. A small left posterior communicating artery may be present. There is no significant stenosis or aneurysm. MRA NECK Motion artifact is present. Common, internal, and external carotid arteries are patent. Retropharyngeal course of the internal carotids. Visualized extracranial vertebral arteries are patent and codominant. There is no hemodynamically significant stenosis identified. IMPRESSION: Patchy acute infarcts in the right frontoparietal lobes including the perirolandic region. Minimal right occipital involvement as well (right posterior communicating artery is present). Chronic microvascular ischemic changes. No proximal intracranial vessel occlusion. No hemodynamically significant stenosis in the neck. Electronically Signed   By: Guadlupe Spanish M.D.   On: 12/30/2020 17:43   MR ANGIO NECK WO CONTRAST  Result Date: 12/30/2020 CLINICAL DATA:  Left hand weakness EXAM: MRI HEAD WITHOUT CONTRAST MRA HEAD WITHOUT CONTRAST MRA NECK WITHOUT CONTRAST TECHNIQUE:  Multiplanar, multiecho pulse sequences of the brain and surrounding structures were obtained without intravenous contrast. Angiographic images of the Circle of Willis were obtained using MRA technique without intravenous contrast. Angiographic images of the neck were obtained using MRA technique without intravenous contrast. Carotid stenosis measurements (when applicable) are obtained utilizing NASCET criteria, using the distal internal carotid diameter as the denominator. COMPARISON:  None.  FINDINGS: MRI HEAD Brain: There is patchy mildly reduced cortical/subcortical reduced diffusion in the right frontoparietal lobes including involvement of the perirolandic region. There is minimal right occipital lobe involvement as well. Prominence of the ventricles and sulci reflects generalized parenchymal volume loss. Patchy foci of T2 hyperintensity in the supratentorial and pontine white matter are nonspecific but may reflect mild to moderate chronic microvascular ischemic changes. Foci of susceptibility in the left temporal lobe and left cerebellum likely reflect chronic microhemorrhages. There is no intracranial mass or mass effect. There is no hydrocephalus or extra-axial fluid collection. Vascular: Major vessel flow voids at the skull base are preserved. Skull and upper cervical spine: Normal marrow signal is preserved. Sinuses/Orbits: Paranasal sinuses are aerated. Orbits are unremarkable. Other: Sella is unremarkable.  Mastoid air cells are clear. MRA HEAD Intracranial internal carotid arteries are patent. Middle and anterior cerebral arteries are patent. Intracranial vertebral arteries, basilar artery, posterior cerebral arteries are patent. Right posterior communicating artery is present. A small left posterior communicating artery may be present. There is no significant stenosis or aneurysm. MRA NECK Motion artifact is present. Common, internal, and external carotid arteries are patent. Retropharyngeal course of the internal carotids. Visualized extracranial vertebral arteries are patent and codominant. There is no hemodynamically significant stenosis identified. IMPRESSION: Patchy acute infarcts in the right frontoparietal lobes including the perirolandic region. Minimal right occipital involvement as well (right posterior communicating artery is present). Chronic microvascular ischemic changes. No proximal intracranial vessel occlusion. No hemodynamically significant stenosis in the neck.  Electronically Signed   By: Guadlupe Spanish M.D.   On: 12/30/2020 17:43   MR BRAIN WO CONTRAST  Result Date: 12/30/2020 CLINICAL DATA:  Left hand weakness EXAM: MRI HEAD WITHOUT CONTRAST MRA HEAD WITHOUT CONTRAST MRA NECK WITHOUT CONTRAST TECHNIQUE: Multiplanar, multiecho pulse sequences of the brain and surrounding structures were obtained without intravenous contrast. Angiographic images of the Circle of Willis were obtained using MRA technique without intravenous contrast. Angiographic images of the neck were obtained using MRA technique without intravenous contrast. Carotid stenosis measurements (when applicable) are obtained utilizing NASCET criteria, using the distal internal carotid diameter as the denominator. COMPARISON:  None. FINDINGS: MRI HEAD Brain: There is patchy mildly reduced cortical/subcortical reduced diffusion in the right frontoparietal lobes including involvement of the perirolandic region. There is minimal right occipital lobe involvement as well. Prominence of the ventricles and sulci reflects generalized parenchymal volume loss. Patchy foci of T2 hyperintensity in the supratentorial and pontine white matter are nonspecific but may reflect mild to moderate chronic microvascular ischemic changes. Foci of susceptibility in the left temporal lobe and left cerebellum likely reflect chronic microhemorrhages. There is no intracranial mass or mass effect. There is no hydrocephalus or extra-axial fluid collection. Vascular: Major vessel flow voids at the skull base are preserved. Skull and upper cervical spine: Normal marrow signal is preserved. Sinuses/Orbits: Paranasal sinuses are aerated. Orbits are unremarkable. Other: Sella is unremarkable.  Mastoid air cells are clear. MRA HEAD Intracranial internal carotid arteries are patent. Middle and anterior cerebral arteries are patent. Intracranial vertebral arteries, basilar artery, posterior cerebral arteries are patent. Right posterior  communicating artery is present. A small left posterior communicating artery may be present. There is no significant stenosis or aneurysm. MRA NECK Motion artifact is present. Common, internal, and external carotid arteries are patent. Retropharyngeal course of the internal carotids. Visualized extracranial vertebral arteries are patent and codominant. There is no hemodynamically significant stenosis identified. IMPRESSION: Patchy acute infarcts in the right frontoparietal lobes including the perirolandic region. Minimal right occipital involvement as well (right posterior communicating artery is present). Chronic microvascular ischemic changes. No proximal intracranial vessel occlusion. No hemodynamically significant stenosis in the neck. Electronically Signed   By: Guadlupe Spanish M.D.   On: 12/30/2020 17:43   ECHOCARDIOGRAM COMPLETE  Result Date: 12/31/2020    ECHOCARDIOGRAM REPORT   Patient Name:   [facility] Reaver Date of Exam: 12/31/2020 Medical Rec #:  [id number]      Height:       60.0 in Accession #:    [id number]     Weight:       164.0 lb Date of Birth:  07/26/1931       BSA:          1.716 m Patient Age:    89 years       BP:           171/74 mmHg Patient Gender: F              HR:           71 bpm. Exam Location:  Inpatient Procedure: 2D Echo, Cardiac Doppler and Color Doppler Indications:    Stroke I63.9  History:        Patient has prior history of Echocardiogram examinations, most                 recent 08/06/2014.  Sonographer:    Roosvelt Maser RDCS Referring Phys: [id number] Teddy Spike IMPRESSIONS  1. Left ventricular ejection fraction, by estimation, is 60 to 65%. The left ventricle has normal function. The left ventricle has no regional wall motion abnormalities. There is moderate concentric left ventricular hypertrophy. Left ventricular diastolic parameters are consistent with Grade I diastolic dysfunction (impaired relaxation).  2. Right ventricular systolic function is normal. The right ventricular  size is normal. Mildly increased right ventricular wall thickness. Tricuspid regurgitation signal is inadequate for assessing PA pressure.  3. Left atrial size was mildly dilated.  4. The mitral valve is normal in structure. Trivial mitral valve regurgitation.  5. The aortic valve is tricuspid. There is mild calcification of the aortic valve. There is mild thickening of the aortic valve. Aortic valve regurgitation is trivial. Mild to moderate aortic valve sclerosis/calcification is present, without any evidence of aortic stenosis. Comparison(s): Compared to prior echo report in 2016, there is no significant change. Moderate LVH is noted on current study. Conclusion(s)/Recommendation(s): No intracardiac source of embolism detected on this transthoracic study. A transesophageal echocardiogram is recommended to exclude cardiac source of embolism if clinically indicated. FINDINGS  Left Ventricle: Left ventricular ejection fraction, by estimation, is 60 to 65%. The left ventricle has normal function. The left ventricle has no regional wall motion abnormalities. The left ventricular internal cavity size was normal in size. There is  moderate concentric left ventricular hypertrophy. Left ventricular diastolic parameters are consistent with Grade I diastolic dysfunction (impaired relaxation). Right Ventricle: The right ventricular size is normal. Mildly increased right ventricular wall thickness. Right ventricular systolic function is normal. Tricuspid regurgitation signal is inadequate for assessing PA pressure. Left Atrium: Left atrial size was mildly  dilated. Right Atrium: Right atrial size was normal in size. Pericardium: There is no evidence of pericardial effusion. Mitral Valve: The mitral valve is normal in structure. There is mild thickening of the mitral valve leaflet(s). Trivial mitral valve regurgitation. Tricuspid Valve: The tricuspid valve is normal in structure. Tricuspid valve regurgitation is trivial. Aortic  Valve: The aortic valve is tricuspid. There is mild calcification of the aortic valve. There is mild thickening of the aortic valve. Aortic valve regurgitation is trivial. Mild to moderate aortic valve sclerosis/calcification is present, without any evidence of aortic stenosis. Aortic valve mean gradient measures 5.0 mmHg. Aortic valve peak gradient measures 12.5 mmHg. Aortic valve area, by VTI measures 2.22 cm. Pulmonic Valve: The pulmonic valve was normal in structure. Pulmonic valve regurgitation is trivial. Aorta: The aortic root is normal in size and structure. IAS/Shunts: No atrial level shunt detected by color flow Doppler.  LEFT VENTRICLE PLAX 2D LVIDd:         3.40 cm  Diastology LVIDs:         2.40 cm  LV e' medial:    5.77 cm/s LV PW:         1.30 cm  LV E/e' medial:  9.5 LV IVS:        1.20 cm  LV e' lateral:   8.05 cm/s LVOT diam:     1.90 cm  LV E/e' lateral: 6.8 LV SV:         69 LV SV Index:   40 LVOT Area:     2.84 cm  RIGHT VENTRICLE RV Basal diam:  3.90 cm LEFT ATRIUM             Index       RIGHT ATRIUM           Index LA diam:        3.50 cm 2.04 cm/m  RA Area:     15.20 cm LA Vol (A2C):   65.4 ml 38.12 ml/m RA Volume:   35.20 ml  20.52 ml/m LA Vol (A4C):   50.1 ml 29.20 ml/m LA Biplane Vol: 59.6 ml 34.74 ml/m  AORTIC VALVE AV Area (Vmax):    1.89 cm AV Area (Vmean):   2.33 cm AV Area (VTI):     2.22 cm AV Vmax:           177.00 cm/s AV Vmean:          104.000 cm/s AV VTI:            0.309 m AV Peak Grad:      12.5 mmHg AV Mean Grad:      5.0 mmHg LVOT Vmax:         118.00 cm/s LVOT Vmean:        85.600 cm/s LVOT VTI:          0.242 m LVOT/AV VTI ratio: 0.78  AORTA Ao Root diam: 3.20 cm MITRAL VALVE MV Area (PHT): 2.75 cm    SHUNTS MV Decel Time: 276 msec    Systemic VTI:  0.24 m MV E velocity: 54.80 cm/s  Systemic Diam: 1.90 cm MV A velocity: 80.50 cm/s MV E/A ratio:  0.68 Laurance Flatten MD Electronically signed by Laurance Flatten MD Signature Date/Time: 12/31/2020/7:00:28 PM     Final     Labs:  CBC: Recent Labs    12/30/20 1039 12/30/20 1208  WBC 9.7  --   HGB 12.5 11.6*  HCT 39.7 34.0*  PLT 301  --  COAGS: Recent Labs    12/30/20 1039  INR 0.9  APTT 31    BMP: Recent Labs    12/30/20 1039 12/30/20 1208 01/01/21 1300  NA 140 139 138  K 4.3 4.3 4.1  CL 106 105 107  CO2 25  --  24  GLUCOSE 271* 280* 161*  BUN 19 23 14   CALCIUM 10.0  --  9.7  CREATININE 0.72 0.60 0.62  GFRNONAA >60  --  >60    LIVER FUNCTION TESTS: Recent Labs    12/30/20 1039  BILITOT 0.3  AST 17  ALT 17  ALKPHOS 56  PROT 7.4  ALBUMIN 3.8    TUMOR MARKERS: No results for input(s): AFPTM, CEA, CA199, CHROMGRNA in the last 8760 hours.  Assessment and Plan: R ICA stenosis R frontoparietal infarcts Patient with history of remote stroke, no residual deficits presents with left hand numbness, tingling, weakness for the past 10 days.  CTA Head and Neck shows significant R ICA stenosis.  Via language interpreter, able to to confirm the onset of symptoms and that she is still having numbness.  Ms. Franki MonteCvijetic reports that she lives with family who are sometimes working during the day. She does take several medications at home.  She defers medical decision to her family, specifically her grandsons Jo Larson and Jo Larson due to language barrier.  Called and spoke with both Apolinar JunesBrandon and Jo Larson.  Was able to discuss at length with Jo Larson the CT findings of stenosis and contribution to her symptoms.  Discussed procedure and necessity of medications post-procedure. Appears patient is also being considered for CIR which may also require more regular home assistance.  Jo Larson asks for time to talk with mother.   He plans to visit with his mother this afternoon.  IR will plan to reach out to family tomorrow to discuss further whether carotid intervention is the right course of action for Ms. Bible.  Will make NPO p MN.    Thank you for this interesting consult.  I greatly enjoyed  meeting Lynda Gravett and look forward to participating in their care.  A copy of this report was sent to the requesting provider on this date.  Electronically Signed: Hoyt KochKacie Sue-Ellen Clotile Whittington, PA 01/02/2021, 4:04 PM   I spent a total of 40 Minutes    in face to face in clinical consultation, greater than 50% of which was counseling/coordinating care for R ICA stenosis.

## 2021-01-02 NOTE — Progress Notes (Signed)
Inpatient Diabetes Program Recommendations  AACE/ADA: New Consensus Statement on Inpatient Glycemic Control (2015)  Target Ranges:  Prepandial:   less than 140 mg/dL      Peak postprandial:   less than 180 mg/dL (1-2 hours)      Critically ill patients:  140 - 180 mg/dL   Lab Results  Component Value Date   GLUCAP 181 (H) 01/02/2021   HGBA1C 7.2 (H) 07/24/2014    Review of Glycemic Control  Diabetes history: DM2 Outpatient Diabetes medications: Amaryl 4 mg QAM, metformin 500 mg BID Current orders for Inpatient glycemic control: Novolog 0-15 units TID with meals and 0-5 HS  CBGs on 6/12 : 109-467. This am - 181 mg/dL May benefit from adding meal coverage insulin. Ate 90% yesterday. Awaiting HgbA1C results.  Inpatient Diabetes Program Recommendations:    Add Novolog 3 units TID with meals if eating > 50% meal. If FBS > 180 mg/dL, add low dose Lantus - 10 units QHS.  Will f/u when HgbA1C results are in.  Thank you. Ailene Ards, RD, LDN, CDE Inpatient Diabetes Coordinator [phone]

## 2021-01-02 NOTE — Progress Notes (Signed)
Inpatient Rehab Admissions Coordinator:   I spoke with Pt.'s family over the phone regarding potential CIR admission. They are interested in CIR for pt. And able to provide 24/7 support at discharge. I will open a case with pt.'s insurance today.   Megan Salon, MS, CCC-SLP Rehab Admissions Coordinator  [phone] (celll) [phone] (office)

## 2021-01-02 NOTE — Progress Notes (Signed)
[facility] PROGRESS NOTE    Progress Note  Jo Larson  OVZ:[id number] DOB: 08-Apr-1932 DOA: 12/30/2020 PCP: Macy Mis, MD     Brief Narrative:   Jo Larson is an 87 y.o. female past medical history of essential hypertension, diabetes mellitus type 2 comes into the ED with left upper extremity weakness MRI of the brain showed acute patchy infarcts in the right frontotemporal lobe, MRA showed no significant stenosis.   Assessment/Plan:   Right parietal CVA: MRI positive.  LDL 75 on a statin.   A1c 8.8 No events on Telemetry monitoring Neurology was consulted recommended aspirin and Plavix for 3 weeks then aspirin alone. Ct angio on head and neck pending. Physical therapy evaluated the patient the recommended CIR. Allow permissive hypertension.  Essential hypertension: Currently on clonidine Norvasc metoprolol and losartan blood pressure is improving.  Diabetes mellitus type 2: Noninsulin-dependent on metformin at home, A1c 8.8 Require minimal insulin, will go ahead and restart her back on her metformin as an outpatient. For now we will start her on long-acting insulin.  Continue sliding scale.  Hypothyroidism: Continue Synthroid.  Anxiety: Continue current home regimen.    DVT prophylaxis: lovenox Family Communication:none Status is: Inpatient  Remains inpatient appropriate because:Hemodynamically unstable  Dispo: The patient is from: Home              Anticipated d/c is to: CIR              Patient currently is medically stable to d/c.   Difficult to place patient No        Code Status:     Code Status Orders  (From admission, onward)           Start     Ordered   12/30/20 2019  Full code  Continuous        12/30/20 2018           Code Status History     Date Active Date Inactive Code Status Order ID Comments User Context   04/27/2015 1752 04/28/2015 1604 Full Code [id number]  Marrian Salvage, MD Inpatient   07/26/2014 1424  07/27/2014 1841 Full Code [id number]  Runell Gess, MD Inpatient   07/24/2014 2002 07/26/2014 1424 Full Code [id number]  Rollene Rotunda, MD Inpatient   06/03/2013 1305 06/05/2013 1807 Full Code [id number]  Minor, Vilinda Blanks, NP ED   04/03/2013 1137 04/06/2013 1400 Full Code [id number]  Christia Reading, MD Inpatient      Advance Directive Documentation    Flowsheet Row Most Recent Value  Type of Advance Directive Healthcare Power of Attorney, Living will  Pre-existing out of facility DNR order (yellow form or pink MOST form) --  "MOST" Form in Place? --         IV Access:   Peripheral IV   Procedures and diagnostic studies:   ECHOCARDIOGRAM COMPLETE  Result Date: 12/31/2020    ECHOCARDIOGRAM REPORT   Patient Name:   Jo Larson Date of Exam: 12/31/2020 Medical Rec #:  [id number]      Height:       60.0 in Accession #:    [id number]     Weight:       164.0 lb Date of Birth:  August 24, 1931       BSA:          1.716 m Patient Age:    89 years       BP:           171/74 mmHg  Patient Gender: F              HR:           71 bpm. Exam Location:  Inpatient Procedure: 2D Echo, Cardiac Doppler and Color Doppler Indications:    Stroke I63.9  History:        Patient has prior history of Echocardiogram examinations, most                 recent 08/06/2014.  Sonographer:    Roosvelt Maserachel Lane RDCS Referring Phys: [id number] Teddy SpikeYRONE A KYLE IMPRESSIONS  1. Left ventricular ejection fraction, by estimation, is 60 to 65%. The left ventricle has normal function. The left ventricle has no regional wall motion abnormalities. There is moderate concentric left ventricular hypertrophy. Left ventricular diastolic parameters are consistent with Grade I diastolic dysfunction (impaired relaxation).  2. Right ventricular systolic function is normal. The right ventricular size is normal. Mildly increased right ventricular wall thickness. Tricuspid regurgitation signal is inadequate for assessing PA pressure.  3. Left atrial size was mildly  dilated.  4. The mitral valve is normal in structure. Trivial mitral valve regurgitation.  5. The aortic valve is tricuspid. There is mild calcification of the aortic valve. There is mild thickening of the aortic valve. Aortic valve regurgitation is trivial. Mild to moderate aortic valve sclerosis/calcification is present, without any evidence of aortic stenosis. Comparison(s): Compared to prior echo report in 2016, there is no significant change. Moderate LVH is noted on current study. Conclusion(s)/Recommendation(s): No intracardiac source of embolism detected on this transthoracic study. A transesophageal echocardiogram is recommended to exclude cardiac source of embolism if clinically indicated. FINDINGS  Left Ventricle: Left ventricular ejection fraction, by estimation, is 60 to 65%. The left ventricle has normal function. The left ventricle has no regional wall motion abnormalities. The left ventricular internal cavity size was normal in size. There is  moderate concentric left ventricular hypertrophy. Left ventricular diastolic parameters are consistent with Grade I diastolic dysfunction (impaired relaxation). Right Ventricle: The right ventricular size is normal. Mildly increased right ventricular wall thickness. Right ventricular systolic function is normal. Tricuspid regurgitation signal is inadequate for assessing PA pressure. Left Atrium: Left atrial size was mildly dilated. Right Atrium: Right atrial size was normal in size. Pericardium: There is no evidence of pericardial effusion. Mitral Valve: The mitral valve is normal in structure. There is mild thickening of the mitral valve leaflet(s). Trivial mitral valve regurgitation. Tricuspid Valve: The tricuspid valve is normal in structure. Tricuspid valve regurgitation is trivial. Aortic Valve: The aortic valve is tricuspid. There is mild calcification of the aortic valve. There is mild thickening of the aortic valve. Aortic valve regurgitation is  trivial. Mild to moderate aortic valve sclerosis/calcification is present, without any evidence of aortic stenosis. Aortic valve mean gradient measures 5.0 mmHg. Aortic valve peak gradient measures 12.5 mmHg. Aortic valve area, by VTI measures 2.22 cm. Pulmonic Valve: The pulmonic valve was normal in structure. Pulmonic valve regurgitation is trivial. Aorta: The aortic root is normal in size and structure. IAS/Shunts: No atrial level shunt detected by color flow Doppler.  LEFT VENTRICLE PLAX 2D LVIDd:         3.40 cm  Diastology LVIDs:         2.40 cm  LV e' medial:    5.77 cm/s LV PW:         1.30 cm  LV E/e' medial:  9.5 LV IVS:        1.20 cm  LV  e' lateral:   8.05 cm/s LVOT diam:     1.90 cm  LV E/e' lateral: 6.8 LV SV:         69 LV SV Index:   40 LVOT Area:     2.84 cm  RIGHT VENTRICLE RV Basal diam:  3.90 cm LEFT ATRIUM             Index       RIGHT ATRIUM           Index LA diam:        3.50 cm 2.04 cm/m  RA Area:     15.20 cm LA Vol (A2C):   65.4 ml 38.12 ml/m RA Volume:   35.20 ml  20.52 ml/m LA Vol (A4C):   50.1 ml 29.20 ml/m LA Biplane Vol: 59.6 ml 34.74 ml/m  AORTIC VALVE AV Area (Vmax):    1.89 cm AV Area (Vmean):   2.33 cm AV Area (VTI):     2.22 cm AV Vmax:           177.00 cm/s AV Vmean:          104.000 cm/s AV VTI:            0.309 m AV Peak Grad:      12.5 mmHg AV Mean Grad:      5.0 mmHg LVOT Vmax:         118.00 cm/s LVOT Vmean:        85.600 cm/s LVOT VTI:          0.242 m LVOT/AV VTI ratio: 0.78  AORTA Ao Root diam: 3.20 cm MITRAL VALVE MV Area (PHT): 2.75 cm    SHUNTS MV Decel Time: 276 msec    Systemic VTI:  0.24 m MV E velocity: 54.80 cm/s  Systemic Diam: 1.90 cm MV A velocity: 80.50 cm/s MV E/A ratio:  0.68 Laurance Flatten MD Electronically signed by Laurance Flatten MD Signature Date/Time: 12/31/2020/7:00:28 PM    Final      Medical Consultants:   None.   Subjective:    Jo Larson   Objective:    Vitals:   01/01/21 2013 01/02/21 0030 01/02/21 0440  01/02/21 0824  BP: (!) 156/59 (!) 109/52 (!) 118/56 (!) 155/80  Pulse:  (!) 54  (!) 51  Resp: 18 18 18 16   Temp:  98.2 F (36.8 C) 98 F (36.7 C) 97.9 F (36.6 C)  TempSrc:  Oral  Oral  SpO2:  95% 95% 93%  Weight:      Height:       SpO2: 93 %   Intake/Output Summary (Last 24 hours) at 01/02/2021 0913 Last data filed at 01/01/2021 1522 Gross per 24 hour  Intake 474 ml  Output 375 ml  Net 99 ml   Filed Weights   12/30/20 1059  Weight: 74.4 kg    Exam: General exam: In no acute distress. Respiratory system: Good air movement and clear to auscultation. Cardiovascular system: S1 & S2 heard, RRR. No JVD, murmurs, rubs, gallops or clicks.  Gastrointestinal system: Abdomen is nondistended, soft and nontender.  Central nervous system: Alert and oriented. No focal neurological deficits. Extremities: No pedal edema. Skin: No rashes, lesions or ulcers Psychiatry: Judgement and insight appear normal. Mood & affect appropriate.    Data Reviewed:    Labs: Basic Metabolic Panel: Recent Labs  Lab 12/30/20 1039 12/30/20 1208 01/01/21 1300  NA 140 139 138  K 4.3 4.3 4.1  CL 106 105 107  CO2  25  --  24  GLUCOSE 271* 280* 161*  BUN CREATININE 0.72 0.60 0.62  CALCIUM 10.0  --  9.7   GFR Estimated Creatinine Clearance: 44.7 mL/min (by C-G formula based on SCr of 0.62 mg/dL). Liver Function Tests: Recent Labs  Lab 12/30/20 1039  AST 17  ALT 17  ALKPHOS 56  BILITOT 0.3  PROT 7.4  ALBUMIN 3.8   No results for input(s): LIPASE, AMYLASE in the last 168 hours. No results for input(s): AMMONIA in the last 168 hours. Coagulation profile Recent Labs  Lab 12/30/20 1039  INR 0.9   COVID-19 Labs  No results for input(s): DDIMER, FERRITIN, LDH, CRP in the last 72 hours.  Lab Results  Component Value Date   SARSCOV2NAA NEGATIVE 12/30/2020    CBC: Recent Labs  Lab 12/30/20 1039 12/30/20 1208  WBC 9.7  --   NEUTROABS 5.4  --   HGB 12.5 11.6*  HCT 39.7  34.0*  MCV 91.1  --   PLT 301  --    Cardiac Enzymes: No results for input(s): CKTOTAL, CKMB, CKMBINDEX, TROPONINI in the last 168 hours. BNP (last 3 results) No results for input(s): PROBNP in the last 8760 hours. CBG: Recent Labs  Lab 01/01/21 1303 01/01/21 1532 01/01/21 1652 01/01/21 2127 01/02/21 0634  GLUCAP 440* 361* 335* 109* 181*   D-Dimer: No results for input(s): DDIMER in the last 72 hours. Hgb A1c: No results for input(s): HGBA1C in the last 72 hours. Lipid Profile: Recent Labs    12/31/20 0301  CHOL 153  HDL 31*  LDLCALC 75  TRIG 098*  CHOLHDL 4.9   Thyroid function studies: No results for input(s): TSH, T4TOTAL, T3FREE, THYROIDAB in the last 72 hours.  Invalid input(s): FREET3 Anemia work up: No results for input(s): VITAMINB12, FOLATE, FERRITIN, TIBC, IRON, RETICCTPCT in the last 72 hours. Sepsis Labs: Recent Labs  Lab 12/30/20 1039  WBC 9.7   Microbiology Recent Results (from the past 240 hour(s))  Resp Panel by RT-PCR (Flu A&B, Covid) Nasopharyngeal Swab     Status: None   Collection Time: 12/30/20 12:53 PM   Specimen: Nasopharyngeal Swab; Nasopharyngeal(NP) swabs in vial transport medium  Result Value Ref Range Status   SARS Coronavirus 2 by RT PCR NEGATIVE NEGATIVE Final    Comment: (NOTE) SARS-CoV-2 target nucleic acids are NOT DETECTED.  The SARS-CoV-2 RNA is generally detectable in upper respiratory specimens during the acute phase of infection. The lowest concentration of SARS-CoV-2 viral copies this assay can detect is 138 copies/mL. A negative result does not preclude SARS-Cov-2 infection and should not be used as the sole basis for treatment or other patient management decisions. A negative result may occur with  improper specimen collection/handling, submission of specimen other than nasopharyngeal swab, presence of viral mutation(s) within the areas targeted by this assay, and inadequate number of viral copies(<138 copies/mL).  A negative result must be combined with clinical observations, patient history, and epidemiological information. The expected result is Negative.  Fact Sheet for Patients:  BloggerCourse.com  Fact Sheet for Healthcare Providers:  SeriousBroker.it  This test is no t yet approved or cleared by the [place] FDA and  has been authorized for detection and/or diagnosis of SARS-CoV-2 by FDA under an Emergency Use Authorization (EUA). This EUA will remain  in effect (meaning this test can be used) for the duration of the COVID-19 declaration under Section 564(b)(1) of the Act, 21 U.S.C.section 360bbb-3(b)(1), unless the authorization is terminated  or revoked sooner.       Influenza A by PCR NEGATIVE NEGATIVE Final   Influenza B by PCR NEGATIVE NEGATIVE Final    Comment: (NOTE) The Xpert Xpress SARS-CoV-2/FLU/RSV plus assay is intended as an aid in the diagnosis of influenza from Nasopharyngeal swab specimens and should not be used as a sole basis for treatment. Nasal washings and aspirates are unacceptable for Xpert Xpress SARS-CoV-2/FLU/RSV testing.  Fact Sheet for Patients: BloggerCourse.com  Fact Sheet for Healthcare Providers: SeriousBroker.it  This test is not yet approved or cleared by the [place] FDA and has been authorized for detection and/or diagnosis of SARS-CoV-2 by FDA under an Emergency Use Authorization (EUA). This EUA will remain in effect (meaning this test can be used) for the duration of the COVID-19 declaration under Section 564(b)(1) of the Act, 21 U.S.C. section 360bbb-3(b)(1), unless the authorization is terminated or revoked.  Performed at [facility], [address], [place], [place] [address]      Medications:    amLODipine  10 mg Oral Daily   aspirin EC  81 mg Oral Daily   atorvastatin  40 mg Oral Daily   cloNIDine   0.1 mg Oral BID   clopidogrel  75 mg Oral Daily   insulin aspart  0-15 Units Subcutaneous TID WC   insulin aspart  0-5 Units Subcutaneous QHS   levothyroxine  88 mcg Oral Daily   losartan  100 mg Oral Daily   metoprolol tartrate  12.5 mg Oral BID   sertraline  100 mg Oral Daily   Continuous Infusions:    LOS: 3 days   Marinda Elk  [facility]  01/02/2021, 9:13 AM

## 2021-01-02 NOTE — Care Management Important Message (Signed)
Important Message  Patient Details  Name: Jo Larson MRN: [id number] Date of Birth: 1932/04/22   Medicare Important Message Given:  Yes     Villa Burgin Stefan Church 01/02/2021, 3:42 PM

## 2021-01-02 NOTE — Progress Notes (Signed)
Plan of care explained and neuro assessment performed using interpreter Snbzana  with # [phone]. Patient verbalized understanding. Will continue to monitor.

## 2021-01-02 NOTE — Progress Notes (Signed)
Physical Therapy Treatment Patient Details Name: Jo Larson MRN: [id number] DOB: [birth year] Today's Date: 01/02/2021    History of Present Illness This 87 y.o. female admitted with 1 day history of Lt UE weakness, and impaired balance.  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  PMH includes: angioedema, anxiety, CAD, DM.    PT Comments    Pt progressing towards goals. Reporting not feeling well, but was not able to describe how she felt bad. Required mod A to stand and min to mod A to take side steps at EOB. Pt tended to sit prematurely following a few steps. Current recommendations for CIR appropriate. Will continue to follow acutely.   Ipad video interpreter-[name]- [id number]   Follow Up Recommendations  CIR     Equipment Recommendations  Other (comment) (TBD)    Recommendations for Other Services       Precautions / Restrictions Precautions Precautions: Fall Restrictions Weight Bearing Restrictions: No    Mobility  Bed Mobility Overal bed mobility: Needs Assistance Bed Mobility: Supine to Sit;Sit to Supine     Supine to sit: Mod assist Sit to supine: Min assist   General bed mobility comments: Mod A for trunk and to scoot hips to EOB. Min A for LE assist for return to supine.    Transfers Overall transfer level: Needs assistance Equipment used: 1 person hand held assist Transfers: Sit to/from Stand Sit to Stand: Mod assist         General transfer comment: Mod A for lift assist and steadying to stand.  Ambulation/Gait Ambulation/Gait assistance: Mod assist;Min assist   Assistive device: 1 person hand held assist       General Gait Details: PT stood in front of pt and had pt hold to PT arm using her RUE. Pt requiring assist to weight shift and tended to sit prematurely. Was able to take side steps at EOB with min to mod A. Did not have recliner in room, so was unable to sit in recliner.   Stairs              Wheelchair Mobility    Modified Rankin (Stroke Patients Only) Modified Rankin (Stroke Patients Only) Pre-Morbid Rankin Score: Moderate disability Modified Rankin: Moderately severe disability     Balance Overall balance assessment: Needs assistance Sitting-balance support: No upper extremity supported Sitting balance-Leahy Scale: Fair     Standing balance support: Single extremity supported Standing balance-Leahy Scale: Poor Standing balance comment: Requires at least one UE support and external support                            Cognition Arousal/Alertness: Awake/alert Behavior During Therapy: WFL for tasks assessed/performed Overall Cognitive Status: Difficult to assess                                        Exercises General Exercises - Lower Extremity Long Arc Quad: AROM;Both;10 reps;Seated (with manual cues)    General Comments        Pertinent Vitals/Pain Pain Assessment: Faces Faces Pain Scale: No hurt    Home Living                      Prior Function            PT Goals (current goals can now be found  in the care plan section) Acute Rehab PT Goals Patient Stated Goal: to feel better PT Goal Formulation: With family Time For Goal Achievement: 01/14/21 Potential to Achieve Goals: Good Progress towards PT goals: Progressing toward goals    Frequency    Min 4X/week      PT Plan Current plan remains appropriate    Co-evaluation              AM-PAC PT "6 Clicks" Mobility   Outcome Measure  Help needed turning from your back to your side while in a flat bed without using bedrails?: A Little Help needed moving from lying on your back to sitting on the side of a flat bed without using bedrails?: A Lot Help needed moving to and from a bed to a chair (including a wheelchair)?: A Lot Help needed standing up from a chair using your arms (e.g., wheelchair or bedside chair)?: A Lot Help needed to walk in  hospital room?: A Lot Help needed climbing 3-5 steps with a railing? : Total 6 Click Score: 12    End of Session Equipment Utilized During Treatment: Gait belt Activity Tolerance: Treatment limited secondary to medical complications (Comment) (pt reports not feeling well) Patient left: in bed;with call bell/phone within reach (bed alarm not working as plugs not working to turn bed on; Research scientist (life sciences)) Nurse Communication: Mobility status PT Visit Diagnosis: Unsteadiness on feet (R26.81);Muscle weakness (generalized) (M62.81);Other abnormalities of gait and mobility (R26.89);Ataxic gait (R26.0)     Time: 3267-1245 PT Time Calculation (min) (ACUTE ONLY): 16 min  Charges:  $Therapeutic Activity: 8-22 mins                     Cindee Salt, DPT  Acute Rehabilitation Services  Pager: [phone] Office: [phone]    Lehman Prom 01/02/2021, 11:20 AM

## 2021-01-03 ENCOUNTER — Encounter (HOSPITAL_COMMUNITY): Payer: Self-pay | Admitting: Internal Medicine

## 2021-01-03 ENCOUNTER — Encounter (HOSPITAL_COMMUNITY)
Admission: EM | Disposition: A | Discharge status: Rehabilitation Facility | Payer: Self-pay | Source: Home / Self Care | Attending: Internal Medicine

## 2021-01-03 ENCOUNTER — Inpatient Hospital Stay (HOSPITAL_COMMUNITY): Payer: Medicare Other | Admitting: Certified Registered Nurse Anesthetist

## 2021-01-03 ENCOUNTER — Inpatient Hospital Stay (HOSPITAL_COMMUNITY): Payer: Medicare Other

## 2021-01-03 DIAGNOSIS — I63231 Cerebral infarction due to unspecified occlusion or stenosis of right carotid arteries: Secondary | ICD-10-CM | POA: Diagnosis not present

## 2021-01-03 DIAGNOSIS — E7800 Pure hypercholesterolemia, unspecified: Secondary | ICD-10-CM | POA: Diagnosis not present

## 2021-01-03 DIAGNOSIS — E119 Type 2 diabetes mellitus without complications: Secondary | ICD-10-CM | POA: Diagnosis not present

## 2021-01-03 DIAGNOSIS — I10 Essential (primary) hypertension: Secondary | ICD-10-CM | POA: Diagnosis not present

## 2021-01-03 HISTORY — PX: IR ANGIO INTRA EXTRACRAN SEL COM CAROTID INNOMINATE BILAT MOD SED: IMG5360

## 2021-01-03 HISTORY — PX: IR US GUIDE VASC ACCESS RIGHT: IMG2390

## 2021-01-03 HISTORY — PX: IR INTRAVSC STENT CERV CAROTID W/EMB-PROT MOD SED INCL ANGIO: IMG2303

## 2021-01-03 HISTORY — PX: RADIOLOGY WITH ANESTHESIA: SHX6223

## 2021-01-03 HISTORY — PX: IR ANGIO VERTEBRAL SEL VERTEBRAL UNI R MOD SED: IMG5368

## 2021-01-03 LAB — GLUCOSE, CAPILLARY
Glucose-Capillary: 162 mg/dL — ABNORMAL HIGH (ref 70–99)
Glucose-Capillary: 170 mg/dL — ABNORMAL HIGH (ref 70–99)
Glucose-Capillary: 105 mg/dL — ABNORMAL HIGH (ref 70–99)
Glucose-Capillary: 144 mg/dL — ABNORMAL HIGH (ref 70–99)
Glucose-Capillary: 203 mg/dL — ABNORMAL HIGH (ref 70–99)

## 2021-01-03 LAB — POCT ACTIVATED CLOTTING TIME
Activated Clotting Time: 208 seconds
Activated Clotting Time: 202 seconds
Activated Clotting Time: 202 seconds

## 2021-01-03 SURGERY — IR WITH ANESTHESIA
Anesthesia: Monitor Anesthesia Care | Laterality: Right

## 2021-01-03 MED ORDER — EPHEDRINE SULFATE 50 MG/ML IJ SOLN
INTRAMUSCULAR | Status: DC | PRN
  Administered 2021-01-03: 10 mg via INTRAVENOUS

## 2021-01-03 MED ORDER — ORAL CARE MOUTH RINSE
15.0000 mL | Freq: Once | OROMUCOSAL | Status: AC
Start: 2021-01-03 — End: 2021-01-03

## 2021-01-03 MED ORDER — IOHEXOL 240 MG/ML SOLN
150.0000 mL | Freq: Once | INTRAMUSCULAR | Status: AC | PRN
  Administered 2021-01-03: 61 mL via INTRAVENOUS

## 2021-01-03 MED ORDER — HEPARIN SODIUM (PORCINE) 1000 UNIT/ML IJ SOLN
INTRAMUSCULAR | Status: AC
  Filled 2021-01-03: qty 1

## 2021-01-03 MED ORDER — IOHEXOL 240 MG/ML SOLN
INTRAMUSCULAR | Status: AC
  Filled 2021-01-03: qty 100

## 2021-01-03 MED ORDER — ACETAMINOPHEN 500 MG PO TABS
1000.0000 mg | ORAL_TABLET | Freq: Once | ORAL | Status: AC
  Administered 2021-01-03: 1000 mg via ORAL
  Filled 2021-01-03: qty 2

## 2021-01-03 MED ORDER — NITROGLYCERIN 1 MG/10 ML FOR IR/CATH LAB
INTRA_ARTERIAL | Status: AC
  Filled 2021-01-03: qty 10

## 2021-01-03 MED ORDER — LIDOCAINE HCL (PF) 1 % IJ SOLN
INTRAMUSCULAR | Status: AC
  Filled 2021-01-03: qty 30

## 2021-01-03 MED ORDER — ONDANSETRON HCL 4 MG/2ML IJ SOLN
4.0000 mg | Freq: Four times a day (QID) | INTRAMUSCULAR | Status: DC | PRN
  Administered 2021-01-03 – 2021-01-04 (×2): 4 mg via INTRAVENOUS
  Filled 2021-01-03 (×2): qty 2

## 2021-01-03 MED ORDER — VERAPAMIL HCL 2.5 MG/ML IV SOLN
INTRAVENOUS | Status: AC
  Filled 2021-01-03: qty 2

## 2021-01-03 MED ORDER — FENTANYL CITRATE (PF) 100 MCG/2ML IJ SOLN
INTRAMUSCULAR | Status: DC | PRN
  Administered 2021-01-03 (×2): 25 ug via INTRAVENOUS

## 2021-01-03 MED ORDER — HEPARIN SODIUM (PORCINE) 1000 UNIT/ML IJ SOLN
INTRAMUSCULAR | Status: DC | PRN
  Administered 2021-01-03 (×2): 1000 [IU] via INTRAVENOUS

## 2021-01-03 MED ORDER — CHLORHEXIDINE GLUCONATE CLOTH 2 % EX PADS
6.0000 | MEDICATED_PAD | Freq: Every day | CUTANEOUS | Status: DC
  Administered 2021-01-04: 6 via TOPICAL

## 2021-01-03 MED ORDER — CLEVIDIPINE BUTYRATE 0.5 MG/ML IV EMUL
INTRAVENOUS | Status: AC
  Filled 2021-01-03: qty 50

## 2021-01-03 MED ORDER — ONDANSETRON HCL 4 MG/2ML IJ SOLN
INTRAMUSCULAR | Status: AC
  Filled 2021-01-03: qty 2

## 2021-01-03 MED ORDER — FENTANYL CITRATE (PF) 100 MCG/2ML IJ SOLN
INTRAMUSCULAR | Status: AC
  Filled 2021-01-03: qty 2

## 2021-01-03 MED ORDER — FENTANYL CITRATE (PF) 100 MCG/2ML IJ SOLN: 25.0000 ug | INTRAMUSCULAR | Status: DC | PRN

## 2021-01-03 MED ORDER — LACTATED RINGERS IV SOLN: INTRAVENOUS | Status: DC

## 2021-01-03 MED ORDER — PROPOFOL 500 MG/50ML IV EMUL
INTRAVENOUS | Status: DC | PRN
  Administered 2021-01-03: 50 ug/kg/min via INTRAVENOUS

## 2021-01-03 MED ORDER — CLEVIDIPINE BUTYRATE 0.5 MG/ML IV EMUL
0.0000 mg/h | INTRAVENOUS | Status: DC
  Administered 2021-01-03: 1 mg/h via INTRAVENOUS
  Administered 2021-01-03: 7 mg/h via INTRAVENOUS
  Administered 2021-01-04: 10 mg/h via INTRAVENOUS
  Filled 2021-01-03: qty 50
  Filled 2021-01-03: qty 100
  Filled 2021-01-03 (×4): qty 50

## 2021-01-03 MED ORDER — CHLORHEXIDINE GLUCONATE 0.12 % MT SOLN
15.0000 mL | Freq: Once | OROMUCOSAL | Status: AC
Start: 2021-01-03 — End: 2021-01-03
  Administered 2021-01-03: 15 mL via OROMUCOSAL
  Filled 2021-01-03: qty 15

## 2021-01-03 MED ORDER — GLYCOPYRROLATE 0.2 MG/ML IJ SOLN
INTRAMUSCULAR | Status: DC | PRN
  Administered 2021-01-03 (×2): .1 mg via INTRAVENOUS

## 2021-01-03 MED ORDER — PHENYLEPHRINE 40 MCG/ML (10ML) SYRINGE FOR IV PUSH (FOR BLOOD PRESSURE SUPPORT)
PREFILLED_SYRINGE | INTRAVENOUS | Status: DC | PRN
  Administered 2021-01-03: 80 ug via INTRAVENOUS

## 2021-01-03 MED ORDER — PHENYLEPHRINE HCL-NACL 10-0.9 MG/250ML-% IV SOLN
INTRAVENOUS | Status: DC | PRN
  Administered 2021-01-03: 20 ug/min via INTRAVENOUS

## 2021-01-03 NOTE — Progress Notes (Signed)
Occupational Therapy Treatment Patient Details Name: Jo Larson MRN: [id number] DOB: 18-May-1932 Today's Date: 01/03/2021    History of present illness This 87 y.o. female admitted with 1 day history of Lt UE weakness, and impaired balance.  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  PMH includes: angioedema, anxiety, CAD, DM.   OT comments  iPad [employer] [phone] used throughout session. Pt progressing towards OT goals this session, her daughter in law was present as well and was helpful for interpretation (Pt frequently looked to her instead of iPad interpreter) Pt was min A +2 for safe transfers, able to ambulate to door and back with 2 person HHA and no need for chair follow for that distance. Grasp in LUE is too weak for regular RW. Once seated in chair Pt assisted for grooming task, and further evaluation of LUE - exercises as listed below. CIR continues to be best post-acute placement to maximize safety and independence in ADL and functional transfers.    Follow Up Recommendations  CIR    Equipment Recommendations  None recommended by OT    Recommendations for Other Services Rehab consult    Precautions / Restrictions Precautions Precautions: Fall Restrictions Weight Bearing Restrictions: No       Mobility Bed Mobility Overal bed mobility: Needs Assistance Bed Mobility: Supine to Sit     Supine to sit: Min assist;+2 for safety/equipment     General bed mobility comments: Min A for trunk and to scoot hips to EOB.    Transfers Overall transfer level: Needs assistance Equipment used: 2 person hand held assist Transfers: Sit to/from Stand Sit to Stand: +2 safety/equipment;Min assist         General transfer comment: Min A for lift assist +2 for steadying to stand. and for balance during dynamic mobility    Balance Overall balance assessment: Needs assistance Sitting-balance support: No upper extremity  supported Sitting balance-Leahy Scale: Fair     Standing balance support: Bilateral upper extremity supported Standing balance-Leahy Scale: Poor Standing balance comment: Requires at least one UE support and external support                           ADL either performed or assessed with clinical judgement   ADL Overall ADL's : Needs assistance/impaired     Grooming: Wash/dry face;Sitting;Minimal assistance Grooming Details (indicate cue type and reason): EOB                 Toilet Transfer: Minimal assistance;+2 for safety/equipment;+2 for physical assistance;Ambulation;Regular Teacher, adult education Details (indicate cue type and reason): 2 person HHA, grasp too weak for RW currently Toileting- Clothing Manipulation and Hygiene: Maximal assistance;Sit to/from stand       Functional mobility during ADLs: Minimal assistance;+2 for physical assistance;+2 for safety/equipment       Vision       Perception     Praxis      Cognition Arousal/Alertness: Awake/alert Behavior During Therapy: WFL for tasks assessed/performed Overall Cognitive Status: Within Functional Limits for tasks assessed                                          Exercises Exercises: Other exercises Other Exercises Other Exercises: PROM of digits Other Exercises: PROM of thumb Other Exercises: elbow ROM - AROM Other Exercises: shoulder AAROM  Shoulder Instructions       General Comments RN notified of need to replace purewick and that bed is not functioning properly, nursing secretary also notified    Pertinent Vitals/ Pain       Pain Assessment: Faces Faces Pain Scale: Hurts a little bit Pain Location: headache Pain Descriptors / Indicators: Headache Pain Intervention(s): Limited activity within patient's tolerance;Monitored during session;Repositioned  Home Living                                          Prior Functioning/Environment               Frequency  Min 2X/week        Progress Toward Goals  OT Goals(current goals can now be found in the care plan section)  Progress towards OT goals: Progressing toward goals  Acute Rehab OT Goals Patient Stated Goal: to feel better OT Goal Formulation: With family Time For Goal Achievement: 01/14/21 Potential to Achieve Goals: Good  Plan Discharge plan remains appropriate    Co-evaluation    PT/OT/SLP Co-Evaluation/Treatment: Yes Reason for Co-Treatment: For patient/therapist safety;To address functional/ADL transfers;Other (comment) (use of translator) PT goals addressed during session: Balance;Mobility/safety with mobility;Strengthening/ROM OT goals addressed during session: ADL's and self-care;Strengthening/ROM      AM-PAC OT "6 Clicks" Daily Activity     Outcome Measure   Help from another person eating meals?: None Help from another person taking care of personal grooming?: A Little Help from another person toileting, which includes using toliet, bedpan, or urinal?: A Lot Help from another person bathing (including washing, rinsing, drying)?: A Lot Help from another person to put on and taking off regular upper body clothing?: A Lot Help from another person to put on and taking off regular lower body clothing?: A Lot 6 Click Score: 15    End of Session Equipment Utilized During Treatment: Gait belt  OT Visit Diagnosis: Unsteadiness on feet (R26.81);Hemiplegia and hemiparesis Hemiplegia - Right/Left: Left Hemiplegia - dominant/non-dominant: Dominant Hemiplegia - caused by: Cerebral infarction   Activity Tolerance Patient tolerated treatment well   Patient Left in chair;with call bell/phone within reach;with chair alarm set;with family/visitor present   Nurse Communication Mobility status (need for purewick, bed is not operating correctly)        Time: 1610-9604 OT Time Calculation (min): 23 min  Charges: OT General Charges $OT Visit: 1  Visit OT Treatments $Therapeutic Activity: 8-22 mins Nyoka Cowden OTR/L Acute Rehabilitation Services Pager: [phone] Office: [phone]    Evern Bio Gricel Copen 01/03/2021, 2:04 PM

## 2021-01-03 NOTE — Anesthesia Preprocedure Evaluation (Addendum)
Anesthesia Evaluation  Patient identified by MRN, date of birth, ID band Patient awake    Reviewed: Allergy & Precautions, NPO status , Patient's Chart, lab work & pertinent test results  History of Anesthesia Complications Negative for: history of anesthetic complications  Airway Mallampati: III  TM Distance: >3 FB Neck ROM: Full    Dental  (+) Edentulous Upper, Edentulous Lower   Pulmonary neg pulmonary ROS,    Pulmonary exam normal breath sounds clear to auscultation       Cardiovascular hypertension, + CAD and + Past MI  Normal cardiovascular exam Rhythm:Regular Rate:Normal  TTE 12/31/20: EF 60-65%, moderate LVH, grade I DD, mildly increased RV wall thickness, mild LAE, valves ok   Neuro/Psych Anxiety CVA (left sided weakness )    GI/Hepatic negative GI ROS, Neg liver ROS,   Endo/Other  diabetes, Type 2Hypothyroidism   Renal/GU negative Renal ROS  negative genitourinary   Musculoskeletal  (+) Arthritis ,   Abdominal   Peds  Hematology negative hematology ROS (+)   Anesthesia Other Findings Day of surgery medications reviewed with patient.  Reproductive/Obstetrics negative OB ROS                            Anesthesia Physical Anesthesia Plan  ASA: 4  Anesthesia Plan: MAC   Post-op Pain Management:    Induction:   PONV Risk Score and Plan: 3 and Treatment may vary due to age or medical condition, Propofol infusion and TIVA  Airway Management Planned: Natural Airway and Simple Face Mask  Additional Equipment: Arterial line  Intra-op Plan:   Post-operative Plan:   Informed Consent: I have reviewed the patients History and Physical, chart, labs and discussed the procedure including the risks, benefits and alternatives for the proposed anesthesia with the patient or authorized representative who has indicated his/her understanding and acceptance.     Dental advisory given and  Interpreter used for interveiw  Plan Discussed with: CRNA  Anesthesia Plan Comments:       Anesthesia Quick Evaluation

## 2021-01-03 NOTE — Transfer of Care (Signed)
Immediate Anesthesia Transfer of Care Note  Patient: Draya Hershberger  Procedure(s) Performed: IR WITH ANESTHESIA - CAROTID STENT (Right)  Patient Location: PACU  Anesthesia Type:MAC  Level of Consciousness: awake and alert   Airway & Oxygen Therapy: Patient connected to face mask oxygen  Post-op Assessment: Post -op Vital signs reviewed and stable  Post vital signs: Reviewed and stable  Last Vitals:  Vitals Value Taken Time  BP 120/51 01/03/21 1600  Temp    Pulse 52 01/03/21 1602  Resp 17 01/03/21 1602  SpO2 100 % 01/03/21 1602  Vitals shown include unvalidated device data.  Last Pain:  Vitals:   01/03/21 1250  TempSrc:   PainSc: 0-No pain         Complications: No notable events documented.

## 2021-01-03 NOTE — Sedation Documentation (Signed)
Right radial sheath removed, TR band applied with 11cc @ 1542.

## 2021-01-03 NOTE — Progress Notes (Signed)
Physical Therapy Treatment Patient Details Name: Jo Larson MRN: [id number] DOB: 1932-01-02 Today's Date: 01/03/2021    History of Present Illness This 87 y.o. female admitted with 1 day history of Lt UE weakness, and impaired balance.  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  PMH includes: angioedema, anxiety, CAD, DM.    PT Comments    Pt admitted with above diagnosis. Pt progressed ambulation today.  Needs +2 HHA for safety.  Pt fatigues quickly. Daughter present and interpreter used.  Will continue to follow acutely.  Pt currently with functional limitations due to balance and endurance deficits. Pt will benefit from skilled PT to increase their independence and safety with mobility to allow discharge to the venue listed below.      Follow Up Recommendations  CIR     Equipment Recommendations  Other (comment) (TBD)    Recommendations for Other Services       Precautions / Restrictions Precautions Precautions: Fall Restrictions Weight Bearing Restrictions: No    Mobility  Bed Mobility Overal bed mobility: Needs Assistance Bed Mobility: Supine to Sit     Supine to sit: Min assist;+2 for safety/equipment Sit to supine: Min assist   General bed mobility comments: Min A for trunk and to scoot hips to EOB.    Transfers Overall transfer level: Needs assistance Equipment used: 2 person hand held assist Transfers: Sit to/from Stand Sit to Stand: +2 safety/equipment;Min assist Stand pivot transfers: Min assist;+2 physical assistance;+2 safety/equipment       General transfer comment: Min A for lift assist +2 for steadying to stand. and for balance during dynamic mobility  Ambulation/Gait Ambulation/Gait assistance: Min assist;+2 physical assistance Gait Distance (Feet): 35 Feet Assistive device: 1 person hand held assist;2 person hand held assist Gait Pattern/deviations: Step-to pattern;Decreased stride length;Decreased step  length - left;Decreased stance time - left;Decreased weight shift to left;Wide base of support Gait velocity: decreased   General Gait Details: Pt was able to progress ambulation with bil HHA.  left LE with decr step length but pt was able to clear foot.  Pt more unsteady when turning and backing up and needed cues for safety to center herself in front of chair.   Stairs             Wheelchair Mobility    Modified Rankin (Stroke Patients Only) Modified Rankin (Stroke Patients Only) Pre-Morbid Rankin Score: Moderate disability Modified Rankin: Moderately severe disability     Balance Overall balance assessment: Needs assistance Sitting-balance support: No upper extremity supported Sitting balance-Leahy Scale: Fair     Standing balance support: Bilateral upper extremity supported Standing balance-Leahy Scale: Poor Standing balance comment: Requires at least one UE support and external support                            Cognition Arousal/Alertness: Awake/alert Behavior During Therapy: WFL for tasks assessed/performed Overall Cognitive Status: Within Functional Limits for tasks assessed                                 General Comments: Ipad interpreter used      Exercises General Exercises - Lower Extremity Long Arc Quad: AROM;Both;10 reps;Seated (with manual cues) Hip Flexion/Marching: AROM;Both;5 reps;Seated Other Exercises Other Exercises: PROM of digits Other Exercises: PROM of thumb Other Exercises: elbow ROM - AROM Other Exercises: shoulder AAROM    General  Comments General comments (skin integrity, edema, etc.): RN notified of need to replace purewick and that bed is not functioning properly, nursing secretary also notified      Pertinent Vitals/Pain Pain Assessment: Faces Faces Pain Scale: Hurts a little bit Pain Location: headache Pain Descriptors / Indicators: Headache Pain Intervention(s): Limited activity within patient's  tolerance;Monitored during session;Repositioned    Home Living                      Prior Function            PT Goals (current goals can now be found in the care plan section) Acute Rehab PT Goals Patient Stated Goal: to feel better Progress towards PT goals: Progressing toward goals    Frequency    Min 4X/week      PT Plan Current plan remains appropriate    Co-evaluation PT/OT/SLP Co-Evaluation/Treatment: Yes Reason for Co-Treatment: Complexity of the patient's impairments (multi-system involvement);For patient/therapist safety PT goals addressed during session: Mobility/safety with mobility;Balance OT goals addressed during session: ADL's and self-care;Strengthening/ROM      AM-PAC PT "6 Clicks" Mobility   Outcome Measure  Help needed turning from your back to your side while in a flat bed without using bedrails?: A Little Help needed moving from lying on your back to sitting on the side of a flat bed without using bedrails?: A Lot Help needed moving to and from a bed to a chair (including a wheelchair)?: A Lot Help needed standing up from a chair using your arms (e.g., wheelchair or bedside chair)?: A Lot Help needed to walk in hospital room?: A Lot Help needed climbing 3-5 steps with a railing? : Total 6 Click Score: 12    End of Session Equipment Utilized During Treatment: Gait belt Activity Tolerance: Patient limited by fatigue Patient left: with call bell/phone within reach;in chair;with chair alarm set;with family/visitor present Nurse Communication: Mobility status (Bed not working, purewick to be replaced) PT Visit Diagnosis: Unsteadiness on feet (R26.81);Muscle weakness (generalized) (M62.81);Other abnormalities of gait and mobility (R26.89);Ataxic gait (R26.0)     Time: 1106-1130 PT Time Calculation (min) (ACUTE ONLY): 24 min  Charges:  $Gait Training: 8-22 mins                     Jo Larson,PT Acute Rehab  Services [phone] (pager)    Jo Larson 01/03/2021, 2:46 PM

## 2021-01-03 NOTE — Progress Notes (Signed)
Inpatient Rehab Admissions Coordinator:   I spoke with Pt. Regarding CIR admission with use of translator. All questions answered. I continue to await a decision from Pt.'s insurance.  Megan Salon, MS, CCC-SLP Rehab Admissions Coordinator  [phone] (office)'

## 2021-01-03 NOTE — Progress Notes (Addendum)
**Note Jo-Identified via Obfuscation** NIR consulted by Dr. Roda Shutters for possible image-guided diagnostic cerebral arteriogram. Please see consult note from Loyce Dys, PA-C from 01/02/2021 for formal work-up.  Called patient's grandson, Jo Nurse, to discuss possible procedure for today. Jo Nurse requests possible in person interpreter for his grandmother- unfortunately, we do not have access to an in-person [place] interpreter at [facility]. Jo Nurse conveys understanding about this. States that he had a chance to speak with his mother regarding procedure- at this time, family agrees to move forward with procedure.  Plan for image-guided diagnostic cerebral arteriogram in IR with Dr. Tommie Sams tentatively for today pending IR scheduling. Patient is NPO. Afebrile. Ok to proceed with Plavix/Aspirin per Dr. Tommie Sams- please ensure patient receives these medications this AM prior to procedure. INR 0.9 12/30/2020. Further plans per TRH/neurology- appreciate and agree with management.  Risks and benefits of diagnostic cerebral angiogram were discussed with the patient including, but not limited to bleeding, infection, vascular injury, stroke, or contrast induced renal failure. This interventional procedure involves the use of X-rays and because of the nature of the planned procedure, it is possible that we will have prolonged use of X-ray fluoroscopy. Potential radiation risks to you include (but are not limited to) the following: - A slightly elevated risk for cancer  several years later in life. This risk is typically less than 0.5% percent. This risk is low in comparison to the normal incidence of human cancer, which is 33% for women and 50% for men according to the American Cancer Society. - Radiation induced injury can include skin redness, resembling a rash, tissue breakdown / ulcers and hair loss (which can be temporary or permanent).  The likelihood of either of these occurring depends on the difficulty of the procedure and whether  you are sensitive to radiation due to previous procedures, disease, or genetic conditions.  IF your procedure requires a prolonged use of radiation, you will be notified and given written instructions for further action.  It is your responsibility to monitor the irradiated area for the 2 weeks following the procedure and to notify your physician if you are concerned that you have suffered a radiation induced injury.   All of the patient's grandson's questions were answered, he is agreeable to proceed. Consent obtained by patient's grandson, Jo Larson, via telephone- signed and in IR control room.   ADDENDUM: After further discussion with Dr. Tommie Sams plan for image-guided cerebral arteriogram with right ICA revascularization. Anesthesia booked for 1300 today. Consent re-obtained by Jo Larson.   Jo Boga Zaven Klemens, PA-C 01/03/2021, 10:10 AM

## 2021-01-03 NOTE — H&P (Signed)
HPI:  The patient has had a H&P performed within the last 30 days, all history, medications, and exam have been reviewed. The patient denies any interval changes since the H&P.  Patient awake and alert sitting in chair working with PT/OT. Daughter-in-law at bedside. Complains of headache, stable- otherwise no complaints.   Medications: Prior to Admission medications   Medication Sig Start Date End Date Taking? Authorizing Provider  acetaminophen (TYLENOL) 325 MG tablet Take 325 mg by mouth in the morning and at bedtime.   Yes [provider]  amLODipine (NORVASC) 10 MG tablet Take 1 tablet by mouth daily. 09/27/20  Yes [provider]  atorvastatin (LIPITOR) 20 MG tablet Take 1 tablet by mouth daily. 11/07/20  Yes [provider]  Cholecalciferol 25 MCG (1000 UT) capsule Take 1,000 Units by mouth daily.   Yes [provider]  cloNIDine (CATAPRES) 0.1 MG tablet Take 1 tablet by mouth 2 (two) times daily. 07/20/16  Yes [provider]  esomeprazole (NEXIUM) 40 MG capsule Take 1 capsule by mouth daily with breakfast. 08/20/16  Yes [provider]  glimepiride (AMARYL) 2 MG tablet Take 2 mg by mouth daily. 11/03/20  Yes [provider]  levothyroxine (SYNTHROID) 88 MCG tablet Take 88 mcg by mouth daily. 11/22/20  Yes [provider]  losartan (COZAAR) 100 MG tablet Take 1 tablet (100 mg total) by mouth daily. 08/21/19  Yes Rollene Rotunda, MD  metFORMIN (GLUCOPHAGE-XR) 500 MG 24 hr tablet Take 1,500 mg by mouth daily with breakfast. 12/01/20  Yes [provider]  metoprolol tartrate (LOPRESSOR) 25 MG tablet Take 12.5 mg by mouth 2 (two) times daily. 08/20/16  Yes [provider]  sertraline (ZOLOFT) 100 MG tablet Take 1 tablet by mouth daily. 11/03/20  Yes [provider]  amLODipine (NORVASC) 5 MG tablet Take 1 tablet (5 mg total) by mouth daily. 10/12/16 10/12/17  Rollene Rotunda, MD  azithromycin  (ZITHROMAX) 250 MG tablet Take 1 tablet (250 mg total) by mouth daily. Take first 2 tablets together, then 1 every day until finished. Patient not taking: Reported on 12/30/2020 09/03/16   Elson Areas, PA-C  diphenhydrAMINE (BENADRYL) 25 MG tablet Take 1 tablet (25 mg total) by mouth every 6 (six) hours as needed for allergies. Patient not taking: Reported on 12/30/2020 08/09/18   Arby Barrette, MD  famotidine (PEPCID) 20 MG tablet Take 1 tablet (20 mg total) by mouth 2 (two) times daily. Patient not taking: Reported on 12/30/2020 08/09/18   Arby Barrette, MD  LORazepam (ATIVAN) 1 MG tablet Take 1 tablet (1 mg total) by mouth 3 (three) times daily as needed for anxiety. Patient not taking: Reported on 12/30/2020 09/03/16   Elson Areas, PA-C  predniSONE (DELTASONE) 20 MG tablet 2 tabs po daily x 3 days Patient not taking: Reported on 12/30/2020 08/09/18   Arby Barrette, MD     Vital Signs: BP (!) 136/49 (BP Location: Left Arm)   Pulse (!) 48   Temp 98.1 F (36.7 C) (Oral)   Resp 16   Ht 5' (1.524 m)   Wt 164 lb (74.4 kg)   SpO2 95%   BMI 32.03 kg/m   Physical Exam Vitals and nursing note reviewed.  Constitutional:      General: She is not in acute distress.    Appearance: She is obese.  Cardiovascular:     Rate and Rhythm: Normal rate and regular rhythm.     Heart sounds: Normal heart sounds. No murmur  heard. Pulmonary:     Effort: Pulmonary effort is normal. No respiratory distress.     Breath sounds: Normal breath sounds. No wheezing.  Skin:    General: Skin is warm and dry.  Neurological:     Mental Status: She is alert.    Mallampati Score:  MD Evaluation Airway: WNL Heart: WNL Abdomen: WNL Chest/ Lungs: WNL ASA  Classification: 3 Mallampati/Airway Score: Two  Labs:  CBC: Recent Labs    12/30/20 1039 12/30/20 1208  WBC 9.7  --   HGB 12.5 11.6*  HCT 39.7 34.0*  PLT 301  --     COAGS: Recent Labs    12/30/20 1039  INR 0.9  APTT 31     BMP: Recent Labs    12/30/20 1039 12/30/20 1208 01/01/21 1300  NA 140 139 138  K 4.3 4.3 4.1  CL 106 105 107  CO2 25  --  24  GLUCOSE 271* 280* 161*  BUN 19 23 14   CALCIUM 10.0  --  9.7  CREATININE 0.72 0.60 0.62  GFRNONAA >60  --  >60    LIVER FUNCTION TESTS: Recent Labs    12/30/20 1039  BILITOT 0.3  AST 17  ALT 17  ALKPHOS 56  PROT 7.4  ALBUMIN 3.8    Assessment/Plan:   Right ICA stenosis. Plan for image-guided cerebral arteriogram with possible right ICA revascularization (angioplasty/stent placement) in IR with Dr. 03/01/21 and anesthesia tentatively for today at 1300 pending IR scheduling. Patient is NPO. Afebrile. Ok to proceed with Plavix/Aspirin per Dr. Tommie Sams- please ensure patient receives these medications this AM prior to procedure. INR 0.9 12/30/2020. COVID negative 12/30/2020. Further plans per TRH/neurology- appreciate and agree with management.  Tele-interpreter [phone] was present throughout today's interaction.  Risks and benefits of cerebral arteriogram with intervention were discussed with the patient including, but not limited to bleeding, infection, vascular injury, contrast induced renal failure, stroke, reperfusion hemorrhage, or even death. This interventional procedure involves the use of X-rays and because of the nature of the planned procedure, it is possible that we will have prolonged use of X-ray fluoroscopy. Potential radiation risks to you include (but are not limited to) the following: - A slightly elevated risk for cancer  several years later in life. This risk is typically less than 0.5% percent. This risk is low in comparison to the normal incidence of human cancer, which is 33% for women and 50% for men according to the American Cancer Society. - Radiation induced injury can include skin redness, resembling a rash, tissue breakdown / ulcers and hair loss (which can be temporary or permanent).  The  likelihood of either of these occurring depends on the difficulty of the procedure and whether you are sensitive to radiation due to previous procedures, disease, or genetic conditions.  IF your procedure requires a prolonged use of radiation, you will be notified and given written instructions for further action.  It is your responsibility to monitor the irradiated area for the 2 weeks following the procedure and to notify your physician if you are concerned that you have suffered a radiation induced injury.   All of the patient's grandson's questions were answered, he is agreeable to proceed. Consent obtained by patient's grandson, #[id number] [place], via telephone- signed and in chart.   Signed: De Nurse 01/03/2021, 11:36 AM

## 2021-01-03 NOTE — Progress Notes (Addendum)
STROKE TEAM PROGRESS NOTE   INTERVAL HISTORY Patient resting in bed with daughter in law at bedside. Spoke with them at length about her stroke, stenosis and plan for possible intervention.   She will undergo image-guided cerebral arteriogram with possible right ICA revascularization (angioplasty/stent placement) today    Vitals:   01/02/21 2000 01/02/21 2340 01/03/21 0348 01/03/21 0737  BP: 113/76 (!) 143/56 (!) 174/73 (!) 136/49  Pulse: (!) 52 (!) 53 64 (!) 48  Resp: 18  18 16   Temp: (!) 97.3 F (36.3 C) (!) 97.4 F (36.3 C) 98 F (36.7 C) 98.1 F (36.7 C)  TempSrc: Oral Axillary Oral Oral  SpO2: 96% 97% 96% 95%  Weight:      Height:       CBC:  Recent Labs  Lab 12/30/20 1039 12/30/20 1208  WBC 9.7  --   NEUTROABS 5.4  --   HGB 12.5 11.6*  HCT 39.7 34.0*  MCV 91.1  --   PLT 301  --    Basic Metabolic Panel:  Recent Labs  Lab 12/30/20 1039 12/30/20 1208 01/01/21 1300  NA 140 139 138  K 4.3 4.3 4.1  CL 106 105 107  CO2 25  --  24  GLUCOSE 271* 280* 161*  BUN 19 23 14   CREATININE 0.72 0.60 0.62  CALCIUM 10.0  --  9.7   Lipid Panel:  Recent Labs  Lab 12/31/20 0301  CHOL 153  TRIG 235*  HDL 31*  CHOLHDL 4.9  VLDL 47*  LDLCALC 75   HgbA1c:  Recent Labs  Lab 01/01/21 0943  HGBA1C 8.8*   Urine Drug Screen: No results for input(s): LABOPIA, COCAINSCRNUR, LABBENZ, AMPHETMU, THCU, LABBARB in the last 168 hours.  Alcohol Level  Recent Labs  Lab 12/30/20 1039  ETH <10    Pertinent Imaging   CT HEAD WO CONTRAST Result Date: 12/30/2020 IMPRESSION: New acute or subacute infarct at the high RIGHT posterior frontal lobe. No signs of hemorrhage, mass effect or midline shift. Stable findings of atrophy and chronic microvascular ischemic change.   MR ANGIO HEAD WO CONTRAST Result Date: 12/30/2020 IMPRESSION: Patchy acute infarcts in the right frontoparietal lobes including the perirolandic region. Minimal right occipital involvement as well (right  posterior communicating artery is present). Chronic microvascular ischemic changes. No proximal intracranial vessel occlusion. No hemodynamically significant stenosis in the neck.   MR ANGIO NECK WO CONTRAST Result Date: 12/30/2020 IMPRESSION: Patchy acute infarcts in the right frontoparietal lobes including the perirolandic region. Minimal right occipital involvement as well (right posterior communicating artery is present). Chronic microvascular ischemic changes. No proximal intracranial vessel occlusion. No hemodynamically significant stenosis in the neck.   MR BRAIN WO CONTRAST Result Date: 12/30/2020 IMPRESSION: Patchy acute infarcts in the right frontoparietal lobes including the perirolandic region. Minimal right occipital involvement as well (right posterior communicating artery is present). Chronic microvascular ischemic changes. No proximal intracranial vessel occlusion. No hemodynamically significant stenosis in the neck.   CTA Head and Neck W WO IV Contrast Result date: 01/02/21  1. Hypodense foci in the right frontoparietal region corresponding to subacute infarcts seen on prior MRI. No new focal hypodensity to suggest new infarct. 2. Atherosclerotic changes of the right carotid bifurcation resulting in 80% stenosis of the right carotid bulb. 3. Atherosclerotic changes of the left carotid bifurcation without hemodynamically significant stenosis. 4. Atherosclerotic changes of the bilateral intracranial carotid arteries with approximately 60% stenosis of the supraclinoid left ICA and 65% stenosis of the paraclinoid right ICA.  5. Luminal irregularity of the bilateral ACA, MCA and PCA vascular trees suggestive intracranial atherosclerotic disease without hemodynamically significant stenosis. 6. A 1-2 mm outpouching from the left M1/MCA segment, at the origin of a lenticulostriate artery, suggestive of small aneurysm versus infundibulum.  PHYSICAL EXAM GENERAL: Patient is alert awake and  oriented. Not in obvious distress.   HENT: Pupils equally reactive to light. Oral mucosa is moist NECK: is supple CHEST: Clear to auscultation. No crackles or wheezes.  Diminished breath sounds bilaterally. CVS: S1 and S2 heard, no murmur. Regular rate and rhythm. ABDOMEN: Soft, non-tender, bowel sounds are present. EXTREMITIES: No edema. SKIN: warm and dry without rashes.  Motor: Normal bulk and tone. Left upper extremity drift. Decreased fine motor movements to the left arm.  Moderate weakness of left grip and left ankle dorsiflexors.   Dlt Bic Tri FgS Grp HF  KnF KnE PIF DoF  R 5 5 5 5 5 5 5 5 5 5   L 3 3 3 3 3 4 4 4 4 4   Sensory: Intact to light touch on the right. Decreased on the left upper extremity Coordination: Intact FNF Gait: Deferred  ASSESSMENT/PLAN [name] is a 87 y.o. female with history of DM2, HTN, HLD, CAD, hypothyroidism who presents with left hand grip weakness with a LKW of 12/28/20.  Stroke: Right frontoparietal lobes including the perirolandic region acute infarct.  CT head: new acute vs subacute infarct at the high right posterior frontal lobe   MRI: patchy acute infarcts in the right frontoparietal lobes including the perirolandic region. Minimal right occipital involvement  MRA: no significant stenosis in the neck CTA Head and Neck was pertinent for 80 % stenosis right carotid bulb, approximately 60% stenosis of the supraclinoid left ICA and 65% stenosis of the paraclinoid right ICA. Luminal irregularity of the bilateral ACA, MCA and PCA vascular trees suggestive intracranial atherosclerotic disease without hemodynamically significant stenosis. Neuro IR consulted on 6/13 given right carotid stenosis she is to undergo cerebral arteriogram today with possible right ICA revascularization  2D Echo EF 60-65%, moderate concentric left ventricular hypertrophy, Grade I diastolic dysfunction. LDL 75 HgbA1c 8.8  VTE prophylaxis - SCDs Diet: heart healthy  No  antiplatelet prior to admission, now on aspirin 325 mg daily and clopidogrel 75 mg daily. Continue DAPT Aspirin 81mg  and clopidogrel 75mg  for 21 days then aspirin alone Therapy recommendations:  CIR Disposition:  Pending  Hypertension Home meds:  Norvasc 10mg  daily, catepres 0.1mg  bid, cozaar 100mg  daily, lopressor 12.5mg  bid She no longer needs permissive HTN at this time, BP goal per IR post arteriogram today  Long-term BP goal normotensive  Hyperlipidemia Home meds:  Lipitor 20mg  daily LDL 75, goal < 70 Add Lipitor 40mg  daily  Continue statin at discharge  Diabetes type II Controlled Home meds:  glimerpiride 2mg  daily, metformin 1500mg  daily HgbA1c 8.8, goal < 7.0 Primary team to manage DMII inpatient, recommend treatment with SSI while hospitalized   Other Stroke Risk Factors Advanced Age >/= 34  Obesity, Body mass index is 32.03 kg/m., BMI >/= 30 associated with increased stroke risk, recommend weight loss, diet and exercise as appropriate  Coronary artery disease: 07/2014 NSTEMI/PCL: LM nl, LAD min irregs, LCX 85m (4.0x18 Xience DES), LPDA 90 (25mm vessel->med Rx), RCA nl, EF 60%.  Other Active Problems Hypothyroidism: Synthroid daily Anxiety: zoloft  Hospital day # 4  , AGPCNP-BC Stroke Nurse Practitioner    ATTENDING NOTE: I reviewed above note and agree with the assessment  and plan. Pt was seen and examined.   No acute event overnight, neuro stable.  Plan to undergo carotid stenting today with Dr. Sherlon Handing neuro IR.  Marvel Plan, MD PhD Stroke Neurology 01/03/2021 9:55 AM    To contact Stroke Continuity provider, please refer to [email or web address]. After hours, contact General Neurology

## 2021-01-03 NOTE — Progress Notes (Signed)
[facility] PROGRESS NOTE    Progress Note  Jo Larson  YKD:[id number] DOB: 04/18/1932 DOA: 12/30/2020 PCP: Macy Mis, MD     Brief Narrative:   Jo Larson is an 87 y.o. female past medical history of essential hypertension, diabetes mellitus type 2 comes into the ED with left upper extremity weakness MRI of the brain showed acute patchy infarcts in the right frontotemporal lobe, MRA showed no significant stenosis.   Assessment/Plan:   Right parietal CVA: MRI positive.  LDL 75 on a statin.   A1c 8.8 No events on Telemetry monitoring Neurology was consulted recommended aspirin and Plavix for 3 weeks then aspirin alone. CT angio of the head and neck showed approximately 60% stenosis of the supraclinoid left ICA and 65% stenosis of the paraclinoid right ICA.   Neuroradiology has been consulted by neurology, they plan to reach out to the family today to discuss this course of action for the patient with the family. Patient has been placed n.p.o. Therapy recommended CIR, CIR has been consulted and working on Therapist, occupational. Allow permissive hypertension.  Essential hypertension: Currently on clonidine Norvasc metoprolol and losartan blood pressure is improving.  Diabetes mellitus type 2: Noninsulin-dependent on metformin at home, A1c 8.8 Require minimal insulin, will go ahead and restart her back on her metformin as an outpatient. For now we will start her on long-acting insulin.  Continue sliding scale.  Hypothyroidism: Continue Synthroid.  Anxiety: Continue current home regimen.    DVT prophylaxis: lovenox Family Communication:none Status is: Inpatient  Remains inpatient appropriate because:Hemodynamically unstable  Dispo: The patient is from: Home              Anticipated d/c is to: CIR              Patient currently is medically stable to d/c.   Difficult to place patient No        Code Status:     Code Status Orders  (From  admission, onward)           Start     Ordered   12/30/20 2019  Full code  Continuous        12/30/20 2018           Code Status History     Date Active Date Inactive Code Status Order ID Comments User Context   04/27/2015 1752 04/28/2015 1604 Full Code [id number]  Marrian Salvage, MD Inpatient   07/26/2014 1424 07/27/2014 1841 Full Code [id number]  Runell Gess, MD Inpatient   07/24/2014 2002 07/26/2014 1424 Full Code [id number]  Rollene Rotunda, MD Inpatient   06/03/2013 1305 06/05/2013 1807 Full Code [id number]  Minor, Vilinda Blanks, NP ED   04/03/2013 1137 04/06/2013 1400 Full Code [id number]  Christia Reading, MD Inpatient      Advance Directive Documentation    Flowsheet Row Most Recent Value  Type of Advance Directive Healthcare Power of Attorney, Living will  Pre-existing out of facility DNR order (yellow form or pink MOST form) --  "MOST" Form in Place? --         IV Access:   Peripheral IV   Procedures and diagnostic studies:   CT ANGIO HEAD NECK W WO CM  Result Date: 01/02/2021 CLINICAL DATA:  Stroke, follow-up. EXAM: CT ANGIOGRAPHY HEAD AND NECK TECHNIQUE: Multidetector CT imaging of the head and neck was performed using the standard protocol during bolus administration of intravenous contrast. Multiplanar CT image reconstructions and MIPs were obtained to evaluate the vascular  anatomy. Carotid stenosis measurements (when applicable) are obtained utilizing NASCET criteria, using the distal internal carotid diameter as the denominator. CONTRAST:  75mL OMNIPAQUE IOHEXOL 350 MG/ML SOLN COMPARISON:  MRI of the brain, MRA head and neck, December 30, 2020. FINDINGS: CT HEAD FINDINGS Brain: Hypodense foci in the right frontal parietal region consistent with subacute infarcts, better demonstrated on prior MRI. No new hypodense areas to suggest new acute infarct. No hemorrhage, hydrocephalus, extra-axial collection or mass lesion. Mild parenchymal volume loss, more pronounced in the  bilateral frontal lobes. Vascular: Calcified plaques in the bilateral carotid siphons, bilateral intracranial vertebral arteries and basilar artery. Skull: Normal. Negative for fracture or focal lesion. Sinuses: Trace mucosal thickening of the ethmoid cells. Orbits: No acute finding. Review of the MIP images confirms the above findings CTA NECK FINDINGS Aortic arch: Standard branching. Imaged portion shows no evidence of aneurysm or dissection. Atherosclerotic plaques are seen in the aortic arch with ulcerated plaque at the origin of the innominate artery. No significant stenosis of the major arch vessel origins. Right carotid system: Atherosclerotic plaques along the right common carotid artery without hemodynamically significant stenosis. There is prominent atherosclerotic plaque, predominantly soft in the right carotid bulb resulting in approximately 80% stenosis. Increased tortuosity with retropharyngeal course of the cervical segment of the right ICA. Left carotid system: Atherosclerotic changes of the left common carotid artery and left carotid bifurcation without hemodynamically significant stenosis. Increased tortuosity with retropharyngeal course of the cervical right ICA. Vertebral arteries: Codominant. No evidence of dissection, stenosis (50% or greater) or occlusion. Skeleton: Degenerative changes of the cervical spine. Other neck: Negative. Upper chest: Negative. Review of the MIP images confirms the above findings CTA HEAD FINDINGS Anterior circulation: Calcified plaques in the bilateral carotid siphons. There is approximately 60% stenosis of the supraclinoid left ICA and 65% stenosis of the paraclinoid segment of the right ICA. A 1-2 mm outpouching at the origin of the left lenticulostriate artery from the left M1 segment, suggesting small aneurysm versus infundibulum. Mild luminal irregularity in the bilateral ACA and MCA vascular trees suggestive mild atherosclerotic disease without hemodynamically  significant stenosis. Posterior circulation: Mild luminal irregularity of the basilar artery and bilateral posterior cerebral arteries suggestive of atherosclerotic disease without hemodynamically significant stenosis. Venous sinuses: As permitted by contrast timing, patent. Anatomic variants: None significant. Review of the MIP images confirms the above findings IMPRESSION: 1. Hypodense foci in the right frontoparietal region corresponding to subacute infarcts seen on prior MRI. No new focal hypodensity to suggest new infarct. 2. Atherosclerotic changes of the right carotid bifurcation resulting in 80% stenosis of the right carotid bulb. 3. Atherosclerotic changes of the left carotid bifurcation without hemodynamically significant stenosis. 4. Atherosclerotic changes of the bilateral intracranial carotid arteries with approximately 60% stenosis of the supraclinoid left ICA and 65% stenosis of the paraclinoid right ICA. 5. Luminal irregularity of the bilateral ACA, MCA and PCA vascular trees suggestive intracranial atherosclerotic disease without hemodynamically significant stenosis. 6. A 1-2 mm outpouching from the left M1/MCA segment, at the origin of a lenticulostriate artery, suggestive of small aneurysm versus infundibulum. 7. Results communicated via secure text paging to Dr. Roda ShuttersXu at 10:20 a.m. on 01/02/2021. Electronically Signed   By: Baldemar LenisKatyucia  De [place]o Rodrigues M.D.   On: 01/02/2021 10:21     Medical Consultants:   None.   Subjective:    Jo Larson has no complaints today.  Objective:    Vitals:   01/02/21 2000 01/02/21 2340 01/03/21 0348 01/03/21 0737  BP: 113/76 Marland Kitchen(!)  143/56 (!) 174/73 (!) 136/49  Pulse: (!) 52 (!) 53 64 (!) 48  Resp: 18  18 16   Temp: (!) 97.3 F (36.3 C) (!) 97.4 F (36.3 C) 98 F (36.7 C) 98.1 F (36.7 C)  TempSrc: Oral Axillary Oral Oral  SpO2: 96% 97% 96% 95%  Weight:      Height:       SpO2: 95 %   Intake/Output Summary (Last 24 hours) at 01/03/2021  0915 Last data filed at 01/03/2021 0300 Gross per 24 hour  Intake 400 ml  Output --  Net 400 ml    Filed Weights   12/30/20 1059  Weight: 74.4 kg    Exam: General exam: In no acute distress. Respiratory system: Good air movement and clear to auscultation. Cardiovascular system: S1 & S2 heard, RRR. No JVD. Gastrointestinal system: Abdomen is nondistended, soft and nontender.  Extremities: No pedal edema. Skin: No rashes, lesions or ulcers    Data Reviewed:    Labs: Basic Metabolic Panel: Recent Labs  Lab 12/30/20 1039 12/30/20 1208 01/01/21 1300  NA 140 139 138  K 4.3 4.3 4.1  CL 106 105 107  CO2 25  --  24  GLUCOSE 271* 280* 161*  BUN 19 23 14   CREATININE 0.72 0.60 0.62  CALCIUM 10.0  --  9.7    GFR Estimated Creatinine Clearance: 44.7 mL/min (by C-G formula based on SCr of 0.62 mg/dL). Liver Function Tests: Recent Labs  Lab 12/30/20 1039  AST 17  ALT 17  ALKPHOS 56  BILITOT 0.3  PROT 7.4  ALBUMIN 3.8    No results for input(s): LIPASE, AMYLASE in the last 168 hours. No results for input(s): AMMONIA in the last 168 hours. Coagulation profile Recent Labs  Lab 12/30/20 1039  INR 0.9    COVID-19 Labs  No results for input(s): DDIMER, FERRITIN, LDH, CRP in the last 72 hours.  Lab Results  Component Value Date   SARSCOV2NAA NEGATIVE 12/30/2020    CBC: Recent Labs  Lab 12/30/20 1039 12/30/20 1208  WBC 9.7  --   NEUTROABS 5.4  --   HGB 12.5 11.6*  HCT 39.7 34.0*  MCV 91.1  --   PLT 301  --     Cardiac Enzymes: No results for input(s): CKTOTAL, CKMB, CKMBINDEX, TROPONINI in the last 168 hours. BNP (last 3 results) No results for input(s): PROBNP in the last 8760 hours. CBG: Recent Labs  Lab 01/02/21 0634 01/02/21 1111 01/02/21 1609 01/02/21 2117 01/03/21 0751  GLUCAP 181* 301* 214* 179* 162*    D-Dimer: No results for input(s): DDIMER in the last 72 hours. Hgb A1c: Recent Labs    01/01/21 0943  HGBA1C 8.8*   Lipid  Profile: No results for input(s): CHOL, HDL, LDLCALC, TRIG, CHOLHDL, LDLDIRECT in the last 72 hours.  Thyroid function studies: No results for input(s): TSH, T4TOTAL, T3FREE, THYROIDAB in the last 72 hours.  Invalid input(s): FREET3 Anemia work up: No results for input(s): VITAMINB12, FOLATE, FERRITIN, TIBC, IRON, RETICCTPCT in the last 72 hours. Sepsis Labs: Recent Labs  Lab 12/30/20 1039  WBC 9.7    Microbiology Recent Results (from the past 240 hour(s))  Resp Panel by RT-PCR (Flu A&B, Covid) Nasopharyngeal Swab     Status: None   Collection Time: 12/30/20 12:53 PM   Specimen: Nasopharyngeal Swab; Nasopharyngeal(NP) swabs in vial transport medium  Result Value Ref Range Status   SARS Coronavirus 2 by RT PCR NEGATIVE NEGATIVE Final    Comment: (NOTE) SARS-CoV-2  target nucleic acids are NOT DETECTED.  The SARS-CoV-2 RNA is generally detectable in upper respiratory specimens during the acute phase of infection. The lowest concentration of SARS-CoV-2 viral copies this assay can detect is 138 copies/mL. A negative result does not preclude SARS-Cov-2 infection and should not be used as the sole basis for treatment or other patient management decisions. A negative result may occur with  improper specimen collection/handling, submission of specimen other than nasopharyngeal swab, presence of viral mutation(s) within the areas targeted by this assay, and inadequate number of viral copies(<138 copies/mL). A negative result must be combined with clinical observations, patient history, and epidemiological information. The expected result is Negative.  Fact Sheet for Patients:  BloggerCourse.com  Fact Sheet for Healthcare Providers:  SeriousBroker.it  This test is no t yet approved or cleared by the [place] FDA and  has been authorized for detection and/or diagnosis of SARS-CoV-2 by FDA under an Emergency Use Authorization  (EUA). This EUA will remain  in effect (meaning this test can be used) for the duration of the COVID-19 declaration under Section 564(b)(1) of the Act, 21 U.S.C.section 360bbb-3(b)(1), unless the authorization is terminated  or revoked sooner.       Influenza A by PCR NEGATIVE NEGATIVE Final   Influenza B by PCR NEGATIVE NEGATIVE Final    Comment: (NOTE) The Xpert Xpress SARS-CoV-2/FLU/RSV plus assay is intended as an aid in the diagnosis of influenza from Nasopharyngeal swab specimens and should not be used as a sole basis for treatment. Nasal washings and aspirates are unacceptable for Xpert Xpress SARS-CoV-2/FLU/RSV testing.  Fact Sheet for Patients: BloggerCourse.com  Fact Sheet for Healthcare Providers: SeriousBroker.it  This test is not yet approved or cleared by the [place] FDA and has been authorized for detection and/or diagnosis of SARS-CoV-2 by FDA under an Emergency Use Authorization (EUA). This EUA will remain in effect (meaning this test can be used) for the duration of the COVID-19 declaration under Section 564(b)(1) of the Act, 21 U.S.C. section 360bbb-3(b)(1), unless the authorization is terminated or revoked.  Performed at [facility], [address] [place], [place] [address]      Medications:    amLODipine  10 mg Oral Daily   aspirin EC  325 mg Oral Daily   atorvastatin  40 mg Oral Daily   cloNIDine  0.1 mg Oral BID   clopidogrel  75 mg Oral Daily   insulin aspart  0-15 Units Subcutaneous TID WC   insulin aspart  0-5 Units Subcutaneous QHS   insulin detemir  10 Units Subcutaneous BID   levothyroxine  88 mcg Oral Daily   losartan  100 mg Oral Daily   sertraline  100 mg Oral Daily   Continuous Infusions:  sodium chloride 50 mL/hr at 01/02/21 1817      LOS: 4 days   Marinda Elk  [facility]  01/03/2021, 9:15 AM

## 2021-01-03 NOTE — Progress Notes (Signed)
SLP Cancellation Note  Patient Details Name: Jaedan Schuman MRN: [id number] DOB: 05/18/1932   Cancelled evaluation:   Speech-language evaluation is still pending - pt in IR for  arteriogram. Will continue efforts.  Jeaninne Lodico L. Samson Frederic, MA CCC/SLP Acute Rehabilitation Services Office number [phone] Pager [phone]        Bryson, Palen Laurice 01/03/2021, 12:15 PM

## 2021-01-03 NOTE — Procedures (Signed)
INTERVENTIONAL NEURORADIOLOGY BRIEF POSTPROCEDURE NOTE  DIAGNOSTIC CEREBRAL ANGIOGRAM AND RIGHT CAROTID STENTING  Attending: Dr. Pedro Earls  Assistant: None.  Diagnosis: Right ICA stenosis  Access site: Distal right radial artery  Access closure: Inflatable band  Anesthesia: MAC  Medication used: refer to anesthesia documentation.  Complications: None  Estimated blood loss: 50 mL  Specimen: None.  Findings: High grade stenosis of the right ICA at the bulb (greater than 90%). Angioplasty and stenting performed with resolution of stenosis. No evidence of thromboembolic complication. Post procedure flat panel CT showed no hemorrhagic complication.  The patient tolerated the procedure well without incident or complication and is in stable condition.   SBP goal:120-140 mmHg.

## 2021-01-04 ENCOUNTER — Encounter (HOSPITAL_COMMUNITY): Payer: Self-pay | Admitting: Neuroradiology

## 2021-01-04 DIAGNOSIS — I10 Essential (primary) hypertension: Secondary | ICD-10-CM | POA: Diagnosis not present

## 2021-01-04 DIAGNOSIS — I63231 Cerebral infarction due to unspecified occlusion or stenosis of right carotid arteries: Secondary | ICD-10-CM | POA: Diagnosis not present

## 2021-01-04 DIAGNOSIS — E119 Type 2 diabetes mellitus without complications: Secondary | ICD-10-CM | POA: Diagnosis not present

## 2021-01-04 DIAGNOSIS — E7800 Pure hypercholesterolemia, unspecified: Secondary | ICD-10-CM | POA: Diagnosis not present

## 2021-01-04 LAB — GLUCOSE, CAPILLARY
Glucose-Capillary: 330 mg/dL — ABNORMAL HIGH (ref 70–99)
Glucose-Capillary: 305 mg/dL — ABNORMAL HIGH (ref 70–99)
Glucose-Capillary: 257 mg/dL — ABNORMAL HIGH (ref 70–99)
Glucose-Capillary: 132 mg/dL — ABNORMAL HIGH (ref 70–99)

## 2021-01-04 LAB — MRSA NEXT GEN BY PCR, NASAL: MRSA by PCR Next Gen: NOT DETECTED

## 2021-01-04 MED ORDER — CLEVIDIPINE BUTYRATE 0.5 MG/ML IV EMUL
0.0000 mg/h | INTRAVENOUS | Status: DC
  Administered 2021-01-04: 21 mg/h via INTRAVENOUS
  Administered 2021-01-04 (×2): 27 mg/h via INTRAVENOUS
  Filled 2021-01-04 (×2): qty 50
  Filled 2021-01-04: qty 100

## 2021-01-04 MED ORDER — BISACODYL 10 MG RE SUPP: 10.0000 mg | Freq: Every day | RECTAL | Status: DC | PRN

## 2021-01-04 MED ORDER — INSULIN DETEMIR 100 UNIT/ML ~~LOC~~ SOLN
10.0000 [IU] | Freq: Two times a day (BID) | SUBCUTANEOUS | Status: DC
  Administered 2021-01-04 – 2021-01-05 (×3): 10 [IU] via SUBCUTANEOUS
  Filled 2021-01-04 (×4): qty 0.1

## 2021-01-04 MED ORDER — SENNOSIDES-DOCUSATE SODIUM 8.6-50 MG PO TABS
1.0000 | ORAL_TABLET | Freq: Every day | ORAL | Status: DC
  Administered 2021-01-05: 1 via ORAL
  Filled 2021-01-04: qty 1

## 2021-01-04 MED ORDER — POLYETHYLENE GLYCOL 3350 17 G PO PACK
17.0000 g | PACK | Freq: Every day | ORAL | Status: DC
  Administered 2021-01-04: 17 g via ORAL
  Filled 2021-01-04: qty 1

## 2021-01-04 NOTE — Progress Notes (Signed)
Physical Therapy Treatment Patient Details Name: Jo Larson MRN: [id number] DOB: Apr 11, 1932 Today's Date: 01/04/2021    History of Present Illness This 87 y.o. female admitted with 1 day history of Lt UE weakness, and impaired balance.  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 S/p diagnostic cerebral angiogram and R carotid stenting 6/14. PMH includes: angioedema, anxiety, CAD, DM.    PT Comments    Pt easily fatiguing this date with pain in her L UE, back, and head, limiting her mobility progression. Plan was to ambulate increased distances with +2 assistance, but pt was spontaneous in sitting and only able to tolerate standing for up to ~1 min duration at a time, ambulating several feet to transfer to recliner and marching in place. Performed multiple sit <> stand transfers, cuing pt to extend hips and knees and improve balance as she tends to lean posteriorly. Despite pain and fatigue pt was motivated to participate and improve. Will continue to follow acutely. Current recommendations remain appropriate.   Follow Up Recommendations  CIR     Equipment Recommendations  Other (comment) (TBD)    Recommendations for Other Services       Precautions / Restrictions Precautions Precautions: Fall Precaution Comments: SBP < 160 Restrictions Weight Bearing Restrictions: No    Mobility  Bed Mobility Overal bed mobility: Needs Assistance Bed Mobility: Supine to Sit     Supine to sit: Mod assist;HOB elevated     General bed mobility comments: Extra time and modA to ascend trunk, with pt initiating moving legs off L EOB.    Transfers Overall transfer level: Needs assistance Equipment used: 2 person hand held assist;Rolling walker (2 wheeled) Transfers: Sit to/from [employer] Sit to Stand: Min assist;+2 physical assistance Stand pivot transfers: Min assist;+2 physical assistance;+2 safety/equipment       General transfer  comment: Sit <> stand from EOB 1x to RW and 2x from EOB to bil HHA and 1x from recliner to bil HHA, minAx2 to power up and steady. Pt unable to grasp RW efficiently with L UE, thus transitioned to supporting L UE and providing R HHA. During 1 transfer pt displayed a posterior lean with her toes coming off the ground, despite cues to correct. Pt quick to sit spontaneously. MinAx2 to stand step to L to recliner.  Ambulation/Gait Ambulation/Gait assistance: Min assist;+2 physical assistance Gait Distance (Feet): 3 Feet Assistive device: 2 person hand held assist Gait Pattern/deviations: Step-to pattern;Decreased stride length;Decreased step length - left;Decreased stance time - left;Decreased weight shift to left;Wide base of support Gait velocity: decreased Gait velocity interpretation: <1.31 ft/sec, indicative of household ambulator General Gait Details: Pt was able to ambulate with bil HHA.  left LE with decr step length but pt was able to clear foot.  Pt with short steps to L to transfer to recliner, with intent to ambulate further but pt easily fatiguing and spontaneously sitting this date. Additional standing bout cued pt to march in place.   Stairs             Wheelchair Mobility    Modified Rankin (Stroke Patients Only) Modified Rankin (Stroke Patients Only) Pre-Morbid Rankin Score: Moderate disability Modified Rankin: Moderately severe disability     Balance Overall balance assessment: Needs assistance Sitting-balance support: No upper extremity supported Sitting balance-Leahy Scale: Fair     Standing balance support: Bilateral upper extremity supported Standing balance-Leahy Scale: Poor Standing balance comment: Requires at least one UE support and external support  Cognition Arousal/Alertness: Awake/alert Behavior During Therapy: WFL for tasks assessed/performed Overall Cognitive Status: Within Functional Limits for tasks  assessed                                 General Comments: Ipad interpreter used      Exercises      General Comments        Pertinent Vitals/Pain Pain Assessment: Faces Faces Pain Scale: Hurts even more Pain Location: headache, back, L UE Pain Descriptors / Indicators: Headache;Grimacing;Guarding Pain Intervention(s): Limited activity within patient's tolerance;Monitored during session;Repositioned    Home Living       Type of Home: House              Prior Function            PT Goals (current goals can now be found in the care plan section) Acute Rehab PT Goals Patient Stated Goal: to feel better PT Goal Formulation: With patient/family Time For Goal Achievement: 01/14/21 Potential to Achieve Goals: Good Progress towards PT goals: Progressing toward goals    Frequency    Min 4X/week      PT Plan Current plan remains appropriate    Co-evaluation              AM-PAC PT "6 Clicks" Mobility   Outcome Measure  Help needed turning from your back to your side while in a flat bed without using bedrails?: A Little Help needed moving from lying on your back to sitting on the side of a flat bed without using bedrails?: A Lot Help needed moving to and from a bed to a chair (including a wheelchair)?: A Lot Help needed standing up from a chair using your arms (e.g., wheelchair or bedside chair)?: A Lot Help needed to walk in hospital room?: A Lot Help needed climbing 3-5 steps with a railing? : Total 6 Click Score: 12    End of Session Equipment Utilized During Treatment: Gait belt Activity Tolerance: Patient limited by fatigue Patient left: with call bell/phone within reach;in chair;with chair alarm set;with family/visitor present Nurse Communication: Mobility status (purewick to be replaced) PT Visit Diagnosis: Unsteadiness on feet (R26.81);Muscle weakness (generalized) (M62.81);Other abnormalities of gait and mobility (R26.89);Ataxic  gait (R26.0);Difficulty in walking, not elsewhere classified (R26.2);Other symptoms and signs involving the nervous system (R29.898)     Time: 2376-2831 PT Time Calculation (min) (ACUTE ONLY): 27 min  Charges:  $Gait Training: 8-22 mins $Therapeutic Activity: 8-22 mins                     Raymond Gurney, PT, DPT Acute Rehabilitation Services  Pager: [phone] Office: [phone]    Jo Larson 01/04/2021, 3:35 PM

## 2021-01-04 NOTE — Evaluation (Signed)
Speech Language Pathology Evaluation Patient Details Name: Jo Larson MRN: [id number] DOB: 04-Mar-1932 Today's Date: 01/04/2021 Time: 1349-1410 SLP Time Calculation (min) (ACUTE ONLY): 21 min  Problem List:  Patient Active Problem List   Diagnosis Date Noted   CVA (cerebral vascular accident) (HCC) 12/30/2020   Dyslipidemia 10/14/2016   Coronary artery disease involving native coronary artery of native heart without angina pectoris 10/14/2016   Chest pain, atypical 04/27/2015   CAD S/P percutaneous coronary angioplasty 07/27/2014   Abnormal chest x-ray 07/27/2014   History of non-ST elevation myocardial infarction (NSTEMI) 07/24/2014   Hypokalemia 07/24/2014   Angioedema 04/03/2013   Acute respiratory failure (HCC) 04/03/2013   POSITIVE PPD 05/17/2010   URINALYSIS, ABNORMAL 04/24/2010   SYSTOLIC MURMUR 09/13/2009   Hypothyroidism 01/05/2008   Hyperlipidemia 03/03/2007   ANXIETY DISORDER, GENERALIZED 03/03/2007   INSOMNIA, PERSISTENT 03/03/2007   OSTEOARTHRITIS 03/03/2007   DEFICIENCY, VITAMIN D NOS 08/05/2006   Type 2 diabetes mellitus without complication, without long-term current use of insulin (HCC) 06/04/1997   HYPERTENSION, BENIGN ESSENTIAL 07/23/1981   Past Medical History:  Past Medical History:  Diagnosis Date   Abnormal chest x-ray    Angioedema    a. 05/2014 - felt to be 2/2 clindamycin therapy.   Anxiety    CAD (coronary artery disease)    a. 07/2014 NSTEMI/PCI: LM nl, LAD min irregs, LCX 17m (4.0x18 Xience DES), LPDA 90 (64mm vessel->med Rx), RCA nl, EF 60%.   Diabetes mellitus without complication (HCC)    Hyperlipemia    Hypertension    Hypothyroidism    Past Surgical History:  Past Surgical History:  Procedure Laterality Date   INTUBATION NASOTRACHEAL N/A 04/03/2013   Procedure: FIBEROPTIC NASAL-TRACHEAL INTUBATION;  Surgeon: Christia Reading, MD;  Location: [facility] ORS;  Service: ENT;  Laterality: N/A;   LEFT HEART CATHETERIZATION WITH CORONARY ANGIOGRAM N/A  07/26/2014   Procedure: LEFT HEART CATHETERIZATION WITH CORONARY ANGIOGRAM;  Surgeon: Runell Gess, MD;  Location: [facility] CATH LAB;  Service: Cardiovascular;  Laterality: N/A;   RADIOLOGY WITH ANESTHESIA Right 01/03/2021   Procedure: IR WITH ANESTHESIA - CAROTID STENT;  Surgeon: Baldemar Lenis, MD;  Location[facility] OR;  Service: Radiology;  Laterality: Right;   HPI:  This 87 y.o. female admitted with 1 day history of Lt UE weakness, and impaired balance.  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  PMH includes: angioedema, anxiety, CAD, DM.   Assessment / Plan / Recommendation Clinical Impression  Pt's family not present at time of assessment to provide information re: baseline cognition and communication. Utilized ipad interpreter who stated her speech was clear and able to decipher during discourse. She was oriented to person and time and needed choice to recall place and situation for this therapist (told RN "hospital" this morning. Intellectual awareness pt reported arm/leg weakness and need to call for help to walk or use bathroom. No oral-motor weakness; tongue is discolored/brown. She followed 1 step commands to 100% and 2 step with 60% accuracy. Needed assist to state use of call bell for help and identified correct button. ST will continue to follow on acute and she would benefit from CIR therapies.    SLP Assessment  SLP Recommendation/Assessment: Patient needs continued Speech Lanaguage Pathology Services SLP Visit Diagnosis: Cognitive communication deficit (R41.841)    Follow Up Recommendations  Inpatient Rehab    Frequency and Duration min 2x/week  2 weeks      SLP Evaluation Cognition  Overall Cognitive Status:  No family/caregiver present to determine baseline cognitive functioning Arousal/Alertness: Awake/alert Orientation Level: Oriented to person;Oriented to time (needed choice for diagnosis, place) Attention: Sustained Sustained  Attention: Appears intact (during discourse) Memory:  (will assess further- recalled tx earlier but not details) Awareness: Impaired Awareness Impairment: Anticipatory impairment;Emergent impairment Problem Solving: Impaired Problem Solving Impairment: Functional basic Safety/Judgment: Impaired       Comprehension  Auditory Comprehension Overall Auditory Comprehension: Impaired Commands: Impaired One Step Basic Commands: 75-100% accurate Two Step Basic Commands: 50-74% accurate Visual Recognition/Discrimination Discrimination: Not tested Reading Comprehension Reading Status: Not tested    Expression Expression Primary Mode of Expression: Verbal Verbal Expression Overall Verbal Expression: Appears within functional limits for tasks assessed Initiation: No impairment Level of Generative/Spontaneous Verbalization: Conversation Repetition:  (NT) Written Expression Dominant Hand: Left Written Expression: Not tested   Oral / Motor  Oral Motor/Sensory Function Overall Oral Motor/Sensory Function: Within functional limits Motor Speech Overall Motor Speech: Appears within functional limits for tasks assessed (ipad interpreter able to decipher) Respiration: Within functional limits Phonation: Normal Resonance: Within functional limits Articulation: Within functional limitis Intelligibility: Intelligible Motor Planning: Witnin functional limits   GO                    Royce Macadamia 01/04/2021, 2:37 PM Breck Coons Daivon Rayos M.Ed Nurse, children's [phone] Office [phone]

## 2021-01-04 NOTE — Anesthesia Postprocedure Evaluation (Signed)
Anesthesia Post Note  Patient: Jo Larson  Procedure(s) Performed: IR WITH ANESTHESIA - CAROTID STENT (Right)     Patient location during evaluation: PACU Anesthesia Type: MAC Level of consciousness: awake and alert Pain management: pain level controlled Vital Signs Assessment: post-procedure vital signs reviewed and stable Respiratory status: spontaneous breathing, nonlabored ventilation and respiratory function stable Cardiovascular status: blood pressure returned to baseline and stable Postop Assessment: no apparent nausea or vomiting Anesthetic complications: no   No notable events documented.  Last Vitals:  Vitals:   01/04/21 0500 01/04/21 0600  BP: (!) 145/108 (!) 140/105  Pulse: 83 82  Resp: 18 17  Temp:    SpO2: 91% 92%    Last Pain:  Vitals:   01/04/21 0600  TempSrc:   PainSc: 0-No pain                 Pervis Hocking

## 2021-01-04 NOTE — Progress Notes (Signed)
[facility] PROGRESS NOTE    Progress Note  Jo Larson  UXN:[id number] DOB: March 21, 1932 DOA: 12/30/2020 PCP: Macy Mis, MD     Brief Narrative:   Jo Larson is an 87 y.o. female past medical history of essential hypertension, diabetes mellitus type 2 comes into the ED with left upper extremity weakness MRI of the brain showed acute patchy infarcts in the right frontotemporal lobe, MRA showed no significant stenosis.   Assessment/Plan:   Right parietal CVA: MRI positive.  LDL 75 on a statin.   A1c 8.8 No events on Telemetry monitoring Neurology was consulted recommended aspirin and Plavix for 3 weeks then aspirin alone. CT angio of the head and neck showed approximately 60% stenosis of the supraclinoid left ICA and 65% stenosis of the paraclinoid right ICA.   IR was consulted to perform angiogram that showed high-grade stenosis of the right ICA at the bulb greater 90%, angioplasty and stenting was performed with restoration of stenosis. Repeated CT scan showed no hemorrhagic complications. Therapy recommended CIR, CIR has been consulted and working on Therapist, occupational. Allow permissive hypertension. Allow a diet.  Essential hypertension: Currently on clonidine Norvasc, metoprolol and losartan and Cleviprex blood pressure well controlled once we start oral we will start weaning her off the IV infusion.  Diabetes mellitus type 2: Noninsulin-dependent on metformin at home, A1c 8.8 Require minimal insulin, will go ahead and restart her back on her metformin as an outpatient. For now we will start her on long-acting insulin.  Continue sliding scale.  Hypothyroidism: Continue Synthroid.  Anxiety: Continue current home regimen.    DVT prophylaxis: lovenox Family Communication:none Status is: Inpatient  Remains inpatient appropriate because:Hemodynamically unstable  Dispo: The patient is from: Home              Anticipated d/c is to: CIR               Patient currently is medically stable to d/c.   Difficult to place patient No        Code Status:     Code Status Orders  (From admission, onward)           Start     Ordered   12/30/20 2019  Full code  Continuous        12/30/20 2018           Code Status History     Date Active Date Inactive Code Status Order ID Comments User Context   04/27/2015 1752 04/28/2015 1604 Full Code [id number]  Marrian Salvage, MD Inpatient   07/26/2014 1424 07/27/2014 1841 Full Code [id number]  Runell Gess, MD Inpatient   07/24/2014 2002 07/26/2014 1424 Full Code [id number]  Rollene Rotunda, MD Inpatient   06/03/2013 1305 06/05/2013 1807 Full Code [id number]  Minor, Vilinda Blanks, NP ED   04/03/2013 1137 04/06/2013 1400 Full Code [id number]  Christia Reading, MD Inpatient      Advance Directive Documentation    Flowsheet Row Most Recent Value  Type of Advance Directive Healthcare Power of Attorney, Living will  Pre-existing out of facility DNR order (yellow form or pink MOST form) --  "MOST" Form in Place? --         IV Access:   Peripheral IV   Procedures and diagnostic studies:   No results found.   Medical Consultants:   None.   Subjective:    Jo Larson no complaints  Objective:    Vitals:   01/04/21 0700 01/04/21 0800 01/04/21 9381  01/04/21 0900  BP: (!) 146/91 (!) 151/51 (!) 144/70 (!) 142/54  Pulse: 85 82  85  Resp: (!) 25 (!) 21  20  Temp:  99.5 F (37.5 C)    TempSrc:  Oral    SpO2: 92% 92%  92%  Weight:      Height:       SpO2: 92 % O2 Flow Rate (L/min): 3 L/min   Intake/Output Summary (Last 24 hours) at 01/04/2021 1016 Last data filed at 01/04/2021 0600 Gross per 24 hour  Intake 1392.37 ml  Output 1350 ml  Net 42.37 ml    Filed Weights   12/30/20 1059  Weight: 74.4 kg    Exam: General exam: In no acute distress. Respiratory system: Good air movement and clear to auscultation. Cardiovascular system: S1 & S2 heard, RRR. No  JVD. Gastrointestinal system: Abdomen is nondistended, soft and nontender.  Extremities: No pedal edema. Skin: No rashes, lesions or ulcers Data Reviewed:    Labs: Basic Metabolic Panel: Recent Labs  Lab 12/30/20 1039 12/30/20 1208 01/01/21 1300  NA 140 139 138  K 4.3 4.3 4.1  CL 106 105 107  CO2 25  --  24  GLUCOSE 271* 280* 161*  BUN 19 23 14   CREATININE 0.72 0.60 0.62  CALCIUM 10.0  --  9.7    GFR Estimated Creatinine Clearance: 44.7 mL/min (by C-G formula based on SCr of 0.62 mg/dL). Liver Function Tests: Recent Labs  Lab 12/30/20 1039  AST 17  ALT 17  ALKPHOS 56  BILITOT 0.3  PROT 7.4  ALBUMIN 3.8    No results for input(s): LIPASE, AMYLASE in the last 168 hours. No results for input(s): AMMONIA in the last 168 hours. Coagulation profile Recent Labs  Lab 12/30/20 1039  INR 0.9    COVID-19 Labs  No results for input(s): DDIMER, FERRITIN, LDH, CRP in the last 72 hours.  Lab Results  Component Value Date   SARSCOV2NAA NEGATIVE 12/30/2020    CBC: Recent Labs  Lab 12/30/20 1039 12/30/20 1208  WBC 9.7  --   NEUTROABS 5.4  --   HGB 12.5 11.6*  HCT 39.7 34.0*  MCV 91.1  --   PLT 301  --     Cardiac Enzymes: No results for input(s): CKTOTAL, CKMB, CKMBINDEX, TROPONINI in the last 168 hours. BNP (last 3 results) No results for input(s): PROBNP in the last 8760 hours. CBG: Recent Labs  Lab 01/03/21 1148 01/03/21 1605 01/03/21 2018 01/03/21 2304 01/04/21 0739  GLUCAP 170* 105* 144* 203* 330*    D-Dimer: No results for input(s): DDIMER in the last 72 hours. Hgb A1c: No results for input(s): HGBA1C in the last 72 hours.  Lipid Profile: No results for input(s): CHOL, HDL, LDLCALC, TRIG, CHOLHDL, LDLDIRECT in the last 72 hours.  Thyroid function studies: No results for input(s): TSH, T4TOTAL, T3FREE, THYROIDAB in the last 72 hours.  Invalid input(s): FREET3 Anemia work up: No results for input(s): VITAMINB12, FOLATE, FERRITIN,  TIBC, IRON, RETICCTPCT in the last 72 hours. Sepsis Labs: Recent Labs  Lab 12/30/20 1039  WBC 9.7    Microbiology Recent Results (from the past 240 hour(s))  Resp Panel by RT-PCR (Flu A&B, Covid) Nasopharyngeal Swab     Status: None   Collection Time: 12/30/20 12:53 PM   Specimen: Nasopharyngeal Swab; Nasopharyngeal(NP) swabs in vial transport medium  Result Value Ref Range Status   SARS Coronavirus 2 by RT PCR NEGATIVE NEGATIVE Final    Comment: (NOTE) SARS-CoV-2 target nucleic  acids are NOT DETECTED.  The SARS-CoV-2 RNA is generally detectable in upper respiratory specimens during the acute phase of infection. The lowest concentration of SARS-CoV-2 viral copies this assay can detect is 138 copies/mL. A negative result does not preclude SARS-Cov-2 infection and should not be used as the sole basis for treatment or other patient management decisions. A negative result may occur with  improper specimen collection/handling, submission of specimen other than nasopharyngeal swab, presence of viral mutation(s) within the areas targeted by this assay, and inadequate number of viral copies(<138 copies/mL). A negative result must be combined with clinical observations, patient history, and epidemiological information. The expected result is Negative.  Fact Sheet for Patients:  BloggerCourse.comhttps://www.fda.gov/media/152166/download  Fact Sheet for Healthcare Providers:  SeriousBroker.ithttps://www.fda.gov/media/152162/download  This test is no t yet approved or cleared by the [place] FDA and  has been authorized for detection and/or diagnosis of SARS-CoV-2 by FDA under an Emergency Use Authorization (EUA). This EUA will remain  in effect (meaning this test can be used) for the duration of the COVID-19 declaration under Section 564(b)(1) of the Act, 21 U.S.C.section 360bbb-3(b)(1), unless the authorization is terminated  or revoked sooner.       Influenza A by PCR NEGATIVE NEGATIVE Final   Influenza B  by PCR NEGATIVE NEGATIVE Final    Comment: (NOTE) The Xpert Xpress SARS-CoV-2/FLU/RSV plus assay is intended as an aid in the diagnosis of influenza from Nasopharyngeal swab specimens and should not be used as a sole basis for treatment. Nasal washings and aspirates are unacceptable for Xpert Xpress SARS-CoV-2/FLU/RSV testing.  Fact Sheet for Patients: BloggerCourse.comhttps://www.fda.gov/media/152166/download  Fact Sheet for Healthcare Providers: SeriousBroker.ithttps://www.fda.gov/media/152162/download  This test is not yet approved or cleared by the [place] FDA and has been authorized for detection and/or diagnosis of SARS-CoV-2 by FDA under an Emergency Use Authorization (EUA). This EUA will remain in effect (meaning this test can be used) for the duration of the COVID-19 declaration under Section 564(b)(1) of the Act, 21 U.S.C. section 360bbb-3(b)(1), unless the authorization is terminated or revoked.  Performed at [facility], [address] [place], [place] [address]   MRSA Next Gen by PCR, Nasal     Status: None   Collection Time: 01/03/21 11:05 PM   Specimen: Nasal Mucosa; Nasal Swab  Result Value Ref Range Status   MRSA by PCR Next Gen NOT DETECTED NOT DETECTED Final    Comment: (NOTE) The GeneXpert MRSA Assay (FDA approved for NASAL specimens only), is one component of a comprehensive MRSA colonization surveillance program. It is not intended to diagnose MRSA infection nor to guide or monitor treatment for MRSA infections. Test performance is not FDA approved in patients less than 85 years old. Performed at [facility], [address] [place], [place] [address]      Medications:    amLODipine  10 mg Oral Daily   aspirin EC  325 mg Oral Daily   atorvastatin  40 mg Oral Daily   Chlorhexidine Gluconate Cloth  6 each Topical Daily   cloNIDine  0.1 mg Oral BID   clopidogrel  75 mg Oral Daily   insulin aspart  0-15 Units Subcutaneous TID WC   insulin aspart   0-5 Units Subcutaneous QHS   insulin detemir  10 Units Subcutaneous BID   levothyroxine  88 mcg Oral Daily   losartan  100 mg Oral Daily   sertraline  100 mg Oral Daily   Continuous Infusions:  sodium chloride 50 mL/hr at  01/04/21 0600   clevidipine 27 mg/hr (01/04/21 0854)      LOS: 5 days   Marinda Elk  [facility]  01/04/2021, 10:16 AM

## 2021-01-04 NOTE — Progress Notes (Addendum)
STROKE TEAM PROGRESS NOTE   INTERVAL HISTORY Patient resting in bed with daughter in law at bedside. She is doing well post right carotid arteriogram with stent placement. Neurological exam is stable.   Nursing to dc foley + A line.   Patient and daughter in law updated regarding care and discharge planning.    Vitals:   01/04/21 0900 01/04/21 1000 01/04/21 1200 01/04/21 1300  BP: (!) 142/54 (!) 157/52 (!) 161/54 (!) 154/53  Pulse: 85 93 96 87  Resp: 20 19 18  (!) 25  Temp:   100.1 F (37.8 C)   TempSrc:   Oral   SpO2: 92% 92% 91% 92%  Weight:      Height:       CBC:  Recent Labs  Lab 12/30/20 1039 12/30/20 1208  WBC 9.7  --   NEUTROABS 5.4  --   HGB 12.5 11.6*  HCT 39.7 34.0*  MCV 91.1  --   PLT 301  --    Basic Metabolic Panel:  Recent Labs  Lab 12/30/20 1039 12/30/20 1208 01/01/21 1300  NA 140 139 138  K 4.3 4.3 4.1  CL 106 105 107  CO2 25  --  24  GLUCOSE 271* 280* 161*  BUN 19 23 14   CREATININE 0.72 0.60 0.62  CALCIUM 10.0  --  9.7   Lipid Panel:  Recent Labs  Lab 12/31/20 0301  CHOL 153  TRIG 235*  HDL 31*  CHOLHDL 4.9  VLDL 47*  LDLCALC 75   HgbA1c:  Recent Labs  Lab 01/01/21 0943  HGBA1C 8.8*   Urine Drug Screen: No results for input(s): LABOPIA, COCAINSCRNUR, LABBENZ, AMPHETMU, THCU, LABBARB in the last 168 hours.  Alcohol Level  Recent Labs  Lab 12/30/20 1039  ETH <10    Pertinent Imaging   CT HEAD WO CONTRAST Result Date: 12/30/2020 IMPRESSION: New acute or subacute infarct at the high RIGHT posterior frontal lobe. No signs of hemorrhage, mass effect or midline shift. Stable findings of atrophy and chronic microvascular ischemic change.   MR ANGIO HEAD WO CONTRAST Result Date: 12/30/2020 IMPRESSION: Patchy acute infarcts in the right frontoparietal lobes including the perirolandic region. Minimal right occipital involvement as well (right posterior communicating artery is present). Chronic microvascular ischemic changes. No  proximal intracranial vessel occlusion. No hemodynamically significant stenosis in the neck.   MR ANGIO NECK WO CONTRAST Result Date: 12/30/2020 IMPRESSION: Patchy acute infarcts in the right frontoparietal lobes including the perirolandic region. Minimal right occipital involvement as well (right posterior communicating artery is present). Chronic microvascular ischemic changes. No proximal intracranial vessel occlusion. No hemodynamically significant stenosis in the neck.   MR BRAIN WO CONTRAST Result Date: 12/30/2020 IMPRESSION: Patchy acute infarcts in the right frontoparietal lobes including the perirolandic region. Minimal right occipital involvement as well (right posterior communicating artery is present). Chronic microvascular ischemic changes. No proximal intracranial vessel occlusion. No hemodynamically significant stenosis in the neck.   CTA Head and Neck W WO IV Contrast Result date: 01/02/21  1. Hypodense foci in the right frontoparietal region corresponding to subacute infarcts seen on prior MRI. No new focal hypodensity to suggest new infarct. 2. Atherosclerotic changes of the right carotid bifurcation resulting in 80% stenosis of the right carotid bulb. 3. Atherosclerotic changes of the left carotid bifurcation without hemodynamically significant stenosis. 4. Atherosclerotic changes of the bilateral intracranial carotid arteries with approximately 60% stenosis of the supraclinoid left ICA and 65% stenosis of the paraclinoid right ICA. 5. Luminal irregularity of the  bilateral ACA, MCA and PCA vascular trees suggestive intracranial atherosclerotic disease without hemodynamically significant stenosis. 6. A 1-2 mm outpouching from the left M1/MCA segment, at the origin of a lenticulostriate artery, suggestive of small aneurysm versus infundibulum.  PHYSICAL EXAM GENERAL: Patient is alert awake and oriented. Not in obvious distress.   HENT: Pupils equally reactive to light. Oral mucosa  is moist NECK: is supple CHEST: Clear to auscultation. No crackles or wheezes.  Diminished breath sounds bilaterally. CVS: S1 and S2 heard, no murmur. Regular rate and rhythm. ABDOMEN: Soft, non-tender, bowel sounds are present. EXTREMITIES: No edema. SKIN: warm and dry without rashes.  Motor: Normal bulk and tone. Left upper extremity drift. Decreased fine motor movements to the left arm.  Moderate weakness of left grip and left ankle dorsiflexors.   Dlt Bic Tri FgS Grp HF  KnF KnE PIF DoF  R 5 5 5 5 5 5 5 5 5 5   L 3 3 3 3 3 4 4 4 4 4   Sensory: Intact to light touch on the right. Decreased on the left upper extremity Coordination: Intact FNF Gait: Deferred  ASSESSMENT/PLAN Jo Larson is a 87 y.o. female with history of DM2, HTN, HLD, CAD, hypothyroidism who presents with left hand grip weakness with a LKW of 12/28/20.  Stroke: Right frontoparietal lobes including the perirolandic region acute infarct.  CT head: new acute vs subacute infarct at the high right posterior frontal lobe   MRI: patchy acute infarcts in the right frontoparietal lobes including the perirolandic region. Minimal right occipital involvement  MRA: no significant stenosis in the neck CTA Head and Neck was pertinent for 80 % stenosis right carotid bulb, approximately 60% stenosis of the supraclinoid left ICA and 65% stenosis of the paraclinoid right ICA. Luminal irregularity of the bilateral ACA, MCA and PCA vascular trees suggestive intracranial atherosclerotic disease without hemodynamically significant stenosis. Neuro IR consulted on 6/13 given right carotid stenosis; she underwent arteriogram with right carotid stenting on 6/14  2D Echo EF 60-65%, moderate concentric left ventricular hypertrophy, Grade I diastolic dysfunction. LDL 75 HgbA1c 8.8  VTE prophylaxis - SCDs Diet: heart healthy  No antiplatelet prior to admission, now on aspirin 325 mg daily and clopidogrel 75 mg daily. Per IR will continue DAPT  for 3 months until 3 month follow up CUS  Therapy recommendations:  CIR Disposition:  Pending  Hypertension Home meds:  Norvasc 10mg  daily, catepres 0.1mg  bid, cozaar 100mg  daily, lopressor 12.5mg  bid SBP goal post right carotid stent is 120 to 140 after 24 hours will liberalize further to 160  Long-term BP goal normotensive  Hyperlipidemia Home meds:  Lipitor 20mg  daily LDL 75, goal < 70 Add Lipitor 40mg  daily  Continue statin at discharge  Diabetes type II Controlled Home meds:  glimerpiride 2mg  daily, metformin 1500mg  daily HgbA1c 8.8, goal < 7.0 Primary team to manage DMII inpatient, recommend treatment with SSI while hospitalized   Other Stroke Risk Factors Advanced Age >/= 21  Obesity, Body mass index is 32.03 kg/m., BMI >/= 30 associated with increased stroke risk, recommend weight loss, diet and exercise as appropriate  Coronary artery disease: 07/2014 NSTEMI/PCL: LM nl, LAD min irregs, LCX 72m (4.0x18 Xience DES), LPDA 90 (70mm vessel->med Rx), RCA nl, EF 60%.  Other Active Problems Hypothyroidism: Synthroid daily Anxiety: zoloft  Hospital day # 5  , AGPCNP-BC Stroke Nurse Practitioner    ATTENDING NOTE: I reviewed above note and agree with the assessment and plan. Pt was  seen and examined.   Patient seen this morning with interpreter via iPad.  Patient daughter-in-law at bedside.  Patient doing well, awake alert, orientated to age place but not to time.  Still has left upper and lower extremity weakness, however no aphasia or dysarthria per daughter-in-law.  Had right ICA stenting yesterday with Dr. Sherlon Handing, doing well.  Still on Cleviprex for BP goal 120-140 for 24 hours postprocedure.  Will relax goal to 120-160 after 24 hours.  Resume p.o. BP meds to taper off Cleviprex as able.  Continue aspirin Plavix and Lipitor.  PT/OT recommend CIR.  Marvel Plan, MD PhD Stroke Neurology 01/04/2021 2:40 PM  This patient is critically ill due to right  MCA stroke due to right ICA high-grade stenosis, hypertension needing IV BP meds and at significant risk of neurological worsening, death form recurrent stroke, carotid stent occlusion, hemorrhagic conversion. This patient's care requires constant monitoring of vital signs, hemodynamics, respiratory and cardiac monitoring, review of multiple databases, neurological assessment, discussion with family, other specialists and medical decision making of high complexity. I spent 40 minutes of neurocritical care time in the care of this patient.  Discussed with Dr. Robb Matar and Dr. Sherlon Handing.     To contact Stroke Continuity provider, please refer to [email or web address]. After hours, contact General Neurology

## 2021-01-04 NOTE — Progress Notes (Signed)
Inpatient Rehab Admissions Coordinator:   I do not have a bed on CIR or insurance authorization for this pt. Today. I will continue to follow for potential admission pending insurance authorization, bed availability, and medical readiness.  Megan Salon, MS, CCC-SLP Rehab Admissions Coordinator  [phone] (celll) [phone] (office)

## 2021-01-04 NOTE — Progress Notes (Signed)
Referring Physician(s): Marvel Plan  Supervising Physician: Baldemar Lenis  Patient Status:  [facility] - In-pt  Chief Complaint: Follow up right ICA stenosis s/p stent placement 01/03/21 in NIR  Subjective:  Patient laying in bed, just vomited on the floor after eating some breakfast. Per RN tolerated applesauce and water earlier this AM without a problem. Left wrist site bleeding after arterial line removed earlier, RN holding pressure. Daughter at bedside. Dr Tommi Rumps Melchor Amour present for evaluation/discussion. [place] interpreter Dorma Russell [phone]) used for exam today.   Patient reports abdominal pain and nausea, she also c/o pain to her left wrist. Denies pain at right radial artery puncture site. Overall she says she feels about the same as yesterday. Per daughter she fractured her left wrist/arm in September of last year but had recovered well without any weakness, the weakness in her left arm started only after the stroke. No questions per patient or daughter.  Allergies: Clindamycin/lincomycin, Peanut butter flavor, and Peanuts [peanut oil]  Medications: Prior to Admission medications   Medication Sig Start Date End Date Taking? Authorizing Provider  acetaminophen (TYLENOL) 325 MG tablet Take 325 mg by mouth in the morning and at bedtime.   Yes [provider]  amLODipine (NORVASC) 10 MG tablet Take 1 tablet by mouth daily. 09/27/20  Yes [provider]  atorvastatin (LIPITOR) 20 MG tablet Take 1 tablet by mouth daily. 11/07/20  Yes [provider]  Cholecalciferol 25 MCG (1000 UT) capsule Take 1,000 Units by mouth daily.   Yes [provider]  cloNIDine (CATAPRES) 0.1 MG tablet Take 1 tablet by mouth 2 (two) times daily. 07/20/16  Yes [provider]  esomeprazole (NEXIUM) 40 MG capsule Take 1 capsule by mouth daily with breakfast. 08/20/16  Yes [provider]  glimepiride (AMARYL) 2 MG tablet Take 2 mg by mouth  daily. 11/03/20  Yes [provider]  levothyroxine (SYNTHROID) 88 MCG tablet Take 88 mcg by mouth daily. 11/22/20  Yes [provider]  losartan (COZAAR) 100 MG tablet Take 1 tablet (100 mg total) by mouth daily. 08/21/19  Yes Rollene Rotunda, MD  metFORMIN (GLUCOPHAGE-XR) 500 MG 24 hr tablet Take 1,500 mg by mouth daily with breakfast. 12/01/20  Yes [provider]  metoprolol tartrate (LOPRESSOR) 25 MG tablet Take 12.5 mg by mouth 2 (two) times daily. 08/20/16  Yes [provider]  sertraline (ZOLOFT) 100 MG tablet Take 1 tablet by mouth daily. 11/03/20  Yes [provider]  amLODipine (NORVASC) 5 MG tablet Take 1 tablet (5 mg total) by mouth daily. 10/12/16 10/12/17  Rollene Rotunda, MD  azithromycin (ZITHROMAX) 250 MG tablet Take 1 tablet (250 mg total) by mouth daily. Take first 2 tablets together, then 1 every day until finished. Patient not taking: Reported on 12/30/2020 09/03/16   Elson Areas, PA-C  diphenhydrAMINE (BENADRYL) 25 MG tablet Take 1 tablet (25 mg total) by mouth every 6 (six) hours as needed for allergies. Patient not taking: Reported on 12/30/2020 08/09/18   Arby Barrette, MD  famotidine (PEPCID) 20 MG tablet Take 1 tablet (20 mg total) by mouth 2 (two) times daily. Patient not taking: Reported on 12/30/2020 08/09/18   Arby Barrette, MD  LORazepam (ATIVAN) 1 MG tablet Take 1 tablet (1 mg total) by mouth 3 (three) times daily as needed for anxiety. Patient not taking: Reported on 12/30/2020 09/03/16   Elson Areas, PA-C  predniSONE (DELTASONE) 20 MG tablet 2 tabs po daily x 3 days  Patient not taking: Reported on 12/30/2020 08/09/18   Arby Barrette, MD     Vital Signs: BP (!) 142/54   Pulse 85   Temp 99.5 F (37.5 C) (Oral)   Resp 20   Ht 5' (1.524 m)   Wt 164 lb (74.4 kg)   SpO2 92%   BMI 32.03 kg/m   Physical Exam Vitals and nursing note reviewed.  Constitutional:      General: She is not in acute distress. HENT:      Head: Normocephalic.  Cardiovascular:     Rate and Rhythm: Normal rate.     Comments: (+) site of previous left radial artery line actively bleeding, RN holding pressure. (+) right radial artery puncture site without actively bleeding, non tender.  Pulmonary:     Effort: Pulmonary effort is normal.  Skin:    General: Skin is warm and dry.  Neurological:     Mental Status: She is alert.  Alert, awake, and oriented x3  Speech and comprehension in tact PERRL bilaterally EOMs without obvious nystagmus or subjective diplopia. Visual fields grossly in tact No obvious facial asymmetry. Motor power full right upper and lower extremities, full left lower extremity, diminished left upper extremity (near baseline) Fine motor and coordination grossly intact Gait - not assessed Distal pulses palpable bilaterally   Imaging: CT ANGIO HEAD NECK W WO CM  Result Date: 01/02/2021 CLINICAL DATA:  Stroke, follow-up. EXAM: CT ANGIOGRAPHY HEAD AND NECK TECHNIQUE: Multidetector CT imaging of the head and neck was performed using the standard protocol during bolus administration of intravenous contrast. Multiplanar CT image reconstructions and MIPs were obtained to evaluate the vascular anatomy. Carotid stenosis measurements (when applicable) are obtained utilizing NASCET criteria, using the distal internal carotid diameter as the denominator. CONTRAST:  75mL OMNIPAQUE IOHEXOL 350 MG/ML SOLN COMPARISON:  MRI of the brain, MRA head and neck, December 30, 2020. FINDINGS: CT HEAD FINDINGS Brain: Hypodense foci in the right frontal parietal region consistent with subacute infarcts, better demonstrated on prior MRI. No new hypodense areas to suggest new acute infarct. No hemorrhage, hydrocephalus, extra-axial collection or mass lesion. Mild parenchymal volume loss, more pronounced in the bilateral frontal lobes. Vascular: Calcified plaques in the bilateral carotid siphons, bilateral intracranial vertebral arteries and  basilar artery. Skull: Normal. Negative for fracture or focal lesion. Sinuses: Trace mucosal thickening of the ethmoid cells. Orbits: No acute finding. Review of the MIP images confirms the above findings CTA NECK FINDINGS Aortic arch: Standard branching. Imaged portion shows no evidence of aneurysm or dissection. Atherosclerotic plaques are seen in the aortic arch with ulcerated plaque at the origin of the innominate artery. No significant stenosis of the major arch vessel origins. Right carotid system: Atherosclerotic plaques along the right common carotid artery without hemodynamically significant stenosis. There is prominent atherosclerotic plaque, predominantly soft in the right carotid bulb resulting in approximately 80% stenosis. Increased tortuosity with retropharyngeal course of the cervical segment of the right ICA. Left carotid system: Atherosclerotic changes of the left common carotid artery and left carotid bifurcation without hemodynamically significant stenosis. Increased tortuosity with retropharyngeal course of the cervical right ICA. Vertebral arteries: Codominant. No evidence of dissection, stenosis (50% or greater) or occlusion. Skeleton: Degenerative changes of the cervical spine. Other neck: Negative. Upper chest: Negative. Review of the MIP images confirms the above findings CTA HEAD FINDINGS Anterior circulation: Calcified plaques in the bilateral carotid siphons. There is approximately 60% stenosis of the supraclinoid left ICA and 65% stenosis of the paraclinoid segment  of the right ICA. A 1-2 mm outpouching at the origin of the left lenticulostriate artery from the left M1 segment, suggesting small aneurysm versus infundibulum. Mild luminal irregularity in the bilateral ACA and MCA vascular trees suggestive mild atherosclerotic disease without hemodynamically significant stenosis. Posterior circulation: Mild luminal irregularity of the basilar artery and bilateral posterior cerebral  arteries suggestive of atherosclerotic disease without hemodynamically significant stenosis. Venous sinuses: As permitted by contrast timing, patent. Anatomic variants: None significant. Review of the MIP images confirms the above findings IMPRESSION: 1. Hypodense foci in the right frontoparietal region corresponding to subacute infarcts seen on prior MRI. No new focal hypodensity to suggest new infarct. 2. Atherosclerotic changes of the right carotid bifurcation resulting in 80% stenosis of the right carotid bulb. 3. Atherosclerotic changes of the left carotid bifurcation without hemodynamically significant stenosis. 4. Atherosclerotic changes of the bilateral intracranial carotid arteries with approximately 60% stenosis of the supraclinoid left ICA and 65% stenosis of the paraclinoid right ICA. 5. Luminal irregularity of the bilateral ACA, MCA and PCA vascular trees suggestive intracranial atherosclerotic disease without hemodynamically significant stenosis. 6. A 1-2 mm outpouching from the left M1/MCA segment, at the origin of a lenticulostriate artery, suggestive of small aneurysm versus infundibulum. 7. Results communicated via secure text paging to Dr. Roda Shutters at 10:20 a.m. on 01/02/2021. Electronically Signed   By: Baldemar Lenis M.D.   On: 01/02/2021 10:21   ECHOCARDIOGRAM COMPLETE  Result Date: 12/31/2020    ECHOCARDIOGRAM REPORT   Patient Name:   [facility] Date of Exam: 12/31/2020 Medical Rec #:  [id number]      Height:       60.0 in Accession #:    [id number]     Weight:       164.0 lb Date of Birth:  28-Jun-1932       BSA:          1.716 m Patient Age:    87 years       BP:           171/74 mmHg Patient Gender: F              HR:           71 bpm. Exam Location:  Inpatient Procedure: 2D Echo, Cardiac Doppler and Color Doppler Indications:    Stroke I63.9  History:        Patient has prior history of Echocardiogram examinations, most                 recent 08/06/2014.  Sonographer:    Roosvelt Maser RDCS Referring Phys: [id number] Teddy Spike IMPRESSIONS  1. Left ventricular ejection fraction, by estimation, is 60 to 65%. The left ventricle has normal function. The left ventricle has no regional wall motion abnormalities. There is moderate concentric left ventricular hypertrophy. Left ventricular diastolic parameters are consistent with Grade I diastolic dysfunction (impaired relaxation).  2. Right ventricular systolic function is normal. The right ventricular size is normal. Mildly increased right ventricular wall thickness. Tricuspid regurgitation signal is inadequate for assessing PA pressure.  3. Left atrial size was mildly dilated.  4. The mitral valve is normal in structure. Trivial mitral valve regurgitation.  5. The aortic valve is tricuspid. There is mild calcification of the aortic valve. There is mild thickening of the aortic valve. Aortic valve regurgitation is trivial. Mild to moderate aortic valve sclerosis/calcification is present, without any evidence of aortic stenosis. Comparison(s): Compared to prior echo report in 2016,  there is no significant change. Moderate LVH is noted on current study. Conclusion(s)/Recommendation(s): No intracardiac source of embolism detected on this transthoracic study. A transesophageal echocardiogram is recommended to exclude cardiac source of embolism if clinically indicated. FINDINGS  Left Ventricle: Left ventricular ejection fraction, by estimation, is 60 to 65%. The left ventricle has normal function. The left ventricle has no regional wall motion abnormalities. The left ventricular internal cavity size was normal in size. There is  moderate concentric left ventricular hypertrophy. Left ventricular diastolic parameters are consistent with Grade I diastolic dysfunction (impaired relaxation). Right Ventricle: The right ventricular size is normal. Mildly increased right ventricular wall thickness. Right ventricular systolic function is normal. Tricuspid  regurgitation signal is inadequate for assessing PA pressure. Left Atrium: Left atrial size was mildly dilated. Right Atrium: Right atrial size was normal in size. Pericardium: There is no evidence of pericardial effusion. Mitral Valve: The mitral valve is normal in structure. There is mild thickening of the mitral valve leaflet(s). Trivial mitral valve regurgitation. Tricuspid Valve: The tricuspid valve is normal in structure. Tricuspid valve regurgitation is trivial. Aortic Valve: The aortic valve is tricuspid. There is mild calcification of the aortic valve. There is mild thickening of the aortic valve. Aortic valve regurgitation is trivial. Mild to moderate aortic valve sclerosis/calcification is present, without any evidence of aortic stenosis. Aortic valve mean gradient measures 5.0 mmHg. Aortic valve peak gradient measures 12.5 mmHg. Aortic valve area, by VTI measures 2.22 cm. Pulmonic Valve: The pulmonic valve was normal in structure. Pulmonic valve regurgitation is trivial. Aorta: The aortic root is normal in size and structure. IAS/Shunts: No atrial level shunt detected by color flow Doppler.  LEFT VENTRICLE PLAX 2D LVIDd:         3.40 cm  Diastology LVIDs:         2.40 cm  LV e' medial:    5.77 cm/s LV PW:         1.30 cm  LV E/e' medial:  9.5 LV IVS:        1.20 cm  LV e' lateral:   8.05 cm/s LVOT diam:     1.90 cm  LV E/e' lateral: 6.8 LV SV:         69 LV SV Index:   40 LVOT Area:     2.84 cm  RIGHT VENTRICLE RV Basal diam:  3.90 cm LEFT ATRIUM             Index       RIGHT ATRIUM           Index LA diam:        3.50 cm 2.04 cm/m  RA Area:     15.20 cm LA Vol (A2C):   65.4 ml 38.12 ml/m RA Volume:   35.20 ml  20.52 ml/m LA Vol (A4C):   50.1 ml 29.20 ml/m LA Biplane Vol: 59.6 ml 34.74 ml/m  AORTIC VALVE AV Area (Vmax):    1.89 cm AV Area (Vmean):   2.33 cm AV Area (VTI):     2.22 cm AV Vmax:           177.00 cm/s AV Vmean:          104.000 cm/s AV VTI:            0.309 m AV Peak Grad:       12.5 mmHg AV Mean Grad:      5.0 mmHg LVOT Vmax:         118.00 cm/s  LVOT Vmean:        85.600 cm/s LVOT VTI:          0.242 m LVOT/AV VTI ratio: 0.78  AORTA Ao Root diam: 3.20 cm MITRAL VALVE MV Area (PHT): 2.75 cm    SHUNTS MV Decel Time: 276 msec    Systemic VTI:  0.24 m MV E velocity: 54.80 cm/s  Systemic Diam: 1.90 cm MV A velocity: 80.50 cm/s MV E/A ratio:  0.68 Laurance Flatten MD Electronically signed by Laurance Flatten MD Signature Date/Time: 12/31/2020/7:00:28 PM    Final     Labs:  CBC: Recent Labs    12/30/20 1039 12/30/20 1208  WBC 9.7  --   HGB 12.5 11.6*  HCT 39.7 34.0*  PLT 301  --     COAGS: Recent Labs    12/30/20 1039  INR 0.9  APTT 31    BMP: Recent Labs    12/30/20 1039 12/30/20 1208 01/01/21 1300  NA 140 139 138  K 4.3 4.3 4.1  CL 106 105 107  CO2 25  --  24  GLUCOSE 271* 280* 161*  BUN 19 23 14   CALCIUM 10.0  --  9.7  CREATININE 0.72 0.60 0.62  GFRNONAA >60  --  >60    LIVER FUNCTION TESTS: Recent Labs    12/30/20 1039  BILITOT 0.3  AST 17  ALT 17  ALKPHOS 56  PROT 7.4  ALBUMIN 3.8    Assessment and Plan:  87 y/o F admitted for high grade right ICA stenosis and right frontoparietal infarcts s/p right ICA angioplasty and stenting 01/03/21 by Dr 01/05/21 Tommi Rumps seen today for follow up.   Patient with episode of emesis x 1 just prior to arrival to room, per RN had tolerated some PO intake earlier in the day. She feels about the same overall, does c/o some abdominal pain. Active bleeding at previous left radial artery line site, RN holding pressure and hemostasis achieved during exam. Left upper extremity remains weak similar to pre-procedure exam, patient also with wrist pain after art line removal which limits neuro exam. No acute changes noted on exam.  Patient planned to transition to inpatient rehab - she will need to continue DAPT at least until 3 month follow up carotid Melchor Amour.  Further plans per neurology/TRH - NIR remains  available as needed, please call with questions or concerns.  Electronically Signed: [place], PA-C 01/04/2021, 11:15 AM   I spent a total of 25 Minutes at the the patient's bedside AND on the patient's hospital floor or unit, greater than 50% of which was counseling/coordinating care for follow up right ICA angioplasty/stenting.

## 2021-01-04 NOTE — PMR Pre-admission (Signed)
PMR Admission Coordinator Pre-Admission Assessment  Patient: Jo Larson is an 87 y.o., female MRN: [id number] DOB: [birth year] Height: 5' (152.4 cm) Weight: 74.4 kg  Insurance Information HMO: yes     PPO:      PCP:      IPA:      80/20:      OTHER:  PRIMARY: [employer]      Policy#: [id number]      Subscriber: Pt CM Name:  n/a     Phone#:  [phone]    Fax#: [phone] Pre-Cert#: [id number]      Employer: I received a call from Ebony Hail from [place] on 01/04/21 with authorization for admission for 7 days, 01/04/21-01/11/21, with next update due 01/10/21 Benefits:  Phone #: opened via [email or web address]     Name:  Irene Shipper Date: 07/23/2020 - still active Deductible: $0 OOP Max: $7,550 ($0 met) CIR: $1480 admission copay SNF: $0.00 Copayment per day for days 1-20; $194.50 Copayment per day for days 21-100; Maximum of 100 days/benefit period Outpatient: 100% coverage Home Health:  100% coverage, 0% co-insurance; limited by medical necessity DME: 80% coverage; 20% co-insurance Providers: in network  SECONDARY: Medicaid      Policy#:[id number]    Phone#:   Development worker, community:       Phone#:   The Therapist, art Information Summary" for patients in Inpatient Rehabilitation Facilities with attached "Privacy Act [place] Records" was provided and verbally reviewed with: Family  Emergency Contact Information Contact Information     Name Relation Home Work Mobile   Bremer [place] Relative   [place] [phone]         Current Medical History  Patient Admitting Diagnosis: R CVA  History of Present Illness: Pt. Is an  87 y.o. female with  PMH includes: angioedema, anxiety, CAD, DM. admitted with 1 day history of Lt UE weakness, and impaired balance on 12/30/12  .  MRI of brain showed patchy acute infarcts of the Rt frontoparietal lobes.Pt. found t have R ICA stenosis and underwent cerebral  angiogram and right carotid stenting with IR on 01/03/21. CIR was consulted to assist in return to PLOF.  Complete NIHSS TOTAL: 3  Patient's medical [facility] from  [facility]  has been reviewed by the rehabilitation admission coordinator and physician.  Past Medical History  Past Medical History:  Diagnosis Date   Abnormal chest x-ray    Angioedema    a. 05/2014 - felt to be 2/2 clindamycin therapy.   Anxiety    CAD (coronary artery disease)    a. 07/2014 NSTEMI/PCI: LM nl, LAD min irregs, LCX 62m(4.0x18 Xience DES), LPDA 90 (21mvessel->med Rx), RCA nl, EF 60%.   Diabetes mellitus without complication ([place]   Hyperlipemia    Hypertension    Hypothyroidism     Family History   family history includes Diabetes in her father; Hypertension in her father.  Prior Rehab/Hospitalizations Has the patient had prior rehab or hospitalizations prior to admission? No  Has the patient had major surgery during 100 days prior to admission? Yes   Current Medications  Current Facility-Administered Medications:    0.9 %  sodium chloride infusion, , Intravenous, Continuous, XuRosalin HawkingMD, Last Rate: 50 mL/hr at 01/05/21 0700, Infusion Verify at 01/05/21 0700   acetaminophen (TYLENOL) tablet 650 mg, 650 mg, Oral, Q4H PRN, 650 mg at 01/05/21 0935 **OR** acetaminophen (TYLENOL) 160 MG/5ML solution 650 mg, 650 mg, Per Tube, Q4H  PRN **OR** acetaminophen (TYLENOL) suppository 650 mg, 650 mg, Rectal, Q4H PRN, Marylyn Ishihara, Tyrone A, DO   amLODipine (NORVASC) tablet 10 mg, 10 mg, Oral, Daily, Pokhrel, Laxman, MD, 10 mg at 01/05/21 7824   aspirin EC tablet 325 mg, 325 mg, Oral, Daily, Rosalin Hawking, MD, 325 mg at 01/05/21 0925   atorvastatin (LIPITOR) tablet 40 mg, 40 mg, Oral, Daily, Kyle, Tyrone A, DO, 40 mg at 01/05/21 2353   bisacodyl (DULCOLAX) suppository 10 mg, 10 mg, Rectal, Daily PRN, Felipa Evener, Courtney S, NP   Chlorhexidine Gluconate Cloth 2 % PADS 6 each, 6 each, Topical, Daily, de Sindy Messing, [place], MD, 6 each at 01/04/21 0904   cloNIDine (CATAPRES) tablet 0.1 mg, 0.1 mg, Oral, BID, Pokhrel, Laxman, MD, 0.1 mg at 01/05/21 0926   clopidogrel (PLAVIX) tablet 75 mg, 75 mg, Oral, Daily, Donnetta Simpers, MD, 75 mg at 01/05/21 0926   insulin aspart (novoLOG) injection 0-15 Units, 0-15 Units, Subcutaneous, TID WC, Hosie Poisson, MD, 2 Units at 01/05/21 0900   insulin aspart (novoLOG) injection 0-5 Units, 0-5 Units, Subcutaneous, QHS, Hosie Poisson, MD, 3 Units at 01/04/21 2117   insulin detemir (LEVEMIR) injection 10 Units, 10 Units, Subcutaneous, BID, Charlynne Cousins, MD, 10 Units at 01/05/21 0925   levothyroxine (SYNTHROID) tablet 88 mcg, 88 mcg, Oral, Daily, Kyle, Tyrone A, DO, 88 mcg at 01/05/21 6144   losartan (COZAAR) tablet 100 mg, 100 mg, Oral, Daily, Pokhrel, Laxman, MD, 100 mg at 01/05/21 3154   MEDLINE mouth rinse, 15 mL, Mouth Rinse, BID, Charlynne Cousins, MD   ondansetron [facility]) injection 4 mg, 4 mg, Intravenous, Q6H PRN, de Sindy Messing, [place], MD, 4 mg at 01/04/21 1202   pantoprazole (PROTONIX) EC tablet 40 mg, 40 mg, Oral, Daily, Aileen Fass, Tammi Klippel, MD   polyethylene glycol (MIRALAX / GLYCOLAX) packet 17 g, 17 g, Oral, Daily, Heard, Courtney S, NP, 17 g at 01/05/21 0086   senna-docusate (Senokot-S) tablet 1 tablet, 1 tablet, Oral, Daily, Heard, Courtney S, NP, 1 tablet at 01/05/21 7619   sertraline (ZOLOFT) tablet 100 mg, 100 mg, Oral, Daily, Pokhrel, Laxman, MD, 100 mg at 01/05/21 5093  Patients Current Diet:  Diet Order             DIET DYS 2 Room service appropriate? Yes; Fluid consistency: Thin  Diet effective now           Diet - low sodium heart healthy                   Precautions / Restrictions Precautions Precautions: Fall Precaution Comments: SBP < 160 Restrictions Weight Bearing Restrictions: No   Has the patient had 2 or more falls or a fall with injury in the past year? No  Prior Activity Level Limited  Community (1-2x/wk): Pt. went out for appointments only  Prior Functional Level Self Care: Did the patient need help bathing, dressing, using the toilet or eating? Needed some help  Indoor Mobility: Did the patient need assistance with walking from room to room (with or without device)? Independent  Stairs: Did the patient need assistance with internal or external stairs (with or without device)? Needed some help  Functional Cognition: Did the patient need help planning regular tasks such as shopping or remembering to take medications? Needed some help  Home Assistive Devices / Equipment Home Assistive Devices/Equipment: Eyeglasses, Kasandra Knudsen (specify quad or straight), CBG Meter (single point cane) Home Equipment: None  Prior Device Use: Indicate devices/aids used by the patient prior  to current illness, exacerbation or injury? None of the above  Current Functional Level Cognition  Arousal/Alertness: Awake/alert Overall Cognitive Status: Difficult to assess Difficult to assess due to: Non-English speaking Orientation Level: Oriented X4 General Comments: slow processingl question Attention: Sustained Sustained Attention: Appears intact (during discourse) Memory:  (will assess further- recalled tx earlier but not details) Awareness: Impaired Awareness Impairment: Anticipatory impairment, Emergent impairment Problem Solving: Impaired Problem Solving Impairment: Functional basic Safety/Judgment: Impaired    Extremity Assessment (includes Sensation/Coordination)  Upper Extremity Assessment: LUE deficits/detail LUE Deficits / Details: mild flexor synergy of Lt UE - Brunnstrom stage 5.  Pt with minimal flexion extension actively of Lt hand LUE Sensation: decreased light touch LUE Coordination: decreased gross motor, decreased fine motor  Lower Extremity Assessment: Defer to PT evaluation LLE Deficits / Details: functional assessment with patient able to ambulate with no knee buckling but  fatigues quickly. Difficult to assess due to language barrier LLE Coordination: decreased gross motor    ADLs  Overall ADL's : Needs assistance/impaired Eating/Feeding: Set up Eating/Feeding Details (indicate cue type and reason): family is bringing in dentures Grooming: Wash/dry face, Sitting, Minimal assistance Grooming Details (indicate cue type and reason): EOB Upper Body Bathing: Minimal assistance, Sitting Lower Body Bathing: Moderate assistance, Sit to/from stand Upper Body Dressing : Moderate assistance, Sitting Lower Body Dressing: Moderate assistance, Sit to/from stand Lower Body Dressing Details (indicate cue type and reason): mod A to don socks.  Requires therapist to start the socks over her toes, then pt able to take over Toilet Transfer: Minimal assistance, +2 for safety/equipment, +2 for physical assistance, Ambulation, Regular Toilet Toilet Transfer Details (indicate cue type and reason): 2 person HHA, grasp too weak for RW currently Toileting- Clothing Manipulation and Hygiene: Total assistance (incontinenet) Toileting - Clothing Manipulation Details (indicate cue type and reason): pt is not calling out to use the bathroom adn has been instructin gto do so by nursing. Unsure if this is due to language barrier Functional mobility during ADLs: +2 for physical assistance, Minimal assistance General ADL Comments: increased assistance due to pt sitting prematurely - risk for falls    Mobility  Overal bed mobility: Needs Assistance Bed Mobility: Supine to Sit Supine to sit: HOB elevated, Mod assist Sit to supine: Min assist General bed mobility comments: Extra time and modA to ascend trunk, with pt initiating moving legs off L EOB.    Transfers  Overall transfer level: Needs assistance Equipment used: 2 person hand held assist, Rolling walker (2 wheeled) Transfers: Sit to/from Stand, Stand Pivot Transfers Sit to Stand: Min assist, +2 physical assistance Stand pivot  transfers: +2 physical assistance, Min assist (decreased safety awareness) General transfer comment: Sit <> stand from EOB 1x to RW and 2x from EOB to bil HHA and 1x from recliner to bil HHA, minAx2 to power up and steady. Pt unable to grasp RW efficiently with L UE, thus transitioned to supporting L UE and providing R HHA. During 1 transfer pt displayed a posterior lean with her toes coming off the ground, despite cues to correct. Pt quick to sit spontaneously. MinAx2 to stand step to L to recliner.    Ambulation / Gait / Stairs / Wheelchair Mobility  Ambulation/Gait Ambulation/Gait assistance: Min assist, +2 physical assistance Gait Distance (Feet): 3 Feet Assistive device: 2 person hand held assist Gait Pattern/deviations: Step-to pattern, Decreased stride length, Decreased step length - left, Decreased stance time - left, Decreased weight shift to left, Wide base of support General Gait Details:  Pt was able to ambulate with bil HHA.  left LE with decr step length but pt was able to clear foot.  Pt with short steps to L to transfer to recliner, with intent to ambulate further but pt easily fatiguing and spontaneously sitting this date. Additional standing bout cued pt to march in place. Gait velocity: decreased Gait velocity interpretation: <1.31 ft/sec, indicative of household ambulator    Posture / Balance Balance Overall balance assessment: Needs assistance Sitting-balance support: No upper extremity supported Sitting balance-Leahy Scale: Fair Standing balance support: Bilateral upper extremity supported Standing balance-Leahy Scale: Poor Standing balance comment: Requires at least one UE support and external support    Special needs/care consideration Diabetic management Yes, on novolog and levemir insulin in acute hospital, and Special service needs Pt. Requires [place] interpreter or that therapy be scheduled in the morning so family can assist with interpretation.   Previous Home  Environment (from acute therapy documentation) Living Arrangements: Other relatives  Lives With:  (family) Available Help at Discharge: Family, Available 24 hours/day Type of Home: House Home Layout: One level Home Access: Stairs to enter Technical brewer of Steps: 4-5 Bathroom Shower/Tub: Chiropodist: [place]: No Additional Comments: Pt lives with her daughter in law, 2 grandsons, and grand daughter who provide 24 hour assist  Discharge Living Setting Plans for Discharge Living Setting: Patient's home Type of Home at Discharge: House Discharge Home Layout: One level Discharge Home Access: Stairs to enter Entrance Stairs-Rails: None Entrance Stairs-Number of Steps: 4-5 Discharge Bathroom Shower/Tub: Tub/shower unit Discharge Bathroom Toilet: Standard Discharge Bathroom Accessibility: No Does the patient have any problems obtaining your medications?: No  Social/Family/Support Systems Patient Roles: Other (Comment) Has daughter in law and grandsons Contact Information: Mirabel Ahlgren - grandson - [phone] Anticipated Caregiver: Streed,Radojka (Daughter in Sports coach) Anticipated Caregiver's Contact Information: [phone] Ability/Limitations of Caregiver: Can provide min A Caregiver Availability: 24/7 (family to rotate to provide 24/7 support) Discharge Plan Discussed with Primary Caregiver: Yes Is Caregiver In Agreement with Plan?: Yes Does Caregiver/Family have Issues with Lodging/Transportation while Pt is in Rehab?: No  Goals Patient/Family Goal for Rehab: PT/OT/SLP supervision Expected length of stay: 10-12 days Cultural Considerations: [place] interpreter Pt/Family Agrees to Admission and willing to participate: Yes Program Orientation Provided & Reviewed with Pt/Caregiver Including Roles  & Responsibilities: Yes  Decrease burden of Care through IP rehab admission: Specialzed equipment needs, Decrease number of  caregivers, Bowel and bladder program, and Patient/family education  Possible need for SNF placement upon discharge: not anticipated  Patient Condition: I have reviewed medical records from [facility] , spoken with CM, and patient and family member. I met with patient at the bedside and discussed via phone for inpatient rehabilitation assessment.  Patient will benefit from ongoing PT, OT, and SLP, can actively participate in 3 hours of therapy a day 5 days of the week, and can make measurable gains during the admission.  Patient will also benefit from the coordinated team approach during an Inpatient Acute Rehabilitation admission.  The patient will receive intensive therapy as well as Rehabilitation physician, nursing, social worker, and care management interventions.  Due to safety, skin/wound care, disease management, medication administration, pain management, and patient education the patient requires 24 hour a day rehabilitation nursing.  The patient is currently min to mod assist with mobility and basic ADLs.  Discharge setting and therapy post discharge at home with home health is anticipated.  Patient has agreed to participate in  the Acute Inpatient Rehabilitation Program and will admit today.  Preadmission Screen Completed By:  Retta Diones, 01/05/2021 11:04 AM ______________________________________________________________________   Discussed status with Dr. Naaman Plummer and Dr. Dagoberto Ligas on 01/05/21 at 10:34 and received approval for admission today.  Admission Coordinator:  Retta Diones, RN, time 10;34/Date 01/05/21   Assessment/Plan: Diagnosis: Does the need for close, 24 hr/day Medical supervision in concert with the patient's rehab needs make it unreasonable for this patient to be served in a less intensive setting? Yes Co-Morbidities requiring supervision/potential complications: R brain/CVA, L hemiparesis; DM, CAD, s/p R carotid stenting Due to bladder management, bowel  management, safety, skin/wound care, disease management, medication administration, pain management, and patient education, does the patient require 24 hr/day rehab nursing? Yes Does the patient require coordinated care of a physician, rehab nurse, PT, OT, and SLP to address physical and functional deficits in the context of the above medical diagnosis(es)? Yes Addressing deficits in the following areas: balance, endurance, locomotion, strength, transferring, bowel/bladder control, bathing, dressing, feeding, grooming, and toileting Can the patient actively participate in an intensive therapy program of at least 3 hrs of therapy 5 days a week? Yes The potential for patient to make measurable gains while on inpatient rehab is good Anticipated functional outcomes upon discharge from inpatient rehab: supervision PT, supervision OT, supervision SLP Estimated rehab length of stay to reach the above functional goals is: 10-12 days Anticipated discharge destination: Home 10. Overall Rehab/Functional Prognosis: good   MD Signature:

## 2021-01-05 ENCOUNTER — Inpatient Hospital Stay (HOSPITAL_COMMUNITY)
Admission: RE | Admit: 2021-01-05 | Discharge: 2021-01-20 | DRG: 057 | Disposition: A | Discharge status: Home Health | Payer: Medicare Other | Source: Intra-hospital | Attending: Physical Medicine and Rehabilitation | Admitting: Physical Medicine and Rehabilitation

## 2021-01-05 ENCOUNTER — Other Ambulatory Visit: Payer: Self-pay

## 2021-01-05 ENCOUNTER — Inpatient Hospital Stay (HOSPITAL_COMMUNITY): Payer: Medicare Other

## 2021-01-05 ENCOUNTER — Encounter (HOSPITAL_COMMUNITY): Payer: Self-pay | Admitting: Physical Medicine and Rehabilitation

## 2021-01-05 DIAGNOSIS — I69391 Dysphagia following cerebral infarction: Secondary | ICD-10-CM

## 2021-01-05 DIAGNOSIS — I6300 Cerebral infarction due to thrombosis of unspecified precerebral artery: Secondary | ICD-10-CM

## 2021-01-05 DIAGNOSIS — E11649 Type 2 diabetes mellitus with hypoglycemia without coma: Secondary | ICD-10-CM | POA: Diagnosis not present

## 2021-01-05 DIAGNOSIS — I63231 Cerebral infarction due to unspecified occlusion or stenosis of right carotid arteries: Secondary | ICD-10-CM | POA: Diagnosis not present

## 2021-01-05 DIAGNOSIS — R001 Bradycardia, unspecified: Secondary | ICD-10-CM | POA: Diagnosis not present

## 2021-01-05 DIAGNOSIS — M25532 Pain in left wrist: Secondary | ICD-10-CM

## 2021-01-05 DIAGNOSIS — I69354 Hemiplegia and hemiparesis following cerebral infarction affecting left non-dominant side: Principal | ICD-10-CM

## 2021-01-05 DIAGNOSIS — R209 Unspecified disturbances of skin sensation: Secondary | ICD-10-CM | POA: Diagnosis present

## 2021-01-05 DIAGNOSIS — I69319 Unspecified symptoms and signs involving cognitive functions following cerebral infarction: Secondary | ICD-10-CM

## 2021-01-05 DIAGNOSIS — I6359 Cerebral infarction due to unspecified occlusion or stenosis of other cerebral artery: Secondary | ICD-10-CM | POA: Diagnosis not present

## 2021-01-05 DIAGNOSIS — R1312 Dysphagia, oropharyngeal phase: Secondary | ICD-10-CM | POA: Diagnosis present

## 2021-01-05 DIAGNOSIS — I69398 Other sequelae of cerebral infarction: Secondary | ICD-10-CM

## 2021-01-05 DIAGNOSIS — E785 Hyperlipidemia, unspecified: Secondary | ICD-10-CM | POA: Diagnosis present

## 2021-01-05 DIAGNOSIS — F411 Generalized anxiety disorder: Secondary | ICD-10-CM | POA: Diagnosis present

## 2021-01-05 DIAGNOSIS — B370 Candidal stomatitis: Secondary | ICD-10-CM | POA: Diagnosis not present

## 2021-01-05 DIAGNOSIS — R2689 Other abnormalities of gait and mobility: Secondary | ICD-10-CM | POA: Diagnosis present

## 2021-01-05 DIAGNOSIS — I639 Cerebral infarction, unspecified: Secondary | ICD-10-CM | POA: Diagnosis not present

## 2021-01-05 DIAGNOSIS — J9811 Atelectasis: Secondary | ICD-10-CM | POA: Diagnosis present

## 2021-01-05 DIAGNOSIS — Z833 Family history of diabetes mellitus: Secondary | ICD-10-CM

## 2021-01-05 DIAGNOSIS — I252 Old myocardial infarction: Secondary | ICD-10-CM

## 2021-01-05 DIAGNOSIS — D72829 Elevated white blood cell count, unspecified: Secondary | ICD-10-CM | POA: Diagnosis not present

## 2021-01-05 DIAGNOSIS — Z91010 Allergy to peanuts: Secondary | ICD-10-CM

## 2021-01-05 DIAGNOSIS — I2510 Atherosclerotic heart disease of native coronary artery without angina pectoris: Secondary | ICD-10-CM | POA: Diagnosis present

## 2021-01-05 DIAGNOSIS — Z955 Presence of coronary angioplasty implant and graft: Secondary | ICD-10-CM | POA: Diagnosis not present

## 2021-01-05 DIAGNOSIS — Z8249 Family history of ischemic heart disease and other diseases of the circulatory system: Secondary | ICD-10-CM

## 2021-01-05 DIAGNOSIS — E7800 Pure hypercholesterolemia, unspecified: Secondary | ICD-10-CM | POA: Diagnosis not present

## 2021-01-05 DIAGNOSIS — E119 Type 2 diabetes mellitus without complications: Secondary | ICD-10-CM

## 2021-01-05 DIAGNOSIS — I700 Atherosclerosis of aorta: Secondary | ICD-10-CM | POA: Diagnosis present

## 2021-01-05 DIAGNOSIS — F32A Depression, unspecified: Secondary | ICD-10-CM | POA: Diagnosis present

## 2021-01-05 DIAGNOSIS — I6330 Cerebral infarction due to thrombosis of unspecified cerebral artery: Secondary | ICD-10-CM | POA: Diagnosis not present

## 2021-01-05 DIAGNOSIS — F419 Anxiety disorder, unspecified: Secondary | ICD-10-CM | POA: Diagnosis present

## 2021-01-05 DIAGNOSIS — I10 Essential (primary) hypertension: Secondary | ICD-10-CM

## 2021-01-05 DIAGNOSIS — Z881 Allergy status to other antibiotic agents status: Secondary | ICD-10-CM | POA: Diagnosis not present

## 2021-01-05 DIAGNOSIS — I351 Nonrheumatic aortic (valve) insufficiency: Secondary | ICD-10-CM | POA: Diagnosis present

## 2021-01-05 DIAGNOSIS — Z79890 Hormone replacement therapy (postmenopausal): Secondary | ICD-10-CM

## 2021-01-05 DIAGNOSIS — E876 Hypokalemia: Secondary | ICD-10-CM | POA: Diagnosis not present

## 2021-01-05 DIAGNOSIS — E039 Hypothyroidism, unspecified: Secondary | ICD-10-CM | POA: Diagnosis present

## 2021-01-05 DIAGNOSIS — R197 Diarrhea, unspecified: Secondary | ICD-10-CM | POA: Diagnosis not present

## 2021-01-05 DIAGNOSIS — R52 Pain, unspecified: Secondary | ICD-10-CM

## 2021-01-05 DIAGNOSIS — Z79899 Other long term (current) drug therapy: Secondary | ICD-10-CM

## 2021-01-05 DIAGNOSIS — I951 Orthostatic hypotension: Secondary | ICD-10-CM | POA: Diagnosis not present

## 2021-01-05 DIAGNOSIS — Z7984 Long term (current) use of oral hypoglycemic drugs: Secondary | ICD-10-CM

## 2021-01-05 DIAGNOSIS — R0902 Hypoxemia: Secondary | ICD-10-CM | POA: Diagnosis present

## 2021-01-05 DIAGNOSIS — I63031 Cerebral infarction due to thrombosis of right carotid artery: Secondary | ICD-10-CM | POA: Diagnosis not present

## 2021-01-05 LAB — GLUCOSE, CAPILLARY
Glucose-Capillary: 125 mg/dL — ABNORMAL HIGH (ref 70–99)
Glucose-Capillary: 128 mg/dL — ABNORMAL HIGH (ref 70–99)
Glucose-Capillary: 158 mg/dL — ABNORMAL HIGH (ref 70–99)
Glucose-Capillary: 246 mg/dL — ABNORMAL HIGH (ref 70–99)

## 2021-01-05 LAB — TRIGLYCERIDES: Triglycerides: 73 mg/dL (ref <150)

## 2021-01-05 MED ORDER — PROCHLORPERAZINE MALEATE 5 MG PO TABS
5.0000 mg | ORAL_TABLET | Freq: Four times a day (QID) | ORAL | Status: DC | PRN
  Administered 2021-01-14: 10 mg via ORAL
  Filled 2021-01-05: qty 2

## 2021-01-05 MED ORDER — BISACODYL 10 MG RE SUPP: 10.0000 mg | Freq: Every day | RECTAL | Status: DC | PRN

## 2021-01-05 MED ORDER — CLONIDINE HCL 0.1 MG PO TABS
0.1000 mg | ORAL_TABLET | Freq: Two times a day (BID) | ORAL | Status: DC
  Administered 2021-01-05 – 2021-01-20 (×29): 0.1 mg via ORAL
  Filled 2021-01-05 (×31): qty 1

## 2021-01-05 MED ORDER — ASPIRIN EC 325 MG PO TBEC
325.0000 mg | DELAYED_RELEASE_TABLET | Freq: Every day | ORAL | Status: DC
  Administered 2021-01-06 – 2021-01-20 (×15): 325 mg via ORAL
  Filled 2021-01-05 (×15): qty 1

## 2021-01-05 MED ORDER — CLOPIDOGREL BISULFATE 75 MG PO TABS: 75.0000 mg | ORAL_TABLET | Freq: Every day | ORAL | Status: DC

## 2021-01-05 MED ORDER — ORAL CARE MOUTH RINSE
15.0000 mL | Freq: Two times a day (BID) | OROMUCOSAL | Status: DC
  Administered 2021-01-05: 15 mL via OROMUCOSAL

## 2021-01-05 MED ORDER — ALUM & MAG HYDROXIDE-SIMETH 200-200-20 MG/5ML PO SUSP: 30.0000 mL | ORAL | Status: DC | PRN

## 2021-01-05 MED ORDER — PANTOPRAZOLE SODIUM 40 MG PO TBEC
40.0000 mg | DELAYED_RELEASE_TABLET | Freq: Every day | ORAL | Status: DC
  Administered 2021-01-06 – 2021-01-20 (×15): 40 mg via ORAL
  Filled 2021-01-05 (×15): qty 1

## 2021-01-05 MED ORDER — LEVOTHYROXINE SODIUM 88 MCG PO TABS
88.0000 ug | ORAL_TABLET | Freq: Every day | ORAL | Status: DC
  Administered 2021-01-06 – 2021-01-20 (×15): 88 ug via ORAL
  Filled 2021-01-05 (×16): qty 1

## 2021-01-05 MED ORDER — PROCHLORPERAZINE 25 MG RE SUPP: 12.5000 mg | Freq: Four times a day (QID) | RECTAL | Status: DC | PRN

## 2021-01-05 MED ORDER — INSULIN ASPART 100 UNIT/ML IJ SOLN
0.0000 [IU] | Freq: Every day | INTRAMUSCULAR | Status: DC
  Administered 2021-01-05: 2 [IU] via SUBCUTANEOUS

## 2021-01-05 MED ORDER — SENNOSIDES-DOCUSATE SODIUM 8.6-50 MG PO TABS: 2.0000 | ORAL_TABLET | Freq: Every day | ORAL | Status: DC

## 2021-01-05 MED ORDER — ASPIRIN 325 MG PO TBEC: 325.0000 mg | DELAYED_RELEASE_TABLET | Freq: Every day | ORAL | 0 refills | Status: DC

## 2021-01-05 MED ORDER — DIPHENHYDRAMINE HCL 12.5 MG/5ML PO ELIX: 12.5000 mg | ORAL_SOLUTION | Freq: Four times a day (QID) | ORAL | Status: DC | PRN

## 2021-01-05 MED ORDER — LIVING WELL WITH DIABETES BOOK
Freq: Once | Status: AC
  Filled 2021-01-05: qty 1

## 2021-01-05 MED ORDER — AMLODIPINE BESYLATE 10 MG PO TABS
10.0000 mg | ORAL_TABLET | Freq: Every day | ORAL | Status: DC
  Filled 2021-01-05: qty 1

## 2021-01-05 MED ORDER — PANTOPRAZOLE SODIUM 40 MG PO TBEC
40.0000 mg | DELAYED_RELEASE_TABLET | Freq: Every day | ORAL | Status: DC
  Administered 2021-01-05: 40 mg via ORAL
  Filled 2021-01-05: qty 1

## 2021-01-05 MED ORDER — MELATONIN 3 MG PO TABS: 3.0000 mg | ORAL_TABLET | Freq: Every evening | ORAL | Status: DC | PRN

## 2021-01-05 MED ORDER — SODIUM CHLORIDE 0.9% FLUSH
3.0000 mL | Freq: Two times a day (BID) | INTRAVENOUS | Status: DC
  Administered 2021-01-05 – 2021-01-19 (×19): 3 mL via INTRAVENOUS

## 2021-01-05 MED ORDER — INSULIN ASPART 100 UNIT/ML IJ SOLN
0.0000 [IU] | Freq: Three times a day (TID) | INTRAMUSCULAR | Status: DC
  Administered 2021-01-05: 3 [IU] via SUBCUTANEOUS
  Administered 2021-01-07 – 2021-01-09 (×4): 2 [IU] via SUBCUTANEOUS
  Administered 2021-01-09: 3 [IU] via SUBCUTANEOUS
  Administered 2021-01-10 – 2021-01-11 (×3): 2 [IU] via SUBCUTANEOUS
  Administered 2021-01-11 (×2): 3 [IU] via SUBCUTANEOUS
  Administered 2021-01-12: 2 [IU] via SUBCUTANEOUS
  Administered 2021-01-12: 3 [IU] via SUBCUTANEOUS
  Administered 2021-01-12 – 2021-01-13 (×2): 2 [IU] via SUBCUTANEOUS
  Administered 2021-01-13: 3 [IU] via SUBCUTANEOUS
  Administered 2021-01-13: 2 [IU] via SUBCUTANEOUS
  Administered 2021-01-14: 3 [IU] via SUBCUTANEOUS
  Administered 2021-01-14: 2 [IU] via SUBCUTANEOUS
  Administered 2021-01-15: 3 [IU] via SUBCUTANEOUS
  Administered 2021-01-15: 2 [IU] via SUBCUTANEOUS
  Administered 2021-01-16 – 2021-01-17 (×3): 3 [IU] via SUBCUTANEOUS
  Administered 2021-01-17: 2 [IU] via SUBCUTANEOUS
  Administered 2021-01-18 (×2): 3 [IU] via SUBCUTANEOUS
  Administered 2021-01-19 – 2021-01-20 (×3): 2 [IU] via SUBCUTANEOUS

## 2021-01-05 MED ORDER — ACETAMINOPHEN 325 MG PO TABS
325.0000 mg | ORAL_TABLET | ORAL | Status: DC | PRN
  Filled 2021-01-05: qty 2

## 2021-01-05 MED ORDER — POLYETHYLENE GLYCOL 3350 17 G PO PACK: 17.0000 g | PACK | Freq: Every day | ORAL | Status: DC | PRN

## 2021-01-05 MED ORDER — METFORMIN HCL ER 750 MG PO TB24
1500.0000 mg | ORAL_TABLET | Freq: Every day | ORAL | Status: DC
  Administered 2021-01-06 – 2021-01-12 (×7): 1500 mg via ORAL
  Filled 2021-01-05 (×7): qty 2

## 2021-01-05 MED ORDER — LOSARTAN POTASSIUM 50 MG PO TABS
100.0000 mg | ORAL_TABLET | Freq: Every day | ORAL | Status: DC
  Filled 2021-01-05: qty 2

## 2021-01-05 MED ORDER — SERTRALINE HCL 100 MG PO TABS
100.0000 mg | ORAL_TABLET | Freq: Every day | ORAL | Status: DC
  Administered 2021-01-06 – 2021-01-20 (×15): 100 mg via ORAL
  Filled 2021-01-05 (×15): qty 1

## 2021-01-05 MED ORDER — EXERCISE FOR HEART AND HEALTH BOOK
Freq: Once | Status: AC
  Filled 2021-01-05: qty 1

## 2021-01-05 MED ORDER — ENOXAPARIN SODIUM 40 MG/0.4ML IJ SOSY
40.0000 mg | PREFILLED_SYRINGE | INTRAMUSCULAR | Status: DC
  Administered 2021-01-05 – 2021-01-16 (×12): 40 mg via SUBCUTANEOUS
  Filled 2021-01-05 (×12): qty 0.4

## 2021-01-05 MED ORDER — FLEET ENEMA 7-19 GM/118ML RE ENEM: 1.0000 | ENEMA | Freq: Once | RECTAL | Status: DC | PRN

## 2021-01-05 MED ORDER — INSULIN DETEMIR 100 UNIT/ML ~~LOC~~ SOLN
10.0000 [IU] | Freq: Two times a day (BID) | SUBCUTANEOUS | Status: AC
  Administered 2021-01-05: 10 [IU] via SUBCUTANEOUS
  Filled 2021-01-05: qty 0.1

## 2021-01-05 MED ORDER — CLOPIDOGREL BISULFATE 75 MG PO TABS
75.0000 mg | ORAL_TABLET | Freq: Every day | ORAL | Status: AC
  Administered 2021-01-06 – 2021-01-20 (×15): 75 mg via ORAL
  Filled 2021-01-05 (×15): qty 1

## 2021-01-05 MED ORDER — PROCHLORPERAZINE EDISYLATE 10 MG/2ML IJ SOLN: 5.0000 mg | Freq: Four times a day (QID) | INTRAMUSCULAR | Status: DC | PRN

## 2021-01-05 MED ORDER — ATORVASTATIN CALCIUM 40 MG PO TABS
40.0000 mg | ORAL_TABLET | Freq: Every day | ORAL | Status: DC
  Administered 2021-01-06 – 2021-01-20 (×15): 40 mg via ORAL
  Filled 2021-01-05 (×15): qty 1

## 2021-01-05 MED ORDER — GUAIFENESIN-DM 100-10 MG/5ML PO SYRP: 5.0000 mL | ORAL_SOLUTION | Freq: Four times a day (QID) | ORAL | Status: DC | PRN

## 2021-01-05 MED ORDER — GLIMEPIRIDE 2 MG PO TABS
2.0000 mg | ORAL_TABLET | Freq: Every day | ORAL | Status: DC
  Administered 2021-01-06 – 2021-01-20 (×14): 2 mg via ORAL
  Filled 2021-01-05 (×15): qty 1

## 2021-01-05 MED ORDER — ACETAMINOPHEN 325 MG PO TABS
650.0000 mg | ORAL_TABLET | Freq: Three times a day (TID) | ORAL | Status: DC
  Administered 2021-01-05 – 2021-01-20 (×60): 650 mg via ORAL
  Filled 2021-01-05 (×59): qty 2

## 2021-01-05 NOTE — H&P (Signed)
Physical Medicine and Rehabilitation Admission H&P         Chief Complaint  Patient presents with   Functional deficits due to stroke      Left sided weakness with sensory deficits and visual deficits.       HPI:  Jo Larson is an 87 year old female with history of CAD, T2DM, HTN, depression/anxiety who was admitted on 12/30/20 with progressive LUE weakness X few days. MRI/MRA brain done revealing patchy acute infarcts in right frontoparietal lobes and minimal right occipital lobe involvement and no intracranial stenosis.  2 D echo done revealing EF 60-65% with moderate concentric LVH, aortic sclerosis with mild AVR. Neurology recommended DAPT X 3 weeks followed by ASA alone. CTA head/neck ordered for work up on 06/13 and showed 80% stenosis in right carotid bulb and 1-2 mm outpouching from left M1/MCA segment at origin lenticulostriate artery s/o small aneurysm v/s infundibulum.     She was evaluated by Dr. Janifer Adie and underwent diagnostic cerebral angiogram with right carotid stenting on 06/14. Post procedure CT without evidence of bleed. Post procedure to continue DAPT X 3 months (will need carotid ultrasound for follow up).  She continues to be limited by left sided weakness with sensory deficits, visual deficits, cognitive deficits with delay in processing and possibly motor planning. CIR recommended due to functional decline.   Pt was completely asleep the entire time I attempted to evaluate her- she opened her eyes for a few seconds, and then closed again. Per nurse, doesn't have any skin issues- has sacral foam due to prevention- was "c/o of being sore all over after sitting up in chair 2 hours"- gave her cold cloth on forehead and heating pad per pt request.      Review of Systems Constitutional:  Negative for chills and fever. HENT:  Positive for hearing loss.   Eyes:  Positive for blurred vision (problems with vision before--now more blurry). Respiratory:   Positive for shortness of breath ("it comes and goes") and stridor.   Gastrointestinal:  Negative for abdominal pain. Musculoskeletal:  Positive for falls (05/2020 with radius Fx) and myalgias. Skin:  Negative for itching and rash. Neurological:  Positive for dizziness (with activity), sensory change, weakness and headaches (Pain all the time--chronic but worse since stroke.).  All other systems reviewed and are negative.         Past Medical History:  Diagnosis Date   Abnormal chest x-ray     Angioedema      a. 05/2014 - felt to be 2/2 clindamycin therapy.   Anxiety     CAD (coronary artery disease)      a. 07/2014 NSTEMI/PCI: LM nl, LAD min irregs, LCX 58m(4.0x18 Xience DES), LPDA 90 (249mvessel->med Rx), RCA nl, EF 60%.   Diabetes mellitus without complication ([place]    Hyperlipemia     Hypertension     Hypothyroidism             Past Surgical History:  Procedure Laterality Date   INTUBATION NASOTRACHEAL N/A 04/03/2013    Procedure: FIBEROPTIC NASAL-TRACHEAL INTUBATION;  Surgeon: DwMelida QuitterMD;  Location: [facility];  Service: ENT;  Laterality: N/A;   IR ANGIO INTRA EXTRACRAN SEL COM CAROTID INNOMINATE BILAT MOD SED   01/03/2021   IR ANGIO VERTEBRAL SEL VERTEBRAL UNI R MOD SED   01/03/2021   IR INTRAVSC STENT CERV CAROTID W/EMB-PROT MOD SED INCL ANGIO   01/03/2021   IR [place] VASC  ACCESS RIGHT   01/03/2021   LEFT HEART CATHETERIZATION WITH CORONARY ANGIOGRAM N/A 07/26/2014    Procedure: LEFT HEART CATHETERIZATION WITH CORONARY ANGIOGRAM;  Surgeon: Lorretta Harp, MD;  Location: [facility] CATH LAB;  Service: Cardiovascular;  Laterality: N/A;   RADIOLOGY WITH ANESTHESIA Right 01/03/2021    Procedure: IR WITH ANESTHESIA - CAROTID STENT;  Surgeon: Pedro Earls, MD;  Location: [facility];  Service: Radiology;  Laterality: Right;           Family History  Problem Relation Age of Onset   Hypertension Father     Diabetes Father        Social History:  Lives with  family-daughter in law/grandsons. Per  reports that she has never smoked. She has never used smokeless tobacco. She reports that she does not drink alcohol and does not use drugs.          Allergies  Allergen Reactions   Clindamycin/Lincomycin Swelling      Tongue swelling, angioedema   Peanut Butter Flavor Shortness Of Breath      Swelling and airway closes   Peanuts [Peanut Oil] Shortness Of Breath      Swelling and airway closes                       Medications Prior to Admission  Medication Sig Dispense Refill   acetaminophen (TYLENOL) 325 MG tablet Take 325 mg by mouth in the morning and at bedtime.       amLODipine (NORVASC) 10 MG tablet Take 1 tablet by mouth daily.       atorvastatin (LIPITOR) 20 MG tablet Take 1 tablet by mouth daily.       Cholecalciferol 25 MCG (1000 UT) capsule Take 1,000 Units by mouth daily.       cloNIDine (CATAPRES) 0.1 MG tablet Take 1 tablet by mouth 2 (two) times daily.       esomeprazole (NEXIUM) 40 MG capsule Take 1 capsule by mouth daily with breakfast.       glimepiride (AMARYL) 2 MG tablet Take 2 mg by mouth daily.       levothyroxine (SYNTHROID) 88 MCG tablet Take 88 mcg by mouth daily.       losartan (COZAAR) 100 MG tablet Take 1 tablet (100 mg total) by mouth daily. 60 tablet 0   metFORMIN (GLUCOPHAGE-XR) 500 MG 24 hr tablet Take 1,500 mg by mouth daily with breakfast.       metoprolol tartrate (LOPRESSOR) 25 MG tablet Take 12.5 mg by mouth 2 (two) times daily.       sertraline (ZOLOFT) 100 MG tablet Take 1 tablet by mouth daily.       amLODipine (NORVASC) 5 MG tablet Take 1 tablet (5 mg total) by mouth daily. 90 tablet 3   azithromycin (ZITHROMAX) 250 MG tablet Take 1 tablet (250 mg total) by mouth daily. Take first 2 tablets together, then 1 every day until finished. (Patient not taking: Reported on 12/30/2020) 6 tablet 0   diphenhydrAMINE (BENADRYL) 25 MG tablet Take 1 tablet (25 mg total) by mouth every 6 (six) hours as needed for  allergies. (Patient not taking: Reported on 12/30/2020) 20 tablet 0   famotidine (PEPCID) 20 MG tablet Take 1 tablet (20 mg total) by mouth 2 (two) times daily. (Patient not taking: Reported on 12/30/2020) 30 tablet 0   LORazepam (ATIVAN) 1 MG tablet Take 1 tablet (1 mg total) by mouth 3 (three) times daily as needed for  anxiety. (Patient not taking: Reported on 12/30/2020) 15 tablet 0   predniSONE (DELTASONE) 20 MG tablet 2 tabs po daily x 3 days (Patient not taking: Reported on 12/30/2020) 6 tablet 0      Drug Regimen Review  Drug regimen was reviewed and remains appropriate with no significant issues identified   Home: Home Living Family/patient expects to be discharged to:: Private residence Living Arrangements: Other relatives Available Help at Discharge: Family, Available 24 hours/day Type of Home: House Home Access: Stairs to enter Technical brewer of Steps: 4-5 Home Layout: One level Bathroom Shower/Tub: Chiropodist: Standard Home Equipment: None Additional Comments: Pt lives with her daughter in law, 2 grandsons, and grand daughter who provide 24 hour assist  Lives With:  (family)   Functional History: Prior Function Level of Independence: Needs assistance Gait / Transfers Assistance Needed: Daughter in law report pt ambulated without AE, and has no history of falls ADL's / Homemaking Assistance Needed: Daughter in law reports pt was mod I with all ADLs except showering   Functional Status:  Mobility: Bed Mobility Overal bed mobility: Needs Assistance Bed Mobility: Supine to Sit Supine to sit: Mod assist, HOB elevated Sit to supine: Min assist General bed mobility comments: Extra time and modA to ascend trunk, with pt initiating moving legs off L EOB. Transfers Overall transfer level: Needs assistance Equipment used: 2 person hand held assist, Rolling walker (2 wheeled) Transfers: Sit to/from Stand, Stand Pivot Transfers Sit to Stand: Min  assist, +2 physical assistance Stand pivot transfers: Min assist, +2 physical assistance, +2 safety/equipment General transfer comment: Sit <> stand from EOB 1x to RW and 2x from EOB to bil HHA and 1x from recliner to bil HHA, minAx2 to power up and steady. Pt unable to grasp RW efficiently with L UE, thus transitioned to supporting L UE and providing R HHA. During 1 transfer pt displayed a posterior lean with her toes coming off the ground, despite cues to correct. Pt quick to sit spontaneously. MinAx2 to stand step to L to recliner. Ambulation/Gait Ambulation/Gait assistance: Min assist, +2 physical assistance Gait Distance (Feet): 3 Feet Assistive device: 2 person hand held assist Gait Pattern/deviations: Step-to pattern, Decreased stride length, Decreased step length - left, Decreased stance time - left, Decreased weight shift to left, Wide base of support General Gait Details: Pt was able to ambulate with bil HHA.  left LE with decr step length but pt was able to clear foot.  Pt with short steps to L to transfer to recliner, with intent to ambulate further but pt easily fatiguing and spontaneously sitting this date. Additional standing bout cued pt to march in place. Gait velocity: decreased Gait velocity interpretation: <1.31 ft/sec, indicative of household ambulator   ADL: ADL Overall ADL's : Needs assistance/impaired Eating/Feeding: Modified independent, Bed level Grooming: Wash/dry face, Sitting, Minimal assistance Grooming Details (indicate cue type and reason): EOB Upper Body Bathing: Minimal assistance, Sitting Lower Body Bathing: Moderate assistance, Sit to/from stand Upper Body Dressing : Moderate assistance, Sitting Lower Body Dressing: Moderate assistance, Sit to/from stand Lower Body Dressing Details (indicate cue type and reason): mod A to don socks.  Requires therapist to start the socks over her toes, then pt able to take over Toilet Transfer: Minimal assistance, +2 for  safety/equipment, +2 for physical assistance, Ambulation, Regular Toilet Toilet Transfer Details (indicate cue type and reason): 2 person HHA, grasp too weak for RW currently Toileting- Clothing Manipulation and Hygiene: Maximal assistance, Sit to/from stand Functional  mobility during ADLs: Minimal assistance, +2 for physical assistance, +2 for safety/equipment   Cognition: Cognition Overall Cognitive Status: Within Functional Limits for tasks assessed Arousal/Alertness: Awake/alert Orientation Level: Oriented X4 Attention: Sustained Sustained Attention: Appears intact (during discourse) Memory:  (will assess further- recalled tx earlier but not details) Awareness: Impaired Awareness Impairment: Anticipatory impairment, Emergent impairment Problem Solving: Impaired Problem Solving Impairment: Functional basic Safety/Judgment: Impaired Cognition Arousal/Alertness: Awake/alert Behavior During Therapy: WFL for tasks assessed/performed Overall Cognitive Status: Within Functional Limits for tasks assessed General Comments: Ipad interpreter used Difficult to assess due to: Non-English speaking     Blood pressure (!) 131/42, pulse 61, temperature 100.1 F (37.8 C), resp. rate (!) 21, height 5' (1.524 m), weight 74.4 kg, SpO2 98 %. Physical Exam Vitals and nursing note reviewed. Constitutional:      Appearance: She is obese.    Comments: Pt is elderly woman laying supine in bed- didn't wake except for a few seconds to tactile and verbal stimuli; NAD; wearing O2 per [place] 2L  HENT:    Head: Normocephalic.    Comments: Trace L facial droop at rest- but also turned slightly to R, so looks like B/L facial droop? Not likely. Couldn't assess tongue Cold washcloth on forehead    Right Ear: External ear normal.    Left Ear: External ear normal.    Nose: Nose normal. No congestion.    Comments: O2 by [place] in place- nares a little dry    Mouth/Throat:    Mouth: Mucous membranes are dry.     Pharynx: Oropharynx is clear.    Comments: sleeping with mouth open slightly-  Eyes:    Comments: eyes closed the entire time.   Cardiovascular:    Rate and Rhythm: Normal rate and regular rhythm.    Heart sounds: Normal heart sounds. No murmur heard.   No gallop.    Comments: RRR- rate in 60s- no JVD seen Pulmonary:    Comments: CTA B/L- no W/R/R- good air movement   Abdominal:    Comments: Soft, NT, ND, (+)BS      Musculoskeletal:    Cervical back: Normal range of motion and neck supple.    Comments: Spontaneous movement (due to tactile stimuli) seen of B/L feet/ankles as well as L hand- appeared less than expected in LUE- attempted to pull LUE away from me, so biceps at least 3/5 based on exam   Skin:    Comments: Sacral foam in place- per nurse, for prevention  L hand IV- pressure dressing on L wrist (nurse says it's old)  Neurological:    Mental Status: She is easily aroused. She is lethargic.    Comments: Sleepy and tended to keep eyes closed. Flat affect with soft, slurred speech--edentulous with left facial weakness. Question bilateral visual field deficits. Left hemiplegia with sensory deficits and question apraxia v/s inattention. Not able to keep eyes open, so sleepy/sedated.    Psychiatric:    Comments: lethargic      Lab Results Last 48 Hours        Results for orders placed or performed during the hospital encounter of 12/30/20 (from the past 48 hour(s))  Glucose, capillary     Status: Abnormal    Collection Time: 01/03/21 11:48 AM  Result Value Ref Range    Glucose-Capillary 170 (H) 70 - 99 mg/dL      Comment: Glucose reference range applies only to samples taken after fasting for at least 8 hours.  POCT Activated clotting time  Status: None    Collection Time: 01/03/21  2:15 PM  Result Value Ref Range    Activated Clotting Time 208 seconds  POCT Activated clotting time     Status: None    Collection Time: 01/03/21  2:34 PM  Result Value Ref Range     Activated Clotting Time 202 seconds  POCT Activated clotting time     Status: None    Collection Time: 01/03/21  3:13 PM  Result Value Ref Range    Activated Clotting Time 202 seconds  Glucose, capillary     Status: Abnormal    Collection Time: 01/03/21  4:05 PM  Result Value Ref Range    Glucose-Capillary 105 (H) 70 - 99 mg/dL      Comment: Glucose reference range applies only to samples taken after fasting for at least 8 hours.  Glucose, capillary     Status: Abnormal    Collection Time: 01/03/21  8:18 PM  Result Value Ref Range    Glucose-Capillary 144 (H) 70 - 99 mg/dL      Comment: Glucose reference range applies only to samples taken after fasting for at least 8 hours.  Glucose, capillary     Status: Abnormal    Collection Time: 01/03/21 11:04 PM  Result Value Ref Range    Glucose-Capillary 203 (H) 70 - 99 mg/dL      Comment: Glucose reference range applies only to samples taken after fasting for at least 8 hours.  MRSA Next Gen by PCR, Nasal     Status: None    Collection Time: 01/03/21 11:05 PM    Specimen: Nasal Mucosa; Nasal Swab  Result Value Ref Range    MRSA by PCR Next Gen NOT DETECTED NOT DETECTED      Comment: (NOTE) The GeneXpert MRSA Assay (FDA approved for NASAL specimens only), is one component of a comprehensive MRSA colonization surveillance program. It is not intended to diagnose MRSA infection nor to guide or monitor treatment for MRSA infections. Test performance is not FDA approved in patients less than 29 years old. Performed at [facility], [place] [address] [place], [place] [address]    Glucose, capillary     Status: Abnormal    Collection Time: 01/04/21  7:39 AM  Result Value Ref Range    Glucose-Capillary 330 (H) 70 - 99 mg/dL      Comment: Glucose reference range applies only to samples taken after fasting for at least 8 hours.  Glucose, capillary     Status: Abnormal    Collection Time: 01/04/21 11:54 AM  Result Value Ref Range     Glucose-Capillary 305 (H) 70 - 99 mg/dL      Comment: Glucose reference range applies only to samples taken after fasting for at least 8 hours.  Glucose, capillary     Status: Abnormal    Collection Time: 01/04/21  6:31 PM  Result Value Ref Range    Glucose-Capillary 257 (H) 70 - 99 mg/dL      Comment: Glucose reference range applies only to samples taken after fasting for at least 8 hours.  Glucose, capillary     Status: Abnormal    Collection Time: 01/04/21  9:52 PM  Result Value Ref Range    Glucose-Capillary 132 (H) 70 - 99 mg/dL      Comment: Glucose reference range applies only to samples taken after fasting for at least 8 hours.  Triglycerides     Status: None    Collection Time: 01/05/21  5:55 AM  Result Value Ref Range    Triglycerides 73 <150 mg/dL      Comment: Performed at [facility] [address], [place], [place] [address]  Glucose, capillary     Status: Abnormal    Collection Time: 01/05/21  8:03 AM  Result Value Ref Range    Glucose-Capillary 125 (H) 70 - 99 mg/dL      Comment: Glucose reference range applies only to samples taken after fasting for at least 8 hours.       Imaging Results (Last 48 hours)  IR INTRAVSC STENT CERV CAROTID W/EMB-PROT MOD SED   Result Date: 01/04/2021 INDICATION: Sahra Sankey is a 87 year old female with past medical history of anxiety, CAD, DM, HLD, HTN, presented to [facility] ED with numbness in the left hand for the past 10 days. MR Brain showed patchy R frontoparietal lobe infarcts including the perirorlandic region. CTA Head and Neck was showed high-grade stenosis of the R ICA. She comes to our service today for a diagnostic cerebral angiogram and possible right ICA angioplasty and stenting. EXAM: ULTRASOUND-GUIDED VASCULAR ACCESS DIAGNOSTIC CEREBRAL ANGIOGRAM RIGHT CAROTID STENTING WITH CEREBRAL PROTECTION DEVICE MEDICATIONS: 5000 units of heparin, 51m verapamil and 2064ms nitroglycerin intra-arterially. 2000 units of heparin IV. 200  mcg of nitroglycerin subcutaneous. ANESTHESIA/SEDATION: The procedure was performed under monitored anesthesia care (MAC). FLUOROSCOPY TIME:  Fluoroscopy Time: 45 minutes 48 seconds (1,294 mGy). COMPLICATIONS: None immediate. TECHNIQUE: Informed written consent was obtained from the patient's grandson after a thorough discussion of the procedural risks, benefits and alternatives. All questions were addressed. Maximal Sterile Barrier Technique was utilized including caps, mask, sterile gowns, sterile gloves, sterile drape, hand hygiene and skin antiseptic. A timeout was performed prior to the initiation of the procedure. PROCEDURE: Using the modified Seldinger technique and a micropuncture kit, access was gained to the distal right radial artery at the anatomical snuffbox and a 7 French sheath was placed. Real-time ultrasound guidance was utilized for vascular access including the acquisition of a permanent ultrasound image documenting patency of the accessed vessel. Slow intra arterial infusion of 5,000 IU heparin, 5 mg Verapamil and 20616cg nitroglicerin diluted in patient's own blood was performed. No significant fluctuation in patient's blood pressure seen. Then, a right radial artery roadmap was obtained via sheath side port. Normal brachial artery branching pattern seen. No significant anatomical variation. The right radial artery caliber is adequate for vascular access. Next, a 5 Fr[place] 2 glide catheter was navigated over a 0.035" Terumo Glidewire into the right subclavian artery under fluoroscopic guidance. Frontal angiogram was obtained all followed by frontal and lateral views of the head. The catheter was then navigated into the aortic arch and the tip was reformed in the descending aorta. The catheter was then advanced into the left common carotid artery. Frontal and lateral angiograms of the neck were obtained. Following, frontal, lateral, magnified left anterior oblique and magnified lateral  views of the head were obtained. The catheter was then placed into the right vertebral artery. Frontal and lateral views of the head were obtained. The catheter was placed into the right common carotid artery. Frontal and lateral views of the neck were obtained followed by frontal and lateral views of the head. Attempted to advance an exchange length wire through the [facility] glide catheter proved unsuccessful due to catheter retraction. The catheter was then advanced over the wire into the aortic arch and exchanged over the wire for a 5 FrPakistanerenstein 2 catheter.  The Berenstein 2 catheter was then advanced into the right common carotid artery. Frontal and lateral angiograms of the neck were obtained. An exchange length wire was then placed into the distal right external carotid artery. The Berenstein catheter then was exchanged over the wire and under biplane roadmap for a 6 [place] benchmark guide catheter which was placed in the distal right common carotid artery. Frontal and lateral angiograms of the neck were obtained in used as roadmap. Then, a 4-7 mm Emboshield NAV 6 cerebral protection device was navigated and deployed into the distal cervical segment of the right ICA. Subsequently, a 2.5 x 15 mm apex balloon was navigated into the right carotid bulb, at the level of stenosis. Angioplasty was performed under fluoroscopy. The balloon was then removed. Next, a 6 x 22 mm carotid wall stent was deployed across the right carotid bifurcation. Then, a 5 x 30 mm Viatrac balloon was navigated into the recently deployed stent and in stent angioplasty was performed under fluoroscopy. Patient developed transient reactive bradycardia which rapidly improved with balloon deflation. The balloon was removed and the cerebral protection device was subsequently recaptured. Right common carotid artery angiograms with frontal and lateral views of the neck showed appropriate stent position across the right carotid bifurcation  with complete resolution of stenosis and excellent anterograde flow. Right internal carotid artery angiograms with frontal and lateral views of the head showed no evidence of thromboembolic complication. Delay right common carotid artery angiograms with frontal and lateral views of the neck showed no evidence of in stent clot formation. Flat panel CT of the head was obtained and post processed in a separate workstation with concurrent attending physician supervision. Selected images were sent to PACS. No evidence of hemorrhagic complication. The catheter was subsequently withdrawn. An inflatable band was placed and inflated over the right hand access site. The vascular sheath was withdrawn and the band was slowly deflated until brisk flow was noted through the arteriotomy site. At this point, the band was reinflated with additional 2 cc of air to obtain patent hemostasis. FINDINGS: 1. Luminal irregularity of the right subclavian artery consistent with atherosclerotic disease without hemodynamically significant stenosis. There is increased tortuosity of the V1 segment of the right vertebral artery without hemodynamically significant stenosis. 2. Atherosclerotic changes of the left carotid bulb without hemodynamically significant stenosis. 3. Atherosclerotic changes of the intracranial left ICA more pronounced at the supraclinoid segment where there is approximately 65% stenosis. 4. 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 5. Luminal irregularities are seen along the basilar, bilateral PCA and SCA branches, consistent with intracranial atherosclerotic disease without hemodynamically significant stenosis. 6. Prominent atherosclerotic changes in the right carotid bifurcation resulting in approximately 90% stenosis at the origin of the right ICA. 7. Atherosclerotic changes of the intracranial right ICA with approximately 50% stenosis at the cavernous segment. 8.  Prominent infundibular dilatation (4 mm) versus aneurysm noted at the origin of the right posterior communicating artery. 9. Post stenting angiograms showed complete resolution of the cervical right ICA stenosis. IMPRESSION: 1. Successful and uncomplicated diagnostic cerebral angiogram followed by cervical right ICA angioplasty and stenting with complete resolution of stenosis. 2. A 65% stenosis at the supraclinoid segment of the left ICA. This can be treated with angioplasty and stenting showed become symptomatic. 3. A 50% stenosis at the cavernous segment of the right ICA. 4. 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 Given patient's age, conservative management is recommended. PLAN: Patient is to remain on dual anti-platelet therapy with aspirin and Plavix for 3 months when stent patency will be evaluated with carotid ultrasound. Electronically Signed   By: Pedro Earls M.D.   On: 01/04/2021 15:07   IR US Guide Vasc Access Right   Result Date: 01/04/2021 INDICATION: Jo Larson is a 87 year old female with past medical history of anxiety, CAD, DM, HLD, HTN, presented to [facility] ED with numbness in the left hand for the past 10 days. MR Brain showed patchy R frontoparietal lobe infarcts including the perirorlandic region. CTA Head and Neck was showed high-grade stenosis of the R ICA. She comes to our service today for a diagnostic cerebral angiogram and possible right ICA angioplasty and stenting. EXAM: ULTRASOUND-GUIDED VASCULAR ACCESS DIAGNOSTIC CEREBRAL ANGIOGRAM RIGHT CAROTID STENTING WITH CEREBRAL PROTECTION DEVICE MEDICATIONS: 5000 units of heparin, 32m verapamil and 2036ms nitroglycerin intra-arterially. 2000 units of heparin IV. 200 mcg of nitroglycerin subcutaneous. ANESTHESIA/SEDATION: The procedure was performed under monitored anesthesia care (MAC). FLUOROSCOPY TIME:   Fluoroscopy Time: 45 minutes 48 seconds (1,294 mGy). COMPLICATIONS: None immediate. TECHNIQUE: Informed written consent was obtained from the patient's grandson after a thorough discussion of the procedural risks, benefits and alternatives. All questions were addressed. Maximal Sterile Barrier Technique was utilized including caps, mask, sterile gowns, sterile gloves, sterile drape, hand hygiene and skin antiseptic. A timeout was performed prior to the initiation of the procedure. PROCEDURE: Using the modified Seldinger technique and a micropuncture kit, access was gained to the distal right radial artery at the anatomical snuffbox and a 7 French sheath was placed. Real-time ultrasound guidance was utilized for vascular access including the acquisition of a permanent ultrasound image documenting patency of the accessed vessel. Slow intra arterial infusion of 5,000 IU heparin, 5 mg Verapamil and 20527cg nitroglicerin diluted in patient's own blood was performed. No significant fluctuation in patient's blood pressure seen. Then, a right radial artery roadmap was obtained via sheath side port. Normal brachial artery branching pattern seen. No significant anatomical variation. The right radial artery caliber is adequate for vascular access. Next, a 5 Fr[place]ons 2 glide catheter was navigated over a 0.035" Terumo Glidewire into the right subclavian artery under fluoroscopic guidance. Frontal angiogram was obtained all followed by frontal and lateral views of the head. The catheter was then navigated into the aortic arch and the tip was reformed in the descending aorta. The catheter was then advanced into the left common carotid artery. Frontal and lateral angiograms of the neck were obtained. Following, frontal, lateral, magnified left anterior oblique and magnified lateral views of the head were obtained. The catheter was then placed into the right vertebral artery. Frontal and lateral views of the head were  obtained. The catheter was placed into the right common carotid artery. Frontal and lateral views of the neck were obtained followed by frontal and lateral views of the head. Attempted to advance an exchange length wire [facility] the [facility] proved unsuccessful due to catheter retraction. The catheter was then advanced over the wire into the aortic arch and exchanged over the wire for a 5 FrPakistanerenstein 2 catheter. The Berenstein 2 catheter was then advanced into the right common carotid artery. Frontal and lateral angiograms of the neck were obtained. An exchange length wire was then placed into the distal right external carotid artery. The Berenstein catheter then was exchanged over the  wire and under biplane roadmap for a 6 [place] benchmark guide catheter which was placed in the distal right common carotid artery. Frontal and lateral angiograms of the neck were obtained in used as roadmap. Then, a 4-7 mm Emboshield NAV 6 cerebral protection device was navigated and deployed into the distal cervical segment of the right ICA. Subsequently, a 2.5 x 15 mm apex balloon was navigated into the right carotid bulb, at the level of stenosis. Angioplasty was performed under fluoroscopy. The balloon was then removed. Next, a 6 x 22 mm carotid wall stent was deployed across the right carotid bifurcation. Then, a 5 x 30 mm Viatrac balloon was navigated into the recently deployed stent and in stent angioplasty was performed under fluoroscopy. Patient developed transient reactive bradycardia which rapidly improved with balloon deflation. The balloon was removed and the cerebral protection device was subsequently recaptured. Right common carotid artery angiograms with frontal and lateral views of the neck showed appropriate stent position across the right carotid bifurcation with complete resolution of stenosis and excellent anterograde flow. Right internal carotid artery angiograms with frontal and lateral  views of the head showed no evidence of thromboembolic complication. Delay right common carotid artery angiograms with frontal and lateral views of the neck showed no evidence of in stent clot formation. Flat panel CT of the head was obtained and post processed in a separate workstation with concurrent attending physician supervision. Selected images were sent to PACS. No evidence of hemorrhagic complication. The catheter was subsequently withdrawn. An inflatable band was placed and inflated over the right hand access site. The vascular sheath was withdrawn and the band was slowly deflated until brisk flow was noted through the arteriotomy site. At this point, the band was reinflated with additional 2 cc of air to obtain patent hemostasis. FINDINGS: 1. Luminal irregularity of the right subclavian artery consistent with atherosclerotic disease without hemodynamically significant stenosis. There is increased tortuosity of the V1 segment of the right vertebral artery without hemodynamically significant stenosis. 2. Atherosclerotic changes of the left carotid bulb without hemodynamically significant stenosis. 3. Atherosclerotic changes of the intracranial left ICA more pronounced at the supraclinoid segment where there is approximately 65% stenosis. 4. 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 5. Luminal irregularities are seen along the basilar, bilateral PCA and SCA branches, consistent with intracranial atherosclerotic disease without hemodynamically significant stenosis. 6. Prominent atherosclerotic changes in the right carotid bifurcation resulting in approximately 90% stenosis at the origin of the right ICA. 7. Atherosclerotic changes of the intracranial right ICA with approximately 50% stenosis at the cavernous segment. 8. Prominent infundibular dilatation (4 mm) versus aneurysm noted at the origin of the right posterior communicating artery. 9. Post stenting  angiograms showed complete resolution of the cervical right ICA stenosis. IMPRESSION: 1. Successful and uncomplicated diagnostic cerebral angiogram followed by cervical right ICA angioplasty and stenting with complete resolution of stenosis. 2. A 65% stenosis at the supraclinoid segment of the left ICA. This can be treated with angioplasty and stenting showed become symptomatic. 3. A 50% stenosis at the cavernous segment of the right ICA. 4. 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 Given patient's age, conservative management is recommended. PLAN: Patient is to remain on dual anti-platelet therapy with aspirin and Plavix for 3 months when stent patency will be evaluated with carotid ultrasound. Electronically Signed   By:  Pedro Earls M.D.   On: 01/04/2021 15:07   IR ANGIO INTRA EXTRACRAN SEL COM CAROTID INNOMINATE BILAT MOD SED   Result Date: 01/04/2021 INDICATION: Jo Larson is a 87 year old female with past medical history of anxiety, CAD, DM, HLD, HTN, presented to [facility] ED with numbness in the left hand for the past 10 days. MR Brain showed patchy R frontoparietal lobe infarcts including the perirorlandic region. CTA Head and Neck was showed high-grade stenosis of the R ICA. She comes to our service today for a diagnostic cerebral angiogram and possible right ICA angioplasty and stenting. EXAM: ULTRASOUND-GUIDED VASCULAR ACCESS DIAGNOSTIC CEREBRAL ANGIOGRAM RIGHT CAROTID STENTING WITH CEREBRAL PROTECTION DEVICE MEDICATIONS: 5000 units of heparin, 38m verapamil and 2022ms nitroglycerin intra-arterially. 2000 units of heparin IV. 200 mcg of nitroglycerin subcutaneous. ANESTHESIA/SEDATION: The procedure was performed under monitored anesthesia care (MAC). FLUOROSCOPY TIME:  Fluoroscopy Time: 45 minutes 48 seconds (1,294 mGy). COMPLICATIONS: None immediate. TECHNIQUE: Informed  written consent was obtained from the patient's grandson after a thorough discussion of the procedural risks, benefits and alternatives. All questions were addressed. Maximal Sterile Barrier Technique was utilized including caps, mask, sterile gowns, sterile gloves, sterile drape, hand hygiene and skin antiseptic. A timeout was performed prior to the initiation of the procedure. PROCEDURE: Using the modified Seldinger technique and a micropuncture kit, access was gained to the distal right radial artery at the anatomical snuffbox and a 7 French sheath was placed. Real-time ultrasound guidance was utilized for vascular access including the acquisition of a permanent ultrasound image documenting patency of the accessed vessel. Slow intra arterial infusion of 5,000 IU heparin, 5 mg Verapamil and 20081cg nitroglicerin diluted in patient's own blood was performed. No significant fluctuation in patient's blood pressure seen. Then, a right radial artery roadmap was obtained via sheath side port. Normal brachial artery branching pattern seen. No significant anatomical variation. The right radial artery caliber is adequate for vascular access. Next, a 5 Fr[place] 2 glide catheter was navigated over a 0.035" Terumo Glidewire into the right subclavian artery under fluoroscopic guidance. Frontal angiogram was obtained all followed by frontal and lateral views of the head. The catheter was then navigated into the aortic arch and the tip was reformed in the descending aorta. The catheter was then advanced into the left common carotid artery. Frontal and lateral angiograms of the neck were obtained. Following, frontal, lateral, magnified left anterior oblique and magnified lateral views of the head were obtained. The catheter was then placed into the right vertebral artery. Frontal and lateral views of the head were obtained. The catheter was placed into the right common carotid artery. Frontal and lateral views of the neck  were obtained followed by frontal and lateral views of the head. Attempted to advance an exchange length wire through the [facility] glide catheter proved unsuccessful due to catheter retraction. The catheter was then advanced over the wire into the aortic arch and exchanged over the wire for a 5 FrPakistanerenstein 2 catheter. The Berenstein 2 catheter was then advanced into the right common carotid artery. Frontal and lateral angiograms of the neck were obtained. An exchange length wire was then placed into the distal right external carotid artery. The Berenstein catheter then was exchanged over the wire and under biplane roadmap for a 6 Fr[place] guide catheter which was placed in the distal right common carotid artery. Frontal and lateral angiograms of the neck were obtained in used as roadmap. Then, a 4-7 mm  Emboshield NAV 6 cerebral protection device was navigated and deployed into the distal cervical segment of the right ICA. Subsequently, a 2.5 x 15 mm apex balloon was navigated into the right carotid bulb, at the level of stenosis. Angioplasty was performed under fluoroscopy. The balloon was then removed. Next, a 6 x 22 mm carotid wall stent was deployed across the right carotid bifurcation. Then, a 5 x 30 mm Viatrac balloon was navigated into the recently deployed stent and in stent angioplasty was performed under fluoroscopy. Patient developed transient reactive bradycardia which rapidly improved with balloon deflation. The balloon was removed and the cerebral protection device was subsequently recaptured. Right common carotid artery angiograms with frontal and lateral views of the neck showed appropriate stent position across the right carotid bifurcation with complete resolution of stenosis and excellent anterograde flow. Right internal carotid artery angiograms with frontal and lateral views of the head showed no evidence of thromboembolic complication. Delay right common carotid artery angiograms  with frontal and lateral views of the neck showed no evidence of in stent clot formation. Flat panel CT of the head was obtained and post processed in a separate workstation with concurrent attending physician supervision. Selected images were sent to PACS. No evidence of hemorrhagic complication. The catheter was subsequently withdrawn. An inflatable band was placed and inflated over the right hand access site. The vascular sheath was withdrawn and the band was slowly deflated until brisk flow was noted through the arteriotomy site. At this point, the band was reinflated with additional 2 cc of air to obtain patent hemostasis. FINDINGS: 1. Luminal irregularity of the right subclavian artery consistent with atherosclerotic disease without hemodynamically significant stenosis. There is increased tortuosity of the V1 segment of the right vertebral artery without hemodynamically significant stenosis. 2. Atherosclerotic changes of the left carotid bulb without hemodynamically significant stenosis. 3. Atherosclerotic changes of the intracranial left ICA more pronounced at the supraclinoid segment where there is approximately 65% stenosis. 4. 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 5. Luminal irregularities are seen along the basilar, bilateral PCA and SCA branches, consistent with intracranial atherosclerotic disease without hemodynamically significant stenosis. 6. Prominent atherosclerotic changes in the right carotid bifurcation resulting in approximately 90% stenosis at the origin of the right ICA. 7. Atherosclerotic changes of the intracranial right ICA with approximately 50% stenosis at the cavernous segment. 8. Prominent infundibular dilatation (4 mm) versus aneurysm noted at the origin of the right posterior communicating artery. 9. Post stenting angiograms showed complete resolution of the cervical right ICA stenosis. IMPRESSION: 1. Successful and uncomplicated  diagnostic cerebral angiogram followed by cervical right ICA angioplasty and stenting with complete resolution of stenosis. 2. A 65% stenosis at the supraclinoid segment of the left ICA. This can be treated with angioplasty and stenting showed become symptomatic. 3. A 50% stenosis at the cavernous segment of the right ICA. 4. 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 Given patient's age, conservative management is recommended. PLAN: Patient is to remain on dual anti-platelet therapy with aspirin and Plavix for 3 months when stent patency will be evaluated with carotid ultrasound. Electronically Signed   By: Pedro Earls M.D.   On: 01/04/2021 15:07   IR ANGIO VERTEBRAL SEL VERTEBRAL UNI R MOD SED   Result Date: 01/04/2021 INDICATION: Jo Larson is a 87 year old female with past medical  history of anxiety, CAD, DM, HLD, HTN, presented to [facility] ED with numbness in the left hand for the past 10 days. MR Brain showed patchy R frontoparietal lobe infarcts including the perirorlandic region. CTA Head and Neck was showed high-grade stenosis of the R ICA. She comes to our service today for a diagnostic cerebral angiogram and possible right ICA angioplasty and stenting. EXAM: ULTRASOUND-GUIDED VASCULAR ACCESS DIAGNOSTIC CEREBRAL ANGIOGRAM RIGHT CAROTID STENTING WITH CEREBRAL PROTECTION DEVICE MEDICATIONS: 5000 units of heparin, 68m verapamil and 209ms nitroglycerin intra-arterially. 2000 units of heparin IV. 200 mcg of nitroglycerin subcutaneous. ANESTHESIA/SEDATION: The procedure was performed under monitored anesthesia care (MAC). FLUOROSCOPY TIME:  Fluoroscopy Time: 45 minutes 48 seconds (1,294 mGy). COMPLICATIONS: None immediate. TECHNIQUE: Informed written consent was obtained from the patient's grandson after a thorough discussion of the procedural risks, benefits and alternatives.  All questions were addressed. Maximal Sterile Barrier Technique was utilized including caps, mask, sterile gowns, sterile gloves, sterile drape, hand hygiene and skin antiseptic. A timeout was performed prior to the initiation of the procedure. PROCEDURE: Using the modified Seldinger technique and a micropuncture kit, access was gained to the distal right radial artery at the anatomical snuffbox and a 7 French sheath was placed. Real-time ultrasound guidance was utilized for vascular access including the acquisition of a permanent ultrasound image documenting patency of the accessed vessel. Slow intra arterial infusion of 5,000 IU heparin, 5 mg Verapamil and 20283cg nitroglicerin diluted in patient's own blood was performed. No significant fluctuation in patient's blood pressure seen. Then, a right radial artery roadmap was obtained via sheath side port. Normal brachial artery branching pattern seen. No significant anatomical variation. The right radial artery caliber is adequate for vascular access. Next, a 5 Fr[place]immons 2 glide catheter was navigated over a 0.035" Terumo Glidewire into the right subclavian artery under fluoroscopic guidance. Frontal angiogram was obtained all followed by frontal and lateral views of the head. The catheter was then navigated into the aortic arch and the tip was reformed in the descending aorta. The catheter was then advanced into the left common carotid artery. Frontal and lateral angiograms of the neck were obtained. Following, frontal, lateral, magnified left anterior oblique and magnified lateral views of the head were obtained. The catheter was then placed into the right vertebral artery. Frontal and lateral views of the head were obtained. The catheter was placed into the right common carotid artery. Frontal and lateral views of the neck were obtained followed by frontal and lateral views of the head. Attempted to advance an exchange length wire through the [facility] glide  catheter proved unsuccessful due to catheter retraction. The catheter was then advanced over the wire into the aortic arch and exchanged over the wire for a 5 FrPakistanerenstein 2 catheter. The Berenstein 2 catheter was then advanced into the right common carotid artery. Frontal and lateral angiograms of the neck were obtained. An exchange length wire was then placed into the distal right external carotid artery. The Berenstein catheter then was exchanged over the wire and under biplane roadmap for a 6 Fr[place]enchmark guide catheter which was placed in the distal right common carotid artery. Frontal and lateral angiograms of the neck were obtained in used as roadmap. Then, a 4-7 mm Emboshield NAV 6 cerebral protection device was navigated and deployed into the distal cervical segment of the right ICA. Subsequently, a 2.5 x 15 mm apex balloon was navigated into the right carotid bulb, at the level of stenosis. Angioplasty was  performed under fluoroscopy. The balloon was then removed. Next, a 6 x 22 mm carotid wall stent was deployed across the right carotid bifurcation. Then, a 5 x 30 mm Viatrac balloon was navigated into the recently deployed stent and in stent angioplasty was performed under fluoroscopy. Patient developed transient reactive bradycardia which rapidly improved with balloon deflation. The balloon was removed and the cerebral protection device was subsequently recaptured. Right common carotid artery angiograms with frontal and lateral views of the neck showed appropriate stent position across the right carotid bifurcation with complete resolution of stenosis and excellent anterograde flow. Right internal carotid artery angiograms with frontal and lateral views of the head showed no evidence of thromboembolic complication. Delay right common carotid artery angiograms with frontal and lateral views of the neck showed no evidence of in stent clot formation. Flat panel CT of the head was obtained and post  processed in a separate workstation with concurrent attending physician supervision. Selected images were sent to PACS. No evidence of hemorrhagic complication. The catheter was subsequently withdrawn. An inflatable band was placed and inflated over the right hand access site. The vascular sheath was withdrawn and the band was slowly deflated until brisk flow was noted through the arteriotomy site. At this point, the band was reinflated with additional 2 cc of air to obtain patent hemostasis. FINDINGS: 1. Luminal irregularity of the right subclavian artery consistent with atherosclerotic disease without hemodynamically significant stenosis. There is increased tortuosity of the V1 segment of the right vertebral artery without hemodynamically significant stenosis. 2. Atherosclerotic changes of the left carotid bulb without hemodynamically significant stenosis. 3. Atherosclerotic changes of the intracranial left ICA more pronounced at the supraclinoid segment where there is approximately 65% stenosis. 4. 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 5. Luminal irregularities are seen along the basilar, bilateral PCA and SCA branches, consistent with intracranial atherosclerotic disease without hemodynamically significant stenosis. 6. Prominent atherosclerotic changes in the right carotid bifurcation resulting in approximately 90% stenosis at the origin of the right ICA. 7. Atherosclerotic changes of the intracranial right ICA with approximately 50% stenosis at the cavernous segment. 8. Prominent infundibular dilatation (4 mm) versus aneurysm noted at the origin of the right posterior communicating artery. 9. Post stenting angiograms showed complete resolution of the cervical right ICA stenosis. IMPRESSION: 1. Successful and uncomplicated diagnostic cerebral angiogram followed by cervical right ICA angioplasty and stenting with complete resolution of stenosis. 2. A 65%  stenosis at the supraclinoid segment of the left ICA. This can be treated with angioplasty and stenting showed become symptomatic. 3. A 50% stenosis at the cavernous segment of the right ICA. 4. 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 Given patient's age, conservative management is recommended. PLAN: Patient is to remain on dual anti-platelet therapy with aspirin and Plavix for 3 months when stent patency will be evaluated with carotid ultrasound. Electronically Signed   By: Pedro Earls M.D.   On: 01/04/2021 15:07             Medical Problem List and Plan: 1.  L hemiparesis and visual deficits secondary to R frontoparietal infarcts/patchy infarcts             -patient may  shower             -ELOS/Goals: 14-21 days- Supervision to min A after exam 2.  Antithrombotics: -DVT/anticoagulation:  Pharmaceutical: Lovenox             -antiplatelet therapy: 3. Headaches/Pain Management: Will schedule tylenol qid to help with HA/Pain 4. Mood: LCSW to follow for evaluation and support. Team to provide ego support.              -antipsychotic agents: N/a 5. Neuropsych: This patient is possibly not capable of making decisions on her own behalf. 6. Skin/Wound Care: . Routine pressure relief measures.  7. Fluids/Electrolytes/Nutrition: Monitor I/O. Check lytes in am 8. HTN: Monitor BP tid--continue Norvasc, catapres and cozaar.             --will check orthostatic vitals as reporting dizziness with transitional movements 9. T2DM: Hgb A1c-. 8.8 and poorly controlled. Continue dose of levermir tonight --Will resume amaryl today and metformin tomorrow (48 hours post dye) --Will continue to monitor BS ac/hs and use SSI for elevated BS. 10. Hypoxia: Educated on pulmonary hygeine. --Question fluid overload--Grade 1 DD w/mild AVR/sclerosis.               --CXR ordered with daily  weights for monitoring. 11.  Dyslipidemia: 12.  H/o Depression: Continue Zoloft. 18. Hypothyroid: Managed with supplementation.  79. Pt doesn't speak [place]- from [place]- daughter and grandson speak [place], PA-C 01/05/2021    I have personally performed a face to face diagnostic evaluation of this patient and formulated the key components of the plan.  Additionally, I have personally reviewed laboratory data, imaging studies, as well as relevant notes and concur with the physician assistant's documentation above.

## 2021-01-05 NOTE — TOC Transition Note (Signed)
Transition of Care [facility]) - CM/SW Discharge Note   Patient Details  Name: Jo Larson MRN: [id number] Date of Birth: 1931/10/08  Transition of [facility] CM/SW Contact:  Glennon Mac, RN Phone Number: 01/05/2021, 11:00am   Clinical Narrative:   This 87 y.o. female admitted with 1 day history of Lt UE weakness, and impaired balance.  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 S/p diagnostic cerebral angiogram and R carotid stenting 6/14.  PTA, pt lives with her daughter in law, 2 grandsons, and grand daughter who provide 24 hour assist.   PT/OT recommending CIR, and pt medically stable for dc to CIR today; insurance authorization has been obtained. Plan dc to CIR when bed is available.    Final next level of care: IP Rehab Facility Barriers to Discharge: Barriers Resolved   Patient Goals and CMS Choice Patient states their goals for this hospitalization and ongoing recovery are:: to go home   Choice offered to / list presented to : Adult Children  Discharge Placement                       Discharge Plan and Services   Discharge Planning Services: CM Consult Post Acute Care Choice: IP Rehab                               Social Determinants of Health (SDOH) Interventions     Readmission Risk Interventions No flowsheet data found.  Quintella Baton, RN, BSN  Trauma/Neuro ICU Case Manager [phone]

## 2021-01-05 NOTE — Progress Notes (Signed)
Patient discharged to CIR. No personal belongings at bedside, confirmed with daughter in-law, who stated she had brought everything home and confirmed that patient does not use dentures and did not have any in hospital.

## 2021-01-05 NOTE — Progress Notes (Signed)
Updated grandson De Nurse) that patient will transfer to [phone] shortly. He verbalized he would update his mom.

## 2021-01-05 NOTE — H&P (Signed)
Physical Medicine and Rehabilitation Admission H&P    Chief Complaint  Patient presents with   Functional deficits due to stroke    Left sided weakness with sensory deficits and visual deficits.     HPI:  Jo Larson is an 87 year old female with history of CAD, T2DM, HTN, depression/anxiety who was admitted on 12/30/20 with progressive LUE weakness X few days. MRI/MRA brain done revealing patchy acute infarcts in right frontoparietal lobes and minimal right occipital lobe involvement and no intracranial stenosis.  2 D echo done revealing EF 60-65% with moderate concentric LVH, aortic sclerosis with mild AVR. Neurology recommended DAPT X 3 weeks followed by ASA alone. CTA head/neck ordered for work up on 06/13 and showed 80% stenosis in right carotid bulb and 1-2 mm outpouching from left M1/MCA segment at origin lenticulostriate artery s/o small aneurysm v/s infundibulum.    She was evaluated by Dr. Janifer Adie and underwent diagnostic cerebral angiogram with right carotid stenting on 06/14. Post procedure CT without evidence of bleed. Post procedure to continue DAPT X 3 months (will need carotid ultrasound for follow up).  She continues to be limited by left sided weakness with sensory deficits, visual deficits, cognitive deficits with delay in processing and possibly motor planning. CIR recommended due to functional decline.   Pt was completely asleep the entire time I attempted to evaluate her- she opened her eyes for a few seconds, and then closed again.  Per nurse, doesn't have any skin issues- has sacral foam due to prevention- was "c/o of being sore all over after sitting up in chair 2 hours"- gave her cold cloth on forehead and heating pad per pt request.    Review of Systems  Constitutional:  Negative for chills and fever.  HENT:  Positive for hearing loss.   Eyes:  Positive for blurred vision (problems with vision before--now more blurry).  Respiratory:  Positive for  shortness of breath ("it comes and goes") and stridor.   Gastrointestinal:  Negative for abdominal pain.  Musculoskeletal:  Positive for falls (05/2020 with radius Fx) and myalgias.  Skin:  Negative for itching and rash.  Neurological:  Positive for dizziness (with activity), sensory change, weakness and headaches (Pain all the time--chronic but worse since stroke.).  All other systems reviewed and are negative.   Past Medical History:  Diagnosis Date   Abnormal chest x-ray    Angioedema    a. 05/2014 - felt to be 2/2 clindamycin therapy.   Anxiety    CAD (coronary artery disease)    a. 07/2014 NSTEMI/PCI: LM nl, LAD min irregs, LCX 42m(4.0x18 Xience DES), LPDA 90 (271mvessel->med Rx), RCA nl, EF 60%.   Diabetes mellitus without complication ([facility]   Hyperlipemia    Hypertension    Hypothyroidism     Past Surgical History:  Procedure Laterality Date   INTUBATION NASOTRACHEAL N/A 04/03/2013   Procedure: FIBEROPTIC NASAL-TRACHEAL INTUBATION;  Surgeon: DwMelida QuitterMD;  Location: [facility];  Service: ENT;  Laterality: N/A;   IR ANGIO INTRA EXTRACRAN SEL COM CAROTID INNOMINATE BILAT MOD SED  01/03/2021   IR ANGIO VERTEBRAL SEL VERTEBRAL UNI R MOD SED  01/03/2021   IR INTRAVSC STENT CERV CAROTID W/EMB-PROT MOD SED INCL ANGIO  01/03/2021   IR [place] VASC ACCESS RIGHT  01/03/2021   LEFT HEART CATHETERIZATION WITH CORONARY ANGIOGRAM N/A 07/26/2014   Procedure: LEFT HEART CATHETERIZATION WITH CORONARY ANGIOGRAM;  Surgeon: JoLorretta HarpMD;  [facility];  Service: Cardiovascular;  Laterality: N/A;   RADIOLOGY WITH ANESTHESIA Right 01/03/2021   Procedure: IR WITH ANESTHESIA - CAROTID STENT;  Surgeon: Pedro Earls, MD;  Location: [place];  Service: Radiology;  Laterality: Right;    Family History  Problem Relation Age of Onset   Hypertension Father    Diabetes Father     Social History:  Lives with family-daughter in law/grandsons. Per  reports that she has never  smoked. She has never used smokeless tobacco. She reports that she does not drink alcohol and does not use drugs.   Allergies  Allergen Reactions   Clindamycin/Lincomycin Swelling    Tongue swelling, angioedema   Peanut Butter Flavor Shortness Of Breath    Swelling and airway closes   Peanuts [Peanut Oil] Shortness Of Breath    Swelling and airway closes     Medications Prior to Admission  Medication Sig Dispense Refill   acetaminophen (TYLENOL) 325 MG tablet Take 325 mg by mouth in the morning and at bedtime.     amLODipine (NORVASC) 10 MG tablet Take 1 tablet by mouth daily.     atorvastatin (LIPITOR) 20 MG tablet Take 1 tablet by mouth daily.     Cholecalciferol 25 MCG (1000 UT) capsule Take 1,000 Units by mouth daily.     cloNIDine (CATAPRES) 0.1 MG tablet Take 1 tablet by mouth 2 (two) times daily.     esomeprazole (NEXIUM) 40 MG capsule Take 1 capsule by mouth daily with breakfast.     glimepiride (AMARYL) 2 MG tablet Take 2 mg by mouth daily.     levothyroxine (SYNTHROID) 88 MCG tablet Take 88 mcg by mouth daily.     losartan (COZAAR) 100 MG tablet Take 1 tablet (100 mg total) by mouth daily. 60 tablet 0   metFORMIN (GLUCOPHAGE-XR) 500 MG 24 hr tablet Take 1,500 mg by mouth daily with breakfast.     metoprolol tartrate (LOPRESSOR) 25 MG tablet Take 12.5 mg by mouth 2 (two) times daily.     sertraline (ZOLOFT) 100 MG tablet Take 1 tablet by mouth daily.     amLODipine (NORVASC) 5 MG tablet Take 1 tablet (5 mg total) by mouth daily. 90 tablet 3   azithromycin (ZITHROMAX) 250 MG tablet Take 1 tablet (250 mg total) by mouth daily. Take first 2 tablets together, then 1 every day until finished. (Patient not taking: Reported on 12/30/2020) 6 tablet 0   diphenhydrAMINE (BENADRYL) 25 MG tablet Take 1 tablet (25 mg total) by mouth every 6 (six) hours as needed for allergies. (Patient not taking: Reported on 12/30/2020) 20 tablet 0   famotidine (PEPCID) 20 MG tablet Take 1 tablet (20 mg  total) by mouth 2 (two) times daily. (Patient not taking: Reported on 12/30/2020) 30 tablet 0   LORazepam (ATIVAN) 1 MG tablet Take 1 tablet (1 mg total) by mouth 3 (three) times daily as needed for anxiety. (Patient not taking: Reported on 12/30/2020) 15 tablet 0   predniSONE (DELTASONE) 20 MG tablet 2 tabs po daily x 3 days (Patient not taking: Reported on 12/30/2020) 6 tablet 0    Drug Regimen Review  Drug regimen was reviewed and remains appropriate with no significant issues identified  Home: Home Living Family/patient expects to be discharged to:: Private residence Living Arrangements: Other relatives Available Help at Discharge: Family, Available 24 hours/day Type of Home: House Home Access: Stairs to enter [employer] of Steps: 4-5 Home Layout: One level Bathroom Shower/Tub: Chiropodist: [place]  Equipment: None Additional Comments: Pt lives with her daughter in law, 2 grandsons, and grand daughter who provide 6 hour assist  Lives With:  (family)   Functional History: Prior Function Level of Independence: Needs assistance Gait / Transfers Assistance Needed: Daughter in law report pt ambulated without AE, and has no history of falls ADL's / Homemaking Assistance Needed: Daughter in law reports pt was mod I with all ADLs except showering  Functional Status:  Mobility: Bed Mobility Overal bed mobility: Needs Assistance Bed Mobility: Supine to Sit Supine to sit: Mod assist, HOB elevated Sit to supine: Min assist General bed mobility comments: Extra time and modA to ascend trunk, with pt initiating moving legs off L EOB. Transfers Overall transfer level: Needs assistance Equipment used: 2 person hand held assist, Rolling walker (2 wheeled) Transfers: Sit to/from Stand, Stand Pivot Transfers Sit to Stand: Min assist, +2 physical assistance Stand pivot transfers: Min assist, +2 physical assistance, +2 safety/equipment General transfer  comment: Sit <> stand from EOB 1x to RW and 2x from EOB to bil HHA and 1x from recliner to bil HHA, minAx2 to power up and steady. Pt unable to grasp RW efficiently with L UE, thus transitioned to supporting L UE and providing R HHA. During 1 transfer pt displayed a posterior lean with her toes coming off the ground, despite cues to correct. Pt quick to sit spontaneously. MinAx2 to stand step to L to recliner. Ambulation/Gait Ambulation/Gait assistance: Min assist, +2 physical assistance Gait Distance (Feet): 3 Feet Assistive device: 2 person hand held assist Gait Pattern/deviations: Step-to pattern, Decreased stride length, Decreased step length - left, Decreased stance time - left, Decreased weight shift to left, Wide base of support General Gait Details: Pt was able to ambulate with bil HHA.  left LE with decr step length but pt was able to clear foot.  Pt with short steps to L to transfer to recliner, with intent to ambulate further but pt easily fatiguing and spontaneously sitting this date. Additional standing bout cued pt to march in place. Gait velocity: decreased Gait velocity interpretation: <1.31 ft/sec, indicative of household ambulator    ADL: ADL Overall ADL's : Needs assistance/impaired Eating/Feeding: Modified independent, Bed level Grooming: Wash/dry face, Sitting, Minimal assistance Grooming Details (indicate cue type and reason): EOB Upper Body Bathing: Minimal assistance, Sitting Lower Body Bathing: Moderate assistance, Sit to/from stand Upper Body Dressing : Moderate assistance, Sitting Lower Body Dressing: Moderate assistance, Sit to/from stand Lower Body Dressing Details (indicate cue type and reason): mod A to don socks.  Requires therapist to start the socks over her toes, then pt able to take over Toilet Transfer: Minimal assistance, +2 for safety/equipment, +2 for physical assistance, Ambulation, Regular Toilet Toilet Transfer Details (indicate cue type and reason):  2 person HHA, grasp too weak for RW currently Toileting- Clothing Manipulation and Hygiene: Maximal assistance, Sit to/from stand Functional mobility during ADLs: Minimal assistance, +2 for physical assistance, +2 for safety/equipment  Cognition: Cognition Overall Cognitive Status: Within Functional Limits for tasks assessed Arousal/Alertness: Awake/alert Orientation Level: Oriented X4 Attention: Sustained Sustained Attention: Appears intact (during discourse) Memory:  (will assess further- recalled tx earlier but not details) Awareness: Impaired Awareness Impairment: Anticipatory impairment, Emergent impairment Problem Solving: Impaired Problem Solving Impairment: Functional basic Safety/Judgment: Impaired Cognition Arousal/Alertness: Awake/alert Behavior During Therapy: WFL for tasks assessed/performed Overall Cognitive Status: Within Functional Limits for tasks assessed General Comments: Ipad interpreter used Difficult to assess due to: Non-English speaking   Blood pressure (!) 131/42, pulse  61, temperature 100.1 F (37.8 C), resp. rate (!) 21, height 5' (1.524 m), weight 74.4 kg, SpO2 98 %. Physical Exam Vitals and nursing note reviewed.  Constitutional:      Appearance: She is obese.     Comments: Pt is elderly woman laying supine in bed- didn't wake except for a few seconds to tactile and verbal stimuli; NAD; wearing O2 per [place] 2L  HENT:     Head: Normocephalic.     Comments: Trace L facial droop at rest- but also turned slightly to R, so looks like B/L facial droop? Not likely. Couldn't assess tongue Cold washcloth on forehead    Right Ear: External ear normal.     Left Ear: External ear normal.     Nose: Nose normal. No congestion.     Comments: O2 by [place] in place- nares a little dry    Mouth/Throat:     Mouth: Mucous membranes are dry.     Pharynx: Oropharynx is clear.     Comments: sleeping with mouth open slightly-  Eyes:     Comments: eyes closed the entire  time.   Cardiovascular:     Rate and Rhythm: Normal rate and regular rhythm.     Heart sounds: Normal heart sounds. No murmur heard.   No gallop.     Comments: RRR- rate in 60s- no JVD seen Pulmonary:     Comments: CTA B/L- no W/R/R- good air movement  Abdominal:     Comments: Soft, NT, ND, (+)BS     Musculoskeletal:     Cervical back: Normal range of motion and neck supple.     Comments: Spontaneous movement (due to tactile stimuli) seen of B/L feet/ankles as well as L hand- appeared less than expected in LUE- attempted to pull LUE away from me, so biceps at least 3/5 based on exam   Skin:    Comments: Sacral foam in place- per nurse, for prevention  L hand IV- pressure dressing on L wrist (nurse says it's old)  Neurological:     Mental Status: She is easily aroused. She is lethargic.     Comments: Sleepy and tended to keep eyes closed. Flat affect with soft, slurred speech--edentulous with left facial weakness. Question bilateral visual field deficits. Left hemiplegia with sensory deficits and question apraxia v/s inattention. Not able to keep eyes open, so sleepy/sedated.    Psychiatric:     Comments: lethargic    Results for orders placed or performed during the hospital encounter of 12/30/20 (from the past 48 hour(s))  Glucose, capillary     Status: Abnormal   Collection Time: 01/03/21 11:48 AM  Result Value Ref Range   Glucose-Capillary 170 (H) 70 - 99 mg/dL    Comment: Glucose reference range applies only to samples taken after fasting for at least 8 hours.  POCT Activated clotting time     Status: None   Collection Time: 01/03/21  2:15 PM  Result Value Ref Range   Activated Clotting Time 208 seconds  POCT Activated clotting time     Status: None   Collection Time: 01/03/21  2:34 PM  Result Value Ref Range   Activated Clotting Time 202 seconds  POCT Activated clotting time     Status: None   Collection Time: 01/03/21  3:13 PM  Result Value Ref Range   Activated  Clotting Time 202 seconds  Glucose, capillary     Status: Abnormal   Collection Time: 01/03/21  4:05 PM  Result Value  Ref Range   Glucose-Capillary 105 (H) 70 - 99 mg/dL    Comment: Glucose reference range applies only to samples taken after fasting for at least 8 hours.  Glucose, capillary     Status: Abnormal   Collection Time: 01/03/21  8:18 PM  Result Value Ref Range   Glucose-Capillary 144 (H) 70 - 99 mg/dL    Comment: Glucose reference range applies only to samples taken after fasting for at least 8 hours.  Glucose, capillary     Status: Abnormal   Collection Time: 01/03/21 11:04 PM  Result Value Ref Range   Glucose-Capillary 203 (H) 70 - 99 mg/dL    Comment: Glucose reference range applies only to samples taken after fasting for at least 8 hours.  MRSA Next Gen by PCR, Nasal     Status: None   Collection Time: 01/03/21 11:05 PM   Specimen: Nasal Mucosa; Nasal Swab  Result Value Ref Range   MRSA by PCR Next Gen NOT DETECTED NOT DETECTED    Comment: (NOTE) The GeneXpert MRSA Assay (FDA approved for NASAL specimens only), is one component of a comprehensive MRSA colonization surveillance program. It is not intended to diagnose MRSA infection nor to guide or monitor treatment for MRSA infections. Test performance is not FDA approved in patients less than 73 years old. Performed at [facility], [place] [address], [place], [place] [address]   Glucose, capillary     Status: Abnormal   Collection Time: 01/04/21  7:39 AM  Result Value Ref Range   Glucose-Capillary 330 (H) 70 - 99 mg/dL    Comment: Glucose reference range applies only to samples taken after fasting for at least 8 hours.  Glucose, capillary     Status: Abnormal   Collection Time: 01/04/21 11:54 AM  Result Value Ref Range   Glucose-Capillary 305 (H) 70 - 99 mg/dL    Comment: Glucose reference range applies only to samples taken after fasting for at least 8 hours.  Glucose, capillary     Status: Abnormal    Collection Time: 01/04/21  6:31 PM  Result Value Ref Range   Glucose-Capillary 257 (H) 70 - 99 mg/dL    Comment: Glucose reference range applies only to samples taken after fasting for at least 8 hours.  Glucose, capillary     Status: Abnormal   Collection Time: 01/04/21  9:52 PM  Result Value Ref Range   Glucose-Capillary 132 (H) 70 - 99 mg/dL    Comment: Glucose reference range applies only to samples taken after fasting for at least 8 hours.  Triglycerides     Status: None   Collection Time: 01/05/21  5:55 AM  Result Value Ref Range   Triglycerides 73 <150 mg/dL    Comment: Performed at [facility] [address] [place], [place] [address]  Glucose, capillary     Status: Abnormal   Collection Time: 01/05/21  8:03 AM  Result Value Ref Range   Glucose-Capillary 125 (H) 70 - 99 mg/dL    Comment: Glucose reference range applies only to samples taken after fasting for at least 8 hours.   IR INTRAVSC STENT CERV CAROTID W/EMB-PROT MOD SED  Result Date: 01/04/2021 INDICATION: Cambri Biskup is a 87 year old female with past medical history of anxiety, CAD, DM, HLD, HTN, presented to [facility] ED with numbness in the left hand for the past 10 days. MR Brain showed patchy R frontoparietal lobe infarcts including the perirorlandic region. CTA Head and Neck was  showed high-grade stenosis of the R ICA. She comes to our service today for a diagnostic cerebral angiogram and possible right ICA angioplasty and stenting. EXAM: ULTRASOUND-GUIDED VASCULAR ACCESS DIAGNOSTIC CEREBRAL ANGIOGRAM RIGHT CAROTID STENTING WITH CEREBRAL PROTECTION DEVICE MEDICATIONS: 5000 units of heparin, 3m verapamil and 2025ms nitroglycerin intra-arterially. 2000 units of heparin IV. 200 mcg of nitroglycerin subcutaneous. ANESTHESIA/SEDATION: The procedure was performed under monitored anesthesia care (MAC). FLUOROSCOPY TIME:  Fluoroscopy Time: 45 minutes 48 seconds (1,294 mGy). COMPLICATIONS: None immediate. TECHNIQUE: Informed  written consent was obtained from the patient's grandson after a thorough discussion of the procedural risks, benefits and alternatives. All questions were addressed. Maximal Sterile Barrier Technique was utilized including caps, mask, sterile gowns, sterile gloves, sterile drape, hand hygiene and skin antiseptic. A timeout was performed prior to the initiation of the procedure. PROCEDURE: Using the modified Seldinger technique and a micropuncture kit, access was gained to the distal right radial artery at the anatomical snuffbox and a 7 French sheath was placed. Real-time ultrasound guidance was utilized for vascular access including the acquisition of a permanent ultrasound image documenting patency of the accessed vessel. Slow intra arterial infusion of 5,000 IU heparin, 5 mg Verapamil and 20734cg nitroglicerin diluted in patient's own blood was performed. No significant fluctuation in patient's blood pressure seen. Then, a right radial artery roadmap was obtained via sheath side port. Normal brachial artery branching pattern seen. No significant anatomical variation. The right radial artery caliber is adequate for vascular access. Next, a 5 FrPak[place]immons 2 glide catheter was navigated over a 0.035" Terumo Glidewire into the right subclavian artery under fluoroscopic guidance. Frontal angiogram was obtained all followed by frontal and lateral views of the head. The catheter was then navigated into the aortic arch and the tip was reformed in the descending aorta. The catheter was then advanced into the left common carotid artery. Frontal and lateral angiograms of the neck were obtained. Following, frontal, lateral, magnified left anterior oblique and magnified lateral views of the head were obtained. The catheter was then placed into the right vertebral artery. Frontal and lateral views of the head were obtained. The catheter was placed into the right common carotid artery. Frontal and lateral views of the neck  were obtained followed by frontal and lateral views of the head. Attempted to advance an exchange length wire through the [facility] glide catheter proved unsuccessful due to catheter retraction. The catheter was then advanced over the wire into the aortic arch and exchanged over the wire for a 5 FrPakistanerenstein 2 catheter. The Berenstein 2 catheter was then advanced into the right common carotid artery. Frontal and lateral angiograms of the neck were obtained. An exchange length wire was then placed into the distal right external carotid artery. The Berenstein catheter then was exchanged over the wire and under biplane roadmap for a 6 FrPakistanenchmark guide catheter which was placed in the distal right common carotid artery. Frontal and lateral angiograms of the neck were obtained in used as roadmap. Then, a 4-7 mm Emboshield NAV 6 cerebral protection device was navigated and deployed into the distal cervical segment of the right ICA. Subsequently, a 2.5 x 15 mm apex balloon was navigated into the right carotid bulb, at the level of stenosis. Angioplasty was performed under fluoroscopy. The balloon was then removed. Next, a 6 x 22 mm carotid wall stent was deployed across the right carotid bifurcation. Then, a 5 x 30 mm Viatrac balloon was navigated into the recently deployed stent  and in stent angioplasty was performed under fluoroscopy. Patient developed transient reactive bradycardia which rapidly improved with balloon deflation. The balloon was removed and the cerebral protection device was subsequently recaptured. Right common carotid artery angiograms with frontal and lateral views of the neck showed appropriate stent position across the right carotid bifurcation with complete resolution of stenosis and excellent anterograde flow. Right internal carotid artery angiograms with frontal and lateral views of the head showed no evidence of thromboembolic complication. Delay right common carotid artery angiograms  with frontal and lateral views of the neck showed no evidence of in stent clot formation. Flat panel CT of the head was obtained and post processed in a separate workstation with concurrent attending physician supervision. Selected images were sent to PACS. No evidence of hemorrhagic complication. The catheter was subsequently withdrawn. An inflatable band was placed and inflated over the right hand access site. The vascular sheath was withdrawn and the band was slowly deflated until brisk flow was noted through the arteriotomy site. At this point, the band was reinflated with additional 2 cc of air to obtain patent hemostasis. FINDINGS: 1. Luminal irregularity of the right subclavian artery consistent with atherosclerotic disease without hemodynamically significant stenosis. There is increased tortuosity of the V1 segment of the right vertebral artery without hemodynamically significant stenosis. 2. Atherosclerotic changes of the left carotid bulb without hemodynamically significant stenosis. 3. Atherosclerotic changes of the intracranial left ICA more pronounced at the supraclinoid segment where there is approximately 65% stenosis. 4. 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 5. Luminal irregularities are seen along the basilar, bilateral PCA and SCA branches, consistent with intracranial atherosclerotic disease without hemodynamically significant stenosis. 6. Prominent atherosclerotic changes in the right carotid bifurcation resulting in approximately 90% stenosis at the origin of the right ICA. 7. Atherosclerotic changes of the intracranial right ICA with approximately 50% stenosis at the cavernous segment. 8. Prominent infundibular dilatation (4 mm) versus aneurysm noted at the origin of the right posterior communicating artery. 9. Post stenting angiograms showed complete resolution of the cervical right ICA stenosis. IMPRESSION: 1. Successful and uncomplicated  diagnostic cerebral angiogram followed by cervical right ICA angioplasty and stenting with complete resolution of stenosis. 2. A 65% stenosis at the supraclinoid segment of the left ICA. This can be treated with angioplasty and stenting showed become symptomatic. 3. A 50% stenosis at the cavernous segment of the right ICA. 4. 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 Given patient's age, conservative management is recommended. PLAN: Patient is to remain on dual anti-platelet therapy with aspirin and Plavix for 3 months when stent patency will be evaluated with carotid ultrasound. Electronically Signed   By: Pedro Earls M.D.   On: 01/04/2021 15:07   IR US Guide Vasc Access Right  Result Date: 01/04/2021 INDICATION: Salsabeel Heard is a 87 year old female with past medical history of anxiety, CAD, DM, HLD, HTN, presented to [facility] ED with numbness in the left hand for the past 10 days. MR Brain showed patchy R frontoparietal lobe infarcts including the perirorlandic region. CTA Head and Neck was showed high-grade stenosis of the R ICA. She comes to our service today for a diagnostic cerebral angiogram and possible right ICA angioplasty and stenting. EXAM: ULTRASOUND-GUIDED VASCULAR ACCESS DIAGNOSTIC CEREBRAL ANGIOGRAM RIGHT CAROTID STENTING WITH CEREBRAL PROTECTION DEVICE MEDICATIONS: 5000 units of heparin, 2m  verapamil and 263mgs nitroglycerin intra-arterially. 2000 units of heparin IV. 200 mcg of nitroglycerin subcutaneous. ANESTHESIA/SEDATION: The procedure was performed under monitored anesthesia care (MAC). FLUOROSCOPY TIME:  Fluoroscopy Time: 45 minutes 48 seconds (1,294 mGy). COMPLICATIONS: None immediate. TECHNIQUE: Informed written consent was obtained from the patient's grandson after a thorough discussion of the procedural risks, benefits and alternatives. All questions were  addressed. Maximal Sterile Barrier Technique was utilized including caps, mask, sterile gowns, sterile gloves, sterile drape, hand hygiene and skin antiseptic. A timeout was performed prior to the initiation of the procedure. PROCEDURE: Using the modified Seldinger technique and a micropuncture kit, access was gained to the distal right radial artery at the anatomical snuffbox and a 7 French sheath was placed. Real-time ultrasound guidance was utilized for vascular access including the acquisition of a permanent ultrasound image documenting patency of the accessed vessel. Slow intra arterial infusion of 5,000 IU heparin, 5 mg Verapamil and 2161mcg nitroglicerin diluted in patient's own blood was performed. No significant fluctuation in patient's blood pressure seen. Then, a right radial artery roadmap was obtained via sheath side port. Normal brachial artery branching pattern seen. No significant anatomical variation. The right radial artery caliber is adequate for vascular access. Next, a 5 [place] 2 glide catheter was navigated over a 0.035" Terumo Glidewire into the right subclavian artery under fluoroscopic guidance. Frontal angiogram was obtained all followed by frontal and lateral views of the head. The catheter was then navigated into the aortic arch and the tip was reformed in the descending aorta. The catheter was then advanced into the left common carotid artery. Frontal and lateral angiograms of the neck were obtained. Following, frontal, lateral, magnified left anterior oblique and magnified lateral views of the head were obtained. The catheter was then placed into the right vertebral artery. Frontal and lateral views of the head were obtained. The catheter was placed into the right common carotid artery. Frontal and lateral views of the neck were obtained followed by frontal and lateral views of the head. Attempted to advance an exchange length wire [facility] the [facility] gl[facility] proved  unsuccessful due to catheter retraction. The catheter was then advanced over the wire into the aortic arch and exchanged over the wire for a 5 [place] 2 catheter. The Berenstein 2 catheter was then advanced into the right common carotid artery. Frontal and lateral angiograms of the neck were obtained. An exchange length wire was then placed into the distal right external carotid artery. The Berenstein catheter then was exchanged over the wire and under biplane roadmap for a 6 [place] guide catheter which was placed in the distal right common carotid artery. Frontal and lateral angiograms of the neck were obtained in used as roadmap. Then, a 4-7 mm Emboshield NAV 6 cerebral protection device was navigated and deployed into the distal cervical segment of the right ICA. Subsequently, a 2.5 x 15 mm apex balloon was navigated into the right carotid bulb, at the level of stenosis. Angioplasty was performed under fluoroscopy. The balloon was then removed. Next, a 6 x 22 mm carotid wall stent was deployed across the right carotid bifurcation. Then, a 5 x 30 mm Viatrac balloon was navigated into the recently deployed stent and in stent angioplasty was performed under fluoroscopy. Patient developed transient reactive bradycardia which rapidly improved with balloon deflation. The balloon was removed and the cerebral protection device was subsequently recaptured. Right common carotid artery angiograms with frontal and lateral views of the neck showed  appropriate stent position across the right carotid bifurcation with complete resolution of stenosis and excellent anterograde flow. Right internal carotid artery angiograms with frontal and lateral views of the head showed no evidence of thromboembolic complication. Delay right common carotid artery angiograms with frontal and lateral views of the neck showed no evidence of in stent clot formation. Flat panel CT of the head was obtained and post processed in a  separate workstation with concurrent attending physician supervision. Selected images were sent to PACS. No evidence of hemorrhagic complication. The catheter was subsequently withdrawn. An inflatable band was placed and inflated over the right hand access site. The vascular sheath was withdrawn and the band was slowly deflated until brisk flow was noted through the arteriotomy site. At this point, the band was reinflated with additional 2 cc of air to obtain patent hemostasis. FINDINGS: 1. Luminal irregularity of the right subclavian artery consistent with atherosclerotic disease without hemodynamically significant stenosis. There is increased tortuosity of the V1 segment of the right vertebral artery without hemodynamically significant stenosis. 2. Atherosclerotic changes of the left carotid bulb without hemodynamically significant stenosis. 3. Atherosclerotic changes of the intracranial left ICA more pronounced at the supraclinoid segment where there is approximately 65% stenosis. 4. 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 5. Luminal irregularities are seen along the basilar, bilateral PCA and SCA branches, consistent with intracranial atherosclerotic disease without hemodynamically significant stenosis. 6. Prominent atherosclerotic changes in the right carotid bifurcation resulting in approximately 90% stenosis at the origin of the right ICA. 7. Atherosclerotic changes of the intracranial right ICA with approximately 50% stenosis at the cavernous segment. 8. Prominent infundibular dilatation (4 mm) versus aneurysm noted at the origin of the right posterior communicating artery. 9. Post stenting angiograms showed complete resolution of the cervical right ICA stenosis. IMPRESSION: 1. Successful and uncomplicated diagnostic cerebral angiogram followed by cervical right ICA angioplasty and stenting with complete resolution of stenosis. 2. A 65% stenosis at the  supraclinoid segment of the left ICA. This can be treated with angioplasty and stenting showed become symptomatic. 3. A 50% stenosis at the cavernous segment of the right ICA. 4. 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 Given patient's age, conservative management is recommended. PLAN: Patient is to remain on dual anti-platelet therapy with aspirin and Plavix for 3 months when stent patency will be evaluated with carotid ultrasound. Electronically Signed   By: Pedro Earls M.D.   On: 01/04/2021 15:07   IR ANGIO INTRA EXTRACRAN SEL COM CAROTID INNOMINATE BILAT MOD SED  Result Date: 01/04/2021 INDICATION: Jo Larson is a 87 year old female with past medical history of anxiety, CAD, DM, HLD, HTN, presented to [facility] ED with numbness in the left hand for the past 10 days. MR Brain showed patchy R frontoparietal lobe infarcts including the perirorlandic region. CTA Head and Neck was showed high-grade stenosis of the R ICA. She comes to our service today for a diagnostic cerebral angiogram and possible right ICA angioplasty and stenting. EXAM: ULTRASOUND-GUIDED VASCULAR ACCESS DIAGNOSTIC CEREBRAL ANGIOGRAM RIGHT CAROTID STENTING WITH CEREBRAL PROTECTION DEVICE MEDICATIONS: 5000 units of heparin, 59m verapamil and 2022ms nitroglycerin intra-arterially. 2000 units of heparin IV. 200 mcg of nitroglycerin subcutaneous. ANESTHESIA/SEDATION: The procedure was performed under monitored anesthesia care (MAC). FLUOROSCOPY TIME:  Fluoroscopy Time: 45 minutes 48 seconds (1,294 mGy). COMPLICATIONS: None immediate. TECHNIQUE:  Informed written consent was obtained from the patient's grandson after a thorough discussion of the procedural risks, benefits and alternatives. All questions were addressed. Maximal Sterile Barrier Technique was utilized including caps, mask, sterile gowns, sterile gloves,  sterile drape, hand hygiene and skin antiseptic. A timeout was performed prior to the initiation of the procedure. PROCEDURE: Using the modified Seldinger technique and a micropuncture kit, access was gained to the distal right radial artery at the anatomical snuffbox and a 7 French sheath was placed. Real-time ultrasound guidance was utilized for vascular access including the acquisition of a permanent ultrasound image documenting patency of the accessed vessel. Slow intra arterial infusion of 5,000 IU heparin, 5 mg Verapamil and 939 mcg nitroglicerin diluted in patient's own blood was performed. No significant fluctuation in patient's blood pressure seen. Then, a right radial artery roadmap was obtained via sheath side port. Normal brachial artery branching pattern seen. No significant anatomical variation. The right radial artery caliber is adequate for vascular access. Next, a 5 [place] Simmons 2 glide catheter was navigated over a 0.035" Terumo Glidewire into the right subclavian artery under fluoroscopic guidance. Frontal angiogram was obtained all followed by frontal and lateral views of the head. The catheter was then navigated into the aortic arch and the tip was reformed in the descending aorta. The catheter was then advanced into the left common carotid artery. Frontal and lateral angiograms of the neck were obtained. Following, frontal, lateral, magnified left anterior oblique and magnified lateral views of the head were obtained. The catheter was then placed into the right vertebral artery. Frontal and lateral views of the head were obtained. The catheter was placed into the right common carotid artery. Frontal and lateral views of the neck were obtained followed by frontal and lateral views of the head. Attempted to advance an exchange length wire through the [facility] 2 glide catheter proved unsuccessful due to catheter retraction. The catheter was then advanced over the wire into the aortic arch and  exchanged over the wire for a 5 [place] Berenstein 2 catheter. The Berenstein 2 catheter was then advanced into the right common carotid artery. Frontal and lateral angiograms of the neck were obtained. An exchange length wire was then placed into the distal right external carotid artery. The Berenstein catheter then was exchanged over the wire and under biplane roadmap for a 6 [place] benchmark guide catheter which was placed in the distal right common carotid artery. Frontal and lateral angiograms of the neck were obtained in used as roadmap. Then, a 4-7 mm Emboshield NAV 6 cerebral protection device was navigated and deployed into the distal cervical segment of the right ICA. Subsequently, a 2.5 x 15 mm apex balloon was navigated into the right carotid bulb, at the level of stenosis. Angioplasty was performed under fluoroscopy. The balloon was then removed. Next, a 6 x 22 mm carotid wall stent was deployed across the right carotid bifurcation. Then, a 5 x 30 mm Viatrac balloon was navigated into the recently deployed stent and in stent angioplasty was performed under fluoroscopy. Patient developed transient reactive bradycardia which rapidly improved with balloon deflation. The balloon was removed and the cerebral protection device was subsequently recaptured. Right common carotid artery angiograms with frontal and lateral views of the neck showed appropriate stent position across the right carotid bifurcation with complete resolution of stenosis and excellent anterograde flow. Right internal carotid artery angiograms with frontal and lateral views of the head showed no evidence of thromboembolic complication. Delay right common carotid  artery angiograms with frontal and lateral views of the neck showed no evidence of in stent clot formation. Flat panel CT of the head was obtained and post processed in a separate workstation with concurrent attending physician supervision. Selected images were sent to PACS. No  evidence of hemorrhagic complication. The catheter was subsequently withdrawn. An inflatable band was placed and inflated over the right hand access site. The vascular sheath was withdrawn and the band was slowly deflated until brisk flow was noted through the arteriotomy site. At this point, the band was reinflated with additional 2 cc of air to obtain patent hemostasis. FINDINGS: 1. Luminal irregularity of the right subclavian artery consistent with atherosclerotic disease without hemodynamically significant stenosis. There is increased tortuosity of the V1 segment of the right vertebral artery without hemodynamically significant stenosis. 2. Atherosclerotic changes of the left carotid bulb without hemodynamically significant stenosis. 3. Atherosclerotic changes of the intracranial left ICA more pronounced at the supraclinoid segment where there is approximately 65% stenosis. 4. 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 5. Luminal irregularities are seen along the basilar, bilateral PCA and SCA branches, consistent with intracranial atherosclerotic disease without hemodynamically significant stenosis. 6. Prominent atherosclerotic changes in the right carotid bifurcation resulting in approximately 90% stenosis at the origin of the right ICA. 7. Atherosclerotic changes of the intracranial right ICA with approximately 50% stenosis at the cavernous segment. 8. Prominent infundibular dilatation (4 mm) versus aneurysm noted at the origin of the right posterior communicating artery. 9. Post stenting angiograms showed complete resolution of the cervical right ICA stenosis. IMPRESSION: 1. Successful and uncomplicated diagnostic cerebral angiogram followed by cervical right ICA angioplasty and stenting with complete resolution of stenosis. 2. A 65% stenosis at the supraclinoid segment of the left ICA. This can be treated with angioplasty and stenting showed become  symptomatic. 3. A 50% stenosis at the cavernous segment of the right ICA. 4. 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 Given patient's age, conservative management is recommended. PLAN: Patient is to remain on dual anti-platelet therapy with aspirin and Plavix for 3 months when stent patency will be evaluated with carotid ultrasound. Electronically Signed   By: Pedro Earls M.D.   On: 01/04/2021 15:07   IR ANGIO VERTEBRAL SEL VERTEBRAL UNI R MOD SED  Result Date: 01/04/2021 INDICATION: Jo Larson is a 87 year old female with past medical history of anxiety, CAD, DM, HLD, HTN, presented to [facility] ED with numbness in the left hand for the past 10 days. MR Brain showed patchy R frontoparietal lobe infarcts including the perirorlandic region. CTA Head and Neck was showed high-grade stenosis of the R ICA. She comes to our service today for a diagnostic cerebral angiogram and possible right ICA angioplasty and stenting. EXAM: ULTRASOUND-GUIDED VASCULAR ACCESS DIAGNOSTIC CEREBRAL ANGIOGRAM RIGHT CAROTID STENTING WITH CEREBRAL PROTECTION DEVICE MEDICATIONS: 5000 units of heparin, 46m verapamil and 2047ms nitroglycerin intra-arterially. 2000 units of heparin IV. 200 mcg of nitroglycerin subcutaneous. ANESTHESIA/SEDATION: The procedure was performed under monitored anesthesia care (MAC). FLUOROSCOPY TIME:  Fluoroscopy Time: 45 minutes 48 seconds (1,294 mGy). COMPLICATIONS: None immediate. TECHNIQUE: Informed written consent was obtained from the patient's grandson after a thorough discussion of the procedural risks, benefits and alternatives. All questions were addressed. Maximal Sterile Barrier Technique was utilized including caps, mask, sterile gowns, sterile gloves, sterile drape, hand hygiene and  skin antiseptic. A timeout was performed prior to the initiation of the procedure.  PROCEDURE: Using the modified Seldinger technique and a micropuncture kit, access was gained to the distal right radial artery at the anatomical snuffbox and a 7 French sheath was placed. Real-time ultrasound guidance was utilized for vascular access including the acquisition of a permanent ultrasound image documenting patency of the accessed vessel. Slow intra arterial infusion of 5,000 IU heparin, 5 mg Verapamil and 423 mcg nitroglicerin diluted in patient's own blood was performed. No significant fluctuation in patient's blood pressure seen. Then, a right radial artery roadmap was obtained via sheath side port. Normal brachial artery branching pattern seen. No significant anatomical variation. The right radial artery caliber is adequate for vascular access. Next, a 5 [place] Simmons 2 glide catheter was navigated over a 0.035" Terumo Glidewire into the right subclavian artery under fluoroscopic guidance. Frontal angiogram was obtained all followed by frontal and lateral views of the head. The catheter was then navigated into the aortic arch and the tip was reformed in the descending aorta. The catheter was then advanced into the left common carotid artery. Frontal and lateral angiograms of the neck were obtained. Following, frontal, lateral, magnified left anterior oblique and magnified lateral views of the head were obtained. The catheter was then placed into the right vertebral artery. Frontal and lateral views of the head were obtained. The catheter was placed into the right common carotid artery. Frontal and lateral views of the neck were obtained followed by frontal and lateral views of the head. Attempted to advance an exchange length wire through the [facility] 2 glide catheter proved unsuccessful due to catheter retraction. The catheter was then advanced over the wire into the aortic arch and exchanged over the wire for a 5 [place] Berenstein 2 catheter. The Berenstein 2 catheter was then advanced into the  right common carotid artery. Frontal and lateral angiograms of the neck were obtained. An exchange length wire was then placed into the distal right external carotid artery. The Berenstein catheter then was exchanged over the wire and under biplane roadmap for a 6 [place] benchmark guide catheter which was placed in the distal right common carotid artery. Frontal and lateral angiograms of the neck were obtained in used as roadmap. Then, a 4-7 mm Emboshield NAV 6 cerebral protection device was navigated and deployed into the distal cervical segment of the right ICA. Subsequently, a 2.5 x 15 mm apex balloon was navigated into the right carotid bulb, at the level of stenosis. Angioplasty was performed under fluoroscopy. The balloon was then removed. Next, a 6 x 22 mm carotid wall stent was deployed across the right carotid bifurcation. Then, a 5 x 30 mm Viatrac balloon was navigated into the recently deployed stent and in stent angioplasty was performed under fluoroscopy. Patient developed transient reactive bradycardia which rapidly improved with balloon deflation. The balloon was removed and the cerebral protection device was subsequently recaptured. Right common carotid artery angiograms with frontal and lateral views of the neck showed appropriate stent position across the right carotid bifurcation with complete resolution of stenosis and excellent anterograde flow. Right internal carotid artery angiograms with frontal and lateral views of the head showed no evidence of thromboembolic complication. Delay right common carotid artery angiograms with frontal and lateral views of the neck showed no evidence of in stent clot formation. Flat panel CT of the head was obtained and post processed in a separate workstation with concurrent attending physician supervision. Selected images were sent  to PACS. No evidence of hemorrhagic complication. The catheter was subsequently withdrawn. An inflatable band was placed and  inflated over the right hand access site. The vascular sheath was withdrawn and the band was slowly deflated until brisk flow was noted through the arteriotomy site. At this point, the band was reinflated with additional 2 cc of air to obtain patent hemostasis. FINDINGS: 1. Luminal irregularity of the right subclavian artery consistent with atherosclerotic disease without hemodynamically significant stenosis. There is increased tortuosity of the V1 segment of the right vertebral artery without hemodynamically significant stenosis. 2. Atherosclerotic changes of the left carotid bulb without hemodynamically significant stenosis. 3. Atherosclerotic changes of the intracranial left ICA more pronounced at the supraclinoid segment where there is approximately 65% stenosis. 4. 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 5. Luminal irregularities are seen along the basilar, bilateral PCA and SCA branches, consistent with intracranial atherosclerotic disease without hemodynamically significant stenosis. 6. Prominent atherosclerotic changes in the right carotid bifurcation resulting in approximately 90% stenosis at the origin of the right ICA. 7. Atherosclerotic changes of the intracranial right ICA with approximately 50% stenosis at the cavernous segment. 8. Prominent infundibular dilatation (4 mm) versus aneurysm noted at the origin of the right posterior communicating artery. 9. Post stenting angiograms showed complete resolution of the cervical right ICA stenosis. IMPRESSION: 1. Successful and uncomplicated diagnostic cerebral angiogram followed by cervical right ICA angioplasty and stenting with complete resolution of stenosis. 2. A 65% stenosis at the supraclinoid segment of the left ICA. This can be treated with angioplasty and stenting showed become symptomatic. 3. A 50% stenosis at the cavernous segment of the right ICA. 4. A 1-2 mm saccular aneurysm at the origin of a  left lenticulostriate artery and a prominent right posterior communicating artery infundibulum versus aneurysm. Given patient's age, conservative management is recommended. PLAN: Patient is to remain on dual anti-platelet therapy with aspirin and Plavix for 3 months when stent patency will be evaluated with carotid ultrasound. Electronically Signed   By: Pedro Earls M.D.   On: 01/04/2021 15:07       Medical Problem List and Plan: 1.  L hemiparesis and visual deficits secondary to R frontoparietal infarcts/patchy infarcts  -patient may  shower  -ELOS/Goals: 14-21 days- Supervision to min A after exam 2.  Antithrombotics: -DVT/anticoagulation:  Pharmaceutical: Lovenox  -antiplatelet therapy:  3. Headaches/Pain Management: Will schedule tylenol qid to help with HA/Pain 4. Mood: LCSW to follow for evaluation and support. Team to provide ego support.   -antipsychotic agents: N/a 5. Neuropsych: This patient is possibly not capable of making decisions on her own behalf. 6. Skin/Wound Care: . Routine pressure relief measures.  7. Fluids/Electrolytes/Nutrition: Monitor I/O. Check lytes in am 8. HTN: Monitor BP tid--continue Norvasc, catapres and cozaar.  --will check orthostatic vitals as reporting dizziness with transitional movements 9. T2DM: Hgb A1c-. 8.8 and poorly controlled. Continue dose of levermir tonight --Will resume amaryl today and metformin tomorrow (48 hours post dye) --Will continue to monitor BS ac/hs and use SSI for elevated BS. 10. Hypoxia: Educated on pulmonary hygeine.  --Question fluid overload--Grade 1 DD w/mild AVR/sclerosis.    --CXR ordered with daily weights for monitoring.  11.  Dyslipidemia:  12.  H/o Depression: Continue Zoloft.  52. Hypothyroid: Managed with supplementation.  59. Pt doesn't speak [place]- from [place]- daughter and grandson speak English-   Bary Leriche, PA-C 01/05/2021   I have personally  performed a face to face  diagnostic evaluation of this patient and formulated the key components of the plan.  Additionally, I have personally reviewed laboratory data, imaging studies, as well as relevant notes and concur with the physician assistant's documentation above.

## 2021-01-05 NOTE — Progress Notes (Addendum)
STROKE TEAM PROGRESS NOTE   Interval History  Patient resting in bed sleeping. She rouses to voice and participates in physical examination with the use of the [place] interpreter.   Neurological examination is stable.   Pertinent Imaging   12/30/20 CT Head  New acute or subacute infarct at the high RIGHT posterior frontal lobe. No signs of hemorrhage, mass effect or midline shift. Stable findings of atrophy and chronic microvascular ischemic change.   12/30/20 MRA Head and Neck  MRA HEAD Intracranial internal carotid arteries are patent. Middle and anterior cerebral arteries are patent. Intracranial vertebral arteries, basilar artery, posterior cerebral arteries are patent. Right posterior communicating artery is present. A small left posterior communicating artery may be present. There is no significant stenosis or aneurysm.   MRA NECK Motion artifact is present. Common, internal, and external carotid arteries are patent. Retropharyngeal course of the internal carotids. Visualized extracranial vertebral arteries are patent and codominant. There is no hemodynamically significant stenosis identified.  12/30/20 MR BRAIN WO CONTRAST Patchy acute infarcts in the right frontoparietal lobes including the perirolandic region. Minimal right occipital involvement as well (right posterior communicating artery is present). Chronic microvascular ischemic changes.  01/02/21 CTA Head and Neck W WO IV Contrast 1. Hypodense foci in the right frontoparietal region corresponding to subacute infarcts seen on prior MRI. No new focal hypodensity to suggest new infarct. 2. Atherosclerotic changes of the right carotid bifurcation resulting in 80% stenosis of the right carotid bulb. 3. Atherosclerotic changes of the left carotid bifurcation without hemodynamically significant stenosis. 4. Atherosclerotic changes of the bilateral intracranial carotid arteries with approximately 60% stenosis of the supraclinoid left  ICA and 65% stenosis of the paraclinoid right ICA. 5. Luminal irregularity of the bilateral ACA, MCA and PCA vascular trees suggestive intracranial atherosclerotic disease without hemodynamically significant stenosis. 6. A 1-2 mm outpouching from the left M1/MCA segment, at the origin of a lenticulostriate artery, suggestive of small aneurysm versus infundibulum.  PHYSICAL EXAM Constitutional: Well nourished [name] resting in bed  Resp: Unlabored respirations  Cards: RRR   Neurological Examination:  Mental Status: Sleeping but rouses to voice. Oriented to self, place, month. Not oriented to year  Speech: Fluent with repetition and naming intact  CN: EOMI, VFF, Face symmetric, Tongue midline  Motor: Normal bulk and tone. Left upper extremity drift. Decreased fine motor movements to the left arm.  Moderate weakness of left grip and left ankle dorsiflexors.   Dlt Bic Tri FgS Grp HF  KnF KnE PIF DoF  R 5 5 5 5 5 5 5 5 5 5   L 3 3 3 3 3 4 4 4 4 4   Sensory: Intact to light touch on the right. Decreased on the left upper extremity Coordination: Intact FNF Gait: Deferred   Assessment and Plan  Jo Larson is a 87 y.o. female with history of DM2, HTN, HLD, CAD, hypothyroidism who presents with left hand grip weakness with a LKW of 12/28/20.  Acute right frontoparietal lobe stroke including the perirolandic region CT head: new acute vs subacute infarct at the high right posterior frontal lobe   MRI: patchy acute infarcts in the right frontoparietal lobes including the perirolandic region. Minimal right occipital involvement  MRA: no significant stenosis in the neck CTA Head and Neck was pertinent for 80 % stenosis right carotid bulb, approximately 60% stenosis of the supraclinoid left ICA and 65% stenosis of the paraclinoid right ICA. Luminal irregularity of the bilateral ACA, MCA and PCA vascular trees  suggestive intracranial atherosclerotic disease without hemodynamically significant  stenosis. Neuro IR consulted on 6/13 given right carotid stenosis; she underwent arteriogram with right carotid stenting on 6/14  2D Echo EF 60-65%, moderate concentric left ventricular hypertrophy, Grade I diastolic dysfunction. LDL 75 HgbA1c 8.8  VTE prophylaxis - SCDs Diet: heart healthy  No antiplatelet prior to admission, now on aspirin 325 mg daily and clopidogrel 75 mg daily. Per IR will continue DAPT for 3 months until 3 month follow up CUS  Therapy recommendations:  CIR Disposition:  Pending  Hypertension Home meds:  Norvasc 10mg  daily, catepres 0.1mg  bid, cozaar 100mg  daily, lopressor 12.5mg  bid SBP goal post right carotid stent is 120 to 140 and 24 hours after stenting it can be liberalized further to 160  Long-term BP goal normotensive  Hyperlipidemia Home meds:  Lipitor 20mg  daily LDL 75, goal < 70 Add Lipitor 40mg  daily  Continue statin at discharge  Diabetes type II Controlled Home meds:  glimerpiride 2mg  daily, metformin 1500mg  daily HgbA1c 8.8, goal < 7.0 Primary team to manage DMII inpatient, recommend treatment with SSI while hospitalized   Other Stroke Risk Factors Advanced Age >/= 7  Obesity, Body mass index is 32.03 kg/m., BMI >/= 30 associated with increased stroke risk, recommend weight loss, diet and exercise as appropriate  Coronary artery disease: 07/2014 NSTEMI/PCL: LM nl, LAD min irregs, LCX 69m (4.0x18 Xience DES), LPDA 90 (44mm vessel->med Rx), RCA nl, EF 60%.  Other Active Problems Hypothyroidism: Synthroid daily Anxiety: zoloft  Hospital day # 6  , AGPCNP-BC Stroke Nurse Practitioner   Neurology will sign off at this time. Thank you for consulting, please reach out via Amion with any questions or concerns.   ATTENDING NOTE: I reviewed above note and agree with the assessment and plan. Pt was seen and examined.   Patient seen this morning with interpreter via iPad.  No family at bedside.  Patient initially sleeping,  however easily arousable.  Orientated to place, age and months but not to year.  Still has mild left nasolabial fold flattening and left arm drift, but neuro stable.  T-max this morning 100.1.  BP under control, off Cleviprex.  Continue aspirin Plavix and Lipitor.  PT/OT recommend CIR.  Neurology will sign off. Please call with questions. Pt will follow up with stroke clinic NP at [facility] in about 4 weeks. Thanks for the consult.   08/2014, MD PhD Stroke Neurology 01/05/2021 6:41 PM     To contact Stroke Continuity provider, please refer to 3m. After hours, contact General Neurology

## 2021-01-05 NOTE — Progress Notes (Signed)
Occupational Therapy Treatment Patient Details Name: Jo Larson MRN: [id number] DOB: 1932-04-22 Today's Date: 01/05/2021    History of present illness This 87 y.o. female admitted with 1 day history of Lt UE weakness, and impaired balance.  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 S/p diagnostic cerebral angiogram and R carotid stenting 6/14. PMH includes: angioedema, anxiety, CAD, DM.   OT comments  Ipad interpreter used throughout session. Pt making steady progress toward goals. Pt requires min A +2 with limited mobility and Max A with ADL tasks due to deficits listed below. Slow processing noted with possible sensory motor planning and visual perceptual deficits. Difficult to assess due to language barrier. Session limited this day as CIR PA evaluating pt during therapy. Excellent CIR candidate.   Follow Up Recommendations  CIR    Equipment Recommendations  None recommended by OT    Recommendations for Other Services Rehab consult    Precautions / Restrictions Precautions Precautions: Fall Precaution Comments: SBP < 160 Restrictions Weight Bearing Restrictions: No       Mobility Bed Mobility Overal bed mobility: Needs Assistance Bed Mobility: Supine to Sit     Supine to sit: HOB elevated;Mod assist          Transfers Overall transfer level: Needs assistance     Sit to Stand: Min assist;+2 physical assistance Stand pivot transfers: +2 physical assistance;Min assist (decreased safety awareness)            Balance Overall balance assessment: Needs assistance   Sitting balance-Leahy Scale: Fair       Standing balance-Leahy Scale: Poor                             ADL either performed or assessed with clinical judgement   ADL Overall ADL's : Needs assistance/impaired Eating/Feeding: Set up Eating/Feeding Details (indicate cue type and reason): family is bringing in dentures Grooming: Wash/dry  face;Sitting;Minimal assistance   Upper Body Bathing: Minimal assistance;Sitting                   Toileting- Clothing Manipulation and Hygiene: Total assistance (incontinenet) Toileting - Clothing Manipulation Details (indicate cue type and reason): pt is not calling out to use the bathroom adn has been instructin gto do so by nursing. Unsure if this is due to language barrier     Functional mobility during ADLs: +2 for physical assistance;Minimal assistance General ADL Comments: increased assistance due to pt sitting prematurely - risk for falls     Vision   Vision Assessment?: Vision impaired- to be further tested in functional context Additional Comments: Pt states vision is "foggy" and "different"wears glasses   Perception     Praxis      Cognition Arousal/Alertness: Awake/alert Behavior During Therapy: WFL for tasks assessed/performed Overall Cognitive Status: Difficult to assess                                 General Comments: slow processingl question        Exercises     Shoulder Instructions       General Comments Stratus Interpreter [id number] used during session; greater strength proximally; beginning to demonstrate isolated movements; trace grasp; not functional hand use; does not attempt to incorporate at times; most likely sensory motor impairment    Pertinent Vitals/ Pain       Pain Assessment:  No/denies pain  Home Living                                          Prior Functioning/Environment              Frequency  Min 2X/week        Progress Toward Goals  OT Goals(current goals can now be found in the care plan section)  Progress towards OT goals: Progressing toward goals  Acute Rehab OT Goals Patient Stated Goal: to get better OT Goal Formulation: With patient Time For Goal Achievement: 01/14/21 Potential to Achieve Goals: Good ADL Goals Pt Will Perform Grooming: with min guard  assist;standing Pt Will Perform Upper Body Bathing: with supervision;with set-up;sitting Pt Will Perform Lower Body Bathing: with min assist;sit to/from stand Pt Will Perform Upper Body Dressing: with set-up;with supervision;sitting Pt Will Perform Lower Body Dressing: with min assist;sit to/from stand Pt Will Transfer to Toilet: with min guard assist;ambulating;grab bars;regular height toilet Pt Will Perform Toileting - Clothing Manipulation and hygiene: with min assist;sit to/from stand Additional ADL Goal #1: Pt will be able to reach for and retrieve cylindrical object with Lt UE and min facilitation.  Plan Discharge plan remains appropriate    Co-evaluation    PT/OT/SLP Co-Evaluation/Treatment: Yes (partial session)     OT goals addressed during session: ADL's and self-care;Strengthening/ROM      AM-PAC OT "6 Clicks" Daily Activity     Outcome Measure   Help from another person eating meals?: A Little Help from another person taking care of personal grooming?: A Little Help from another person toileting, which includes using toliet, bedpan, or urinal?: A Lot Help from another person bathing (including washing, rinsing, drying)?: A Lot Help from another person to put on and taking off regular upper body clothing?: A Lot Help from another person to put on and taking off regular lower body clothing?: A Lot 6 Click Score: 14    End of Session    OT Visit Diagnosis: Unsteadiness on feet (R26.81);Hemiplegia and hemiparesis;Muscle weakness (generalized) (M62.81);Other abnormalities of gait and mobility (R26.89) Hemiplegia - Right/Left: Right Hemiplegia - dominant/non-dominant: Dominant Hemiplegia - caused by: Cerebral infarction   Activity Tolerance     Patient Left     Nurse Communication          Time: 8756-4332 OT Time Calculation (min): 41 min  Charges: OT General Charges $OT Visit: 1 Visit OT Treatments $Self Care/Home Management : 23-37 mins Luisa Dago, OT/L    Acute OT Clinical Specialist Acute Rehabilitation Services Pager [phone] Office [phone]    [facility] 01/05/2021, 10:45 AM

## 2021-01-05 NOTE — Progress Notes (Signed)
Physical Therapy Treatment Patient Details Name: Jo Larson MRN: [id number] DOB: [birth year] Today's Date: 01/05/2021    History of Present Illness This 87 y.o. female admitted with 1 day history of Lt UE weakness, and impaired balance.  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 S/p diagnostic cerebral angiogram and R carotid stenting 6/14. PMH includes: angioedema, anxiety, CAD, DM.    PT Comments    Focused session on facilitating  core and L UE and lower extremity muscular activation through standing, sitting reaching off BOS, performing transfers encouraging use of bil arms and L leg, and performing seated exercises. Pt displays particular weakness in her bil hip flexors, gluteal muscles, and L finger flexors. Will continue to follow acutely. Current recommendations remain appropriate.   Follow Up Recommendations  CIR     Equipment Recommendations  Other (comment) (TBD)    Recommendations for Other Services       Precautions / Restrictions Precautions Precautions: Fall Precaution Comments: SBP < 160 Restrictions Weight Bearing Restrictions: No    Mobility  Bed Mobility Overal bed mobility: Needs Assistance Bed Mobility: Supine to Sit     Supine to sit: HOB elevated;Mod assist     General bed mobility comments: Extra time and modA to ascend trunk, with pt initiating moving legs off L EOB.    Transfers Overall transfer level: Needs assistance Equipment used: 2 person hand held assist Transfers: Sit to/from [employer] Sit to Stand: Min assist;+2 physical assistance Stand pivot transfers: +2 physical assistance;Min assist (decreased safety awareness)       General transfer comment: Sit > stand from EOB to bil HHA and 5x from recliner, minAx2 or modAx1 to power up and steady. Pt needing cues to shift weight anteriorly and improve upright posture as she tends to place more weight in her heels. During sit <> stands  from recliner, cued pt to push up and reach back with bil UEs on armrest of chair and to place R foot anterior to L with L knee block to facilitate L UE and lower extremity activation and usage.  Ambulation/Gait Ambulation/Gait assistance: Min assist;+2 physical assistance Gait Distance (Feet): 5 Feet Assistive device: 2 person hand held assist Gait Pattern/deviations: Step-to pattern;Decreased stride length;Decreased step length - left;Decreased stance time - left;Decreased weight shift to left;Wide base of support Gait velocity: decreased Gait velocity interpretation: <1.31 ft/sec, indicative of household ambulator General Gait Details: Pt was able to ambulate with bil HHA.  left LE with decr step length but pt was able to clear foot.  Pt with short steps to L to transfer to recliner.   Stairs             Wheelchair Mobility    Modified Rankin (Stroke Patients Only) Modified Rankin (Stroke Patients Only) Pre-Morbid Rankin Score: Moderate disability Modified Rankin: Moderately severe disability     Balance Overall balance assessment: Needs assistance Sitting-balance support: Bilateral upper extremity supported;Feet supported;No upper extremity supported Sitting balance-Leahy Scale: Fair Sitting balance - Comments: Reaches mod off BOS but prefers at least 1 UE support when reaching, needs cues to lean anteriorly and up to minA at times to correct.   Standing balance support: Bilateral upper extremity supported Standing balance-Leahy Scale: Poor Standing balance comment: Requires at least one UE support and external support                            Cognition Arousal/Alertness: Awake/alert  Behavior During Therapy: WFL for tasks assessed/performed Overall Cognitive Status: Difficult to assess                                 General Comments: slow processing question      Exercises General Exercises - Lower Extremity Long Arc Quad:  Both;Strengthening;10 reps;Seated Hip Flexion/Marching: Strengthening;Both;10 reps;Seated (poor strength; compensates with use of gastrocs, not lifting feet fully off floor) Other Exercises Other Exercises: Sit <> stand 5x from recliner, cuing pt to place R foot anterior to L and push up/reach back with bil UEs, modAx1 Other Exercises: Reaching off BOS and across midline with return to upright midline with R UE and hand-over-hand with L UE, min guard-minA and cues to lean anteriorly    General Comments General comments (skin integrity, edema, etc.): [employer] Interpreter [id number] Jo Larson used during session; SpO2 to as low as 87% on RA thus donned [place] to 2 L with SpO2 >/= 92%      Pertinent Vitals/Pain Pain Assessment: No/denies pain Pain Intervention(s): Monitored during session    Home Living                      Prior Function            PT Goals (current goals can now be found in the care plan section) Acute Rehab PT Goals Patient Stated Goal: to get better PT Goal Formulation: With patient Time For Goal Achievement: 01/14/21 Potential to Achieve Goals: Good Progress towards PT goals: Progressing toward goals    Frequency    Min 4X/week      PT Plan Current plan remains appropriate    Co-evaluation PT/OT/SLP Co-Evaluation/Treatment: Yes Reason for Co-Treatment: For patient/therapist safety;To address functional/ADL transfers PT goals addressed during session: Mobility/safety with mobility;Balance OT goals addressed during session: ADL's and self-care;Strengthening/ROM      AM-PAC PT "6 Clicks" Mobility   Outcome Measure  Help needed turning from your back to your side while in a flat bed without using bedrails?: A Little Help needed moving from lying on your back to sitting on the side of a flat bed without using bedrails?: A Lot Help needed moving to and from a bed to a chair (including a wheelchair)?: A Lot Help needed standing up from a chair using your  arms (e.g., wheelchair or bedside chair)?: A Lot Help needed to walk in hospital room?: A Lot Help needed climbing 3-5 steps with a railing? : Total 6 Click Score: 12    End of Session Equipment Utilized During Treatment: Gait belt;Oxygen Activity Tolerance: Patient tolerated treatment well Patient left: in chair;with call bell/phone within reach;with chair alarm set Nurse Communication: Mobility status PT Visit Diagnosis: Unsteadiness on feet (R26.81);Muscle weakness (generalized) (M62.81);Other abnormalities of gait and mobility (R26.89);Ataxic gait (R26.0);Difficulty in walking, not elsewhere classified (R26.2);Other symptoms and signs involving the nervous system (R29.898)     Time: 9735-3299 PT Time Calculation (min) (ACUTE ONLY): 34 min  Charges:  $Neuromuscular Re-education: 8-22 mins                     Raymond Gurney, PT, DPT Acute Rehabilitation Services  Pager: [phone] Office: [phone]    Jewel Baize 01/05/2021, 11:21 AM

## 2021-01-05 NOTE — Progress Notes (Signed)
[name], MD   Physician  Nursing  PMR Pre-admission      Signed  Date of Service:  01/04/2021  8:14 AM       Related encounter: ED to Hosp-Admission (Current) from 12/30/2020 in [facility]       Signed                                                                                                                                                                                                                                                                                                                                                                                                                                                                                                                     PMR Admission Coordinator Pre-Admission Assessment   Patient: Jo Larson is an 87 y.o., female MRN: [id number] DOB: 02/19/1932 Height: 5' (152.4 cm) Weight: 74.4 kg   Insurance Information HMO: yes     PPO:      PCP:  IPA:      80/20:      OTHER: PRIMARY: [employer]      Policy#: [id number]      Subscriber: Pt CM Name:  n/a     Phone#:  [phone]    Fax#: [phone] Pre-Cert#: [id number]      Employer: I received a call from Ebony Hail from [place] on 01/04/21 with authorization for admission for 7 days, 01/04/21-01/11/21, with next update due 01/10/21 Benefits:  Phone #: opened via [email or web address]     Name: Irene Shipper Date: 07/23/2020 - still active Deductible: $0 OOP Max: $7,550 ($0 met) CIR: $1480 admission copay SNF: $0.00 Copayment per day for days 1-20; $194.50 Copayment per day for days 21-100; Maximum of 100 days/benefit period Outpatient: 100% coverage Home Health:  100% coverage, 0% co-insurance; limited by medical necessity DME: 80% coverage; 20% co-insurance Providers: in  network  SECONDARY: Medicaid      Policy#:[id number]    Phone#:   Development worker, community:       Phone#:   The Therapist, art Information Summary" for patients in Inpatient Rehabilitation Facilities with attached "Privacy Act [place] Records" was provided and verbally reviewed with: Family   Emergency Contact Information Contact Information       Name Relation Home Work Mobile    [place] Relative     [place] [phone]               Current Medical History  Patient Admitting Diagnosis: R CVA   History of Present Illness: Pt. Is an  87 y.o. female with  PMH includes: angioedema, anxiety, CAD, DM. admitted with 1 day history of Lt UE weakness, and impaired balance on 12/30/12  .  MRI of brain showed patchy acute infarcts of the Rt frontoparietal lobes.Pt. found t have R ICA stenosis and underwent cerebral angiogram and right carotid stenting with IR on 01/03/21. CIR was consulted to assist in return to PLOF.   Complete NIHSS TOTAL: 3   Patient's medical record from  [facility]  has been reviewed by the rehabilitation admission coordinator and physician.   Past Medical History      Past Medical History:  Diagnosis Date   Abnormal chest x-ray     Angioedema      a. 05/2014 - felt to be 2/2 clindamycin therapy.   Anxiety     CAD (coronary artery disease)      a. 07/2014 NSTEMI/PCI: LM nl, LAD min irregs, LCX 17m(4.0x18 Xience DES), LPDA 90 (238mvessel->med Rx), RCA nl, EF 60%.   Diabetes mellitus without complication (HCVan Meter    Hyperlipemia     Hypertension     Hypothyroidism        Family History   family history includes Diabetes in her father; Hypertension in her father.   Prior Rehab/Hospitalizations Has the patient had prior rehab or hospitalizations prior to admission? No   Has the patient had major surgery during 100 days prior to admission? Yes               Current Medications   Current Facility-Administered Medications:   0.9 %  sodium chloride infusion, , Intravenous, Continuous, XuRosalin HawkingMD, Last Rate: 50 mL/hr at 01/05/21 0700, Infusion Verify at 01/05/21 0700   acetaminophen (TYLENOL) tablet 650 mg, 650 mg, Oral, Q4H PRN, 650 mg at 01/05/21 0935 **OR** acetaminophen (TYLENOL) 160 MG/5ML solution 650 mg, 650 mg, Per  Tube, Q4H PRN **OR** acetaminophen (TYLENOL) suppository 650 mg, 650 mg, Rectal, Q4H PRN, Marylyn Ishihara, Tyrone A, DO   amLODipine (NORVASC) tablet 10 mg, 10 mg, Oral, Daily, Pokhrel, Laxman, MD, 10 mg at 01/05/21 3016   aspirin EC tablet 325 mg, 325 mg, Oral, Daily, Rosalin Hawking, MD, 325 mg at 01/05/21 0925   atorvastatin (LIPITOR) tablet 40 mg, 40 mg, Oral, Daily, Kyle, Tyrone A, DO, 40 mg at 01/05/21 0109   bisacodyl (DULCOLAX) suppository 10 mg, 10 mg, Rectal, Daily PRN, Felipa Evener, Courtney S, NP   Chlorhexidine Gluconate Cloth 2 % PADS 6 each, 6 each, Topical, Daily, de Sindy Messing, Ages, MD, 6 each at 01/04/21 0904   cloNIDine (CATAPRES) tablet 0.1 mg, 0.1 mg, Oral, BID, Pokhrel, Laxman, MD, 0.1 mg at 01/05/21 0926   clopidogrel (PLAVIX) tablet 75 mg, 75 mg, Oral, Daily, Donnetta Simpers, MD, 75 mg at 01/05/21 0926   insulin aspart (novoLOG) injection 0-15 Units, 0-15 Units, Subcutaneous, TID WC, Hosie Poisson, MD, 2 Units at 01/05/21 0900   insulin aspart (novoLOG) injection 0-5 Units, 0-5 Units, Subcutaneous, QHS, Hosie Poisson, MD, 3 Units at 01/04/21 2117   insulin detemir (LEVEMIR) injection 10 Units, 10 Units, Subcutaneous, BID, Charlynne Cousins, MD, 10 Units at 01/05/21 0925   levothyroxine (SYNTHROID) tablet 88 mcg, 88 mcg, Oral, Daily, Kyle, Tyrone A, DO, 88 mcg at 01/05/21 3235   losartan (COZAAR) tablet 100 mg, 100 mg, Oral, Daily, Pokhrel, Laxman, MD, 100 mg at 01/05/21 5732   MEDLINE mouth rinse, 15 mL, Mouth Rinse, BID, Charlynne Cousins, MD   ondansetron [facility]) injection 4 mg, 4 mg, Intravenous,  Q6H PRN, de Sindy Messing, Custar, MD, 4 mg at 01/04/21 1202   pantoprazole (PROTONIX) EC tablet 40 mg, 40 mg, Oral, Daily, Aileen Fass, Tammi Klippel, MD   polyethylene glycol (MIRALAX / GLYCOLAX) packet 17 g, 17 g, Oral, Daily, Heard, Courtney S, NP, 17 g at 01/05/21 2025   senna-docusate (Senokot-S) tablet 1 tablet, 1 tablet, Oral, Daily, Heard, Courtney S, NP, 1 tablet at 01/05/21 4270   sertraline (ZOLOFT) tablet 100 mg, 100 mg, Oral, Daily, Pokhrel, Laxman, MD, 100 mg at 01/05/21 6237   Patients Current Diet:  Diet Order                  DIET DYS 2 Room service appropriate? Yes; Fluid consistency: Thin  Diet effective now             Diet - low sodium heart healthy                         Precautions / Restrictions Precautions Precautions: Fall Precaution Comments: SBP < 160 Restrictions Weight Bearing Restrictions: No    Has the patient had 2 or more falls or a fall with injury in the past year? No   Prior Activity Level Limited Community (1-2x/wk): Pt. went out for appointments only   Prior Functional Level Self Care: Did the patient need help bathing, dressing, using the toilet or eating? Needed some help   Indoor Mobility: Did the patient need assistance with walking from room to room (with or without device)? Independent   Stairs: Did the patient need assistance with internal or external stairs (with or without device)? Needed some help   Functional Cognition: Did the patient need help planning regular tasks such as shopping or remembering to take medications? Needed some help   Home Assistive Devices / [place] Devices/Equipment: Eyvonne Mechanic (specify  quad or straight), CBG Meter (single point cane) Home Equipment: None   Prior Device Use: Indicate devices/aids used by the patient prior to current illness, exacerbation or injury? None of the above   Current Functional Level Cognition   Arousal/Alertness: Awake/alert Overall Cognitive  Status: Difficult to assess Difficult to assess due to: Non-English speaking Orientation Level: Oriented X4 General Comments: slow processingl question Attention: Sustained Sustained Attention: Appears intact (during discourse) Memory:  (will assess further- recalled tx earlier but not details) Awareness: Impaired Awareness Impairment: Anticipatory impairment, Emergent impairment Problem Solving: Impaired Problem Solving Impairment: Functional basic Safety/Judgment: Impaired    Extremity Assessment (includes Sensation/Coordination)   Upper Extremity Assessment: LUE deficits/detail LUE Deficits / Details: mild flexor synergy of Lt UE - Brunnstrom stage 5.  Pt with minimal flexion extension actively of Lt hand LUE Sensation: decreased light touch LUE Coordination: decreased gross motor, decreased fine motor  Lower Extremity Assessment: Defer to PT evaluation LLE Deficits / Details: functional assessment with patient able to ambulate with no knee buckling but fatigues quickly. Difficult to assess due to language barrier LLE Coordination: decreased gross motor     ADLs   Overall ADL's : Needs assistance/impaired Eating/Feeding: Set up Eating/Feeding Details (indicate cue type and reason): family is bringing in dentures Grooming: Wash/dry face, Sitting, Minimal assistance Grooming Details (indicate cue type and reason): EOB Upper Body Bathing: Minimal assistance, Sitting Lower Body Bathing: Moderate assistance, Sit to/from stand Upper Body Dressing : Moderate assistance, Sitting Lower Body Dressing: Moderate assistance, Sit to/from stand Lower Body Dressing Details (indicate cue type and reason): mod A to don socks.  Requires therapist to start the socks over her toes, then pt able to take over Toilet Transfer: Minimal assistance, +2 for safety/equipment, +2 for physical assistance, Ambulation, Regular Toilet Toilet Transfer Details (indicate cue type and reason): 2 person HHA, grasp  too weak for RW currently Toileting- Clothing Manipulation and Hygiene: Total assistance (incontinenet) Toileting - Clothing Manipulation Details (indicate cue type and reason): pt is not calling out to use the bathroom adn has been instructin gto do so by nursing. Unsure if this is due to language barrier Functional mobility during ADLs: +2 for physical assistance, Minimal assistance General ADL Comments: increased assistance due to pt sitting prematurely - risk for falls     Mobility   Overal bed mobility: Needs Assistance Bed Mobility: Supine to Sit Supine to sit: HOB elevated, Mod assist Sit to supine: Min assist General bed mobility comments: Extra time and modA to ascend trunk, with pt initiating moving legs off L EOB.     Transfers   Overall transfer level: Needs assistance Equipment used: 2 person hand held assist, Rolling walker (2 wheeled) Transfers: Sit to/from Stand, Stand Pivot Transfers Sit to Stand: Min assist, +2 physical assistance Stand pivot transfers: +2 physical assistance, Min assist (decreased safety awareness) General transfer comment: Sit <> stand from EOB 1x to RW and 2x from EOB to bil HHA and 1x from recliner to bil HHA, minAx2 to power up and steady. Pt unable to grasp RW efficiently with L UE, thus transitioned to supporting L UE and providing R HHA. During 1 transfer pt displayed a posterior lean with her toes coming off the ground, despite cues to correct. Pt quick to sit spontaneously. MinAx2 to stand step to L to recliner.     Ambulation / Gait / Stairs / Wheelchair Mobility   Ambulation/Gait Ambulation/Gait assistance: Min assist, +2 physical assistance Gait Distance (Feet): 3 Feet Assistive device:  2 person hand held assist Gait Pattern/deviations: Step-to pattern, Decreased stride length, Decreased step length - left, Decreased stance time - left, Decreased weight shift to left, Wide base of support General Gait Details: Pt was able to ambulate with bil  HHA.  left LE with decr step length but pt was able to clear foot.  Pt with short steps to L to transfer to recliner, with intent to ambulate further but pt easily fatiguing and spontaneously sitting this date. Additional standing bout cued pt to march in place. Gait velocity: decreased Gait velocity interpretation: <1.31 ft/sec, indicative of household ambulator     Posture / Balance Balance Overall balance assessment: Needs assistance Sitting-balance support: No upper extremity supported Sitting balance-Leahy Scale: Fair Standing balance support: Bilateral upper extremity supported Standing balance-Leahy Scale: Poor Standing balance comment: Requires at least one UE support and external support     Special needs/care consideration Diabetic management Yes, on novolog and levemir insulin in acute hospital, and Special service needs Pt. Requires [place] interpreter or that therapy be scheduled in the morning so family can assist with interpretation.    Previous Home Environment (from acute therapy documentation) Living Arrangements: Other relatives  Lives With:  (family) Available Help at Discharge: Family, Available 24 hours/day Type of Home: House Home Layout: One level Home Access: Stairs to enter Technical brewer of Steps: 4-5 Bathroom Shower/Tub: Chiropodist: [place]: No Additional Comments: Pt lives with her daughter in law, 2 grandsons, and grand daughter who provide 24 hour assist   Discharge Living Setting Plans for Discharge Living Setting: Patient's home Type of Home at Discharge: House Discharge Home Layout: One level Discharge Home Access: Stairs to enter Entrance Stairs-Rails: None Entrance Stairs-Number of Steps: 4-5 Discharge Bathroom Shower/Tub: Tub/shower unit Discharge Bathroom Toilet: Standard Discharge Bathroom Accessibility: No Does the patient have any problems obtaining your medications?: No    Social/Family/Support Systems Patient Roles: Other (Comment) Has daughter in law and grandsons Contact Information: Aurore Redinger - grandson - [phone] Anticipated Caregiver: Gonnella,Radojka (Daughter in Sports coach) Anticipated Caregiver's Contact Information: [phone] Ability/Limitations of Caregiver: Can provide min A Caregiver Availability: 24/7 (family to rotate to provide 24/7 support) Discharge Plan Discussed with Primary Caregiver: Yes Is Caregiver In Agreement with Plan?: Yes Does Caregiver/Family have Issues with Lodging/Transportation while Pt is in Rehab?: No   Goals Patient/Family Goal for Rehab: PT/OT/SLP supervision Expected length of stay: 10-12 days Cultural Considerations: [place] interpreter Pt/Family Agrees to Admission and willing to participate: Yes Program Orientation Provided & Reviewed with Pt/Caregiver Including Roles  & Responsibilities: Yes   Decrease burden of Care through IP rehab admission: Specialzed equipment needs, Decrease number of caregivers, Bowel and bladder program, and Patient/family education   Possible need for SNF placement upon discharge: not anticipated   Patient Condition: I have reviewed medical records from [facility] , spoken with CM, and patient and family member. I met with patient at the bedside and discussed via phone for inpatient rehabilitation assessment.  Patient will benefit from ongoing PT, OT, and SLP, can actively participate in 3 hours of therapy a day 5 days of the week, and can make measurable gains during the admission.  Patient will also benefit from the coordinated team approach during an Inpatient Acute Rehabilitation admission.  The patient will receive intensive therapy as well as Rehabilitation physician, nursing, social worker, and care management interventions.  Due to safety, skin/wound care, disease management, medication administration, pain management, and patient education the  patient  requires 24 hour a day rehabilitation nursing.  The patient is currently min to mod assist with mobility and basic ADLs.  Discharge setting and therapy post discharge at home with home health is anticipated.  Patient has agreed to participate in the Acute Inpatient Rehabilitation Program and will admit today.   Preadmission Screen Completed By:  Retta Diones, 01/05/2021 11:04 AM ______________________________________________________________________   Discussed status with Dr. Naaman Plummer and Dr. Dagoberto Ligas on 01/05/21 at 10:34 and received approval for admission today.   Admission Coordinator:  Retta Diones, RN, time 10;34/Date 01/05/21    Assessment/Plan: Diagnosis: Does the need for close, 24 hr/day Medical supervision in concert with the patient's rehab needs make it unreasonable for this patient to be served in a less intensive setting? Yes Co-Morbidities requiring supervision/potential complications: R brain/CVA, L hemiparesis; DM, CAD, s/p R carotid stenting Due to bladder management, bowel management, safety, skin/wound care, disease management, medication administration, pain management, and patient education, does the patient require 24 hr/day rehab nursing? Yes Does the patient require coordinated care of a physician, rehab nurse, PT, OT, and SLP to address physical and functional deficits in the context of the above medical diagnosis(es)? Yes Addressing deficits in the following areas: balance, endurance, locomotion, strength, transferring, bowel/bladder control, bathing, dressing, feeding, grooming, and toileting Can the patient actively participate in an intensive therapy program of at least 3 hrs of therapy 5 days a week? Yes The potential for patient to make measurable gains while on inpatient rehab is good Anticipated functional outcomes upon discharge from inpatient rehab: supervision PT, supervision OT, supervision SLP Estimated rehab length of stay to reach the above functional goals  is: 10-12 days Anticipated discharge destination: Home 10. Overall Rehab/Functional Prognosis: good     MD Signature:            Revision History                                                 Note Details  Jo Fireman, MD File Time 01/05/2021 11:47 AM  Author Type Physician Status Signed  Last Editor [name], MD Service Nursing

## 2021-01-05 NOTE — Discharge Summary (Signed)
Physician Discharge Summary  Jo Larson FIE:[id number] DOB: 1931-08-11 DOA: 12/30/2020  PCP: Katherina Mires, MD  Admit date: 12/30/2020 Discharge date: 01/05/2021  Admitted From: Home Disposition:  CIR  Recommendations for Outpatient Follow-up:  Follow up with PCP in 1-2 weeks Please obtain BMP/CBC in one week  Home Health:No Equipment/Devices:None  Discharge Condition:Stable CODE STATUS:Full Diet recommendation: Heart Healthy   Brief/Interim Summary: 87 y.o. female past medical history of essential hypertension, diabetes mellitus type 2 comes into the ED with left upper extremity weakness MRI of the brain showed acute patchy infarcts in the right frontotemporal lobe, MRA showed no significant stenosis.  Discharge Diagnoses:  Active Problems:   Hypothyroidism   Type 2 diabetes mellitus without complication, without long-term current use of insulin (HCC)   Hyperlipidemia   ANXIETY DISORDER, GENERALIZED   HYPERTENSION, BENIGN ESSENTIAL   Coronary artery disease involving native coronary artery of native heart without angina pectoris   CVA (cerebral vascular accident) ([place])  Right parietal CVA: MRI positive, LDL 75 she was started on statins, A1c of 8.8. She was monitored on telemetry with no events. Neurology was consulted recommended aspirin and Plavix for 3 weeks then aspirin alone. A CT angio of the head and neck was done that showed 60% stenosis of the supraclinoid left ICA. IR was consulted to perform an angiogram and stenting of the right ICA. Repeated CT scan of the head showed no hemorrhagic complications. Physical therapy was consulted and recommended IR so she will be transferred to inpatient rehab.  Essential hypertension: Currently on Norvasc clonidine and metoprolol losartan, she required Cleviprex temporarily. We will follow-up on her blood pressure as an outpatient and titrate antihypertensive medications as needed.  Diabetes mellitus type  2: Non-insulin-dependent with an A1c of 8.8.  Metformin dose was increased she will continue it as an outpatient.  Hypothyroidism: Continue Synthroid.  Anxiety: Continue current regimen.  Discharge Instructions  Discharge Instructions     Diet - low sodium heart healthy   Complete by: As directed    Increase activity slowly   Complete by: As directed       Allergies as of 01/05/2021       Reactions   Clindamycin/lincomycin Swelling   Tongue swelling, angioedema   Peanut Butter Flavor Shortness Of Breath   Swelling and airway closes   Peanuts [peanut Oil] Shortness Of Breath   Swelling and airway closes        Medication List     STOP taking these medications    acetaminophen 325 MG tablet Commonly known as: TYLENOL   amLODipine 10 MG tablet Commonly known as: NORVASC   amLODipine 5 MG tablet Commonly known as: NORVASC   azithromycin 250 MG tablet Commonly known as: ZITHROMAX   diphenhydrAMINE 25 MG tablet Commonly known as: BENADRYL   predniSONE 20 MG tablet Commonly known as: DELTASONE       TAKE these medications    aspirin 325 MG EC tablet Take 1 tablet (325 mg total) by mouth daily. Start taking on: January 06, 2021   atorvastatin 20 MG tablet Commonly known as: LIPITOR Take 1 tablet by mouth daily.   Cholecalciferol 25 MCG (1000 UT) capsule Take 1,000 Units by mouth daily.   cloNIDine 0.1 MG tablet Commonly known as: CATAPRES Take 1 tablet by mouth 2 (two) times daily.   clopidogrel 75 MG tablet Commonly known as: PLAVIX Take 1 tablet (75 mg total) by mouth daily. Start taking on: January 06, 2021   esomeprazole 40 MG  capsule Commonly known as: NEXIUM Take 1 capsule by mouth daily with breakfast.   famotidine 20 MG tablet Commonly known as: PEPCID Take 1 tablet (20 mg total) by mouth 2 (two) times daily.   glimepiride 2 MG tablet Commonly known as: AMARYL Take 2 mg by mouth daily.   levothyroxine 88 MCG tablet Commonly known  as: SYNTHROID Take 88 mcg by mouth daily.   LORazepam 1 MG tablet Commonly known as: Ativan Take 1 tablet (1 mg total) by mouth 3 (three) times daily as needed for anxiety.   losartan 100 MG tablet Commonly known as: COZAAR Take 1 tablet (100 mg total) by mouth daily.   metFORMIN 500 MG 24 hr tablet Commonly known as: GLUCOPHAGE-XR Take 1,500 mg by mouth daily with breakfast.   metoprolol tartrate 25 MG tablet Commonly known as: LOPRESSOR Take 12.5 mg by mouth 2 (two) times daily.   sertraline 100 MG tablet Commonly known as: ZOLOFT Take 1 tablet by mouth daily.        Allergies  Allergen Reactions   Clindamycin/Lincomycin Swelling    Tongue swelling, angioedema   Peanut Butter Flavor Shortness Of Breath    Swelling and airway closes   Peanuts [Peanut Oil] Shortness Of Breath    Swelling and airway closes    Consultations: Interventional radiology Neurology.   Procedures/Studies: CT ANGIO HEAD NECK W WO CM  Result Date: 01/02/2021 CLINICAL DATA:  Stroke, follow-up. EXAM: CT ANGIOGRAPHY HEAD AND NECK TECHNIQUE: Multidetector CT imaging of the head and neck was performed using the standard protocol during bolus administration of intravenous contrast. Multiplanar CT image reconstructions and MIPs were obtained to evaluate the vascular anatomy. Carotid stenosis measurements (when applicable) are obtained utilizing NASCET criteria, using the distal internal carotid diameter as the denominator. CONTRAST:  78m OMNIPAQUE IOHEXOL 350 MG/ML SOLN COMPARISON:  MRI of the brain, MRA head and neck, December 30, 2020. FINDINGS: CT HEAD FINDINGS Brain: Hypodense foci in the right frontal parietal region consistent with subacute infarcts, better demonstrated on prior MRI. No new hypodense areas to suggest new acute infarct. No hemorrhage, hydrocephalus, extra-axial collection or mass lesion. Mild parenchymal volume loss, more pronounced in the bilateral frontal lobes. Vascular: Calcified  plaques in the bilateral carotid siphons, bilateral intracranial vertebral arteries and basilar artery. Skull: Normal. Negative for fracture or focal lesion. Sinuses: Trace mucosal thickening of the ethmoid cells. Orbits: No acute finding. Review of the MIP images confirms the above findings CTA NECK FINDINGS Aortic arch: Standard branching. Imaged portion shows no evidence of aneurysm or dissection. Atherosclerotic plaques are seen in the aortic arch with ulcerated plaque at the origin of the innominate artery. No significant stenosis of the major arch vessel origins. Right carotid system: Atherosclerotic plaques along the right common carotid artery without hemodynamically significant stenosis. There is prominent atherosclerotic plaque, predominantly soft in the right carotid bulb resulting in approximately 80% stenosis. Increased tortuosity with retropharyngeal course of the cervical segment of the right ICA. Left carotid system: Atherosclerotic changes of the left common carotid artery and left carotid bifurcation without hemodynamically significant stenosis. Increased tortuosity with retropharyngeal course of the cervical right ICA. Vertebral arteries: Codominant. No evidence of dissection, stenosis (50% or greater) or occlusion. Skeleton: Degenerative changes of the cervical spine. Other neck: Negative. Upper chest: Negative. Review of the MIP images confirms the above findings CTA HEAD FINDINGS Anterior circulation: Calcified plaques in the bilateral carotid siphons. There is approximately 60% stenosis of the supraclinoid left ICA and 65% stenosis of the  paraclinoid segment of the right ICA. A 1-2 mm outpouching at the origin of the left lenticulostriate artery from the left M1 segment, suggesting small aneurysm versus infundibulum. Mild luminal irregularity in the bilateral ACA and MCA vascular trees suggestive mild atherosclerotic disease without hemodynamically significant stenosis. Posterior circulation:  Mild luminal irregularity of the basilar artery and bilateral posterior cerebral arteries suggestive of atherosclerotic disease without hemodynamically significant stenosis. Venous sinuses: As permitted by contrast timing, patent. Anatomic variants: None significant. Review of the MIP images confirms the above findings IMPRESSION: 1. Hypodense foci in the right frontoparietal region corresponding to subacute infarcts seen on prior MRI. No new focal hypodensity to suggest new infarct. 2. Atherosclerotic changes of the right carotid bifurcation resulting in 80% stenosis of the right carotid bulb. 3. Atherosclerotic changes of the left carotid bifurcation without hemodynamically significant stenosis. 4. Atherosclerotic changes of the bilateral intracranial carotid arteries with approximately 60% stenosis of the supraclinoid left ICA and 65% stenosis of the paraclinoid right ICA. 5. Luminal irregularity of the bilateral ACA, MCA and PCA vascular trees suggestive intracranial atherosclerotic disease without hemodynamically significant stenosis. 6. A 1-2 mm outpouching from the left M1/MCA segment, at the origin of a lenticulostriate artery, suggestive of small aneurysm versus infundibulum. 7. Results communicated via secure text paging to Dr. Erlinda Hong at 10:20 a.m. on 01/02/2021. Electronically Signed   By: Pedro Earls M.D.   On: 01/02/2021 10:21   CT HEAD WO CONTRAST  Result Date: 12/30/2020 CLINICAL DATA:  Numbness and weakness, difficulty ambulating at baseline. Unable to move LEFT hand since 3 a.m. EXAM: CT HEAD WITHOUT CONTRAST TECHNIQUE: Contiguous axial images were obtained from the base of the skull through the vertex without intravenous contrast. COMPARISON:  June 12, 2020. FINDINGS: Brain: New area of hypoattenuation at the gray-white junction in the RIGHT posterior frontal lobe (image 22/2) no sign of intracranial hemorrhage, mass, mass effect or midline shift. Extensive cerebral atrophy  and chronic microvascular ischemic change. Vascular: No hyperdense vessel or unexpected calcification. Skull: Normal. Negative for fracture or focal lesion. Sinuses/Orbits: Visualized paranasal sinuses and orbits are unremarkable. Other: None IMPRESSION: New acute or subacute infarct at the high RIGHT posterior frontal lobe. No signs of hemorrhage, mass effect or midline shift. Stable findings of atrophy and chronic microvascular ischemic change. Critical Value/emergent results were called by telephone at the time of interpretation on 12/30/2020 at 12:50 pm to provider CLAUDIA GIBBONS , who verbally acknowledged these results. Electronically Signed   By: Zetta Bills M.D.   On: 12/30/2020 12:47   MR ANGIO HEAD WO CONTRAST  Result Date: 12/30/2020 CLINICAL DATA:  Left hand weakness EXAM: MRI HEAD WITHOUT CONTRAST MRA HEAD WITHOUT CONTRAST MRA NECK WITHOUT CONTRAST TECHNIQUE: Multiplanar, multiecho pulse sequences of the brain and surrounding structures were obtained without intravenous contrast. Angiographic images of the Circle of Willis were obtained using MRA technique without intravenous contrast. Angiographic images of the neck were obtained using MRA technique without intravenous contrast. Carotid stenosis measurements (when applicable) are obtained utilizing NASCET criteria, using the distal internal carotid diameter as the denominator. COMPARISON:  None. FINDINGS: MRI HEAD Brain: There is patchy mildly reduced cortical/subcortical reduced diffusion in the right frontoparietal lobes including involvement of the perirolandic region. There is minimal right occipital lobe involvement as well. Prominence of the ventricles and sulci reflects generalized parenchymal volume loss. Patchy foci of T2 hyperintensity in the supratentorial and pontine white matter are nonspecific but may reflect mild to moderate chronic microvascular ischemic changes. Foci  of susceptibility in the left temporal lobe and left  cerebellum likely reflect chronic microhemorrhages. There is no intracranial mass or mass effect. There is no hydrocephalus or extra-axial fluid collection. Vascular: Major vessel flow voids at the skull base are preserved. Skull and upper cervical spine: Normal marrow signal is preserved. Sinuses/Orbits: Paranasal sinuses are aerated. Orbits are unremarkable. Other: Sella is unremarkable.  Mastoid air cells are clear. MRA HEAD Intracranial internal carotid arteries are patent. Middle and anterior cerebral arteries are patent. Intracranial vertebral arteries, basilar artery, posterior cerebral arteries are patent. Right posterior communicating artery is present. A small left posterior communicating artery may be present. There is no significant stenosis or aneurysm. MRA NECK Motion artifact is present. Common, internal, and external carotid arteries are patent. Retropharyngeal course of the internal carotids. Visualized extracranial vertebral arteries are patent and codominant. There is no hemodynamically significant stenosis identified. IMPRESSION: Patchy acute infarcts in the right frontoparietal lobes including the perirolandic region. Minimal right occipital involvement as well (right posterior communicating artery is present). Chronic microvascular ischemic changes. No proximal intracranial vessel occlusion. No hemodynamically significant stenosis in the neck. Electronically Signed   By: Macy Mis M.D.   On: 12/30/2020 17:43   MR ANGIO NECK WO CONTRAST  Result Date: 12/30/2020 CLINICAL DATA:  Left hand weakness EXAM: MRI HEAD WITHOUT CONTRAST MRA HEAD WITHOUT CONTRAST MRA NECK WITHOUT CONTRAST TECHNIQUE: Multiplanar, multiecho pulse sequences of the brain and surrounding structures were obtained without intravenous contrast. Angiographic images of the Circle of Willis were obtained using MRA technique without intravenous contrast. Angiographic images of the neck were obtained using MRA technique  without intravenous contrast. Carotid stenosis measurements (when applicable) are obtained utilizing NASCET criteria, using the distal internal carotid diameter as the denominator. COMPARISON:  None. FINDINGS: MRI HEAD Brain: There is patchy mildly reduced cortical/subcortical reduced diffusion in the right frontoparietal lobes including involvement of the perirolandic region. There is minimal right occipital lobe involvement as well. Prominence of the ventricles and sulci reflects generalized parenchymal volume loss. Patchy foci of T2 hyperintensity in the supratentorial and pontine white matter are nonspecific but may reflect mild to moderate chronic microvascular ischemic changes. Foci of susceptibility in the left temporal lobe and left cerebellum likely reflect chronic microhemorrhages. There is no intracranial mass or mass effect. There is no hydrocephalus or extra-axial fluid collection. Vascular: Major vessel flow voids at the skull base are preserved. Skull and upper cervical spine: Normal marrow signal is preserved. Sinuses/Orbits: Paranasal sinuses are aerated. Orbits are unremarkable. Other: Sella is unremarkable.  Mastoid air cells are clear. MRA HEAD Intracranial internal carotid arteries are patent. Middle and anterior cerebral arteries are patent. Intracranial vertebral arteries, basilar artery, posterior cerebral arteries are patent. Right posterior communicating artery is present. A small left posterior communicating artery may be present. There is no significant stenosis or aneurysm. MRA NECK Motion artifact is present. Common, internal, and external carotid arteries are patent. Retropharyngeal course of the internal carotids. Visualized extracranial vertebral arteries are patent and codominant. There is no hemodynamically significant stenosis identified. IMPRESSION: Patchy acute infarcts in the right frontoparietal lobes including the perirolandic region. Minimal right occipital involvement as  well (right posterior communicating artery is present). Chronic microvascular ischemic changes. No proximal intracranial vessel occlusion. No hemodynamically significant stenosis in the neck. Electronically Signed   By: Macy Mis M.D.   On: 12/30/2020 17:43   MR BRAIN WO CONTRAST  Result Date: 12/30/2020 CLINICAL DATA:  Left hand weakness EXAM: MRI HEAD WITHOUT CONTRAST  MRA HEAD WITHOUT CONTRAST MRA NECK WITHOUT CONTRAST TECHNIQUE: Multiplanar, multiecho pulse sequences of the brain and surrounding structures were obtained without intravenous contrast. Angiographic images of the Circle of Willis were obtained using MRA technique without intravenous contrast. Angiographic images of the neck were obtained using MRA technique without intravenous contrast. Carotid stenosis measurements (when applicable) are obtained utilizing NASCET criteria, using the distal internal carotid diameter as the denominator. COMPARISON:  None. FINDINGS: MRI HEAD Brain: There is patchy mildly reduced cortical/subcortical reduced diffusion in the right frontoparietal lobes including involvement of the perirolandic region. There is minimal right occipital lobe involvement as well. Prominence of the ventricles and sulci reflects generalized parenchymal volume loss. Patchy foci of T2 hyperintensity in the supratentorial and pontine white matter are nonspecific but may reflect mild to moderate chronic microvascular ischemic changes. Foci of susceptibility in the left temporal lobe and left cerebellum likely reflect chronic microhemorrhages. There is no intracranial mass or mass effect. There is no hydrocephalus or extra-axial fluid collection. Vascular: Major vessel flow voids at the skull base are preserved. Skull and upper cervical spine: Normal marrow signal is preserved. Sinuses/Orbits: Paranasal sinuses are aerated. Orbits are unremarkable. Other: Sella is unremarkable.  Mastoid air cells are clear. MRA HEAD Intracranial internal  carotid arteries are patent. Middle and anterior cerebral arteries are patent. Intracranial vertebral arteries, basilar artery, posterior cerebral arteries are patent. Right posterior communicating artery is present. A small left posterior communicating artery may be present. There is no significant stenosis or aneurysm. MRA NECK Motion artifact is present. Common, internal, and external carotid arteries are patent. Retropharyngeal course of the internal carotids. Visualized extracranial vertebral arteries are patent and codominant. There is no hemodynamically significant stenosis identified. IMPRESSION: Patchy acute infarcts in the right frontoparietal lobes including the perirolandic region. Minimal right occipital involvement as well (right posterior communicating artery is present). Chronic microvascular ischemic changes. No proximal intracranial vessel occlusion. No hemodynamically significant stenosis in the neck. Electronically Signed   By: Macy Mis M.D.   On: 12/30/2020 17:43   IR INTRAVSC STENT CERV CAROTID W/EMB-PROT MOD SED  Result Date: 01/04/2021 INDICATION: Verenise Gose is a 87 year old female with past medical history of anxiety, CAD, DM, HLD, HTN, presented to [facility] ED with numbness in the left hand for the past 10 days. MR Brain showed patchy R frontoparietal lobe infarcts including the perirorlandic region. CTA Head and Neck was showed high-grade stenosis of the R ICA. She comes to our service today for a diagnostic cerebral angiogram and possible right ICA angioplasty and stenting. EXAM: ULTRASOUND-GUIDED VASCULAR ACCESS DIAGNOSTIC CEREBRAL ANGIOGRAM RIGHT CAROTID STENTING WITH CEREBRAL PROTECTION DEVICE MEDICATIONS: 5000 units of heparin, 30m verapamil and 2058ms nitroglycerin intra-arterially. 2000 units of heparin IV. 200 mcg of nitroglycerin subcutaneous. ANESTHESIA/SEDATION: The procedure was performed under monitored anesthesia care (MAC). FLUOROSCOPY TIME:  Fluoroscopy Time: 45  minutes 48 seconds (1,294 mGy). COMPLICATIONS: None immediate. TECHNIQUE: Informed written consent was obtained from the patient's grandson after a thorough discussion of the procedural risks, benefits and alternatives. All questions were addressed. Maximal Sterile Barrier Technique was utilized including caps, mask, sterile gowns, sterile gloves, sterile drape, hand hygiene and skin antiseptic. A timeout was performed prior to the initiation of the procedure. PROCEDURE: Using the modified Seldinger technique and a micropuncture kit, access was gained to the distal right radial artery at the anatomical snuffbox and a 7 French sheath was placed. Real-time ultrasound guidance was utilized for vascular access including the acquisition of a permanent ultrasound  image documenting patency of the accessed vessel. Slow intra arterial infusion of 5,000 IU heparin, 5 mg Verapamil and 811 mcg nitroglicerin diluted in patient's own blood was performed. No significant fluctuation in patient's blood pressure seen. Then, a right radial artery roadmap was obtained via sheath side port. Normal brachial artery branching pattern seen. No significant anatomical variation. The right radial artery caliber is adequate for vascular access. Next, a 5 [place] Simmons 2 glide catheter was navigated over a 0.035" Terumo Glidewire into the right subclavian artery under fluoroscopic guidance. Frontal angiogram was obtained all followed by frontal and lateral views of the head. The catheter was then navigated into the aortic arch and the tip was reformed in the descending aorta. The catheter was then advanced into the left common carotid artery. Frontal and lateral angiograms of the neck were obtained. Following, frontal, lateral, magnified left anterior oblique and magnified lateral views of the head were obtained. The catheter was then placed into the right vertebral artery. Frontal and lateral views of the head were obtained. The catheter was  placed into the right common carotid artery. Frontal and lateral views of the neck were obtained followed by frontal and lateral views of the head. Attempted to advance an exchange length wire through the [facility] 2 glide catheter proved unsuccessful due to catheter retraction. The catheter was then advanced over the wire into the aortic arch and exchanged over the wire for a 5 [place] Berenstein 2 catheter. The Berenstein 2 catheter was then advanced into the right common carotid artery. Frontal and lateral angiograms of the neck were obtained. An exchange length wire was then placed into the distal right external carotid artery. The Berenstein catheter then was exchanged over the wire and under biplane roadmap for a 6 [place] benchmark guide catheter which was placed in the distal right common carotid artery. Frontal and lateral angiograms of the neck were obtained in used as roadmap. Then, a 4-7 mm Emboshield NAV 6 cerebral protection device was navigated and deployed into the distal cervical segment of the right ICA. Subsequently, a 2.5 x 15 mm apex balloon was navigated into the right carotid bulb, at the level of stenosis. Angioplasty was performed under fluoroscopy. The balloon was then removed. Next, a 6 x 22 mm carotid wall stent was deployed across the right carotid bifurcation. Then, a 5 x 30 mm Viatrac balloon was navigated into the recently deployed stent and in stent angioplasty was performed under fluoroscopy. Patient developed transient reactive bradycardia which rapidly improved with balloon deflation. The balloon was removed and the cerebral protection device was subsequently recaptured. Right common carotid artery angiograms with frontal and lateral views of the neck showed appropriate stent position across the right carotid bifurcation with complete resolution of stenosis and excellent anterograde flow. Right internal carotid artery angiograms with frontal and lateral views of the head showed no  evidence of thromboembolic complication. Delay right common carotid artery angiograms with frontal and lateral views of the neck showed no evidence of in stent clot formation. Flat panel CT of the head was obtained and post processed in a separate workstation with concurrent attending physician supervision. Selected images were sent to PACS. No evidence of hemorrhagic complication. The catheter was subsequently withdrawn. An inflatable band was placed and inflated over the right hand access site. The vascular sheath was withdrawn and the band was slowly deflated until brisk flow was noted through the arteriotomy site. At this point, the band was reinflated with additional 2 cc of air  to obtain patent hemostasis. FINDINGS: 1. Luminal irregularity of the right subclavian artery consistent with atherosclerotic disease without hemodynamically significant stenosis. There is increased tortuosity of the V1 segment of the right vertebral artery without hemodynamically significant stenosis. 2. Atherosclerotic changes of the left carotid bulb without hemodynamically significant stenosis. 3. Atherosclerotic changes of the intracranial left ICA more pronounced at the supraclinoid segment where there is approximately 65% stenosis. 4. 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 5. Luminal irregularities are seen along the basilar, bilateral PCA and SCA branches, consistent with intracranial atherosclerotic disease without hemodynamically significant stenosis. 6. Prominent atherosclerotic changes in the right carotid bifurcation resulting in approximately 90% stenosis at the origin of the right ICA. 7. Atherosclerotic changes of the intracranial right ICA with approximately 50% stenosis at the cavernous segment. 8. Prominent infundibular dilatation (4 mm) versus aneurysm noted at the origin of the right posterior communicating artery. 9. Post stenting angiograms showed complete  resolution of the cervical right ICA stenosis. IMPRESSION: 1. Successful and uncomplicated diagnostic cerebral angiogram followed by cervical right ICA angioplasty and stenting with complete resolution of stenosis. 2. A 65% stenosis at the supraclinoid segment of the left ICA. This can be treated with angioplasty and stenting showed become symptomatic. 3. A 50% stenosis at the cavernous segment of the right ICA. 4. 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 Given patient's age, conservative management is recommended. PLAN: Patient is to remain on dual anti-platelet therapy with aspirin and Plavix for 3 months when stent patency will be evaluated with carotid ultrasound. Electronically Signed   By: Pedro Earls M.D.   On: 01/04/2021 15:07   IR US Guide Vasc Access Right  Result Date: 01/04/2021 INDICATION: Breea Wilkowski is a 87 year old female with past medical history of anxiety, CAD, DM, HLD, HTN, presented to [facility] ED with numbness in the left hand for the past 10 days. MR Brain showed patchy R frontoparietal lobe infarcts including the perirorlandic region. CTA Head and Neck was showed high-grade stenosis of the R ICA. She comes to our service today for a diagnostic cerebral angiogram and possible right ICA angioplasty and stenting. EXAM: ULTRASOUND-GUIDED VASCULAR ACCESS DIAGNOSTIC CEREBRAL ANGIOGRAM RIGHT CAROTID STENTING WITH CEREBRAL PROTECTION DEVICE MEDICATIONS: 5000 units of heparin, 55m verapamil and 2032ms nitroglycerin intra-arterially. 2000 units of heparin IV. 200 mcg of nitroglycerin subcutaneous. ANESTHESIA/SEDATION: The procedure was performed under monitored anesthesia care (MAC). FLUOROSCOPY TIME:  Fluoroscopy Time: 45 minutes 48 seconds (1,294 mGy). COMPLICATIONS: None immediate. TECHNIQUE: Informed written consent was obtained from the patient's grandson after a  thorough discussion of the procedural risks, benefits and alternatives. All questions were addressed. Maximal Sterile Barrier Technique was utilized including caps, mask, sterile gowns, sterile gloves, sterile drape, hand hygiene and skin antiseptic. A timeout was performed prior to the initiation of the procedure. PROCEDURE: Using the modified Seldinger technique and a micropuncture kit, access was gained to the distal right radial artery at the anatomical snuffbox and a 7 French sheath was placed. Real-time ultrasound guidance was utilized for vascular access including the acquisition of a permanent ultrasound image documenting patency of the accessed vessel. Slow intra arterial infusion of 5,000 IU heparin, 5 mg Verapamil and 20073cg nitroglicerin diluted in patient's own blood was performed. No significant fluctuation in patient's blood pressure seen. Then, a right radial artery roadmap was obtained  via sheath side port. Normal brachial artery branching pattern seen. No significant anatomical variation. The right radial artery caliber is adequate for vascular access. Next, a 5 [place] Simmons 2 glide catheter was navigated over a 0.035" Terumo Glidewire into the right subclavian artery under fluoroscopic guidance. Frontal angiogram was obtained all followed by frontal and lateral views of the head. The catheter was then navigated into the aortic arch and the tip was reformed in the descending aorta. The catheter was then advanced into the left common carotid artery. Frontal and lateral angiograms of the neck were obtained. Following, frontal, lateral, magnified left anterior oblique and magnified lateral views of the head were obtained. The catheter was then placed into the right vertebral artery. Frontal and lateral views of the head were obtained. The catheter was placed into the right common carotid artery. Frontal and lateral views of the neck were obtained followed by frontal and lateral views of the head.  Attempted to advance an exchange length wire through the [facility] 2 glide catheter proved unsuccessful due to catheter retraction. The catheter was then advanced over the wire into the aortic arch and exchanged over the wire for a 5 [place] Berenstein 2 catheter. The Berenstein 2 catheter was then advanced into the right common carotid artery. Frontal and lateral angiograms of the neck were obtained. An exchange length wire was then placed into the distal right external carotid artery. The Berenstein catheter then was exchanged over the wire and under biplane roadmap for a 6 [place] benchmark guide catheter which was placed in the distal right common carotid artery. Frontal and lateral angiograms of the neck were obtained in used as roadmap. Then, a 4-7 mm Emboshield NAV 6 cerebral protection device was navigated and deployed into the distal cervical segment of the right ICA. Subsequently, a 2.5 x 15 mm apex balloon was navigated into the right carotid bulb, at the level of stenosis. Angioplasty was performed under fluoroscopy. The balloon was then removed. Next, a 6 x 22 mm carotid wall stent was deployed across the right carotid bifurcation. Then, a 5 x 30 mm Viatrac balloon was navigated into the recently deployed stent and in stent angioplasty was performed under fluoroscopy. Patient developed transient reactive bradycardia which rapidly improved with balloon deflation. The balloon was removed and the cerebral protection device was subsequently recaptured. Right common carotid artery angiograms with frontal and lateral views of the neck showed appropriate stent position across the right carotid bifurcation with complete resolution of stenosis and excellent anterograde flow. Right internal carotid artery angiograms with frontal and lateral views of the head showed no evidence of thromboembolic complication. Delay right common carotid artery angiograms with frontal and lateral views of the neck showed no evidence of  in stent clot formation. Flat panel CT of the head was obtained and post processed in a separate workstation with concurrent attending physician supervision. Selected images were sent to PACS. No evidence of hemorrhagic complication. The catheter was subsequently withdrawn. An inflatable band was placed and inflated over the right hand access site. The vascular sheath was withdrawn and the band was slowly deflated until brisk flow was noted through the arteriotomy site. At this point, the band was reinflated with additional 2 cc of air to obtain patent hemostasis. FINDINGS: 1. Luminal irregularity of the right subclavian artery consistent with atherosclerotic disease without hemodynamically significant stenosis. There is increased tortuosity of the V1 segment of the right vertebral artery without hemodynamically significant stenosis. 2. Atherosclerotic changes of the left carotid  bulb without hemodynamically significant stenosis. 3. Atherosclerotic changes of the intracranial left ICA more pronounced at the supraclinoid segment where there is approximately 65% stenosis. 4. 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 5. Luminal irregularities are seen along the basilar, bilateral PCA and SCA branches, consistent with intracranial atherosclerotic disease without hemodynamically significant stenosis. 6. Prominent atherosclerotic changes in the right carotid bifurcation resulting in approximately 90% stenosis at the origin of the right ICA. 7. Atherosclerotic changes of the intracranial right ICA with approximately 50% stenosis at the cavernous segment. 8. Prominent infundibular dilatation (4 mm) versus aneurysm noted at the origin of the right posterior communicating artery. 9. Post stenting angiograms showed complete resolution of the cervical right ICA stenosis. IMPRESSION: 1. Successful and uncomplicated diagnostic cerebral angiogram followed by cervical right ICA  angioplasty and stenting with complete resolution of stenosis. 2. A 65% stenosis at the supraclinoid segment of the left ICA. This can be treated with angioplasty and stenting showed become symptomatic. 3. A 50% stenosis at the cavernous segment of the right ICA. 4. 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 Given patient's age, conservative management is recommended. PLAN: Patient is to remain on dual anti-platelet therapy with aspirin and Plavix for 3 months when stent patency will be evaluated with carotid ultrasound. Electronically Signed   By: Pedro Earls M.D.   On: 01/04/2021 15:07   ECHOCARDIOGRAM COMPLETE  Result Date: 12/31/2020    ECHOCARDIOGRAM REPORT   Patient Name:   Jo Larson Date of Exam: 12/31/2020 Medical Rec #:  [id number]      Height:       60.0 in Accession #:    [id number]     Weight:       164.0 lb Date of Birth:  03/26/1932       BSA:          1.716 m Patient Age:    87 years       BP:           171/74 mmHg Patient Gender: F              HR:           71 bpm. Exam Location:  Inpatient Procedure: 2D Echo, Cardiac Doppler and Color Doppler Indications:    Stroke I63.9  History:        Patient has prior history of Echocardiogram examinations, most                 recent 08/06/2014.  Sonographer:    Merrie Roof RDCS Referring Phys: [id number] [place]  1. Left ventricular ejection fraction, by estimation, is 60 to 65%. The left ventricle has normal function. The left ventricle has no regional wall motion abnormalities. There is moderate concentric left ventricular hypertrophy. Left ventricular diastolic parameters are consistent with Grade I diastolic dysfunction (impaired relaxation).  2. Right ventricular systolic function is normal. The right ventricular size is normal. Mildly increased right ventricular wall thickness. Tricuspid regurgitation  signal is inadequate for assessing PA pressure.  3. Left atrial size was mildly dilated.  4. The mitral valve is normal in structure. Trivial mitral valve regurgitation.  5. The aortic valve is tricuspid. There is mild calcification of the aortic valve. There is mild thickening of the aortic valve. Aortic valve regurgitation is trivial. Mild to moderate aortic  valve sclerosis/calcification is present, without any evidence of aortic stenosis. Comparison(s): Compared to prior echo report in 2016, there is no significant change. Moderate LVH is noted on current study. Conclusion(s)/Recommendation(s): No intracardiac source of embolism detected on this transthoracic study. A transesophageal echocardiogram is recommended to exclude cardiac source of embolism if clinically indicated. FINDINGS  Left Ventricle: Left ventricular ejection fraction, by estimation, is 60 to 65%. The left ventricle has normal function. The left ventricle has no regional wall motion abnormalities. The left ventricular internal cavity size was normal in size. There is  moderate concentric left ventricular hypertrophy. Left ventricular diastolic parameters are consistent with Grade I diastolic dysfunction (impaired relaxation). Right Ventricle: The right ventricular size is normal. Mildly increased right ventricular wall thickness. Right ventricular systolic function is normal. Tricuspid regurgitation signal is inadequate for assessing PA pressure. Left Atrium: Left atrial size was mildly dilated. Right Atrium: Right atrial size was normal in size. Pericardium: There is no evidence of pericardial effusion. Mitral Valve: The mitral valve is normal in structure. There is mild thickening of the mitral valve leaflet(s). Trivial mitral valve regurgitation. Tricuspid Valve: The tricuspid valve is normal in structure. Tricuspid valve regurgitation is trivial. Aortic Valve: The aortic valve is tricuspid. There is mild calcification of the aortic valve.  There is mild thickening of the aortic valve. Aortic valve regurgitation is trivial. Mild to moderate aortic valve sclerosis/calcification is present, without any evidence of aortic stenosis. Aortic valve mean gradient measures 5.0 mmHg. Aortic valve peak gradient measures 12.5 mmHg. Aortic valve area, by VTI measures 2.22 cm. Pulmonic Valve: The pulmonic valve was normal in structure. Pulmonic valve regurgitation is trivial. Aorta: The aortic root is normal in size and structure. IAS/Shunts: No atrial level shunt detected by color flow Doppler.  LEFT VENTRICLE PLAX 2D LVIDd:         3.40 cm  Diastology LVIDs:         2.40 cm  LV e' medial:    5.77 cm/s LV PW:         1.30 cm  LV E/e' medial:  9.5 LV IVS:        1.20 cm  LV e' lateral:   8.05 cm/s LVOT diam:     1.90 cm  LV E/e' lateral: 6.8 LV SV:         69 LV SV Index:   40 LVOT Area:     2.84 cm  RIGHT VENTRICLE RV Basal diam:  3.90 cm LEFT ATRIUM             Index       RIGHT ATRIUM           Index LA diam:        3.50 cm 2.04 cm/m  RA Area:     15.20 cm LA Vol (A2C):   65.4 ml 38.12 ml/m RA Volume:   35.20 ml  20.52 ml/m LA Vol (A4C):   50.1 ml 29.20 ml/m LA Biplane Vol: 59.6 ml 34.74 ml/m  AORTIC VALVE AV Area (Vmax):    1.89 cm AV Area (Vmean):   2.33 cm AV Area (VTI):     2.22 cm AV Vmax:           177.00 cm/s AV Vmean:          104.000 cm/s AV VTI:            0.309 m AV Peak Grad:      12.5 mmHg AV Mean Grad:  5.0 mmHg LVOT Vmax:         118.00 cm/s LVOT Vmean:        85.600 cm/s LVOT VTI:          0.242 m LVOT/AV VTI ratio: 0.78  AORTA Ao Root diam: 3.20 cm MITRAL VALVE MV Area (PHT): 2.75 cm    SHUNTS MV Decel Time: 276 msec    Systemic VTI:  0.24 m MV E velocity: 54.80 cm/s  Systemic Diam: 1.90 cm MV A velocity: 80.50 cm/s MV E/A ratio:  0.68 Gwyndolyn Kaufman MD Electronically signed by Gwyndolyn Kaufman MD Signature Date/Time: 12/31/2020/7:00:28 PM    Final    IR ANGIO INTRA EXTRACRAN SEL COM CAROTID INNOMINATE BILAT MOD SED  Result  Date: 01/04/2021 INDICATION: Athaliah Piatek is a 87 year old female with past medical history of anxiety, CAD, DM, HLD, HTN, presented to [facility] ED with numbness in the left hand for the past 10 days. MR Brain showed patchy R frontoparietal lobe infarcts including the perirorlandic region. CTA Head and Neck was showed high-grade stenosis of the R ICA. She comes to our service today for a diagnostic cerebral angiogram and possible right ICA angioplasty and stenting. EXAM: ULTRASOUND-GUIDED VASCULAR ACCESS DIAGNOSTIC CEREBRAL ANGIOGRAM RIGHT CAROTID STENTING WITH CEREBRAL PROTECTION DEVICE MEDICATIONS: 5000 units of heparin, 89m verapamil and 2058ms nitroglycerin intra-arterially. 2000 units of heparin IV. 200 mcg of nitroglycerin subcutaneous. ANESTHESIA/SEDATION: The procedure was performed under monitored anesthesia care (MAC). FLUOROSCOPY TIME:  Fluoroscopy Time: 45 minutes 48 seconds (1,294 mGy). COMPLICATIONS: None immediate. TECHNIQUE: Informed written consent was obtained from the patient's grandson after a thorough discussion of the procedural risks, benefits and alternatives. All questions were addressed. Maximal Sterile Barrier Technique was utilized including caps, mask, sterile gowns, sterile gloves, sterile drape, hand hygiene and skin antiseptic. A timeout was performed prior to the initiation of the procedure. PROCEDURE: Using the modified Seldinger technique and a micropuncture kit, access was gained to the distal right radial artery at the anatomical snuffbox and a 7 French sheath was placed. Real-time ultrasound guidance was utilized for vascular access including the acquisition of a permanent ultrasound image documenting patency of the accessed vessel. Slow intra arterial infusion of 5,000 IU heparin, 5 mg Verapamil and 20774cg nitroglicerin diluted in patient's own blood was performed. No significant fluctuation in patient's blood pressure seen. Then, a right radial artery roadmap was obtained via  sheath side port. Normal brachial artery branching pattern seen. No significant anatomical variation. The right radial artery caliber is adequate for vascular access. Next, a 5 [place] 2 glide catheter was navigated over a 0.035" Terumo Glidewire into the right subclavian artery under fluoroscopic guidance. Frontal angiogram was obtained all followed by frontal and lateral views of the head. The catheter was then navigated into the aortic arch and the tip was reformed in the descending aorta. The catheter was then advanced into the left common carotid artery. Frontal and lateral angiograms of the neck were obtained. Following, frontal, lateral, magnified left anterior oblique and magnified lateral views of the head were obtained. The catheter was then placed into the right vertebral artery. Frontal and lateral views of the head were obtained. The catheter was placed into the right common carotid artery. Frontal and lateral views of the neck were obtained followed by frontal and lateral views of the head. Attempted to advance an exchange length wire [facility] proved unsuccessful due to catheter retraction. The catheter was then advanced over the wire  into the aortic arch and exchanged over the wire for a 5 [place] Berenstein 2 catheter. The Berenstein 2 catheter was then advanced into the right common carotid artery. Frontal and lateral angiograms of the neck were obtained. An exchange length wire was then placed into the distal right external carotid artery. The Berenstein catheter then was exchanged over the wire and under biplane roadmap for a 6 [place] benchmark guide catheter which was placed in the distal right common carotid artery. Frontal and lateral angiograms of the neck were obtained in used as roadmap. Then, a 4-7 mm Emboshield NAV 6 cerebral protection device was navigated and deployed into the distal cervical segment of the right ICA. Subsequently, a 2.5 x 15 mm apex  balloon was navigated into the right carotid bulb, at the level of stenosis. Angioplasty was performed under fluoroscopy. The balloon was then removed. Next, a 6 x 22 mm carotid wall stent was deployed across the right carotid bifurcation. Then, a 5 x 30 mm Viatrac balloon was navigated into the recently deployed stent and in stent angioplasty was performed under fluoroscopy. Patient developed transient reactive bradycardia which rapidly improved with balloon deflation. The balloon was removed and the cerebral protection device was subsequently recaptured. Right common carotid artery angiograms with frontal and lateral views of the neck showed appropriate stent position across the right carotid bifurcation with complete resolution of stenosis and excellent anterograde flow. Right internal carotid artery angiograms with frontal and lateral views of the head showed no evidence of thromboembolic complication. Delay right common carotid artery angiograms with frontal and lateral views of the neck showed no evidence of in stent clot formation. Flat panel CT of the head was obtained and post processed in a separate workstation with concurrent attending physician supervision. Selected images were sent to PACS. No evidence of hemorrhagic complication. The catheter was subsequently withdrawn. An inflatable band was placed and inflated over the right hand access site. The vascular sheath was withdrawn and the band was slowly deflated until brisk flow was noted through the arteriotomy site. At this point, the band was reinflated with additional 2 cc of air to obtain patent hemostasis. FINDINGS: 1. Luminal irregularity of the right subclavian artery consistent with atherosclerotic disease without hemodynamically significant stenosis. There is increased tortuosity of the V1 segment of the right vertebral artery without hemodynamically significant stenosis. 2. Atherosclerotic changes of the left carotid bulb without  hemodynamically significant stenosis. 3. Atherosclerotic changes of the intracranial left ICA more pronounced at the supraclinoid segment where there is approximately 65% stenosis. 4. 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 5. Luminal irregularities are seen along the basilar, bilateral PCA and SCA branches, consistent with intracranial atherosclerotic disease without hemodynamically significant stenosis. 6. Prominent atherosclerotic changes in the right carotid bifurcation resulting in approximately 90% stenosis at the origin of the right ICA. 7. Atherosclerotic changes of the intracranial right ICA with approximately 50% stenosis at the cavernous segment. 8. Prominent infundibular dilatation (4 mm) versus aneurysm noted at the origin of the right posterior communicating artery. 9. Post stenting angiograms showed complete resolution of the cervical right ICA stenosis. IMPRESSION: 1. Successful and uncomplicated diagnostic cerebral angiogram followed by cervical right ICA angioplasty and stenting with complete resolution of stenosis. 2. A 65% stenosis at the supraclinoid segment of the left ICA. This can be treated with angioplasty and stenting showed become symptomatic. 3. A 50% stenosis at the cavernous segment of the right ICA. 4.  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 Given patient's age, conservative management is recommended. PLAN: Patient is to remain on dual anti-platelet therapy with aspirin and Plavix for 3 months when stent patency will be evaluated with carotid ultrasound. Electronically Signed   By: Pedro Earls M.D.   On: 01/04/2021 15:07   IR ANGIO VERTEBRAL SEL VERTEBRAL UNI R MOD SED  Result Date: 01/04/2021 INDICATION: Iyah Doolin is a 87 year old female with past medical history of anxiety, CAD, DM, HLD,  HTN, presented to [facility] ED with numbness in the left hand for the past 10 days. MR Brain showed patchy R frontoparietal lobe infarcts including the perirorlandic region. CTA Head and Neck was showed high-grade stenosis of the R ICA. She comes to our service today for a diagnostic cerebral angiogram and possible right ICA angioplasty and stenting. EXAM: ULTRASOUND-GUIDED VASCULAR ACCESS DIAGNOSTIC CEREBRAL ANGIOGRAM RIGHT CAROTID STENTING WITH CEREBRAL PROTECTION DEVICE MEDICATIONS: 5000 units of heparin, 32m verapamil and 2039ms nitroglycerin intra-arterially. 2000 units of heparin IV. 200 mcg of nitroglycerin subcutaneous. ANESTHESIA/SEDATION: The procedure was performed under monitored anesthesia care (MAC). FLUOROSCOPY TIME:  Fluoroscopy Time: 45 minutes 48 seconds (1,294 mGy). COMPLICATIONS: None immediate. TECHNIQUE: Informed written consent was obtained from the patient's grandson after a thorough discussion of the procedural risks, benefits and alternatives. All questions were addressed. Maximal Sterile Barrier Technique was utilized including caps, mask, sterile gowns, sterile gloves, sterile drape, hand hygiene and skin antiseptic. A timeout was performed prior to the initiation of the procedure. PROCEDURE: Using the modified Seldinger technique and a micropuncture kit, access was gained to the distal right radial artery at the anatomical snuffbox and a 7 French sheath was placed. Real-time ultrasound guidance was utilized for vascular access including the acquisition of a permanent ultrasound image documenting patency of the accessed vessel. Slow intra arterial infusion of 5,000 IU heparin, 5 mg Verapamil and 20893cg nitroglicerin diluted in patient's own blood was performed. No significant fluctuation in patient's blood pressure seen. Then, a right radial artery roadmap was obtained via sheath side port. Normal brachial artery branching pattern seen. No significant anatomical variation. The right radial  artery caliber is adequate for vascular access. Next, a 5 FrPak[place]immons 2 glide catheter was navigated over a 0.035" Terumo Glidewire into the right subclavian artery under fluoroscopic guidance. Frontal angiogram was obtained all followed by frontal and lateral views of the head. The catheter was then navigated into the aortic arch and the tip was reformed in the descending aorta. The catheter was then advanced into the left common carotid artery. Frontal and lateral angiograms of the neck were obtained. Following, frontal, lateral, magnified left anterior oblique and magnified lateral views of the head were obtained. The catheter was then placed into the right vertebral artery. Frontal and lateral views of the head were obtained. The catheter was placed into the right common carotid artery. Frontal and lateral views of the neck were obtained followed by frontal and lateral views of the head. Attempted to advance an exchange length wire through the [facility] glide catheter proved unsuccessful due to catheter retraction. The catheter was then advanced over the wire into the aortic arch and exchanged over the wire for a 5 FrPakistanerenstein 2 catheter. The Berenstein 2 catheter was then advanced into the right common carotid artery. Frontal and lateral angiograms of the neck were obtained. An exchange length wire  was then placed into the distal right external carotid artery. The Berenstein catheter then was exchanged over the wire and under biplane roadmap for a 6 [place] benchmark guide catheter which was placed in the distal right common carotid artery. Frontal and lateral angiograms of the neck were obtained in used as roadmap. Then, a 4-7 mm Emboshield NAV 6 cerebral protection device was navigated and deployed into the distal cervical segment of the right ICA. Subsequently, a 2.5 x 15 mm apex balloon was navigated into the right carotid bulb, at the level of stenosis. Angioplasty was performed under fluoroscopy.  The balloon was then removed. Next, a 6 x 22 mm carotid wall stent was deployed across the right carotid bifurcation. Then, a 5 x 30 mm Viatrac balloon was navigated into the recently deployed stent and in stent angioplasty was performed under fluoroscopy. Patient developed transient reactive bradycardia which rapidly improved with balloon deflation. The balloon was removed and the cerebral protection device was subsequently recaptured. Right common carotid artery angiograms with frontal and lateral views of the neck showed appropriate stent position across the right carotid bifurcation with complete resolution of stenosis and excellent anterograde flow. Right internal carotid artery angiograms with frontal and lateral views of the head showed no evidence of thromboembolic complication. Delay right common carotid artery angiograms with frontal and lateral views of the neck showed no evidence of in stent clot formation. Flat panel CT of the head was obtained and post processed in a separate workstation with concurrent attending physician supervision. Selected images were sent to PACS. No evidence of hemorrhagic complication. The catheter was subsequently withdrawn. An inflatable band was placed and inflated over the right hand access site. The vascular sheath was withdrawn and the band was slowly deflated until brisk flow was noted through the arteriotomy site. At this point, the band was reinflated with additional 2 cc of air to obtain patent hemostasis. FINDINGS: 1. Luminal irregularity of the right subclavian artery consistent with atherosclerotic disease without hemodynamically significant stenosis. There is increased tortuosity of the V1 segment of the right vertebral artery without hemodynamically significant stenosis. 2. Atherosclerotic changes of the left carotid bulb without hemodynamically significant stenosis. 3. Atherosclerotic changes of the intracranial left ICA more pronounced at the supraclinoid  segment where there is approximately 65% stenosis. 4. 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 5. Luminal irregularities are seen along the basilar, bilateral PCA and SCA branches, consistent with intracranial atherosclerotic disease without hemodynamically significant stenosis. 6. Prominent atherosclerotic changes in the right carotid bifurcation resulting in approximately 90% stenosis at the origin of the right ICA. 7. Atherosclerotic changes of the intracranial right ICA with approximately 50% stenosis at the cavernous segment. 8. Prominent infundibular dilatation (4 mm) versus aneurysm noted at the origin of the right posterior communicating artery. 9. Post stenting angiograms showed complete resolution of the cervical right ICA stenosis. IMPRESSION: 1. Successful and uncomplicated diagnostic cerebral angiogram followed by cervical right ICA angioplasty and stenting with complete resolution of stenosis. 2. A 65% stenosis at the supraclinoid segment of the left ICA. This can be treated with angioplasty and stenting showed become symptomatic. 3. A 50% stenosis at the cavernous segment of the right ICA. 4. 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 Given patient's age, conservative management is recommended. PLAN: Patient is to remain on dual anti-platelet therapy with aspirin and  Plavix for 3 months when stent patency will be evaluated with carotid ultrasound. Electronically Signed   By: Pedro Earls M.D.   On: 01/04/2021 15:07   (Echo, Carotid, EGD, Colonoscopy, ERCP)    Subjective: No complaints  Discharge Exam: Vitals:   01/05/21 0909 01/05/21 1000  BP: (!) 131/42 (!) 132/50  Pulse: 61 66  Resp: (!) 21 (!) 22  Temp:    SpO2: 98% 92%   Vitals:   01/05/21 0700 01/05/21 0800 01/05/21 0909 01/05/21 1000  BP: (!)  132/44  (!) 131/42 (!) 132/50  Pulse: 68 68 61 66  Resp: (!) 23 (!) 22 (!) 21 (!) 22  Temp:  100.1 F (37.8 C)    TempSrc:      SpO2: 97% 98% 98% 92%  Weight:      Height:        General: Pt is alert, awake, not in acute distress Cardiovascular: RRR, S1/S2 +, no rubs, no gallops Respiratory: CTA bilaterally, no wheezing, no rhonchi Abdominal: Soft, NT, ND, bowel sounds + Extremities: no edema, no cyanosis    The results of significant diagnostics from this hospitalization (including imaging, microbiology, ancillary and laboratory) are listed below for reference.     Microbiology: Recent Results (from the past 240 hour(s))  Resp Panel by RT-PCR (Flu A&B, Covid) Nasopharyngeal Swab     Status: None   Collection Time: 12/30/20 12:53 PM   Specimen: Nasopharyngeal Swab; Nasopharyngeal(NP) swabs in vial transport medium  Result Value Ref Range Status   SARS Coronavirus 2 by RT PCR NEGATIVE NEGATIVE Final    Comment: (NOTE) SARS-CoV-2 target nucleic acids are NOT DETECTED.  The SARS-CoV-2 RNA is generally detectable in upper respiratory specimens during the acute phase of infection. The lowest concentration of SARS-CoV-2 viral copies this assay can detect is 138 copies/mL. A negative result does not preclude SARS-Cov-2 infection and should not be used as the sole basis for treatment or other patient management decisions. A negative result may occur with  improper specimen collection/handling, submission of specimen other than nasopharyngeal swab, presence of viral mutation(s) within the areas targeted by this assay, and inadequate number of viral copies(<138 copies/mL). A negative result must be combined with clinical observations, patient history, and epidemiological information. The expected result is Negative.  Fact Sheet for Patients:  EntrepreneurPulse.com.au  Fact Sheet for Healthcare Providers:  IncredibleEmployment.be  This test  is no t yet approved or cleared by the [place] FDA and  has been authorized for detection and/or diagnosis of SARS-CoV-2 by FDA under an Emergency Use Authorization (EUA). This EUA will remain  in effect (meaning this test can be used) for the duration of the COVID-19 declaration under Section 564(b)(1) of the Act, 21 U.S.C.section 360bbb-3(b)(1), unless the authorization is terminated  or revoked sooner.       Influenza A by PCR NEGATIVE NEGATIVE Final   Influenza B by PCR NEGATIVE NEGATIVE Final    Comment: (NOTE) The Xpert Xpress SARS-CoV-2/FLU/RSV plus assay is intended as an aid in the diagnosis of influenza from Nasopharyngeal swab specimens and should not be used as a sole basis for treatment. Nasal washings and aspirates are unacceptable for Xpert Xpress SARS-CoV-2/FLU/RSV testing.  Fact Sheet for Patients: EntrepreneurPulse.com.au  Fact Sheet for Healthcare Providers: IncredibleEmployment.be  This test is not yet approved or cleared by the [place] FDA and has been authorized for detection and/or diagnosis of SARS-CoV-2 by FDA under an Emergency Use Authorization (EUA). This EUA will remain in  effect (meaning this test can be used) for the duration of the COVID-19 declaration under Section 564(b)(1) of the Act, 21 U.S.C. section 360bbb-3(b)(1), unless the authorization is terminated or revoked.  Performed at [facility], [place] [address] [place], [place] [address]   MRSA Next Gen by PCR, Nasal     Status: None   Collection Time: 01/03/21 11:05 PM   Specimen: Nasal Mucosa; Nasal Swab  Result Value Ref Range Status   MRSA by PCR Next Gen NOT DETECTED NOT DETECTED Final    Comment: (NOTE) The GeneXpert MRSA Assay (FDA approved for NASAL specimens only), is one component of a comprehensive MRSA colonization surveillance program. It is not intended to diagnose MRSA infection nor to guide or monitor  treatment for MRSA infections. Test performance is not FDA approved in patients less than 64 years old. Performed at [facility], [place] [address], [place], [place] [address]      Labs: BNP (last 3 results) No results for input(s): BNP in the last 8760 hours. Basic Metabolic Panel: Recent Labs  Lab 12/30/20 1039 12/30/20 1208 01/01/21 1300  NA 140 139 138  K 4.3 4.3 4.1  CL 106 105 107  CO2 25  --  24  GLUCOSE 271* 280* 161*  BUN _0 CREATININE 0.72 0.60 0.62  CALCIUM 10.0  --  9.7   Liver Function Tests: Recent Labs  Lab 12/30/20 1039  AST 17  ALT 17  ALKPHOS 56  BILITOT 0.3  PROT 7.4  ALBUMIN 3.8   No results for input(s): LIPASE, AMYLASE in the last 168 hours. No results for input(s): AMMONIA in the last 168 hours. CBC: Recent Labs  Lab 12/30/20 1039 12/30/20 1208  WBC 9.7  --   NEUTROABS 5.4  --   HGB 12.5 11.6*  HCT 39.7 34.0*  MCV 91.1  --   PLT 301  --    Cardiac Enzymes: No results for input(s): CKTOTAL, CKMB, CKMBINDEX, TROPONINI in the last 168 hours. BNP: Invalid input(s): POCBNP CBG: Recent Labs  Lab 01/04/21 0739 01/04/21 1154 01/04/21 1831 01/04/21 2152 01/05/21 0803  GLUCAP 330* 305* 257* 132* 125*   D-Dimer No results for input(s): DDIMER in the last 72 hours. Hgb A1c No results for input(s): HGBA1C in the last 72 hours. Lipid Profile Recent Labs    01/05/21 0555  TRIG 73   Thyroid function studies No results for input(s): TSH, T4TOTAL, T3FREE, THYROIDAB in the last 72 hours.  Invalid input(s): FREET3 Anemia work up No results for input(s): VITAMINB12, FOLATE, FERRITIN, TIBC, IRON, RETICCTPCT in the last 72 hours. Urinalysis    Component Value Date/Time   COLORURINE YELLOW 03/13/2016 1145   APPEARANCEUR CLOUDY (A) 03/13/2016 1145   LABSPEC 1.029 03/13/2016 1145   PHURINE 5.5 03/13/2016 1145   GLUCOSEU >1000 (A) 03/13/2016 1145   HGBUR NEGATIVE 03/13/2016 1145   HGBUR negative 05/08/2010 0811    BILIRUBINUR NEGATIVE 03/13/2016 1145   KETONESUR NEGATIVE 03/13/2016 1145   PROTEINUR 30 (A) 03/13/2016 1145   UROBILINOGEN 0.2 05/08/2010 0811   NITRITE NEGATIVE 03/13/2016 1145   LEUKOCYTESUR NEGATIVE 03/13/2016 1145   Sepsis Labs Invalid input(s): PROCALCITONIN,  WBC,  LACTICIDVEN Microbiology Recent Results (from the past 240 hour(s))  Resp Panel by RT-PCR (Flu A&B, Covid) Nasopharyngeal Swab     Status: None   Collection Time: 12/30/20 12:53 PM   Specimen: Nasopharyngeal Swab; Nasopharyngeal(NP) swabs in vial transport medium  Result Value Ref Range Status  SARS Coronavirus 2 by RT PCR NEGATIVE NEGATIVE Final    Comment: (NOTE) SARS-CoV-2 target nucleic acids are NOT DETECTED.  The SARS-CoV-2 RNA is generally detectable in upper respiratory specimens during the acute phase of infection. The lowest concentration of SARS-CoV-2 viral copies this assay can detect is 138 copies/mL. A negative result does not preclude SARS-Cov-2 infection and should not be used as the sole basis for treatment or other patient management decisions. A negative result may occur with  improper specimen collection/handling, submission of specimen other than nasopharyngeal swab, presence of viral mutation(s) within the areas targeted by this assay, and inadequate number of viral copies(<138 copies/mL). A negative result must be combined with clinical observations, patient history, and epidemiological information. The expected result is Negative.  Fact Sheet for Patients:  EntrepreneurPulse.com.au  Fact Sheet for Healthcare Providers:  IncredibleEmployment.be  This test is no t yet approved or cleared by the [place] FDA and  has been authorized for detection and/or diagnosis of SARS-CoV-2 by FDA under an Emergency Use Authorization (EUA). This EUA will remain  in effect (meaning this test can be used) for the duration of the COVID-19 declaration under  Section 564(b)(1) of the Act, 21 U.S.C.section 360bbb-3(b)(1), unless the authorization is terminated  or revoked sooner.       Influenza A by PCR NEGATIVE NEGATIVE Final   Influenza B by PCR NEGATIVE NEGATIVE Final    Comment: (NOTE) The Xpert Xpress SARS-CoV-2/FLU/RSV plus assay is intended as an aid in the diagnosis of influenza from Nasopharyngeal swab specimens and should not be used as a sole basis for treatment. Nasal washings and aspirates are unacceptable for Xpert Xpress SARS-CoV-2/FLU/RSV testing.  Fact Sheet for Patients: EntrepreneurPulse.com.au  Fact Sheet for Healthcare Providers: IncredibleEmployment.be  This test is not yet approved or cleared by the [place] FDA and has been authorized for detection and/or diagnosis of SARS-CoV-2 by FDA under an Emergency Use Authorization (EUA). This EUA will remain in effect (meaning this test can be used) for the duration of the COVID-19 declaration under Section 564(b)(1) of the Act, 21 U.S.C. section 360bbb-3(b)(1), unless the authorization is terminated or revoked.  Performed at [facility], [place] [address] [place], [place] [address]   MRSA Next Gen by PCR, Nasal     Status: None   Collection Time: 01/03/21 11:05 PM   Specimen: Nasal Mucosa; Nasal Swab  Result Value Ref Range Status   MRSA by PCR Next Gen NOT DETECTED NOT DETECTED Final    Comment: (NOTE) The GeneXpert MRSA Assay (FDA approved for NASAL specimens only), is one component of a comprehensive MRSA colonization surveillance program. It is not intended to diagnose MRSA infection nor to guide or monitor treatment for MRSA infections. Test performance is not FDA approved in patients less than 73 years old. Performed at [facility], [place] [address] [place],  [address]      Time coordinating discharge: Over 30 minutes  SIGNED:   Charlynne Cousins, MD  [facility] 01/05/2021, 10:39 AM Pager   If 7PM-7AM, please contact night-coverage [email or web address] Password [email or web address]

## 2021-01-05 NOTE — Progress Notes (Addendum)
IP rehab admissions - I have approval for inpatient rehab admission and I have a bed available on CIR today.  I will check with attending for medical readiness.  Call for questions.  [phone]  10:38 - I have medical clearance for CIR admit and will admit today.  [phone]

## 2021-01-06 ENCOUNTER — Inpatient Hospital Stay (HOSPITAL_COMMUNITY): Payer: Medicare Other

## 2021-01-06 LAB — CBC WITH DIFFERENTIAL/PLATELET
Abs Immature Granulocytes: 0.21 10*3/uL — ABNORMAL HIGH (ref 0.00–0.07)
Basophils Absolute: 0 10*3/uL (ref 0.0–0.1)
Basophils Relative: 0 %
Eosinophils Absolute: 0.1 10*3/uL (ref 0.0–0.5)
Eosinophils Relative: 1 %
HCT: 28.7 % — ABNORMAL LOW (ref 36.0–46.0)
Hemoglobin: 9 g/dL — ABNORMAL LOW (ref 12.0–15.0)
Immature Granulocytes: 2 %
Lymphocytes Relative: 14 %
Lymphs Abs: 1.8 10*3/uL (ref 0.7–4.0)
MCH: 29.2 pg (ref 26.0–34.0)
MCHC: 31.4 g/dL (ref 30.0–36.0)
MCV: 93.2 fL (ref 80.0–100.0)
Monocytes Absolute: 0.8 10*3/uL (ref 0.1–1.0)
Monocytes Relative: 6 %
Neutro Abs: 10.4 10*3/uL — ABNORMAL HIGH (ref 1.7–7.7)
Neutrophils Relative %: 77 %
Platelets: 211 10*3/uL (ref 150–400)
RBC: 3.08 MIL/uL — ABNORMAL LOW (ref 3.87–5.11)
RDW: 14.4 % (ref 11.5–15.5)
WBC: 13.4 10*3/uL — ABNORMAL HIGH (ref 4.0–10.5)
nRBC: 0 % (ref 0.0–0.2)

## 2021-01-06 LAB — GLUCOSE, CAPILLARY
Glucose-Capillary: 111 mg/dL — ABNORMAL HIGH (ref 70–99)
Glucose-Capillary: 95 mg/dL (ref 70–99)
Glucose-Capillary: 103 mg/dL — ABNORMAL HIGH (ref 70–99)
Glucose-Capillary: 61 mg/dL — ABNORMAL LOW (ref 70–99)
Glucose-Capillary: 111 mg/dL — ABNORMAL HIGH (ref 70–99)

## 2021-01-06 LAB — COMPREHENSIVE METABOLIC PANEL
ALT: 19 U/L (ref 0–44)
AST: 23 U/L (ref 15–41)
Albumin: 2.4 g/dL — ABNORMAL LOW (ref 3.5–5.0)
Alkaline Phosphatase: 59 U/L (ref 38–126)
Anion gap: 6 (ref 5–15)
BUN: 16 mg/dL (ref 8–23)
CO2: 26 mmol/L (ref 22–32)
Calcium: 8.8 mg/dL — ABNORMAL LOW (ref 8.9–10.3)
Chloride: 108 mmol/L (ref 98–111)
Creatinine, Ser: 0.73 mg/dL (ref 0.44–1.00)
GFR, Estimated: >60 mL/min (ref >60)
Glucose, Bld: 130 mg/dL — ABNORMAL HIGH (ref 70–99)
Potassium: 3.7 mmol/L (ref 3.5–5.1)
Sodium: 140 mmol/L (ref 135–145)
Total Bilirubin: 0.7 mg/dL (ref 0.3–1.2)
Total Protein: 5.7 g/dL — ABNORMAL LOW (ref 6.5–8.1)

## 2021-01-06 MED ORDER — AMLODIPINE BESYLATE 5 MG PO TABS
5.0000 mg | ORAL_TABLET | Freq: Every day | ORAL | Status: DC
  Administered 2021-01-06 – 2021-01-20 (×15): 5 mg via ORAL
  Filled 2021-01-06 (×14): qty 1

## 2021-01-06 MED ORDER — LOSARTAN POTASSIUM 50 MG PO TABS
50.0000 mg | ORAL_TABLET | Freq: Every day | ORAL | Status: DC
  Administered 2021-01-06 – 2021-01-19 (×14): 50 mg via ORAL
  Filled 2021-01-06 (×14): qty 1

## 2021-01-06 MED ORDER — POTASSIUM CHLORIDE 20 MEQ PO PACK
40.0000 meq | PACK | Freq: Once | ORAL | Status: AC
  Administered 2021-01-06: 40 meq via ORAL
  Filled 2021-01-06: qty 2

## 2021-01-06 NOTE — Progress Notes (Signed)
Hypoglycemic Event  CBG: 61  Treatment: OJ and chocolate pudding  Symptoms: Asymptomatic  Follow-up CBG: Time:  1714 CBG Result: 103  Possible Reasons for Event: patient refused to eat lunch due to eating late breakfast  Comments/MD notified: followed protocol    Amadeo Garnet, LPN

## 2021-01-06 NOTE — Progress Notes (Signed)
Inpatient Rehabilitation Center Individual Statement of Services  Patient Name:  Jo Larson  Date:  01/06/2021  Welcome to the Inpatient Rehabilitation Center.  Our goal is to provide you with an individualized program based on your diagnosis and situation, designed to meet your specific needs.  With this comprehensive rehabilitation program, you will be expected to participate in at least 3 hours of rehabilitation therapies Monday-Friday, with modified therapy programming on the weekends.  Your rehabilitation program will include the following services:  Physical Therapy (PT), Occupational Therapy (OT), Speech Therapy (ST), 24 hour per day rehabilitation nursing, Care Coordinator, Rehabilitation Medicine, Nutrition Services, and Pharmacy Services  Weekly team conferences will be held on Tuesday to discuss your progress.  Your Inpatient Rehabilitation Care Coordinator will talk with you frequently to get your input and to update you on team discussions.  Team conferences with you and your family in attendance may also be held.  Expected length of stay: 2-3 weeks  Overall anticipated outcome: overall min assist level  Depending on your progress and recovery, your program may change. Your Inpatient Rehabilitation Care Coordinator will coordinate services and will keep you informed of any changes. Your Inpatient Rehabilitation Care Coordinator's name and contact numbers are listed  below.  The following services may also be recommended but are not provided by the Inpatient Rehabilitation Center:   Home Health Rehabiltiation Services Outpatient Rehabilitation Services    Arrangements will be made to provide these services after discharge if needed.  Arrangements include referral to agencies that provide these services.  Your insurance has been verified to be:  [employer]-Medicare Your primary doctor is:  Delbert Harness  Pertinent information will be shared with your doctor and your insurance  company.  Inpatient Rehabilitation Care Coordinator:  Dossie Der, [place] [phone] or Luna Glasgow  Information discussed with and copy given to patient by: Lucy Chris, 01/06/2021, 11:31 AM

## 2021-01-06 NOTE — Progress Notes (Signed)
Inpatient Rehabilitation  Patient information reviewed and entered into eRehab system by Sheyann Sulton Roxanne Panek, OTR/L.   Information including medical coding, functional ability and quality indicators will be reviewed and updated through discharge.    

## 2021-01-06 NOTE — Progress Notes (Signed)
Inpatient Rehabilitation Medication Review by a Pharmacist  A complete drug regimen review was completed for this patient to identify any potential clinically significant medication issues.  Clinically significant medication issues were identified:  yes   Type of Medication Issue Identified Description of Issue Urgent (address now) Non-Urgent (address on AM team rounds) Plan Plan Accepted by Provider? (Yes / No / Pending AM Rounds)  Drug Interaction(s) (clinically significant)       Duplicate Therapy       Allergy       No Medication Administration End Date       Incorrect Dose       Additional Drug Therapy Needed  Metoprolol 12.5mg  BID - not cont Pepcid - not cont on admission Vit D - not cont on admission Non-urgent restart Pam Love is aware. Plan to hold BB d/t bradycardia and reduce amlo  Other  Amlodipine - discontinued on discharge med rec but cont on admission Non-urgent Assess need     Name of provider notified for urgent issues identified:   Provider Method of Notification:    For non-urgent medication issues to be resolved on team rounds tomorrow morning a [facility] Secure Chat Handoff was sent to: Nicolette Bang   Pharmacist comments:   Time spent performing this drug regimen review (minutes):  5 minutes   Ulyses Southward, PharmD, [place], AAHIVP, CPP Infectious Disease Pharmacist 01/06/2021 12:37 PM

## 2021-01-06 NOTE — Progress Notes (Signed)
Patient with bradycardia and boderline BP this am--norvasc decreased to 5 mg, Cozaar decreased to 50 mg and changed to evening and to hold am dose catapres. She had severe abdominal pain this am, not had breakfast till 11 due to lethargy/therapy and was incontinent of bladder/stool on exam. Stratus used for interpretation but she was flat, slow to respond and kept nodding off and required repetition of question to answer/respond appropriately. Daughter in law came in at the end of conversation and was updated--patient did perk up briefly during her interaction with family.  Patient/DIL updated on work up--UA/UCS ordered due to reports of abdominal pain/back pain, Senna/Miralax d/c due to diarrhea and pulmonary hygiene encouraged to help with atelectasis/oxygenation.

## 2021-01-06 NOTE — Progress Notes (Signed)
Inpatient Rehabilitation Care Coordinator Assessment and Plan Patient Details  Name: Mylie Mccurley MRN: [id number] Date of Birth: [birth year]  Today's Date: 01/06/2021  Hospital Problems: Principal Problem:   Stroke (cerebrum) [facility]) Active Problems:   Type 2 diabetes mellitus without complication, without long-term current use of insulin (HCC)  Past Medical History:  Past Medical History:  Diagnosis Date   Abnormal chest x-ray    Angioedema    a. 05/2014 - felt to be 2/2 clindamycin therapy.   Anxiety    CAD (coronary artery disease)    a. 07/2014 NSTEMI/PCI: LM nl, LAD min irregs, LCX 90m (4.0x18 Xience DES), LPDA 90 (28mm vessel->med Rx), RCA nl, EF 60%.   Diabetes mellitus without complication (HCC)    Hyperlipemia    Hypertension    Hypothyroidism    Past Surgical History:  Past Surgical History:  Procedure Laterality Date   INTUBATION NASOTRACHEAL N/A 04/03/2013   Procedure: FIBEROPTIC NASAL-TRACHEAL INTUBATION;  Surgeon: Christia Reading, MD;  Location: [facility] ORS;  Service: ENT;  Laterality: N/A;   IR ANGIO INTRA EXTRACRAN SEL COM CAROTID INNOMINATE BILAT MOD SED  01/03/2021   IR ANGIO VERTEBRAL SEL VERTEBRAL UNI R MOD SED  01/03/2021   IR INTRAVSC STENT CERV CAROTID W/EMB-PROT MOD SED INCL ANGIO  01/03/2021   IR US GUIDE VASC ACCESS RIGHT  01/03/2021   LEFT HEART CATHETERIZATION WITH CORONARY ANGIOGRAM N/A 07/26/2014   Procedure: LEFT HEART CATHETERIZATION WITH CORONARY ANGIOGRAM;  Surgeon: Runell Gess, MD;  Location: [facility] CATH LAB;  Service: Cardiovascular;  Laterality: N/A;   RADIOLOGY WITH ANESTHESIA Right 01/03/2021   Procedure: IR WITH ANESTHESIA - CAROTID STENT;  Surgeon: Baldemar Lenis, MD;  Location: [facility] OR;  Service: Radiology;  Laterality: Right;   Social History:  reports that she has never smoked. She has never used smokeless tobacco. She reports that she does not drink alcohol and does not use drugs.  Family / Support Systems Marital Status:  Widow/Widower Patient Roles: Parent, Other (Comment) (grandparent) Children: Two children out of the country Other Supports: Radojka-daughter in-law [phone]  Katheran James [phone]  Joeseph Amor [phone] Anticipated Caregiver: Daughter in-law and grandchildren Ability/Limitations of Caregiver: Daughter in-law works second shift but grandchildren work first shift Medical laboratory scientific officer: 24/7 (Between family can provide 24/7) Family Dynamics: Close with family and extended family. She lives with daughte rin-law and grandchildren and all help one another. Pt was independent prior to admission  Social History Preferred language: [place] Religion: Unknown Cultural Background: from [place] does not speak any english Education: Some in [place] Read: Yes Write: Yes Employment Status: Retired Marine scientist Issues: No issues Guardian/Conservator: None-according to MD pt is not fully capable of making her own decisions while here. Will look toward family to make any decisions while here   Abuse/Neglect Abuse/Neglect Assessment Can Be Completed: Yes Physical Abuse: Denies Verbal Abuse: Denies Sexual Abuse: Denies Exploitation of patient/patient's resources: Denies Self-Neglect: Denies  Emotional Status Pt's affect, behavior and adjustment status: Pt is eating and diverts to daughtr in-law she reports since her husband/pt's sn passed away she has not done well emotionally. She physically has been independent and was mobile but struggled with his loss. Recent Psychosocial Issues: other health issues-depression/anxiety issues Psychiatric History: Depression/anxiety since son passed away-loss of interest in anything has she has gotten older. Daughter in-law worries about her and changed her work hours to be there with her during the day Substance Abuse History: No issues  Patient / Family Perceptions, Expectations & Goals Pt/Family understanding of  illness & functional  limitations: Pt and daughter in-law can explain her stroke and weakness, both hope she will do well here and recover from this stroke. They do talk with the medical team and feel they have a basic understanding of her treatment plan going forward. Premorbid pt/family roles/activities: mom, grandmother mother in-law, church member, etc Anticipated changes in roles/activities/participation: resume Pt/family expectations/goals: Pt states: " I want to go home soon."  Daughter in-law states: " I hope she does well here and can be mobile again."  [employer]: None Premorbid Home Care/DME Agencies: None Transportation available at discharge: Family members  Discharge Planning Living Arrangements: Other relatives Support Systems: Other relatives, Psychologist, clinical community Type of Residence: Private residence Insurance Resources: Media planner (specify) Investment banker, operational) Financial Resources: Family Support Financial Screen Referred: No Living Expenses: Lives with family Money Management: Family Does the patient have any problems obtaining your medications?: No Home Management: Family Patient/Family Preliminary Plans: Return home with family members who between all can provide care to pt. Hoping will not be much physical care, since so independent prior to admission. Will need an interpreter while here and seems to do better when family member is here. Care Coordinator Barriers to Discharge: Other (comments) Care Coordinator Barriers to Discharge Comments: language barrier Care Coordinator Anticipated Follow Up Needs: HH/OP  Clinical Impression Pleasant female who is motivated to improve but would like a family member here with her. She is brighter and more communicative when they are here. Daughter in-law plans pt be here every am before going to work. Her grandchildren work first shift so will be here in the evening. Family aware will need 24/7 care at discharge and  between them plan to provide this. Await therapy team evaluation and work on discharge needs. May see if neuro-psych can see wile here.  Lucy Chris 01/06/2021, 11:27 AM

## 2021-01-06 NOTE — Evaluation (Signed)
Occupational Therapy Assessment and Plan  Patient Details  Name: Jo Larson MRN: [id number] Date of Birth: [birth year]  OT Diagnosis: abnormal posture, acute pain, cognitive deficits, hemiplegia affecting dominant side, and muscle weakness (generalized) Rehab Potential: Rehab Potential (ACUTE ONLY): Good ELOS: 2-3 weeks   Today's Date: 01/06/2021 OT Individual Time: 4098-1191 OT Individual Time Calculation (min): 45 min     Hospital Problem: Principal Problem:   Stroke (cerebrum) ([place]) Active Problems:   Type 2 diabetes mellitus without complication, without long-term current use of insulin ([place])   Past Medical History:  Past Medical History:  Diagnosis Date   Abnormal chest x-ray    Angioedema    a. 05/2014 - felt to be 2/2 clindamycin therapy.   Anxiety    CAD (coronary artery disease)    a. 07/2014 NSTEMI/PCI: LM nl, LAD min irregs, LCX 66m(4.0x18 Xience DES), LPDA 90 (235mvessel->med Rx), RCA nl, EF 60%.   [place]   Hyperlipemia    Hypertension    Hypothyroidism    Past Surgical History:  Past Surgical History:  Procedure Laterality Date   INTUBATION NASOTRACHEAL N/A 04/03/2013   Procedure: FIBEROPTIC NASAL-TRACHEAL INTUBATION;  Surgeon: DwMelida QuitterMD;  Location: [facility]S;  Service: ENT;  Laterality: N/A;   IR ANGIO INTRA EXTRACRAN SEL COM CAROTID INNOMINATE BILAT MOD SED  01/03/2021   IR ANGIO VERTEBRAL SEL VERTEBRAL UNI R MOD SED  01/03/2021   IR INTRAVSC STENT CERV CAROTID W/EMB-PROT MOD SED INCL ANGIO  01/03/2021   IR [place] VASC ACCESS RIGHT  01/03/2021   LEFT HEART CATHETERIZATION WITH CORONARY ANGIOGRAM N/A 07/26/2014   Procedure: LEFT HEART CATHETERIZATION WITH CORONARY ANGIOGRAM;  Surgeon: JoLorretta HarpMD;  [facility];  Service: Cardiovascular;  Laterality: N/A;   RADIOLOGY WITH ANESTHESIA Right 01/03/2021   Procedure: IR WITH ANESTHESIA - CAROTID STENT;  Surgeon: dePedro EarlsMD;  [facility] Service: Radiology;  Laterality: Right;    Assessment & Plan Clinical Impression: Jo Larson is an [age]ear old female with history of CAD, T2DM, HTN, depression/anxiety who was admitted on 12/30/20 with progressive LUE weakness X few days. MRI/MRA brain done revealing patchy acute infarcts in right frontoparietal lobes and minimal right occipital lobe involvement and no intracranial stenosis.  2 D echo done revealing EF 60-65% with moderate concentric LVH, aortic sclerosis with mild AVR. Neurology recommended DAPT X 3 weeks followed by ASA alone. CTA head/neck ordered for work up on 06/13 and showed 80% stenosis in right carotid bulb and 1-2 mm outpouching from left M1/MCA segment at origin lenticulostriate artery s/o small aneurysm v/s infundibulum.     She was evaluated by Dr. DeJanifer Larson underwent diagnostic cerebral angiogram with right carotid stenting on 06/14. Post procedure CT without evidence of bleed. Post procedure to continue DAPT X 3 months (will need carotid ultrasound for follow up).  She continues to be limited by left sided weakness with sensory deficits, visual deficits, cognitive deficits with delay in processing and possibly motor planning.  Patient transferred to CIR on 01/05/2021 .    Patient currently requires mod with basic self-care skills secondary to muscle weakness, decreased cardiorespiratoy endurance, decreased coordination and decreased motor planning, decreased attention, decreased awareness, decreased problem solving, decreased safety awareness, decreased memory, and delayed processing, and decreased sitting balance, decreased standing balance, decreased postural control, hemiplegia, and decreased balance strategies.  Prior to hospitalization, patient could complete BADLs with supervision.  Patient  will benefit from skilled intervention to decrease level of assist with basic self-care skills prior to discharge home with care partner.  Anticipate  patient will require 24 hour supervision and minimal physical assistance and follow up home health.  OT - End of Session Activity Tolerance: Tolerates 10 - 20 min activity with multiple rests Endurance Deficit: Yes Endurance Deficit Description: Seated rest breaks needed b/w functional mobility tasks OT Assessment Rehab Potential (ACUTE ONLY): Good OT Patient demonstrates impairments in the following area(s): Balance;Perception;Safety;Cognition;Sensory;Endurance;Motor;Pain;Vision OT Basic ADL's Functional Problem(s): Grooming;Bathing;Dressing;Toileting OT Transfers Functional Problem(s): Toilet;Tub/Shower OT Additional Impairment(s): Fuctional Use of Upper Extremity OT Plan OT Intensity: Minimum of 1-2 x/day, 45 to 90 minutes OT Frequency: 5 out of 7 days OT Duration/Estimated Length of Stay: 2-3 weeks OT Treatment/Interventions: Balance/vestibular training;Discharge planning;Functional electrical stimulation;Pain management;Self Care/advanced ADL retraining;Therapeutic Activities;UE/LE Coordination activities;Cognitive remediation/compensation;Disease mangement/prevention;Functional mobility training;Patient/family education;Therapeutic Exercise;Visual/perceptual remediation/compensation;DME/adaptive equipment instruction;Neuromuscular re-education;Psychosocial support;UE/LE Strength taining/ROM;Wheelchair propulsion/positioning OT Recommendation Patient destination: Home Follow Up Recommendations: 24 hour supervision/assistance Equipment Recommended: To be determined   OT Evaluation Precautions/Restrictions  Precautions Precautions: Fall Precaution Comments: Non-English speaking. Needs interpreter - [place] Restrictions Weight Bearing Restrictions: No General Chart Reviewed: Yes OT Amount of Missed Time: 15 Minutes Vital Signs Therapy Vitals Pulse Rate: 66 BP: (!) 155/56 Patient Position (if appropriate): Lying Oxygen Therapy SpO2: 95 % O2 Device: Room Air Pain Pain  Assessment Pain Scale: Faces Faces Pain Scale: Hurts little more Pain Type: Acute pain Pain Location: Abdomen Pain Orientation: Mid Pain Descriptors / Indicators: Discomfort Pain Onset: On-going Pain Intervention(s): RN made aware Multiple Pain Sites: Yes Home Living/Prior Functioning Home Living Family/patient expects to be discharged to:: Private residence Living Arrangements: Other relatives Available Help at Discharge: Family, Available 24 hours/day Type of Home: House Home Access: Stairs to enter Technical brewer of Steps: 4-5 Home Layout: One level Bathroom Shower/Tub: Chiropodist: Standard Additional Comments: Pt lives with her daughter in law, 2 grandsons, and grand daughter who provide 24 hour assist (per medical chart)  Lives With: Family Prior Function Level of Independence: Independent with gait, Independent with transfers, Independent with homemaking with ambulation  Able to Take Stairs?: Yes Driving: No Vision Baseline Vision/History: Wears glasses (per medical chart) Patient Visual Report: Other (comment) (will further assess) Vision Assessment?: Vision impaired- to be further tested in functional context Perception  Perception: Impaired Comments: Mild L inattention -needs to be further tested in functional context Praxis Praxis: Intact Cognition Overall Cognitive Status: Difficult to assess Arousal/Alertness: Awake/alert Orientation Level: Person Attention: Focused Focused Attention: Appears intact Awareness: Impaired Awareness Impairment: Anticipatory impairment;Emergent impairment Problem Solving: Impaired Problem Solving Impairment: Functional basic Safety/Judgment: Impaired Sensation Sensation Light Touch: Impaired by gross assessment (difficult to assess due to language barrier) Hot/Cold: Not tested Proprioception: Impaired by gross assessment Stereognosis: Not tested Coordination Gross Motor Movements are Fluid and  Coordinated: No Fine Motor Movements are Fluid and Coordinated: No Coordination and Movement Description: slowed and effortful Finger Nose Finger Test: impaired LUE; WNL RUE Motor  Motor Motor: Hemiplegia Motor - Skilled Clinical Observations: L hemi  Trunk/Postural Assessment  Cervical Assessment Cervical Assessment: Exceptions to [facility] (forward head) Thoracic Assessment Thoracic Assessment: Exceptions to [facility] (rounded shoulders) Lumbar Assessment Lumbar Assessment: Exceptions to [facility] (posterior pelvic tilt) Postural Control Postural Control: Deficits on evaluation (delayed)  Balance Balance Balance Assessed: Yes Static Sitting Balance Static Sitting - Balance Support: Feet supported Static Sitting - Level of Assistance: Other (comment) (CGA) Dynamic Sitting Balance Dynamic Sitting - Balance Support: During functional activity  Dynamic Sitting - Level of Assistance: 4: Min Insurance risk surveyor Standing - Balance Support: Bilateral upper extremity supported Static Standing - Level of Assistance: 4: Min assist Dynamic Standing Balance Dynamic Standing - Balance Support: During functional activity;Bilateral upper extremity supported Dynamic Standing - Level of Assistance: 3: Mod assist Extremity/Trunk Assessment RUE Assessment RUE Assessment: Within Functional Limits LUE Assessment LUE Assessment: Exceptions to [facility] General Strength Comments: 3+/5  Care Tool Care Tool Self Care Eating        Oral Care         Bathing              Upper Body Dressing(including orthotics)            Lower Body Dressing (excluding footwear)          Putting on/Taking off footwear             Care Tool Toileting Toileting activity   Assist for toileting: Total Assistance - Patient < 25%     Care Tool Bed Mobility Roll left and right activity   Roll left and right assist level: Minimal Assistance - Patient > 75%    Sit to lying activity   Sit to lying  assist level: Moderate Assistance - Patient 50 - 74%    Lying to sitting edge of bed activity   Lying to sitting edge of bed assist level: Moderate Assistance - Patient 50 - 74%     Care Tool Transfers Sit to stand transfer   Sit to stand assist level: Moderate Assistance - Patient 50 - 74%    Chair/bed transfer   Chair/bed transfer assist level: Moderate Assistance - Patient 50 - 74%     Toilet transfer         Care Tool Cognition Expression of Ideas and Wants Expression of Ideas and Wants: Some difficulty - exhibits some difficulty with expressing needs and ideas (e.g, some words or finishing thoughts) or speech is not clear   Understanding Verbal and Non-Verbal Content Understanding Verbal and Non-Verbal Content: Usually understands - understands most conversations, but misses some part/intent of message. Requires cues at times to understand   Memory/Recall Ability *first 3 days only Memory/Recall Ability *first 3 days only: That he or she is in a hospital/hospital unit;Current season    Refer to Care Plan for Long Term Goals  SHORT TERM GOAL WEEK 1 OT Short Term Goal 1 (Week 1): Pt will complete LB dressing with mod assist. OT Short Term Goal 2 (Week 1): Pt will complete toilet transfer with min assist OT Short Term Goal 3 (Week 1): Pt will complete bathing with mod assist.  Recommendations for other services: None    Skilled Therapeutic Intervention ADL ADL Toileting: Maximal assistance Where Assessed-Toileting: Bed level Mobility  Bed Mobility Bed Mobility: Supine to Sit;Sit to Supine Rolling Right: Minimal Assistance - Patient > 75% Supine to Sit: Moderate Assistance - Patient 50-74% Sit to Supine: Moderate Assistance - Patient 50-74% Transfers Sit to Stand: Moderate Assistance - Patient 50-74% Stand to Sit: Moderate Assistance - Patient 50-74%   Discharge Criteria: Patient will be discharged from OT if patient refuses treatment 3 consecutive times without  medical reason, if treatment goals not met, if there is a change in medical status, if patient makes no progress towards goals or if patient is discharged from hospital.  The above assessment, treatment plan, treatment alternatives and goals were discussed and mutually agreed upon: by patient  Ezekiel Slocumb 01/06/2021,  4:16 PM

## 2021-01-06 NOTE — Progress Notes (Signed)
Patient ID: Jo Larson, female   DOB: 11-22-1931, 87 y.o.   MRN: [id number] Met with the patient and her daughter in law to introduce self and the role of the nurse CM. Reviewed secondary stroke risks including HTN, HLD and DM. Daughter in law aware of DM and reported monitoring at the home prior to admission. Also concerned about elevated A!C as patient does not consume a lot of carbs or starchy foods per report. And does take prescribed medications. Also reviewed elevated triglyceride and dietary modifications. Continue to follow along to discharge to address educational needs and collaborate with the SW to facilitate preparation for discharge. Margarito Liner, RN

## 2021-01-06 NOTE — Progress Notes (Signed)
PROGRESS NOTE   Subjective/Complaints: Patient's chart reviewed- No issues reported overnight Elevated BP and bradycardia.  +small bilateral effusions and atelectasis.  +leukocytosis.   Objective:   DG Chest 2 View  Result Date: 01/05/2021 CLINICAL DATA:  87 year old female with shortness of breath. EXAM: CHEST - 2 VIEW COMPARISON:  Chest radiograph dated 09/03/2016. FINDINGS: Shallow inspiration. Mild chronic interstitial coarsening pitted minimal left lung base atelectasis. No focal consolidation. Small bilateral pleural effusions. No pneumothorax. Stable mild cardiomegaly. Atherosclerotic calcification of the aorta. Osteopenia with degenerative changes of the spine. No acute osseous pathology. IMPRESSION: Small bilateral pleural effusions and minimal bibasilar atelectasis. Electronically Signed   By: Elgie Collard M.D.   On: 01/05/2021 20:11   Recent Labs    01/06/21 0522  WBC 13.4*  HGB 9.0*  HCT 28.7*  PLT 211   Recent Labs    01/06/21 0522  NA 140  K 3.7  CL 108  CO2 26  GLUCOSE 130*  BUN 16  CREATININE 0.73  CALCIUM 8.8*    Intake/Output Summary (Last 24 hours) at 01/06/2021 1127 Last data filed at 01/06/2021 0500 Gross per 24 hour  Intake 183 ml  Output 22 ml  Net 161 ml        Physical Exam: Vital Signs Blood pressure (!) 152/56, pulse (!) 57, temperature 98.5 F (36.9 C), temperature source Oral, resp. rate 17, height 5' (1.524 m), weight 75.6 kg, SpO2 95 %. Gen: no distress, normal appearing HEENT: oral mucosa pink and moist, NCAT Cardio: Reg rate Chest: normal effort, normal rate of breathing Abd: soft, non-distended Ext: no edema Psych: pleasant, normal affect   Musculoskeletal:    Cervical back: Normal range of motion and neck supple.    Comments: Spontaneous movement (due to tactile stimuli) seen of B/L feet/ankles as well as L hand- appeared less than expected in LUE- attempted to pull  LUE away from me, so biceps at least 3/5 based on exam  Skin:    Comments: Sacral foam in place- per nurse, for prevention  L hand IV- pressure dressing on L wrist (nurse says it's old)  Neurological:    Mental Status: She is easily aroused. She is lethargic.    Comments: Sleepy and tended to keep eyes closed. Flat affect with soft, slurred speech--edentulous with left facial weakness. Question bilateral visual field deficits. Left hemiplegia with sensory deficits and question apraxia v/s inattention. Not able to keep eyes open, so sleepy/sedated.    Psychiatric:    Comments: lethargic    Assessment/Plan: 1. Functional deficits which require 3+ hours per day of interdisciplinary therapy in a comprehensive inpatient rehab setting. Physiatrist is providing close team supervision and 24 hour management of active medical problems listed below. Physiatrist and rehab team continue to assess barriers to discharge/monitor patient progress toward functional and medical goals  Care Tool:  Bathing              Bathing assist       Upper Body Dressing/Undressing Upper body dressing   What is the patient wearing?: Hospital gown only    Upper body assist Assist Level: Set up assist    Lower Body Dressing/Undressing Lower body  dressing      What is the patient wearing?: Incontinence brief     Lower body assist Assist for lower body dressing: Maximal Assistance - Patient 25 - 49%     Toileting Toileting    Toileting assist Assist for toileting: Total Assistance - Patient < 25% (incon. during night)     Transfers Chair/bed transfer  Transfers assist           Locomotion Ambulation   Ambulation assist              Walk 10 feet activity   Assist           Walk 50 feet activity   Assist           Walk 150 feet activity   Assist           Walk 10 feet on uneven surface  activity   Assist           Wheelchair     Assist                Wheelchair 50 feet with 2 turns activity    Assist            Wheelchair 150 feet activity     Assist          Blood pressure (!) 152/56, pulse (!) 57, temperature 98.5 F (36.9 C), temperature source Oral, resp. rate 17, height 5' (1.524 m), weight 75.6 kg, SpO2 95 %.  Medical Problem List and Plan: 1.  L hemiparesis and visual deficits secondary to R frontoparietal infarcts/patchy infarcts             -patient may  shower             -ELOS/Goals: 14-21 days- Supervision to min A after exam  -Continue CIR  2.  Antithrombotics: -DVT/anticoagulation:  Pharmaceutical: Lovenox             -antiplatelet therapy: 3. Headaches/Pain Management: Will schedule tylenol qid to help with HA/Pain 4. Mood: LCSW to follow for evaluation and support. Team to provide ego support.              -antipsychotic agents: N/a 5. Neuropsych: This patient is possibly not capable of making decisions on her own behalf. 6. Skin/Wound Care: . Routine pressure relief measures.  7. Fluids/Electrolytes/Nutrition: Monitor I/O. Check lytes in am 8. HTN: Monitor BP tid--continue Norvasc, catapres and cozaar.             --will check orthostatic vitals as reporting dizziness with transitional movements 9. T2DM: Hgb A1c-. 8.8 and poorly controlled. Continue dose of levermir tonight --Will resume amaryl today and metformin tomorrow (48 hours post dye) --Will continue to monitor BS ac/hs and use SSI for elevated BS. 10. Hypoxia: Educated on pulmonary hygeine. --Question fluid overload--Grade 1 DD w/mild AVR/sclerosis.               --CXR ordered with daily weights for monitoring. 11.  Dyslipidemia: 12.  H/o Depression: Continue Zoloft. 13. Hypothyroid: Managed with supplementation.  14. Pt doesn't speak [place]- from [place]- daughter and grandson speak English-  15. Bradycardia: decreased amlodipine to 5mg  16. Abdominal pain: UA/UC ordered.  17. Diarrhea: senna/Miralax d/ced.   18. Leukocytosis: repeat CBC ordered for tomorrow to trend. UA as above. (Additional labs also drawn to assess for nutritional deficiencies: I will f/u these on Monday). 19. Hypokalemia: supplement today and repeat tomorrow (I can supplement on Monday if still low).  LOS: 1 days A FACE TO FACE EVALUATION WAS PERFORMED  Drema Pry Clark Clowdus 01/06/2021, 11:27 AM

## 2021-01-06 NOTE — Evaluation (Signed)
Physical Therapy Assessment and Plan  Patient Details  Name: Jo Larson MRN: [id number] Date of Birth: 1932-03-29  PT Diagnosis: Abnormality of gait, Cognitive deficits, Difficulty walking, Hemiparesis non-dominant, Impaired cognition, Impaired sensation, and Muscle weakness Rehab Potential: Good ELOS: 2-3 weeks   Today's Date: 01/06/2021 PT Individual Time: 1300-1410 PT Individual Time Calculation (min): 70 min    Hospital Problem: Principal Problem:   Stroke (cerebrum) ([place]) Active Problems:   Type 2 diabetes mellitus without complication, without long-term current use of insulin ([place])   Past Medical History:  Past Medical History:  Diagnosis Date   Abnormal chest x-ray    Angioedema    a. 05/2014 - felt to be 2/2 clindamycin therapy.   Anxiety    CAD (coronary artery disease)    a. 07/2014 NSTEMI/PCI: LM nl, LAD min irregs, LCX 33m(4.0x18 Xience DES), LPDA 90 (256mvessel->med Rx), RCA nl, EF 60%.   Diabetes mellitus without complication ([facility]   Hyperlipemia    Hypertension    Hypothyroidism    Past Surgical History:  Past Surgical History:  Procedure Laterality Date   INTUBATION NASOTRACHEAL N/A 04/03/2013   Procedure: FIBEROPTIC NASAL-TRACHEAL INTUBATION;  Surgeon: DwMelida QuitterMD;  Location: [facility];  Service: ENT;  Laterality: N/A;   IR ANGIO INTRA EXTRACRAN SEL COM CAROTID INNOMINATE BILAT MOD SED  01/03/2021   IR ANGIO VERTEBRAL SEL VERTEBRAL UNI R MOD SED  01/03/2021   IR INTRAVSC STENT CERV CAROTID W/EMB-PROT MOD SED INCL ANGIO  01/03/2021   IR [place] VASC ACCESS RIGHT  01/03/2021   LEFT HEART CATHETERIZATION WITH CORONARY ANGIOGRAM N/A 07/26/2014   Procedure: LEFT HEART CATHETERIZATION WITH CORONARY ANGIOGRAM;  Surgeon: JoLorretta HarpMD;  [facility];  Service: Cardiovascular;  Laterality: N/A;   RADIOLOGY WITH ANESTHESIA Right 01/03/2021   Procedure: IR WITH ANESTHESIA - CAROTID STENT;  Surgeon: dePedro EarlsMD;  [facility]  Service: Radiology;  Laterality: Right;    Assessment & Plan Clinical Impression: Patient is an [age]ear old female with history of CAD, T2DM, HTN, depression/anxiety who was admitted on 12/30/20 with progressive LUE weakness X few days. MRI/MRA brain done revealing patchy acute infarcts in right frontoparietal lobes and minimal right occipital lobe involvement and no intracranial stenosis.  2 D echo done revealing EF 60-65% with moderate concentric LVH, aortic sclerosis with mild AVR. Neurology recommended DAPT X 3 weeks followed by ASA alone. CTA head/neck ordered for work up on 06/13 and showed 80% stenosis in right carotid bulb and 1-2 mm outpouching from left M1/MCA segment at origin lenticulostriate artery s/o small aneurysm v/s infundibulum.     She was evaluated by Dr. DeJanifer Adiend underwent diagnostic cerebral angiogram with right carotid stenting on 06/14. Post procedure CT without evidence of bleed. Post procedure to continue DAPT X 3 months (will need carotid ultrasound for follow up).  She continues to be limited by left sided weakness with sensory deficits, visual deficits, cognitive deficits with delay in processing and possibly motor planning. CIR recommended due to functional decline.Patient transferred to CIR on 01/05/2021 .   Patient currently requires mod with mobility secondary to muscle weakness, decreased cardiorespiratoy endurance, unbalanced muscle activation and decreased motor planning, decreased initiation, decreased attention, decreased awareness, decreased problem solving, decreased safety awareness, and delayed processing, and decreased sitting balance, decreased standing balance, decreased postural control, hemiplegia, and decreased balance strategies.  Prior to hospitalization, patient was independent  with mobility [facility]  Home access is 4-5Stairs to enter.  Patient will benefit from skilled PT intervention to maximize safe  functional mobility, minimize fall risk, and decrease caregiver burden for planned discharge home with 24 hour assist.  Anticipate patient will benefit from follow up [facility] at discharge.  PT - End of Session Activity Tolerance: Tolerates 30+ min activity with multiple rests Endurance Deficit: Yes Endurance Deficit Description: Seated rest breaks needed b/w functional mobility tasks PT Assessment Rehab Potential (ACUTE/IP ONLY): Good PT Barriers to Discharge: Decreased caregiver support;Insurance for SNF coverage;Home environment access/layout;Lack of/limited family support;Weight PT Patient demonstrates impairments in the following area(s): Balance;Endurance;Motor;Nutrition;Pain;Safety;Sensory;Perception;Skin Integrity PT Transfers Functional Problem(s): Bed Mobility;Bed to Chair;Car PT Locomotion Functional Problem(s): Ambulation;Stairs PT Plan PT Intensity: Minimum of 1-2 x/day ,45 to 90 minutes PT Frequency: 5 out of 7 days PT Duration Estimated Length of Stay: 2-3 weeks PT Treatment/Interventions: Ambulation/gait training;Cognitive remediation/compensation;Balance/vestibular training;Community reintegration;Discharge planning;Disease management/prevention;DME/adaptive equipment instruction;Functional electrical stimulation;Functional mobility training;Neuromuscular re-education;Pain management;Patient/family education;Psychosocial support;Skin care/wound management;Splinting/orthotics;Stair training;Therapeutic Activities;Therapeutic Exercise;UE/LE Strength taining/ROM;UE/LE Coordination activities;Visual/perceptual remediation/compensation;Wheelchair propulsion/positioning PT Transfers Anticipated Outcome(s): supervision with LRAD PT Locomotion Anticipated Outcome(s): CGA with LRAD PT Recommendation Recommendations for Other Services: Neuropsych consult Follow Up Recommendations: Home health PT;24 hour supervision/assistance Patient destination: Home Equipment Recommended: To be  determined Equipment Details: Pt owns no DME   PT Evaluation Precautions/Restrictions Precautions Precautions: Fall Precaution Comments: Non-English speaking. Needs interpreter - [place] Restrictions Weight Bearing Restrictions: No Home Living/Prior Functioning Home Living Available Help at Discharge: Family;Available 24 hours/day Type of Home: House Home Access: Stairs to enter [employer] of Steps: 4-5 Home Layout: One level Bathroom Shower/Tub: Chiropodist: Standard Additional Comments: Pt lives with her daughter in law, 2 grandsons, and grand daughter who provide 24 hour assist (per medical chart)  Lives With: Family Prior Function Level of Independence: Independent with gait;Independent with transfers;Independent with homemaking with ambulation  Able to Take Stairs?: Yes Driving: No Vision/Perception  Perception Perception: Impaired Comments: Mild L inattention -needs to be further tested in functional context Praxis Praxis: Intact  Cognition Overall Cognitive Status: Difficult to assess Arousal/Alertness: Awake/alert Orientation Level: Oriented to person;Oriented to place;Disoriented to situation;Disoriented to time Attention: Focused Focused Attention: Appears intact Awareness: Impaired Awareness Impairment: Anticipatory impairment;Emergent impairment Problem Solving: Impaired Problem Solving Impairment: Functional basic Safety/Judgment: Impaired Sensation Sensation Light Touch: Impaired by gross assessment (difficult to assess due to language barrier) Hot/Cold: Not tested Proprioception: Impaired by gross assessment Stereognosis: Not tested Coordination Gross Motor Movements are Fluid and Coordinated: No Fine Motor Movements are Fluid and Coordinated: No Coordination and Movement Description: slowed and effortful Finger Nose Finger Test: impaired LUE; WNL RUE Motor  Motor Motor: Hemiplegia Motor - Skilled Clinical  Observations: L hemi   Trunk/Postural Assessment  Cervical Assessment Cervical Assessment: Exceptions to [facility] (forward head) Thoracic Assessment Thoracic Assessment: Exceptions to [facility] (rounded shoulders) Lumbar Assessment Lumbar Assessment: Exceptions to [facility] (posterior pelvic tilt) Postural Control Postural Control: Deficits on evaluation (delayed)  Balance Balance Balance Assessed: Yes Static Sitting Balance Static Sitting - Balance Support: Feet supported Static Sitting - Level of Assistance: Other (comment) (CGA) Dynamic Sitting Balance Dynamic Sitting - Balance Support: During functional activity Dynamic Sitting - Level of Assistance: 4: Min Insurance risk surveyor Standing - Balance Support: Bilateral upper extremity supported Static Standing - Level of Assistance: 4: Min assist Dynamic Standing Balance Dynamic Standing - Balance Support: During functional activity;Bilateral upper extremity supported Dynamic Standing - Level of Assistance: 3: Mod assist Extremity Assessment  RUE Assessment RUE Assessment: Within Functional Limits LUE Assessment LUE Assessment: Exceptions to [facility] General Strength Comments: 3+/5 RLE Assessment RLE Assessment: Exceptions to [facility] General Strength Comments: Grossly 4-/5 LLE Assessment LLE Assessment: Exceptions to [facility] General Strength Comments: Ankle DF 4-/5, knee ext 4-./5, hip flex 3/5  Care Tool Care Tool Bed Mobility Roll left and right activity   Roll left and right assist level: Minimal Assistance - Patient > 75%    Sit to lying activity   Sit to lying assist level: Moderate Assistance - Patient 50 - 74%    Lying to sitting edge of bed activity   Lying to sitting edge of bed assist level: Moderate Assistance - Patient 50 - 74%     Care Tool Transfers Sit to stand transfer   Sit to stand assist level: Moderate Assistance - Patient 50 - 74%    Chair/bed transfer   Chair/bed transfer assist level: Moderate  Assistance - Patient 50 - 74%     Physiological scientist transfer assist level: Moderate Assistance - Patient 50 - 74%      Care Tool Locomotion Ambulation   Assist level: Moderate Assistance - Patient 50 - 74% Assistive device: Walker-rolling Max distance: 85f  Walk 10 feet activity   Assist level: Moderate Assistance - Patient - 50 - 74% Assistive device: Walker-rolling   Walk 50 feet with 2 turns activity Walk 50 feet with 2 turns activity did not occur: Safety/medical concerns      Walk 150 feet activity Walk 150 feet activity did not occur: Safety/medical concerns      Walk 10 feet on uneven surfaces activity Walk 10 feet on uneven surfaces activity did not occur: Safety/medical concerns      Stairs Stair activity did not occur: Safety/medical concerns        Walk up/down 1 step activity Walk up/down 1 step or curb (drop down) activity did not occur: Safety/medical concerns     Walk up/down 4 steps activity did not occuR: Safety/medical concerns  Walk up/down 4 steps activity      Walk up/down 12 steps activity Walk up/down 12 steps activity did not occur: Safety/medical concerns      Pick up small objects from floor Pick up small object from the floor (from standing position) activity did not occur: Safety/medical concerns      Wheelchair Will patient use wheelchair at discharge?: No   Wheelchair activity did not occur: N/A      Wheel 50 feet with 2 turns activity Wheelchair 50 feet with 2 turns activity did not occur: N/A    Wheel 150 feet activity Wheelchair 150 feet activity did not occur: N/A      Refer to Care Plan for Long Term Goals  SHORT TERM GOAL WEEK 1 PT Short Term Goal 1 (Week 1): Pt will complete bed mobility with minA PT Short Term Goal 2 (Week 1): Pt will complete bed<>chair transfers with minA and LRAD PT Short Term Goal 3 (Week 1): Pt will ambulate 572fwith minA and LRAD PT Short Term Goal 4 (Week 1): Pt will  initiate stair training  Recommendations for other services: Neuropsych  Skilled Therapeutic Intervention Mobility Bed Mobility Bed Mobility: Supine to Sit;Sit to Supine Rolling Right: Minimal Assistance - Patient > 75% Supine to Sit: Moderate Assistance - Patient 50-74% Sit to Supine: Moderate Assistance - Patient 50-74% Transfers Transfers: Sit to Stand;Stand to SiLockheed Martinransfers Sit  to Stand: Moderate Assistance - Patient 50-74% Stand to Sit: Moderate Assistance - Patient 50-74% Stand Pivot Transfers: Moderate Assistance - Patient 50 - 74% Stand Pivot Transfer Details: Tactile cues for weight beaing;Tactile cues for placement;Tactile cues for posture;Tactile cues for weight shifting;Tactile cues for initiation;Verbal cues for gait pattern;Verbal cues for safe use of DME/AE;Verbal cues for precautions/safety;Verbal cues for technique;Verbal cues for sequencing;Visual cues/gestures for precautions/safety;Visual cues/gestures for sequencing Transfer (Assistive device): 1 person hand held assist Locomotion  Gait Ambulation: Yes Gait Assistance: Moderate Assistance - Patient 50-74% Gait Distance (Feet): 10 Feet Assistive device: Rolling walker Gait Assistance Details: Tactile cues for posture;Tactile cues for weight shifting;Tactile cues for sequencing;Tactile cues for initiation;Tactile cues for placement;Visual cues for safe use of DME/AE;Visual cues/gestures for sequencing;Verbal cues for safe use of DME/AE;Verbal cues for gait pattern;Verbal cues for precautions/safety;Verbal cues for technique;Verbal cues for sequencing;Manual facilitation for weight shifting;Manual facilitation for placement Gait Gait: Yes Gait Pattern: Impaired Gait Pattern: Step-to pattern;Decreased step length - left;Decreased stance time - left;Decreased hip/knee flexion - left;Decreased dorsiflexion - left;Decreased weight shift to left;Left flexed knee in stance;Trunk flexed;Poor foot clearance -  left Gait velocity: decreased Stairs / Additional Locomotion Stairs: No  Skilled intervention: Pt greeted supine in bed to start PT evaluation - pt agreeable. Used [employer] interpreter throughout as pt is non-English speaking, speaks [place] [phone]. Pt alert but with flat affect, oriented to self, location only. She follows simple commands but delayed processing. Difficult to assess MMT with LLE due to cognition vs language barrier. She required maxA for lower body and upper body dressing. She is able to roll in the bed with minA, requires modA for sit<>supine, and is able to sit unsupported with CGA at EOB. She can complete sit<>stand transfers with modA and L knee block with no AD and requires modA for stand<>pivot transfer to w/c. She was able to complete car transfers with modA and RW and ambulate up to 73f with modA and RW. With gait, she demonstrated decreased LLE weight shifting, poor L foot clearance, L knee remained flexed in stance, and decreased ability to maintain grip on RW - anticipate need for RW splint to allow LUE to maintain grip to RW. She completed session in bed with needs in reach and bed alarm on. Extra time was needed throughout session due to language barrier.   Instructed pt in results of PT evaluation as detailed above, PT POC, rehab potential, rehab goals, and discharge recommendations. Additionally discussed CIR's policies regarding fall safety and use of chair alarm and/or quick release belt. Pt verbalized understanding and in agreement. Will update pt's family members as they become available.   Discharge Criteria: Patient will be discharged from PT if patient refuses treatment 3 consecutive times without medical reason, if treatment goals not met, if there is a change in medical status, if patient makes no progress towards goals or if patient is discharged from hospital.  The above assessment, treatment plan, treatment alternatives and goals were discussed and  mutually agreed upon: by patient  CAlger SimonsPT, DPT 01/06/2021, 4:00 PM

## 2021-01-06 NOTE — Evaluation (Signed)
Speech Language Pathology Assessment and Plan  Patient Details  Name: Jo Larson MRN: [id number] Date of Birth: 1931-11-06  SLP Diagnosis: Cognitive Impairments;Dysphagia  Rehab Potential: Good ELOS: 2-3 weeks    Today's Date: 01/06/2021 SLP Individual Time: 1100-1200 SLP Individual Time Calculation (min): 60 min   Hospital Problem: Principal Problem:   Stroke (cerebrum) ([place]) Active Problems:   Type 2 diabetes mellitus without complication, without long-term current use of insulin ([place])  Past Medical History:  Past Medical History:  Diagnosis Date   Abnormal chest x-ray    Angioedema    a. 05/2014 - felt to be 2/2 clindamycin therapy.   Anxiety    CAD (coronary artery disease)    a. 07/2014 NSTEMI/PCI: LM nl, LAD min irregs, LCX 3m (4.0x18 Xience DES), LPDA 90 (38mm vessel->med Rx), RCA nl, EF 60%.   Diabetes mellitus without complication ([place])    Hyperlipemia    Hypertension    Hypothyroidism    Past Surgical History:  Past Surgical History:  Procedure Laterality Date   INTUBATION NASOTRACHEAL N/A 04/03/2013   Procedure: FIBEROPTIC NASAL-TRACHEAL INTUBATION;  Surgeon: Melida Quitter, MD;  Location: [facility];  Service: ENT;  Laterality: N/A;   IR ANGIO INTRA EXTRACRAN SEL COM CAROTID INNOMINATE BILAT MOD SED  01/03/2021   IR ANGIO VERTEBRAL SEL VERTEBRAL UNI R MOD SED  01/03/2021   IR INTRAVSC STENT CERV CAROTID W/EMB-PROT MOD SED INCL ANGIO  01/03/2021   IR US GUIDE VASC ACCESS RIGHT  01/03/2021   LEFT HEART CATHETERIZATION WITH CORONARY ANGIOGRAM N/A 07/26/2014   Procedure: LEFT HEART CATHETERIZATION WITH CORONARY ANGIOGRAM;  Surgeon: Lorretta Harp, MD;  Location: [facility] CATH LAB;  Service: Cardiovascular;  Laterality: N/A;   RADIOLOGY WITH ANESTHESIA Right 01/03/2021   Procedure: IR WITH ANESTHESIA - CAROTID STENT;  Surgeon: Pedro Earls, MD;  Location: [place];  Service: Radiology;  Laterality: Right;    Assessment / Plan / Recommendation Clinical Impression  Jo Larson is an 87 year old female with history of CAD, T2DM, HTN, depression/anxiety who was admitted on 12/30/20 with progressive LUE weakness X few days. MRI/MRA brain done revealing patchy acute infarcts in right frontoparietal lobes and minimal right occipital lobe involvement and no intracranial stenosis.  2 D echo done revealing EF 60-65% with moderate concentric LVH, aortic sclerosis with mild AVR. Neurology recommended DAPT X 3 weeks followed by ASA alone. CTA head/neck ordered for work up on 06/13 and showed 80% stenosis in right carotid bulb and 1-2 mm outpouching from left M1/MCA segment at origin lenticulostriate artery s/o small aneurysm v/s infundibulum. She was evaluated by Dr. Janifer Adie and underwent diagnostic cerebral angiogram with right carotid stenting on 06/14. Post procedure CT without evidence of bleed. Post procedure to continue DAPT X 3 months (will need carotid ultrasound for follow up). She continues to be limited by left sided weakness with sensory deficits, visual deficits, cognitive deficits with delay in processing and possibly motor planning.  Patient transferred to CIR on 01/05/2021.  Patient presents with mild-to-moderate cognitive impairment as evidenced by decreased orientation, attention, short-term recall, and delayed processing. Patient was oriented to being in the hospital, however she was disoriented to situation, location (town), and time. Sustained attention was limited, as patient was easily distractible to internal and external stimuli (I.e., complaints of abdominal discomfort, and noise in hallway). Patient unable to recall previously discussed details after short-term delay (5 minutes). Benefited from additional processing time and verbal repetition of instruction and/or questions. Daughter-in-law was present  and reported patient is currently functioning below baseline from a cognitive perspective. She reported current speech and communication appears  at baseline. Daughter-in-law was agreeable to provide translation, as well as virtual interpreter, who also reported clear speech and denied visible difficulty with expressive communication skills. Further cognitive assessment is warranted, as assessment was limited due to nursing needs (bed pan, self care assist).    BSE completed revealing a mild oropharyngeal dysphagia. Deficits most notable in the oral phase including prolonged mastication which appears largely attributed to edentulous status. Patient exhibited moderate amounts of oral residue and pocketing in both L and R buccal cavities which cleared mostly with liquid rinse. Patient appeared impulsive with self feeding as evidenced by rapid rate of consumption and consecutive large bites prior to initiating swallow. She required cueing to slow down and take small bites which were minimally effective until SLP provided visual and tactile cues to put utensil down to focus on mastication. Despite no pharyngeal symptoms were observed, patient complained of pharyngeal/esophageal retention by pointing to mid-to-lower chest region. Additionally expressed "it feels like something is in my throat." Patient denied esophageal symptoms prior to CVA. Daughter-in-law reported vomiting episode yesterday after meal. Patient may benefit from GI assessment to further assess if symptoms persist. Recommend continuation of dys 2 diet and thin liquids with full supervision during meals.   Patient would benefit from skilled SLP treatment to maximize cognitive function and independence, and swallow safety.   Skilled Therapeutic Interventions          Cognitive-linguistic evaluation, BSE, and education with patient daughter-in-law on stroke, cognition, and safe swallowing strategies and precautions.    SLP Assessment  Patient will need skilled Speech Lanaguage Pathology Services during CIR admission    Recommendations  Recommended Consults: Consider GI evaluation SLP Diet  Recommendations: Dysphagia 2 (Fine chop);Thin;1:1 - comment (full supervision for safety secondary to impulsiveness with PO intake) Liquid Administration via: Straw;Cup Medication Administration: Whole meds with liquid Supervision: Full supervision/cueing for compensatory strategies Compensations: Minimize environmental distractions;Slow rate;Small sips/bites;Lingual sweep for clearance of pocketing Postural Changes and/or Swallow Maneuvers: Out of bed for meals;Seated upright 90 degrees Oral Care Recommendations: Oral care BID Patient destination: Home Follow up Recommendations: 24 hour supervision/assistance Equipment Recommended: None recommended by SLP    SLP Frequency 3 to 5 out of 7 days   SLP Duration  SLP Intensity  SLP Treatment/Interventions 2-3 weeks  Minumum of 1-2 x/day, 30 to 90 minutes  Cognitive remediation/compensation;Environmental Environmental consultant;Dysphagia/aspiration precaution training;Functional tasks;Patient/family education;Therapeutic Activities    Pain Pain Assessment Pain Scale: 0-10 Pain Score: 2  Faces Pain Scale: Hurts a little bit Pain Location: Abdomen Pain Orientation: Mid Pain Descriptors / Indicators: Discomfort Pain Onset: On-going Pain Intervention(s): RN made aware  Prior Functioning Type of Home: House  Lives With: Family Available Help at Discharge: Family;Available 24 hours/day  SLP Evaluation Cognition Overall Cognitive Status: Difficult to assess Arousal/Alertness: Awake/alert Orientation Level: Oriented to person;Disoriented to place;Disoriented to time;Disoriented to situation Attention: Sustained Focused Attention: Impaired Focused Attention Impairment: Verbal basic;Functional basic Sustained Attention: Impaired Sustained Attention Impairment: Verbal basic;Functional basic Awareness: Impaired Awareness Impairment: Anticipatory impairment;Emergent impairment Problem Solving: Impaired Problem Solving Impairment:  Functional basic Safety/Judgment: Impaired  Comprehension Auditory Comprehension Overall Auditory Comprehension: Impaired Commands: Impaired One Step Basic Commands: 50-74% accurate Two Step Basic Commands: 25-49% accurate Visual Recognition/Discrimination Discrimination: Not tested Reading Comprehension Reading Status: Not tested Expression Expression Primary Mode of Expression: Verbal Verbal Expression Overall Verbal Expression: Appears within functional limits for tasks assessed  Initiation: No impairment Level of Generative/Spontaneous Verbalization: Financial controller Expression Dominant Hand: Left Written Expression: Not tested Oral Motor Oral Motor/Sensory Function Overall Oral Motor/Sensory Function: Within functional limits Motor Speech Overall Motor Speech: Appears within functional limits for tasks assessed Respiration: Within functional limits Phonation: Normal Resonance: Within functional limits Articulation: Within functional limitis Intelligibility: Intelligible Motor Planning: Witnin functional limits  Care Tool Care Tool Cognition Expression of Ideas and Wants Expression of Ideas and Wants: Some difficulty - exhibits some difficulty with expressing needs and ideas (e.g, some words or finishing thoughts) or speech is not clear   Understanding Verbal and Non-Verbal Content Understanding Verbal and Non-Verbal Content: Usually understands - understands most conversations, but misses some part/intent of message. Requires cues at times to understand   Memory/Recall Ability *first 3 days only Memory/Recall Ability *first 3 days only: That he or she is in a hospital/hospital unit;Current season     Bedside Swallowing Assessment General Date of Onset: 12/30/20 Previous Swallow Assessment: unknown Diet Prior to this Study: Dysphagia 3 (soft);Thin liquids Behavior/Cognition: Alert;Pleasant mood Oral Cavity - Dentition: Edentulous Self-Feeding Abilities: Able  to feed self;Needs assist;Other (Comment) (needs supervision due to impulsivity with rate of consumption) Patient Positioning: Upright in bed Baseline Vocal Quality: Normal Volitional Cough: Strong Volitional Swallow: Able to elicit  Oral Care Assessment Does patient have any of the following "high(er) risk" factors?: None of the above Does patient have any of the following "at risk" factors?: Oxygen therapy - cannula, mask, simple oxygen devices Patient is AT RISK: Order set for Adult Oral Care Protocol initiated -  "At Risk Patients" option selected (see row information) Ice Chips Ice chips: Not tested Thin Liquid Thin Liquid: Within functional limits Presentation: Straw Nectar Thick Nectar Thick Liquid: Not tested Honey Thick Honey Thick Liquid: Not tested Puree Puree: Within functional limits Presentation: Self Fed Solid Solid: Impaired Oral Phase Impairments: Impaired mastication Oral Phase Functional Implications: Oral residue;Right lateral sulci pocketing;Left lateral sulci pocketing BSE Assessment Suspected Esophageal Findings Suspected Esophageal Findings: Globus sensation Risk for Aspiration Impact on safety and function: Mild aspiration risk;Other (comment);Moderate aspiration risk (impulsive and quick rate of consumption and large bolus volumes with limited ability to self monitor) Other Related Risk Factors: Cognitive impairment  Short Term Goals: Week 1: SLP Short Term Goal 1 (Week 1): Patient will be appropriately oriented to place, time, and situation with Min A verbal cues SLP Short Term Goal 2 (Week 1): Patient will demonstrate selective attention by attending to functional tasks with Min A verbal cues for re-direction SLP Short Term Goal 3 (Week 1): Patient will recall information discussed during therapy session via answering questions with Mod A verbal and visual cues SLP Short Term Goal 4 (Week 1): Patient will demonstrate the ability to adequately  self-monitor swallowing skills and perform appropriate compensatory techniques (reduce rate of consumption, small bolus volumes) to safely consume Dys 2 diet and thin liquids with Mod A verbal cues.  Refer to Care Plan for Long Term Goals  Recommendations for other services: None   Discharge Criteria: Patient will be discharged from SLP if patient refuses treatment 3 consecutive times without medical reason, if treatment goals not met, if there is a change in medical status, if patient makes no progress towards goals or if patient is discharged from hospital.  The above assessment, treatment plan, treatment alternatives and goals were discussed and mutually agreed upon: by patient and by family  Patty Sermons 01/06/2021, 5:36 PM

## 2021-01-07 DIAGNOSIS — I63031 Cerebral infarction due to thrombosis of right carotid artery: Secondary | ICD-10-CM

## 2021-01-07 LAB — GLUCOSE, CAPILLARY
Glucose-Capillary: 116 mg/dL — ABNORMAL HIGH (ref 70–99)
Glucose-Capillary: 144 mg/dL — ABNORMAL HIGH (ref 70–99)
Glucose-Capillary: 68 mg/dL — ABNORMAL LOW (ref 70–99)
Glucose-Capillary: 100 mg/dL — ABNORMAL HIGH (ref 70–99)
Glucose-Capillary: 148 mg/dL — ABNORMAL HIGH (ref 70–99)

## 2021-01-07 LAB — URINALYSIS, ROUTINE W REFLEX MICROSCOPIC
Bilirubin Urine: NEGATIVE
Glucose, UA: NEGATIVE mg/dL
Hgb urine dipstick: NEGATIVE
Ketones, ur: NEGATIVE mg/dL
Leukocytes,Ua: NEGATIVE
Nitrite: NEGATIVE
Protein, ur: NEGATIVE mg/dL
Specific Gravity, Urine: 1.018 (ref 1.005–1.030)
pH: 5 (ref 5.0–8.0)

## 2021-01-07 LAB — CBC
HCT: 27.8 % — ABNORMAL LOW (ref 36.0–46.0)
Hemoglobin: 8.8 g/dL — ABNORMAL LOW (ref 12.0–15.0)
MCH: 28.9 pg (ref 26.0–34.0)
MCHC: 31.7 g/dL (ref 30.0–36.0)
MCV: 91.1 fL (ref 80.0–100.0)
Platelets: 232 10*3/uL (ref 150–400)
RBC: 3.05 MIL/uL — ABNORMAL LOW (ref 3.87–5.11)
RDW: 14.3 % (ref 11.5–15.5)
WBC: 10 10*3/uL (ref 4.0–10.5)
nRBC: 0 % (ref 0.0–0.2)

## 2021-01-07 LAB — BASIC METABOLIC PANEL
Anion gap: 7 (ref 5–15)
BUN: 12 mg/dL (ref 8–23)
CO2: 23 mmol/L (ref 22–32)
Calcium: 8.7 mg/dL — ABNORMAL LOW (ref 8.9–10.3)
Chloride: 108 mmol/L (ref 98–111)
Creatinine, Ser: 0.69 mg/dL (ref 0.44–1.00)
GFR, Estimated: >60 mL/min (ref >60)
Glucose, Bld: 119 mg/dL — ABNORMAL HIGH (ref 70–99)
Potassium: 3.5 mmol/L (ref 3.5–5.1)
Sodium: 138 mmol/L (ref 135–145)

## 2021-01-07 LAB — MAGNESIUM: Magnesium: 1.4 mg/dL — ABNORMAL LOW (ref 1.7–2.4)

## 2021-01-07 LAB — IRON AND TIBC
Iron: 14 ug/dL — ABNORMAL LOW (ref 28–170)
Saturation Ratios: 6 % — ABNORMAL LOW (ref 10.4–31.8)
TIBC: 241 ug/dL — ABNORMAL LOW (ref 250–450)
UIBC: 227 ug/dL

## 2021-01-07 LAB — VITAMIN D 25 HYDROXY (VIT D DEFICIENCY, FRACTURES): Vit D, 25-Hydroxy: 34.24 ng/mL (ref 30–100)

## 2021-01-07 NOTE — Progress Notes (Signed)
Occupational Therapy Session Note  Patient Details  Name: Jo Larson MRN: [id number] Date of Birth: Nov 25, 1931  Today's Date: 01/07/2021 OT Individual Time: 1000-1050; 1430-1500 OT Individual Time Calculation (min): 50 min ; 30 min   Short Term Goals: Week 1:  OT Short Term Goal 1 (Week 1): Pt will complete LB dressing with mod assist. OT Short Term Goal 2 (Week 1): Pt will complete toilet transfer with min assist OT Short Term Goal 3 (Week 1): Pt will complete bathing with mod assist.  Skilled Therapeutic Interventions/Progress Updates:    First session:  Pt semi upright in bed; attempted access to video/audio interpreter via however unavailable during this session.  Pt able to follow gestural commands with max tactile cues to complete supine to sit and ambulate to bathroom using RW with hand over hand left digits to maintain grasp of handle as well as min assist for balance.  Pt required min assist for toilet transfer.  Pt needed max assist for clothing management and min assist to maintain balance for pericare in standing.  Pt required mod assist to ambulate from bathroom to w/c placed at bedside without AD due to pt having significant difficulty managing RW.  Pt donned clean brief and scrub pants with max assist. Stand to sit at w/c with min assist and repositioning pelvis posteriorly in chair requiring mod assist.  Call bell in reach, LUE supported with half lap tray, seat belt alarm on.  Second session:  Pt on [facility] with nursing upon OT arrival.  Audio interpretor present to assist with communication.  Pt complaining of discomfort in right hand and pointing to IV when attempting to use hand functionally.  Attempted use walker splint on left side of RW during sit<>stand transfer, however pt unable to attend to left side despite max multimodal cueing in order to successfully grasp handle even with donning of wrist strap, and hand slid off multiple times. Therefore completed sit<>stand and stand  pivot transfer with mod assist and HHA without AD. Pt required max assist for clothing mgt and min assist for pericare.  Pt indicating desire to return to bed.  Stand pivot to EOB with mod assist and HHA.  Upon sit to supine, pt leaned directly posteriorly despite cues to lay on left side.  Pt required max assist to reposition.  Pt able to bridge using BLE towards [facility] with increased time and max multimodal cues. Call bell in reach, bed alarm on.  Therapy Documentation Precautions:  Precautions Precautions: Fall Precaution Comments: Non-English speaking. Needs interpreter - [place] Restrictions Weight Bearing Restrictions: No   Therapy/Group: Individual Therapy  Amie Critchley 01/07/2021, 12:57 PM

## 2021-01-07 NOTE — Progress Notes (Addendum)
Hypoglycemic Event  CBG: 68 at 1617  Treatment: 4 oz juice/soda  Symptoms: None  Follow-up CBG: Time:1646 CBG Result:100  Possible Reasons for Event: Unknown  Comments/MD notified: protocol followed.     Jo Larson W Eliot Bencivenga

## 2021-01-07 NOTE — Progress Notes (Signed)
Physical Therapy Session Note  Patient Details  Name: Jo Larson MRN: [id number] Date of Birth: [birth year]  Today's Date: 01/07/2021 PT Individual Time: 1535-1620 PT Individual Time Calculation (min): 45 min   Short Term Goals: Week 1:  PT Short Term Goal 1 (Week 1): Pt will complete bed mobility with minA PT Short Term Goal 2 (Week 1): Pt will complete bed<>chair transfers with minA and LRAD PT Short Term Goal 3 (Week 1): Pt will ambulate 72f with minA and LRAD PT Short Term Goal 4 (Week 1): Pt will initiate stair training  Skilled Therapeutic Interventions/Progress Updates:   Pt received supine in bed and agreeable to PT. Unable locate [place] interpreter initially. PT communicating with pt via gestures initially. Supine>sit transfer with min assist at trunk. Sit<>stand from EOB to don pants with min-mod assist for balance and control of pants with RUE. [facility] with tactile facilitation to improve posture and safety with turning to sit. PT then able to obtain audio interpretuer for remainder of session.   Pt transported to day room Pt instruct in TUG with RW x 53 seconds and min assist-mod assist for balance and improved AD management in turn.   Stand pivot transfer to nustep with min assist from PT and RW. Nustep reciprocal movement training x 3 minutes with BUE/BLE. L hand stabilized with L hand support. Mod assist throughout to improve ROM, symmetry and to protect the L GH joint.   Pt returned to room and performed stand pivot  transfer to bed with min-mod assist and UE support on RW. Sit>supine completed with min assist at the LLE, and left supine in bed with call bell in reach and all needs met.         Therapy Documentation Precautions:  Precautions Precautions: Fall Precaution Comments: Non-English speaking. [place]eds interpreter - [place] Weight Bearing Restrictions: No  Vital Signs: Therapy Vitals Temp: 100.1 F (37.8  C) Temp Source: Oral Pulse Rate: 61 Resp: 18 BP: (!) 118/45 Patient Position (if appropriate): Lying Oxygen Therapy SpO2: 95 % O2 Device: Room Air Pain: denies    Therapy/Group: Individual Therapy  ALorie Phenix6/18/2022, 4:22 PM

## 2021-01-07 NOTE — Progress Notes (Signed)
Physical Therapy Session Note  Patient Details  Name: Jo Larson MRN: [id number] Date of Birth: 14-Sep-1931  Today's Date: 01/07/2021 PT Individual Time: 0802-0857 PT Individual Time Calculation (min): 55 min   Short Term Goals: Week 1:  PT Short Term Goal 1 (Week 1): Pt will complete bed mobility with minA PT Short Term Goal 2 (Week 1): Pt will complete bed<>chair transfers with minA and LRAD PT Short Term Goal 3 (Week 1): Pt will ambulate 38ft with minA and LRAD PT Short Term Goal 4 (Week 1): Pt will initiate stair training  Skilled Therapeutic Interventions/Progress Updates:  Patient supine in bed on entrance to room. Patient alert and finishing breakfast with NT in room. Online interpreting service used for [place] language auditory only interpreter and pt  agreeable to PT session. Using service does require extra time throughout session for back and forth. Patient denied pain during session.  Therapeutic Activity: Bed Mobility: Patient performed supine --> sit with Min A. VC/ tc required for technique/ effort. At end of session, pt requires Mod A for return to supine in order to bring BLE to bed surface.  Transfers: Patient performed STS with Min A to RW and with HHA throughout session. SPVT transfers using RW require Mod A for balance and step progression. Provided vc/ tc for step progression and safe hand progression.  Sitting balance good on EOB with feet supported. Attempted trunk stability and LUE motor control with resisted AP pressure/ tension with pt unable to determine requested movements through translator.   During standing balance challenges, pt demos good LLE strength with low confidence as to strength and motor control. Pulls at therapist for more support despite no evidence of buckling.   Gait Training:  Patient ambulated 25 feet using RW with Min/ Mod A and w/c follow for safety/ fatigue. Intermittent assist provided for L hand to walker handgrip. Provided vc/ tc for  maintaining good effort.  Patient supine  in bed at end of session with brakes locked, bed alarm set, and all needs within reach. RN arriving to take pt vitals. Grandson and dtr arriving with questions on pt's status and mobility. Info provided and request for pt clothing appropriate for therapy along with rubber soled shoes with good grip to floor.      Therapy Documentation Precautions:  Precautions Precautions: Fall Precaution Comments: Non-English speaking. Needs interpreter - [place] Restrictions Weight Bearing Restrictions: No Vital Signs: Therapy Vitals Pulse Rate: 74 BP: (!) 161/60 Patient Position (if appropriate): Lying Oxygen Therapy SpO2: 93 % O2 Device: Room Air  Therapy/Group: Individual Therapy  Loel Dubonnet PT, DPT 01/07/2021, 10:04 AM

## 2021-01-07 NOTE — Progress Notes (Signed)
PROGRESS NOTE   Subjective/Complaints:  Eating lunch with sup assist , needs CNA to assist, ate ~50% ROS- limited by language- appears comfortable  Objective:   DG Chest 2 View  Result Date: 01/05/2021 CLINICAL DATA:  87 year old female with shortness of breath. EXAM: CHEST - 2 VIEW COMPARISON:  Chest radiograph dated 09/03/2016. FINDINGS: Shallow inspiration. Mild chronic interstitial coarsening pitted minimal left lung base atelectasis. No focal consolidation. Small bilateral pleural effusions. No pneumothorax. Stable mild cardiomegaly. Atherosclerotic calcification of the aorta. Osteopenia with degenerative changes of the spine. No acute osseous pathology. IMPRESSION: Small bilateral pleural effusions and minimal bibasilar atelectasis. Electronically Signed   By: Elgie Collard M.D.   On: 01/05/2021 20:11   DG Abd 1 View  Result Date: 01/06/2021 CLINICAL DATA:  Abdominal pain. EXAM: ABDOMEN - 1 VIEW COMPARISON:  04/03/2013 FINDINGS: Gas is present in scattered loops of nondilated small and large bowel. No dilated loops of bowel are seen to suggest obstruction. Evaluation for intraperitoneal free air is limited on this supine study. An oblong dystrophic calcification projecting over the right lower quadrant is unchanged. Spondylosis and mild levoscoliosis are noted in the lumbar spine. IMPRESSION: Nonobstructed bowel gas pattern. Electronically Signed   By: Sebastian Ache M.D.   On: 01/06/2021 13:02   Recent Labs    01/06/21 0522 01/07/21 0642  WBC 13.4* 10.0  HGB 9.0* 8.8*  HCT 28.7* 27.8*  PLT 211 232    Recent Labs    01/06/21 0522 01/07/21 0642  NA 140 138  K 3.7 3.5  CL 108 108  CO2 26 23  GLUCOSE 130* 119*  BUN 16 12  CREATININE 0.73 0.69  CALCIUM 8.8* 8.7*     Intake/Output Summary (Last 24 hours) at 01/07/2021 1244 Last data filed at 01/07/2021 0818 Gross per 24 hour  Intake 240 ml  Output 200 ml  Net 40 ml          Physical Exam: Vital Signs Blood pressure (!) 161/60, pulse 74, temperature 98 F (36.7 C), temperature source Oral, resp. rate 19, height 5' (1.524 m), weight 74.4 kg, SpO2 93 %.  General: No acute distress Mood and affect are appropriate Heart: Regular rate and rhythm no rubs murmurs or extra sounds Lungs: Clear to auscultation, breathing unlabored, no rales or wheezes Abdomen: Positive bowel sounds, soft nontender to palpation, nondistended Extremities: No clubbing, cyanosis, or edema Skin: No evidence of breakdown, no evidence of rash Neurologic: Cranial nerves II through XII intact, motor strength is 5/5 in RIght and 4/5 left bilateral deltoid, bicep, tricep, grip, hip flexor, knee extensors, ankle dorsiflexor and plantar flexor Sensory exam normal sensation to light touch and proprioception in bilateral upper and lower extremities Cerebellar exam normal finger to nose to finger as well as heel to shin in bilateral upper and lower extremities Musculoskeletal: Full range of motion in all 4 extremities. No joint swelling    Musculoskeletal:    Cervical back: Normal range of motion and neck supple.    Skin:    Comments: Sacral foam in place- per nurse, for prevention  L hand IV- pressure dressing on L wrist (nurse says it's old)  Neurological:  Mental Status: She is easily aroused. She is lethargic.    Comments: Sleepy and tended to keep eyes closed. Flat affect with soft, slurred speech--edentulous with left facial weakness. Question bilateral visual field deficits. Left hemiplegia with sensory deficits and question apraxia v/s inattention. Not able to keep eyes open, so sleepy/sedated.    Psychiatric:    Comments: lethargic    Assessment/Plan: 1. Functional deficits which require 3+ hours per day of interdisciplinary therapy in a comprehensive inpatient rehab setting. Physiatrist is providing close team supervision and 24 hour management of active medical  problems listed below. Physiatrist and rehab team continue to assess barriers to discharge/monitor patient progress toward functional and medical goals  Care Tool:  Bathing              Bathing assist       Upper Body Dressing/Undressing Upper body dressing   What is the patient wearing?: Hospital gown only    Upper body assist Assist Level: Set up assist    Lower Body Dressing/Undressing Lower body dressing      What is the patient wearing?: Incontinence brief     Lower body assist Assist for lower body dressing: Maximal Assistance - Patient 25 - 49%     Toileting Toileting    Toileting assist Assist for toileting: Total Assistance - Patient < 25%     Transfers Chair/bed transfer  Transfers assist     Chair/bed transfer assist level: Moderate Assistance - Patient 50 - 74%     Locomotion Ambulation   Ambulation assist      Assist level: Moderate Assistance - Patient 50 - 74% Assistive device: Walker-rolling Max distance: 23ft   Walk 10 feet activity   Assist     Assist level: Moderate Assistance - Patient - 50 - 74% Assistive device: Walker-rolling   Walk 50 feet activity   Assist Walk 50 feet with 2 turns activity did not occur: Safety/medical concerns         Walk 150 feet activity   Assist Walk 150 feet activity did not occur: Safety/medical concerns         Walk 10 feet on uneven surface  activity   Assist Walk 10 feet on uneven surfaces activity did not occur: Safety/medical concerns         Wheelchair     Assist Will patient use wheelchair at discharge?: No   Wheelchair activity did not occur: N/A         Wheelchair 50 feet with 2 turns activity    Assist    Wheelchair 50 feet with 2 turns activity did not occur: N/A       Wheelchair 150 feet activity     Assist  Wheelchair 150 feet activity did not occur: N/A       Blood pressure (!) 161/60, pulse 74, temperature 98 F (36.7 C),  temperature source Oral, resp. rate 19, height 5' (1.524 m), weight 74.4 kg, SpO2 93 %.  Medical Problem List and Plan: 1.  L hemiparesis and visual deficits secondary to R frontoparietal infarcts/patchy infarcts             -patient may  shower             -ELOS/Goals: 14-21 days- Supervision to min A after exam  -Continue CIR  2.  Antithrombotics: -DVT/anticoagulation:  Pharmaceutical: Lovenox             -antiplatelet therapy: 3. Headaches/Pain Management: Will schedule tylenol qid to help with HA/Pain  4. Mood: LCSW to follow for evaluation and support. Team to provide ego support.              -antipsychotic agents: N/a 5. Neuropsych: This patient is possibly not capable of making decisions on her own behalf. 6. Skin/Wound Care: . Routine pressure relief measures.  7. Fluids/Electrolytes/Nutrition: Monitor I/O. Check lytes in am 8. HTN: Monitor BP tid--continue Norvasc, catapres and cozaar.             --will check orthostatic vitals as reporting dizziness with transitional movements Vitals:   01/07/21 0610 01/07/21 0859  BP: 134/63 (!) 161/60  Pulse: 82 74  Resp:    Temp:    SpO2:  93%   Labile . Cont to monitor  9. T2DM: Hgb A1c-. 8.8 and poorly controlled. Continue dose of levermir tonight --Will resume amaryl today and metformin tomorrow (48 hours post dye) -CBG (last 3)  Recent Labs    01/06/21 2114 01/07/21 0604 01/07/21 1119  GLUCAP 111* 116* 144*  Controlled 6/18  10. Hypoxia: Educated on pulmonary hygeine. --Question fluid overload--Grade 1 DD w/mild AVR/sclerosis.               --CXR ordered with daily weights for monitoring. 11.  Dyslipidemia: 12.  H/o Depression: Continue Zoloft. 13. Hypothyroid: Managed with supplementation.  14. Pt doesn't speak [place]- from [place]- daughter and grandson speak English-  15. Bradycardia: decreased amlodipine to 5mg  16. Abdominal pain: UA/UC ordered.  17. Diarrhea: senna/Miralax d/ced.  18. Leukocytosis:  resolved WBC down to 10K today .  UA -, afeb  19. Hypokalemia: supplement today and repeat tomorrow (I can supplement on Monday if still low).   LOS: 2 days A FACE TO FACE EVALUATION WAS PERFORMED  Sunday 01/07/2021, 12:44 PM

## 2021-01-07 NOTE — Progress Notes (Signed)
Toileted patient for urinalysis culture. Patient did not void. Consented to in and out cath for urine sample. Pt tolerated well. Urine sample acquired and sent to lab. Pt is resting in bed with call bell within reach.

## 2021-01-08 LAB — GLUCOSE, CAPILLARY
Glucose-Capillary: 129 mg/dL — ABNORMAL HIGH (ref 70–99)
Glucose-Capillary: 141 mg/dL — ABNORMAL HIGH (ref 70–99)
Glucose-Capillary: 113 mg/dL — ABNORMAL HIGH (ref 70–99)
Glucose-Capillary: 148 mg/dL — ABNORMAL HIGH (ref 70–99)

## 2021-01-08 LAB — URINE CULTURE: Culture: >=100000 — AB

## 2021-01-08 NOTE — Progress Notes (Signed)
PROGRESS NOTE   Subjective/Complaints: Used Designer, television/film set [place] language.  The patient did respond to the interpreter and follows some instructions however the interpreter said the patient was sometimes not making much sense and was confused.  Denies any pain complaints no breathing problems. Isolated borderline hypoglycemic event (68)  ROS- limited by language- appears comfortable  Objective:   DG Abd 1 View  Result Date: 01/06/2021 CLINICAL DATA:  Abdominal pain. EXAM: ABDOMEN - 1 VIEW COMPARISON:  04/03/2013 FINDINGS: Gas is present in scattered loops of nondilated small and large bowel. No dilated loops of bowel are seen to suggest obstruction. Evaluation for intraperitoneal free air is limited on this supine study. An oblong dystrophic calcification projecting over the right lower quadrant is unchanged. Spondylosis and mild levoscoliosis are noted in the lumbar spine. IMPRESSION: Nonobstructed bowel gas pattern. Electronically Signed   By: Sebastian Ache M.D.   On: 01/06/2021 13:02   Recent Labs    01/06/21 0522 01/07/21 0642  WBC 13.4* 10.0  HGB 9.0* 8.8*  HCT 28.7* 27.8*  PLT 211 232    Recent Labs    01/06/21 0522 01/07/21 0642  NA 140 138  K 3.7 3.5  CL 108 108  CO2 26 23  GLUCOSE 130* 119*  BUN 16 12  CREATININE 0.73 0.69  CALCIUM 8.8* 8.7*     Intake/Output Summary (Last 24 hours) at 01/08/2021 0718 Last data filed at 01/07/2021 1808 Gross per 24 hour  Intake 493 ml  Output --  Net 493 ml         Physical Exam: Vital Signs Blood pressure (!) 160/59, pulse 63, temperature 98.9 F (37.2 C), resp. rate 18, height 5' (1.524 m), weight 71.4 kg, SpO2 94 %.   General: No acute distress Mood and affect are appropriate Heart: Regular rate and rhythm no rubs murmurs or extra sounds Lungs: Clear to auscultation, breathing unlabored, no rales or wheezes Abdomen: Positive bowel  sounds, soft nontender to palpation, nondistended Extremities: No clubbing, cyanosis, or edema, no erythema in the left hand or arm Skin: No evidence of breakdown, no evidence of rash   Musculoskeletal:    Cervical back: Normal range of motion and neck supple.  Neurological:    Mental Status: She is awake and alert    Comments: Dysarthric Motor strength is 2/5 minus left upper extremity, 4/5 left lower extremity, 4/5 right upper and right lower extremity.  Psychiatric:    Comments: Became mildly agitated during motor exam but could be redirected.  Did not want her left arm to be touched.  Assessment/Plan: 1. Functional deficits which require 3+ hours per day of interdisciplinary therapy in a comprehensive inpatient rehab setting. Physiatrist is providing close team supervision and 24 hour management of active medical problems listed below. Physiatrist and rehab team continue to assess barriers to discharge/monitor patient progress toward functional and medical goals  Care Tool:  Bathing    Body parts bathed by patient: Left arm, Chest, Abdomen, Front perineal area, Buttocks, Right upper leg, Left upper leg, Face   Body parts bathed by helper: Right arm, Right lower leg, Left lower leg     Bathing assist Assist Level:  Moderate Assistance - Patient 50 - 74%     Upper Body Dressing/Undressing Upper body dressing   What is the patient wearing?: Pull over shirt    Upper body assist Assist Level: Moderate Assistance - Patient 50 - 74%    Lower Body Dressing/Undressing Lower body dressing      What is the patient wearing?: Incontinence brief, Pants     Lower body assist Assist for lower body dressing: Maximal Assistance - Patient 25 - 49%     Toileting Toileting    Toileting assist Assist for toileting: Maximal Assistance - Patient 25 - 49%     Transfers Chair/bed transfer  Transfers assist     Chair/bed transfer assist level: Moderate Assistance - Patient 50 -  74%     Locomotion Ambulation   Ambulation assist      Assist level: Minimal Assistance - Patient > 75% Assistive device: Walker-rolling Max distance: 58ft   Walk 10 feet activity   Assist     Assist level: Minimal Assistance - Patient > 75% Assistive device: Walker-rolling   Walk 50 feet activity   Assist Walk 50 feet with 2 turns activity did not occur: Safety/medical concerns         Walk 150 feet activity   Assist Walk 150 feet activity did not occur: Safety/medical concerns         Walk 10 feet on uneven surface  activity   Assist Walk 10 feet on uneven surfaces activity did not occur: Safety/medical concerns         Wheelchair     Assist Will patient use wheelchair at discharge?: No   Wheelchair activity did not occur: N/A         Wheelchair 50 feet with 2 turns activity    Assist    Wheelchair 50 feet with 2 turns activity did not occur: N/A       Wheelchair 150 feet activity     Assist  Wheelchair 150 feet activity did not occur: N/A       Blood pressure (!) 160/59, pulse 63, temperature 98.9 F (37.2 C), resp. rate 18, height 5' (1.524 m), weight 71.4 kg, SpO2 94 %.  Medical Problem List and Plan: 1.  L hemiparesis and visual deficits secondary to R frontoparietal infarcts/patchy infarcts             -patient may  shower             -ELOS/Goals: 14-21 days- Supervision to min A after exam  -Continue CIR  2.  Antithrombotics: -DVT/anticoagulation:  Pharmaceutical: Lovenox             -antiplatelet therapy: 3. Headaches/Pain Management: Will schedule tylenol qid to help with HA/Pain 4. Mood: LCSW to follow for evaluation and support. Team to provide ego support.              -antipsychotic agents: N/a 5. Neuropsych: This patient is possibly not capable of making decisions on her own behalf. 6. Skin/Wound Care: . Routine pressure relief measures.  7. Fluids/Electrolytes/Nutrition: Monitor I/O. Check lytes in  am 8. HTN: Monitor BP tid--continue Norvasc, catapres and cozaar.             --will check orthostatic vitals as reporting dizziness with transitional movements Vitals:   01/07/21 2004 01/08/21 0444  BP:  (!) 160/59  Pulse:  63  Resp: 18 18  Temp: 98.7 F (37.1 C) 98.9 F (37.2 C)  SpO2: 93% 94%   6/19 Labile .  Cont to monitor  9. T2DM: Hgb A1c-. 8.8 and poorly controlled. Continue dose of levermir tonight --Will resume amaryl today and metformin tomorrow (48 hours post dye) -CBG (last 3)  Recent Labs    01/07/21 1646 01/07/21 2054 01/08/21 0606  GLUCAP 100* 148* 129*   Controlled 6/19, had borderline hypoglycemic episode will not change therapy unless this recurs.  Otherwise well controlled  10. Hypoxia: Educated on pulmonary hygeine. --Question fluid overload--Grade 1 DD w/mild AVR/sclerosis.               --CXR ordered with daily weights for monitoring. 11.  Dyslipidemia: 12.  H/o Depression: Continue Zoloft. 13. Hypothyroid: Managed with supplementation.  14. Pt doesn't speak [place]- from [place]- daughter and grandson speak English-  15. Bradycardia: decreased amlodipine to 5mg  16. Abdominal pain: UA/UC ordered.  17. Diarrhea: senna/Miralax d/ced.  18. Leukocytosis: resolved WBC down to 10K today .  UA -, afeb  19. Hypokalemia: supplement today and repeat tomorrow (upplement on Monday if still low).   LOS: 3 days A FACE TO FACE EVALUATION WAS PERFORMED  Thursday 01/08/2021, 7:18 AM

## 2021-01-08 NOTE — IPOC Note (Signed)
Overall Plan of Care [facility]) Patient Details Name: Jo Larson MRN: [id number] DOB: Aug 05, 1931  Admitting Diagnosis: Stroke (cerebrum) [facility])  Hospital Problems: Principal Problem:   Stroke (cerebrum) (HCC) Active Problems:   Type 2 diabetes mellitus without complication, without long-term current use of insulin (HCC)     Functional Problem List: Nursing Bladder, Medication Management, Safety, Bowel, Pain, Endurance  PT Balance, Endurance, Motor, Nutrition, Pain, Safety, Sensory, Perception, Skin Integrity  OT Balance, Perception, Safety, Cognition, Sensory, Endurance, Motor, Pain, Vision  SLP Cognition  TR         Basic ADL's: OT Grooming, Bathing, Dressing, Toileting     Advanced  ADL's: OT       Transfers: PT Bed Mobility, Bed to Chair, Customer service manager, Tub/Shower     Locomotion: PT Ambulation, Stairs     Additional Impairments: OT Fuctional Use of Upper Extremity  SLP Swallowing      TR      Anticipated Outcomes Item Anticipated Outcome  Self Feeding    Swallowing  Supervision   Basic self-care     Toileting      Bathroom Transfers    Bowel/Bladder  manage bowel with mod I and bladder with min assist  Transfers  supervision with LRAD  Locomotion  CGA with LRAD  Communication     Cognition  Supervision  Pain  at or below level 4  Safety/Judgment  maintain safety with cues /reminders   Therapy Plan: PT Intensity: Minimum of 1-2 x/day ,45 to 90 minutes PT Frequency: 5 out of 7 days PT Duration Estimated Length of Stay: 2-3 weeks OT Intensity: Minimum of 1-2 x/day, 45 to 90 minutes OT Frequency: 5 out of 7 days OT Duration/Estimated Length of Stay: 2-3 weeks SLP Intensity: Minumum of 1-2 x/day, 30 to 90 minutes SLP Frequency: 3 to 5 out of 7 days SLP Duration/Estimated Length of Stay: 2-3 weeks   Due to the current state of emergency, patients may not be receiving their 3-hours of Medicare-mandated therapy.   Team Interventions: Nursing  Interventions Patient/Family Education, Bowel Management, Pain Management, Discharge Planning, Medication Management, Disease Management/Prevention, Bladder Management  PT interventions Ambulation/gait training, Cognitive remediation/compensation, Warden/ranger, Community reintegration, Discharge planning, Disease management/prevention, DME/adaptive equipment instruction, Functional electrical stimulation, Functional mobility training, Neuromuscular re-education, Pain management, Patient/family education, Psychosocial support, Skin care/wound management, Splinting/orthotics, Stair training, Therapeutic Activities, Therapeutic Exercise, UE/LE Strength taining/ROM, UE/LE Coordination activities, Visual/perceptual remediation/compensation, Wheelchair propulsion/positioning  OT Interventions Balance/vestibular training, Discharge planning, Functional electrical stimulation, Pain management, Self Care/advanced ADL retraining, Therapeutic Activities, UE/LE Coordination activities, Cognitive remediation/compensation, Disease mangement/prevention, Functional mobility training, Patient/family education, Therapeutic Exercise, Visual/perceptual remediation/compensation, DME/adaptive equipment instruction, Neuromuscular re-education, Psychosocial support, UE/LE Strength taining/ROM, Wheelchair propulsion/positioning  SLP Interventions Cognitive remediation/compensation, Environmental controls, Cueing hierarchy, Dysphagia/aspiration precaution training, Functional tasks, Patient/family education, Therapeutic Activities  TR Interventions    SW/CM Interventions Discharge Planning, Psychosocial Support, Patient/Family Education   Barriers to Discharge MD  Medical stability  Nursing Decreased caregiver support, Home environment access/layout, Incontinence 1 level 4 ste with daughter in law and grandchildren  PT Decreased caregiver support, Community education officer for SNF coverage, Home environment access/layout, Lack  of/limited family support, Weight    OT      SLP      SW Other (comments) language barrier   Team Discharge Planning: Destination: PT-Home ,OT- Home , SLP-Home Projected Follow-up: PT-Home health PT, 24 hour supervision/assistance, OT-  24 hour supervision/assistance, SLP-24 hour supervision/assistance Projected Equipment Needs: PT-To be determined, OT- To be determined, SLP-None  recommended by SLP Equipment Details: PT-Pt owns no DME, OT-  Patient/family involved in discharge planning: PT- Patient,  OT-Patient, SLP-Patient, Family member/caregiver  MD ELOS: 14 to 21 days Medical Rehab Prognosis:  Good Assessment: 87 year old female with history of CAD, T2DM, HTN, depression/anxiety who was admitted on 12/30/20 with progressive LUE weakness X few days. MRI/MRA brain done revealing patchy acute infarcts in right frontoparietal lobes and minimal right occipital lobe involvement and no intracranial stenosis.  2 D echo done revealing EF 60-65% with moderate concentric LVH, aortic sclerosis with mild AVR. Neurology recommended DAPT X 3 weeks followed by ASA alone. CTA head/neck ordered for work up on 06/13 and showed 80% stenosis in right carotid bulb and 1-2 mm outpouching from left M1/MCA segment at origin lenticulostriate artery s/o small aneurysm v/s infundibulum.     She was evaluated by Dr. Bryon Lions and underwent diagnostic cerebral angiogram with right carotid stenting on 06/14. Post procedure CT without evidence of bleed. Post procedure to continue DAPT X 3 months (will need carotid ultrasound for follow up).  She continues to be limited by left sided weakness with sensory deficits, visual deficits, cognitive deficits with delay in processing and possibly motor planning    See Team Conference Notes for weekly updates to the plan of care

## 2021-01-09 LAB — GLUCOSE, CAPILLARY
Glucose-Capillary: 158 mg/dL — ABNORMAL HIGH (ref 70–99)
Glucose-Capillary: 132 mg/dL — ABNORMAL HIGH (ref 70–99)
Glucose-Capillary: 80 mg/dL (ref 70–99)
Glucose-Capillary: 112 mg/dL — ABNORMAL HIGH (ref 70–99)

## 2021-01-09 MED ORDER — MAGNESIUM SULFATE 2 GM/50ML IV SOLN
2.0000 g | Freq: Once | INTRAVENOUS | Status: AC
  Administered 2021-01-09: 2 g via INTRAVENOUS
  Filled 2021-01-09: qty 50

## 2021-01-09 MED ORDER — POTASSIUM CHLORIDE 20 MEQ PO PACK
40.0000 meq | PACK | Freq: Once | ORAL | Status: AC
  Administered 2021-01-09: 40 meq via ORAL
  Filled 2021-01-09: qty 2

## 2021-01-09 NOTE — Progress Notes (Signed)
Physical Therapy Session Note  Patient Details  Name: Jo Larson MRN: [id number] Date of Birth: 20-Feb-1932  Today's Date: 01/09/2021 PT Individual Time: 0800-0900 + 1500-1525 PT Individual Time Calculation (min): 60 min  + 25 min  Short Term Goals: Week 1:  PT Short Term Goal 1 (Week 1): Pt will complete bed mobility with minA PT Short Term Goal 2 (Week 1): Pt will complete bed<>chair transfers with minA and LRAD PT Short Term Goal 3 (Week 1): Pt will ambulate 11f with minA and LRAD PT Short Term Goal 4 (Week 1): Pt will initiate stair training  Skilled Therapeutic Interventions/Progress Updates:     1st session: Pt received supine in bed to start PT session. Pt noted to have a heavily saturated brief with loose BM. NT present to assist with cleanup. Pt able to roll in bed with minA and requierd totalA for pericare and brief change. No in-person interpreter available - used [employer] video/audio interpreter via iPad. Pt required modA for lower body and upper body dressing with disposable hospital clothes. She required modA for supine<>sit with assist primarily for trunk elevation. Able to sit unsupported with SBA at EOB. Completed stand<>pivot transfer with modA and no AD from EOB to w/c with cues needed for sequencing, safety appraoch. She has great difficulty repositioning herself in the w/c with cues only, needs modA. W/c transport for time management to ortho rehab gym. Completed stand<>pivot with modA to mat table with similar fashion. She completed 1x10 sit<>stand transfers to RW with minA overall and then completed 1x10 LAQ with 3LB weight and 1x10 hip marches with 3LB weight to LLE. Next, she ambulated ~230fwith min/modA and RW but needs a heavy modA for turning to sit in the armchair - again having difficulty with repositioning. Completed stand<>pivot with modA back to her w/c and then was wheeled back to her room. Once returned to room, she gestured need to use the toilet - notified NT  to assist due to time constraints, handoff of care to NT in room. All needs met. Extra time needed throughout session due to language barrier, motor apraxia.   2nd session: Pt received supine in bed to start PT tx - RN at bedside finishing up lab draw. No interpreter or family at bedside - used [employer] interpreter throughout session. Pt needs convincing to participate in PT session - reports "she's done a lot of walking today." (Of note, only walked ~2560fn AM with PT). Ultimately, pt agreeable. Able to complete supine<>sit with supervision with use of bed features and bed rail. Able to sit unsupported with supervision. She ambulated 2x25f57from EOB to door and back) with seated rest break and minA. She needs cues for maintaining body within walker frame and assist for LUE grip despite RW splint.  She also demonstrates poor safety awareness with RW management while turning to sit as she doesn't bring in back with her. When discussing DC planning during setaed rest break, pt with flat affect and difficult to get information (for example, when asked how many steps or if she has steps at home, she reports she doesn't know because sh didn't count). Pt requesting to lay back in bed, completed sit>supine with CGA with use of bed features and needs mod cues for symmetrical repositioning in bed. She remained sidelying at end of session with bed alarm on and needs in reach.    Therapy Documentation Precautions:  Precautions Precautions: Fall Precaution Comments: Non-English speaking. Needs interpreter - [place] Weight Bearing Restrictions:  No General:     Therapy/Group: Individual Therapy  Burnell Hurta P Neyla Gauntt PT 01/09/2021, 7:44 AM

## 2021-01-09 NOTE — Progress Notes (Signed)
Occupational Therapy Session Note  Patient Details  Name: Jo Larson MRN: [id number] Date of Birth: 1932/01/03  Today's Date: 01/09/2021 OT Individual Time: 1030-1120 OT Individual Time Calculation (min): 50 min  and Today's Date: 01/09/2021 OT Missed Time: 10 Minutes Missed Time Reason: Patient unwilling/refused to participate without medical reason;Patient fatigue   Short Term Goals: Week 1:  OT Short Term Goal 1 (Week 1): Pt will complete LB dressing with mod assist. OT Short Term Goal 2 (Week 1): Pt will complete toilet transfer with min assist OT Short Term Goal 3 (Week 1): Pt will complete bathing with mod assist.  Skilled Therapeutic Interventions/Progress Updates:    Pt received in wc.  Used [employer] interpretor video to communicate with pt.  Pt in hospital scrubs and did not want to shower, bathe or change clothing. (She had already been cleaned up by nursing after an accident). Discussed with pt to plan a shower tomorrow.    Had pt stand with RW to ambulate to arm chair in room. Mod A with managing walker and mod cues with body postioning as pt would sit down too quickly.  In arm chair worked on various LUE a/arom exercises. Pt has 3-/5 in shoulder and 3/5 in distal arm.  Pt stated she enjoys crocheting and making hats for children.   Pt continually c/o fatigue and wanting to go to bed.  Had pt walk to bed with mod cues and min A for safe positioning.  Pt needed max A to move into a symmetrical laying position as she had just "collapsed" onto the bed. She then opted to move to L sidelying. Assisted pt with positioning with pillows. Bed alarm set and all needs met.   Therapy Documentation Precautions:  Precautions Precautions: Fall Precaution Comments: Non-English speaking. Needs interpreter - [place] Restrictions Weight Bearing Restrictions: No  Pain: no c/o pain   ADL: ADL Toileting: Maximal assistance Where Assessed-Toileting: Bed level   Therapy/Group:  Individual Therapy  Interior 01/09/2021, 8:52 AM

## 2021-01-09 NOTE — Progress Notes (Signed)
PROGRESS NOTE   Subjective/Complaints: Used interpreter C/o of tightness in upper back- Julia massaging and heaing pad ordered Limited ability to follow commands  ROS- limited by language- appears comfortable  Objective:   No results found. Recent Labs    01/07/21 0642  WBC 10.0  HGB 8.8*  HCT 27.8*  PLT 232   Recent Labs    01/07/21 0642  NA 138  K 3.5  CL 108  CO2 23  GLUCOSE 119*  BUN 12  CREATININE 0.69  CALCIUM 8.7*    Intake/Output Summary (Last 24 hours) at 01/09/2021 1054 Last data filed at 01/09/2021 0827 Gross per 24 hour  Intake 560 ml  Output --  Net 560 ml        Physical Exam: Vital Signs Blood pressure (!) 165/57, pulse (!) 59, temperature 98.7 F (37.1 C), temperature source Oral, resp. rate 20, height 5' (1.524 m), weight 70.2 kg, SpO2 98 %. Gen: no distress, normal appearing HEENT: oral mucosa pink and moist, NCAT Cardio: Bradycardia Chest: normal effort, normal rate of breathing Abd: soft, non-distended Ext: no edema Psych: pleasant, normal affect Skin: intact   Musculoskeletal:    Cervical back: Normal range of motion and neck supple.  Neurological:    Mental Status: She is awake and alert    Comments: Dysarthric Motor strength is 2/5 minus left upper extremity, 4/5 left lower extremity, 4/5 right upper and right lower extremity.  Psychiatric:    Comments: Became mildly agitated during motor exam but could be redirected.  Did not want her left arm to be touched.  Assessment/Plan: 1. Functional deficits which require 3+ hours per day of interdisciplinary therapy in a comprehensive inpatient rehab setting. Physiatrist is providing close team supervision and 24 hour management of active medical problems listed below. Physiatrist and rehab team continue to assess barriers to discharge/monitor patient progress toward functional and medical goals  Care Tool:  Bathing    Body  parts bathed by patient: Left arm, Chest, Abdomen, Front perineal area, Buttocks, Right upper leg, Left upper leg, Face   Body parts bathed by helper: Right arm, Right lower leg, Left lower leg     Bathing assist Assist Level: Moderate Assistance - Patient 50 - 74%     Upper Body Dressing/Undressing Upper body dressing   What is the patient wearing?: Pull over shirt    Upper body assist Assist Level: Moderate Assistance - Patient 50 - 74%    Lower Body Dressing/Undressing Lower body dressing      What is the patient wearing?: Incontinence brief, Pants     Lower body assist Assist for lower body dressing: Maximal Assistance - Patient 25 - 49%     Toileting Toileting    Toileting assist Assist for toileting: Maximal Assistance - Patient 25 - 49%     Transfers Chair/bed transfer  Transfers assist     Chair/bed transfer assist level: Moderate Assistance - Patient 50 - 74%     Locomotion Ambulation   Ambulation assist      Assist level: Minimal Assistance - Patient > 75% Assistive device: Walker-rolling Max distance: 71ft   Walk 10 feet activity   Assist  Assist level: Minimal Assistance - Patient > 75% Assistive device: Walker-rolling   Walk 50 feet activity   Assist Walk 50 feet with 2 turns activity did not occur: Safety/medical concerns         Walk 150 feet activity   Assist Walk 150 feet activity did not occur: Safety/medical concerns         Walk 10 feet on uneven surface  activity   Assist Walk 10 feet on uneven surfaces activity did not occur: Safety/medical concerns         Wheelchair     Assist Will patient use wheelchair at discharge?: No   Wheelchair activity did not occur: N/A         Wheelchair 50 feet with 2 turns activity    Assist    Wheelchair 50 feet with 2 turns activity did not occur: N/A       Wheelchair 150 feet activity     Assist  Wheelchair 150 feet activity did not occur:  N/A       Blood pressure (!) 165/57, pulse (!) 59, temperature 98.7 F (37.1 C), temperature source Oral, resp. rate 20, height 5' (1.524 m), weight 70.2 kg, SpO2 98 %.  Medical Problem List and Plan: 1.  L hemiparesis and visual deficits secondary to R frontoparietal infarcts/patchy infarcts             -patient may  shower             -ELOS/Goals: 14-21 days- Supervision to min A after exam  -Continue CIR  2.  Antithrombotics: -DVT/anticoagulation:  Pharmaceutical: Lovenox             -antiplatelet therapy: 3. Headaches/Pain Management: Will schedule tylenol qid to help with HA/Pain 4. Mood: LCSW to follow for evaluation and support. Team to provide ego support.              -antipsychotic agents: N/a 5. Neuropsych: This patient is possibly not capable of making decisions on her own behalf. 6. Skin/Wound Care: . Routine pressure relief measures.  7. Fluids/Electrolytes/Nutrition: Monitor I/O. Check lytes in am 8. HTN: Monitor BP tid--continue Norvasc, catapres and cozaar.             --will check orthostatic vitals as reporting dizziness with transitional movements Vitals:   01/08/21 1924 01/09/21 0426  BP: (!) 162/91 (!) 165/57  Pulse: 75 (!) 59  Resp: 20 20  Temp: 99 F (37.2 C) 98.7 F (37.1 C)  SpO2: 95% 98%   6/19 Labile . Cont to monitor  9. T2DM: Hgb A1c-. 8.8 and poorly controlled. Continue dose of levermir tonight --Will resume amaryl today and metformin tomorrow (48 hours post dye) -CBG (last 3)  Recent Labs    01/08/21 1650 01/08/21 2104 01/09/21 0607  GLUCAP 113* 148* 158*  6/20: CBGs slightly elevated  10. Hypoxia: Educated on pulmonary hygeine. --Question fluid overload--Grade 1 DD w/mild AVR/sclerosis.               --CXR ordered with daily weights for monitoring. 11.  Dyslipidemia: 12.  H/o Depression: Continue Zoloft. 13. Hypothyroid: Managed with supplementation.  14. Pt doesn't speak [place]- from [place]- daughter and grandson speak  English-  15. Bradycardia: decreased amlodipine to 5mg  16. Abdominal pain: UA/UC ordered.  17. Diarrhea: senna/Miralax d/ced.  18. Leukocytosis: resolved WBC down to 10K today .  UA -, afeb 19. Hypokalemia: supplement today, edited diet order to request fresh fruit daily (she likes bananas), repeat tomorrow.  20. Cervical myofascial pain: appreciate massage by Lendell Caprice ordered. 21. Hypomagnesemia: supplement 1.8mg  IV today, and repeat tomorrow.   LOS: 4 days A FACE TO FACE EVALUATION WAS PERFORMED  Drema Pry Corey Caulfield 01/09/2021, 10:54 AM

## 2021-01-09 NOTE — Progress Notes (Signed)
Speech Language Pathology Daily Session Note  Patient Details  Name: Jo Larson MRN: [id number] Date of Birth: 22-Mar-1932  Today's Date: 01/09/2021 SLP Individual Time: 0930-1015 SLP Individual Time Calculation (min): 45 min  Short Term Goals: Week 1: SLP Short Term Goal 1 (Week 1): Patient will be appropriately oriented to place, time, and situation with Min A verbal cues SLP Short Term Goal 2 (Week 1): Patient will demonstrate selective attention by attending to functional tasks with Min A verbal cues for re-direction SLP Short Term Goal 3 (Week 1): Patient will recall information discussed during therapy session via answering questions with Mod A verbal and visual cues SLP Short Term Goal 4 (Week 1): Patient will demonstrate the ability to adequately self-monitor swallowing skills and perform appropriate compensatory techniques (reduce rate of consumption, small bolus volumes) to safely consume Dys 2 diet and thin liquids with Mod A verbal cues.  Skilled Therapeutic Interventions: Skilled SLP treatment performed with focus on communication goals. SLP utilized video interpretation service for translation. Translator reported patient exhibited increased difficulty comprehending questions and requiring re-phrasing and verbal repetition to comprehend which was successful approximately 50% of attempts. Patient was oriented to person, and place (i.e., hospital, [facility] [place]). Required Max cues for situation and time. SLP facilitated object matching task by color to promote sustained attention. Patient was able to perform for 8 minutes with Mod A verbal and visual cues to initiate and persist to task. Assisted patient with set-up and supervision with oral care. Light brownish coating noted on lingual surface. Notified nurse. Patient was left in her wheelchair with alarm activated and needs within reach. Continue with current plan of care.  Pain Pain Assessment Pain Scale: 0-10 Pain Score: 0-No  pain  Therapy/Group: Individual Therapy  [name] 01/09/2021, 12:14 PM

## 2021-01-10 LAB — BASIC METABOLIC PANEL
Anion gap: 13 (ref 5–15)
BUN: 11 mg/dL (ref 8–23)
CO2: 23 mmol/L (ref 22–32)
Calcium: 9.3 mg/dL (ref 8.9–10.3)
Chloride: 105 mmol/L (ref 98–111)
Creatinine, Ser: 0.76 mg/dL (ref 0.44–1.00)
GFR, Estimated: >60 mL/min (ref >60)
Glucose, Bld: 129 mg/dL — ABNORMAL HIGH (ref 70–99)
Potassium: 4.1 mmol/L (ref 3.5–5.1)
Sodium: 141 mmol/L (ref 135–145)

## 2021-01-10 LAB — GLUCOSE, CAPILLARY
Glucose-Capillary: 140 mg/dL — ABNORMAL HIGH (ref 70–99)
Glucose-Capillary: 169 mg/dL — ABNORMAL HIGH (ref 70–99)
Glucose-Capillary: 89 mg/dL (ref 70–99)
Glucose-Capillary: 166 mg/dL — ABNORMAL HIGH (ref 70–99)

## 2021-01-10 LAB — MAGNESIUM: Magnesium: 1.6 mg/dL — ABNORMAL LOW (ref 1.7–2.4)

## 2021-01-10 MED ORDER — NYSTATIN 100000 UNIT/ML MT SUSP
5.0000 mL | Freq: Four times a day (QID) | OROMUCOSAL | Status: DC
  Administered 2021-01-10 – 2021-01-20 (×38): 500000 [IU] via ORAL
  Filled 2021-01-10 (×39): qty 5

## 2021-01-10 MED ORDER — MAGNESIUM SULFATE 2 GM/50ML IV SOLN
2.0000 g | Freq: Once | INTRAVENOUS | Status: AC
  Administered 2021-01-10: 2 g via INTRAVENOUS
  Filled 2021-01-10: qty 50

## 2021-01-10 NOTE — Patient Care Conference (Signed)
Inpatient RehabilitationTeam Conference and Plan of Care Update Date: 01/10/2021   Time: 13:09 PM    Patient Name: Jo Larson      Medical Record Number: [id number]  Date of Birth: 08-Oct-1931 Sex: Female         Room/Bed: 4W25C/4W25C-01 Payor Info: Payor: Advertising copywriter MEDICARE / Plan: UHC MEDICARE / Product Type: *No Product type* /    Admit Date/Time:  01/05/2021  3:24 PM  Primary Diagnosis:  Stroke (cerebrum) [facility])  Hospital Problems: Principal Problem:   Stroke (cerebrum) (HCC) Active Problems:   Type 2 diabetes mellitus without complication, without long-term current use of insulin [facility])    Expected Discharge Date: Expected Discharge Date: 01/20/21  Team Members Present: Physician leading conference: Dr. Sula Soda Care Coodinator Present: Chana Bode, RN, BSN, CRRN;Becky Dupree, LCSW Nurse Present: Chana Bode, RN PT Present: Wynelle Link, PT OT Present: Towanda Malkin, OT PPS Coordinator present : Edson Snowball, PT     Current Status/Progress Goal Weekly Team Focus  Bowel/Bladder   Patient is continent/incontinent of urine , LBM6/20/22  Maintain regulat/normal contnence pattern  Time toileting Q2 hrs/prn assess QS/PRN toileting needs   Swallow/Nutrition/ Hydration   dys 2 textures and thin liquids, max A slow rate/oral clearance  Supervision A  swallow strategies   ADL's   mod A with transfers and ADLs due to L hemiparesis, low endurance, decreased balance, L inattention  CGA to Min A overall  ADL training, LUE NMR, balance, pt/family education   Mobility   minA bed mobility, min/modA stand<>pivot transfers, gait up to 63ft with minA and RW.  CGA to minA overall  Pt education, functional transfers, LLE NMR, gait training, initiate stair training, endurance and activity tolerance   Communication             Safety/Cognition/ Behavioral Observations  Min A orientation, Mod A increase comprehenison  Min-Supervision A,  orientation, recall and  selective attention   Pain             Skin               Discharge Planning:  HOme with family who will provide 24/7 care, daughter in-law here during the day while others work   Team Discussion: Thrush in mouth addressed. Labile BP monitored. MD added potassium and magnesium supplements. Education on dietary modifications. Incontinence of bowel and bladder; question language issue vs awareness; call for assistance. Patient is slow to move and has difficulty with language barrier as limited access to interpreters and family not in for therapy. Also note weakness on left side.   Patient on target to meet rehab goals: Currently min - mod assist for stand pivot and able to ambulate 25' with min assist. CGA goals set for discharge for OT and PT. Currently requires min cues for orientation and follows basic commands. Min assist goals set for SLP.  *See Care Plan and progress notes for long and short-term goals.   Revisions to Treatment Plan:  Working on cues to clear oral cavity during eating , problem solving, memory, attention, orientation and following basic commands  Teaching Needs: Safety, toileting, transfers, medications, cues recommended, etc.  Current Barriers to Discharge: Decreased caregiver support and Home enviroment access/layout Daughter in law works second shift; grand son works day shift  Possible Resolutions to Barriers: Family education     Medical Summary Current Status: thrush, left sided weakness, myofascial pain, hypokalemia, hypomagnesemia, BP very labile, dysphagia  Barriers to Discharge: Medical stability  Barriers to Discharge  Comments: thrush, left sided weakness, myofascial pain, hypokalemia, hypomagnesemia, BP very labile, dysphagia Possible Resolutions to [employer] Focus: start nystatin, continue potassium and magnesium supplementation, continue massage, continue clonidine, continue SLP   Continued Need for Acute Rehabilitation Level of Care:  The patient requires daily medical management by a physician with specialized training in physical medicine and rehabilitation for the following reasons: Direction of a multidisciplinary physical rehabilitation program to maximize functional independence : Yes Medical management of patient stability for increased activity during participation in an intensive rehabilitation regime.: Yes Analysis of laboratory values and/or radiology reports with any subsequent need for medication adjustment and/or medical intervention. : Yes   I attest that I was present, lead the team conference, and concur with the assessment and plan of the team.   Pamelia Hoit 01/10/2021, 5:22 PM

## 2021-01-10 NOTE — Progress Notes (Signed)
Physical Therapy Session Note  Patient Details  Name: Jo Larson MRN: [id number] Date of Birth: 1931/08/30  Today's Date: 01/10/2021 PT Individual Time: 4174-0814 PT Individual Time Calculation (min): 72 min   Short Term Goals: Week 1:  PT Short Term Goal 1 (Week 1): Pt will complete bed mobility with minA PT Short Term Goal 2 (Week 1): Pt will complete bed<>chair transfers with minA and LRAD PT Short Term Goal 3 (Week 1): Pt will ambulate 32ft with minA and LRAD PT Short Term Goal 4 (Week 1): Pt will initiate stair training  Skilled Therapeutic Interventions/Progress Updates:     Pt received supine in bed, sleeping soundly but awakens to voice and appears agreeable to PT tx. No in person interpreter at bedside and per conversation with OT, pt did well with gestures. Supine<>sit completed with minA with use of bed features. She completed stand<>pivot transfer with min/modA to w/c with poor safety awareness and difficulty clearing hips over w/c arm rests... w/c transport to day room rehab gym for time management.   Completed sit<>stand to RW with CGA with assist for strapping LUE into RW splint. She ambulated ~75ft + ~41ft with min/modA and RW - pt with poor ability to manage RW and produce adequate step length with LLE. She push's the walker L with her stronger R hand and lacks awareness to correct. She also has difficulty keeping body within walker frame despite max cues. She required modA for safety approach to sitting surfaces to to premature sitting prior to being fully over the surface.   Instructed in Nustep for endurance and muscle strengthening, she completed just over 7 minutes at workload of 2, using BLE's only. She appeared to gesture fatigue, SpO2 reading 98% and HR 72.   Attempted to engage and instruct her in alternating toe taps to 4inch platform with RW support however pt had difficulty following instructions - unclear if related to language barrier vs motor planning  difficulties from her CVA. Provided multiple forms of instruction such as verbal and visual with demonstrations. Ultimately, she was able to complete a few repetitions and sets of this with minA guard.  Pt assisted in supine position to mat table with minA. She was instructed on the following supine there-ex: -LLE short-arc quads with 4# ankle weight and pillow bolster under knees -RLE SLR, unweighted -LLE straight leg hip abduction, unweighted -LLE knee-to-chest leg press, unweighted (AAROM) **pt also had trouble following sequencing and instruction for there-ex, unsure language barrier vs motor planning.   Pt gesturing at this point of the session that she wanted to return to her room. Assisted supine<>sit with modA for trunk and BLE management. Completed stand<>pivot transfer with modA from mat table to w/c with poor safety awareness noted. Wheeled back to her room and completed stand<>pivot with minA to the EOB. She required modA for sit>supine and for repositioning in bed. With HOB in trendelenburg, required totalA for supine scooting. She remained semi-reclined in bed with needs in reach and bed alarm set at end of session.   Therapy Documentation Precautions:  Precautions Precautions: Fall Precaution Comments: Non-English speaking. Needs interpreter - [place] Restrictions Weight Bearing Restrictions: No General:     Therapy/Group: Individual Therapy  Orrin Brigham 01/10/2021, 7:37 AM

## 2021-01-10 NOTE — Progress Notes (Signed)
Occupational Therapy Session Note  Patient Details  Name: Jo Larson MRN: [id number] Date of Birth: [birth year]  Today's Date: 01/10/2021 OT Individual Time: 0945-1100 OT Individual Time Calculation (min): 75 min    Short Term Goals: Week 1:  OT Short Term Goal 1 (Week 1): Pt will complete LB dressing with mod assist. OT Short Term Goal 2 (Week 1): Pt will complete toilet transfer with min assist OT Short Term Goal 3 (Week 1): Pt will complete bathing with mod assist.  Skilled Therapeutic Interventions/Progress Updates:    Pt received in bed, she had just been cleaned up by nursing due to incontinent bowel episode.  No interpretor present. Today, instead of using video interpretor (pt HOH and it is challenging to use with her), used visual cues. Pt did fairly well with this.  At start of session, pt able to move more fluidly but she does fatigue quickly. For example, she ambulated with min A and mod A to guide RW to walk from bed to toilet. She had stood up from bed with CGA. By end of session, she was doing mod A stand pivots as she would just "flop" down, barely making it to the wc.   With visual cues, guided pt to toilet and then to shower and back to toilet to dress. Overall she was mod A with mobility and mod A with self care.  She has decrease LUE AROM and balance along with L inattention.  Pt taken to day room, to work on a/arom of LUE with towel slides on table and UE ranger.  Tip pinch to stack soft legos with mod A.  Provided with soft yellow block to squeeze.    Pt returned to room and transferred back to bed.  Resting in bed with all needs met. Alarm set.   Therapy Documentation Precautions:  Precautions Precautions: Fall Precaution Comments: Non-English speaking. Needs interpreter - [place] Restrictions Weight Bearing Restrictions: No      Pain: Pain Assessment Pain Scale: 0-10 Pain Score: 0-No pain ADL: ADL Eating: Set up Grooming: Setup Where  Assessed-Grooming: Sitting at sink Upper Body Bathing: Minimal assistance Where Assessed-Upper Body Bathing: Shower Lower Body Bathing: Moderate assistance Where Assessed-Lower Body Bathing: Shower Upper Body Dressing: Minimal assistance Where Assessed-Upper Body Dressing: Chair Lower Body Dressing: Maximal assistance Where Assessed-Lower Body Dressing: Chair Toileting: Maximal assistance Where Assessed-Toileting: Glass blower/designer: Moderate assistance Toilet Transfer Method: Stand pivot Toilet Transfer Equipment: Energy manager: Moderate assistance Social research officer, government Method: Radiographer, therapeutic: Radio broadcast assistant, Grab bars   Therapy/Group: Individual Therapy  Kief 01/10/2021, 11:03 AM

## 2021-01-10 NOTE — Progress Notes (Signed)
Speech Language Pathology Daily Session Note  Patient Details  Name: Jo Larson MRN: [id number] Date of Birth: 06/26/1932  Today's Date: 01/10/2021 SLP Individual Time: 5784-6962 SLP Individual Time Calculation (min): 45 min  Short Term Goals: Week 1: SLP Short Term Goal 1 (Week 1): Patient will be appropriately oriented to place, time, and situation with Min A verbal cues SLP Short Term Goal 2 (Week 1): Patient will demonstrate selective attention by attending to functional tasks with Min A verbal cues for re-direction SLP Short Term Goal 3 (Week 1): Patient will recall information discussed during therapy session via answering questions with Mod A verbal and visual cues SLP Short Term Goal 4 (Week 1): Patient will demonstrate the ability to adequately self-monitor swallowing skills and perform appropriate compensatory techniques (reduce rate of consumption, small bolus volumes) to safely consume Dys 2 diet and thin liquids with Mod A verbal cues.  Skilled Therapeutic Interventions: Skilled SLP treatment performed with focus on cognitive goals. Utilized video interpreter for translation. Assisted patient with oral care where she was Min A. Ivory colored coating on lingual surface is still present. MD was notified in person during session. Patient was oriented to place and situation with Min A verbal cues (re-phrasing question, using environmental context clues). Patient followed instruction and responded to simple questions with less difficulty and/or need for repetition as compared to previous encounter. Patient was able to communicate her needs effectively with Min A. Patient was left in bed with NT present to assist with toileting needs. Continue with current ST POC.     Pain Pain Assessment Pain Scale: 0-10 Pain Score: 0-No pain  Therapy/Group: Individual Therapy  [name] 01/10/2021, 8:52 AM

## 2021-01-10 NOTE — Progress Notes (Signed)
PROGRESS NOTE   Subjective/Complaints: Did much better wit therapy today! Nystatin ordered for thrush Magnesium ordered for hypomagnesemia BMP othewise stable  ROS- limited by language- appears comfortable  Objective:   No results found. No results for input(s): WBC, HGB, HCT, PLT in the last 72 hours.  Recent Labs    01/10/21 0459  NA 141  K 4.1  CL 105  CO2 23  GLUCOSE 129*  BUN 11  CREATININE 0.76  CALCIUM 9.3    Intake/Output Summary (Last 24 hours) at 01/10/2021 0931 Last data filed at 01/10/2021 0816 Gross per 24 hour  Intake 661 ml  Output --  Net 661 ml        Physical Exam: Vital Signs Blood pressure (!) 171/67, pulse (!) 58, temperature 98.7 F (37.1 C), temperature source Oral, resp. rate 18, height 5' (1.524 m), weight 71.1 kg, SpO2 94 %. Gen: no distress, normal appearing HEENT: oral mucosa pink and moist, NCAT Cardio: Bradycardic Chest: normal effort, normal rate of breathing Abd: soft, non-distended Ext: no edema Psych: pleasant, normal affect Skin: intact   Musculoskeletal:    Cervical back: Normal range of motion and neck supple.  Neurological:    Mental Status: She is awake and alert    Comments: Dysarthric Motor strength is 2/5 minus left upper extremity, 4/5 left lower extremity, 4/5 right upper and right lower extremity.  Psychiatric:    Comments: Became mildly agitated during motor exam but could be redirected.  Did not want her left arm to be touched.  Assessment/Plan: 1. Functional deficits which require 3+ hours per day of interdisciplinary therapy in a comprehensive inpatient rehab setting. Physiatrist is providing close team supervision and 24 hour management of active medical problems listed below. Physiatrist and rehab team continue to assess barriers to discharge/monitor patient progress toward functional and medical goals  Care Tool:  Bathing    Body parts bathed  by patient: Left arm, Chest, Abdomen, Front perineal area, Buttocks, Right upper leg, Left upper leg, Face   Body parts bathed by helper: Right arm, Right lower leg, Left lower leg     Bathing assist Assist Level: Moderate Assistance - Patient 50 - 74%     Upper Body Dressing/Undressing Upper body dressing   What is the patient wearing?: Pull over shirt    Upper body assist Assist Level: Moderate Assistance - Patient 50 - 74%    Lower Body Dressing/Undressing Lower body dressing      What is the patient wearing?: Incontinence brief, Pants     Lower body assist Assist for lower body dressing: Maximal Assistance - Patient 25 - 49%     Toileting Toileting    Toileting assist Assist for toileting: Maximal Assistance - Patient 25 - 49%     Transfers Chair/bed transfer  Transfers assist     Chair/bed transfer assist level: Moderate Assistance - Patient 50 - 74%     Locomotion Ambulation   Ambulation assist      Assist level: Minimal Assistance - Patient > 75% Assistive device: Walker-rolling Max distance: 62ft   Walk 10 feet activity   Assist     Assist level: Minimal Assistance - Patient >  75% Assistive device: Walker-rolling   Walk 50 feet activity   Assist Walk 50 feet with 2 turns activity did not occur: Safety/medical concerns         Walk 150 feet activity   Assist Walk 150 feet activity did not occur: Safety/medical concerns         Walk 10 feet on uneven surface  activity   Assist Walk 10 feet on uneven surfaces activity did not occur: Safety/medical concerns         Wheelchair     Assist Will patient use wheelchair at discharge?: No   Wheelchair activity did not occur: N/A         Wheelchair 50 feet with 2 turns activity    Assist    Wheelchair 50 feet with 2 turns activity did not occur: N/A       Wheelchair 150 feet activity     Assist  Wheelchair 150 feet activity did not occur: N/A        Blood pressure (!) 171/67, pulse (!) 58, temperature 98.7 F (37.1 C), temperature source Oral, resp. rate 18, height 5' (1.524 m), weight 71.1 kg, SpO2 94 %.  Medical Problem List and Plan: 1.  L hemiparesis and visual deficits secondary to R frontoparietal infarcts/patchy infarcts             -patient may  shower             -ELOS/Goals: 14-21 days- Supervision to min A after exam  -Continue CIR  2.  Impaired mobility, ambulating 75 feet -DVT/anticoagulation:  Pharmaceutical: Continue Lovenox             -antiplatelet therapy: 3. Headaches/Pain Management: Will schedule tylenol qid to help with HA/Pain 4. Mood: LCSW to follow for evaluation and support. Team to provide ego support.              -antipsychotic agents: N/a 5. Neuropsych: This patient is possibly not capable of making decisions on her own behalf. 6. Skin/Wound Care: . Routine pressure relief measures.  7. Fluids/Electrolytes/Nutrition: Monitor I/O. Check lytes in am 8. HTN: Monitor BP tid--continue Norvasc, catapres and cozaar.             --will check orthostatic vitals as reporting dizziness with transitional movements Vitals:   01/09/21 2114 01/10/21 0516  BP: (!) 158/65 (!) 171/67  Pulse: (!) 57 (!) 58  Resp: 18 18  Temp: 98.3 F (36.8 C) 98.7 F (37.1 C)  SpO2: 97% 94%   6/19 Labile . Cont to monitor  9. T2DM: Hgb A1c-. 8.8 and poorly controlled. Continue dose of levermir tonight --Will resume amaryl today and metformin tomorrow (48 hours post dye) -CBG (last 3)  Recent Labs    01/09/21 1701 01/09/21 2122 01/10/21 0622  GLUCAP 80 112* 140*  6/20: CBGs slightly elevated  10. Hypoxia: Educated on pulmonary hygeine. --Question fluid overload--Grade 1 DD w/mild AVR/sclerosis.               --CXR ordered with daily weights for monitoring. 11.  Dyslipidemia: 12.  H/o Depression: Continue Zoloft. 13. Hypothyroid: Managed with supplementation.  14. Pt doesn't speak [place]- from [place]- daughter  and grandson speak English-  15. Bradycardia: decreased amlodipine to 5mg  16. Abdominal pain: UA/UC ordered.  17. Diarrhea: senna/Miralax d/ced.  18. Leukocytosis: resolved WBC down to 10K today .  UA -, afeb 19. Hypokalemia: resolved with supplementation, edited diet order to request fresh fruit daily (she likes bananas), repeat Monday  20. Cervical myofascial pain: appreciate massage by Lendell Caprice ordered. 21. Hypomagnesemia: supplement 2gm IV today an repeat tomorrow. 22. Thrush: nystatin ordeed  LOS: 5 days A FACE TO FACE EVALUATION WAS PERFORMED  Jo Larson 01/10/2021, 9:31 AM

## 2021-01-11 LAB — GLUCOSE, CAPILLARY
Glucose-Capillary: 159 mg/dL — ABNORMAL HIGH (ref 70–99)
Glucose-Capillary: 163 mg/dL — ABNORMAL HIGH (ref 70–99)
Glucose-Capillary: 144 mg/dL — ABNORMAL HIGH (ref 70–99)
Glucose-Capillary: 115 mg/dL — ABNORMAL HIGH (ref 70–99)

## 2021-01-11 LAB — MAGNESIUM: Magnesium: 1.6 mg/dL — ABNORMAL LOW (ref 1.7–2.4)

## 2021-01-11 MED ORDER — MAGNESIUM SULFATE 2 GM/50ML IV SOLN
2.0000 g | Freq: Once | INTRAVENOUS | Status: AC
  Administered 2021-01-11: 2 g via INTRAVENOUS
  Filled 2021-01-11: qty 50

## 2021-01-11 MED ORDER — DICLOFENAC SODIUM 1 % EX GEL
2.0000 g | Freq: Four times a day (QID) | CUTANEOUS | Status: DC | PRN
  Filled 2021-01-11: qty 100

## 2021-01-11 MED ORDER — JUVEN PO PACK
1.0000 | PACK | Freq: Two times a day (BID) | ORAL | Status: DC
  Administered 2021-01-11 – 2021-01-19 (×17): 1 via ORAL
  Filled 2021-01-11 (×16): qty 1

## 2021-01-11 MED ORDER — MAGNESIUM OXIDE -MG SUPPLEMENT 400 (240 MG) MG PO TABS
400.0000 mg | ORAL_TABLET | Freq: Two times a day (BID) | ORAL | Status: DC
  Administered 2021-01-11 – 2021-01-20 (×19): 400 mg via ORAL
  Filled 2021-01-11 (×19): qty 1

## 2021-01-11 NOTE — Progress Notes (Signed)
PROGRESS NOTE   Subjective/Complaints: C/o dizziness. No evidence of orthostasis with therapy Ambulating in hallway with RW, needed rest break due to dizziness Will supplement with magnesium again today C/o wrist pain- will add voltaren gel  ROS- c/o left wrist pain  Objective:   No results found. No results for input(s): WBC, HGB, HCT, PLT in the last 72 hours.  Recent Labs    01/10/21 0459  NA 141  K 4.1  CL 105  CO2 23  GLUCOSE 129*  BUN 11  CREATININE 0.76  CALCIUM 9.3    Intake/Output Summary (Last 24 hours) at 01/11/2021 1144 Last data filed at 01/11/2021 0807 Gross per 24 hour  Intake 474 ml  Output 0 ml  Net 474 ml        Physical Exam: Vital Signs Blood pressure (!) 160/60, pulse (!) 52, temperature 97.6 F (36.4 C), temperature source Oral, resp. rate 18, height 5' (1.524 m), weight 72 kg, SpO2 95 %. Gen: no distress, normal appearing HEENT: oral mucosa pink and moist, NCAT Cardio: Braadycardic Chest: normal effort, normal rate of breathing Abd: soft, non-distended Ext: no edema Psych: pleasant, normal affect Skin: intact   Musculoskeletal:    Cervical back: Normal range of motion and neck supple.  Neurological:    Mental Status: She is awake and alert    Comments: Dysarthric Motor strength is 2/5 minus left upper extremity, 4/5 left lower extremity, 4/5 right upper and right lower extremity.  Psychiatric:    Comments: Became mildly agitated during motor exam but could be redirected.  Did not want her left arm to be touched.  Assessment/Plan: 1. Functional deficits which require 3+ hours per day of interdisciplinary therapy in a comprehensive inpatient rehab setting. Physiatrist is providing close team supervision and 24 hour management of active medical problems listed below. Physiatrist and rehab team continue to assess barriers to discharge/monitor patient progress toward functional and  medical goals  Care Tool:  Bathing    Body parts bathed by patient: Left arm, Chest, Abdomen, Front perineal area, Buttocks, Right upper leg, Left upper leg, Face   Body parts bathed by helper: Right arm, Right lower leg, Left lower leg     Bathing assist Assist Level: Moderate Assistance - Patient 50 - 74%     Upper Body Dressing/Undressing Upper body dressing   What is the patient wearing?: Pull over shirt    Upper body assist Assist Level: Minimal Assistance - Patient > 75%    Lower Body Dressing/Undressing Lower body dressing      What is the patient wearing?: Incontinence brief, Pants     Lower body assist Assist for lower body dressing: Maximal Assistance - Patient 25 - 49%     Toileting Toileting    Toileting assist Assist for toileting: Maximal Assistance - Patient 25 - 49%     Transfers Chair/bed transfer  Transfers assist     Chair/bed transfer assist level: Moderate Assistance - Patient 50 - 74%     Locomotion Ambulation   Ambulation assist      Assist level: Minimal Assistance - Patient > 75% Assistive device: Walker-rolling Max distance: 28ft   Walk 10 feet activity  Assist     Assist level: Minimal Assistance - Patient > 75% Assistive device: Walker-rolling   Walk 50 feet activity   Assist Walk 50 feet with 2 turns activity did not occur: Safety/medical concerns         Walk 150 feet activity   Assist Walk 150 feet activity did not occur: Safety/medical concerns         Walk 10 feet on uneven surface  activity   Assist Walk 10 feet on uneven surfaces activity did not occur: Safety/medical concerns         Wheelchair     Assist Will patient use wheelchair at discharge?: No   Wheelchair activity did not occur: N/A         Wheelchair 50 feet with 2 turns activity    Assist    Wheelchair 50 feet with 2 turns activity did not occur: N/A       Wheelchair 150 feet activity     Assist   Wheelchair 150 feet activity did not occur: N/A       Blood pressure (!) 160/60, pulse (!) 52, temperature 97.6 F (36.4 C), temperature source Oral, resp. rate 18, height 5' (1.524 m), weight 72 kg, SpO2 95 %.  Medical Problem List and Plan: 1.  L hemiparesis and visual deficits secondary to R frontoparietal infarcts/patchy infarcts             -patient may  shower             -ELOS/Goals: 14-21 days- Supervision to min A after exam  -Continue CIR  2.  Impaired mobility, ambulating 65 feet -DVT/anticoagulation:  Pharmaceutical: Continue Lovenox             -antiplatelet therapy: 3. Headaches/Pain Management: Will schedule tylenol qid to help with HA/Pain 4. Mood: LCSW to follow for evaluation and support. Team to provide ego support.              -antipsychotic agents: N/a 5. Neuropsych: This patient is possibly not capable of making decisions on her own behalf. 6. Skin/Wound Care: . Routine pressure relief measures.  7. Fluids/Electrolytes/Nutrition: Monitor I/O. Check lytes in am 8. HTN: Monitor BP tid--continue Norvasc, catapres and cozaar.             --will check orthostatic vitals as reporting dizziness with transitional movements Vitals:   01/10/21 1958 01/11/21 0526  BP: (!) 154/67 (!) 160/60  Pulse:  (!) 52  Resp: 18 18  Temp: 98.7 F (37.1 C) 97.6 F (36.4 C)  SpO2: 99% 95%   6/19 Labile . Cont to monitor  9. T2DM: Hgb A1c-. 8.8 and poorly controlled. Continue dose of levermir tonight --Will resume amaryl today and metformin tomorrow (48 hours post dye) -CBG (last 3)  Recent Labs    01/10/21 1653 01/10/21 2102 01/11/21 0619  GLUCAP 89 166* 159*  6/20: CBGs slightly elevated  10. Hypoxia: Educated on pulmonary hygeine. --Question fluid overload--Grade 1 DD w/mild AVR/sclerosis.               --CXR ordered with daily weights for monitoring. 11.  Dyslipidemia: 12.  H/o Depression: Continue Zoloft. 13. Hypothyroid: Managed with supplementation.  14. Pt  doesn't speak [place]- from [place]- daughter and grandson speak English-  15. Bradycardia: decreased amlodipine to 5mg  16. Abdominal pain: UA/UC ordered.  17. Diarrhea: senna/Miralax d/ced.  18. Leukocytosis: resolved WBC down to 10K today .  UA -, afeb 19. Hypokalemia: resolved with supplementation, edited diet order  to request fresh fruit daily (she likes bananas), repeat Monday  20. Cervical myofascial pain: appreciate massage by Lendell Caprice ordered. 21. Hypomagnesemia: supplement 2gm IV today an repeat tomorrow. 22. Thrush: nystatin ordered 23. Left wrist pain: voltaren gel ordered  LOS: 6 days A FACE TO FACE EVALUATION WAS PERFORMED  Drema Pry Gildardo Tickner 01/11/2021, 11:44 AM

## 2021-01-11 NOTE — Progress Notes (Signed)
Physical Therapy Session Note  Patient Details  Name: Jo Larson MRN: [id number] Date of Birth: 1932/06/30  Today's Date: 01/11/2021 PT Individual Time: 1001-1030 + 1300-1359 PT Individual Time Calculation (min): 29 min + 59 min  Short Term Goals: Week 1:  PT Short Term Goal 1 (Week 1): Pt will complete bed mobility with minA PT Short Term Goal 2 (Week 1): Pt will complete bed<>chair transfers with minA and LRAD PT Short Term Goal 3 (Week 1): Pt will ambulate 40ft with minA and LRAD PT Short Term Goal 4 (Week 1): Pt will initiate stair training  Skilled Therapeutic Interventions/Progress Updates:     1st session: Pt greeted supine in bed to start session, in person interpreter at the bedside. Pt denies pain but needs convincing to participate in mobilizing OOB. With time and effort, she was agreeable. Donned her pants with modA while supine in bed, needs encouragement for assisting with bringing pants over hips. Supine<>sit completed with minA with use of bed features. Provided blanket over shoulders as interpreter says she is "cold natured" and is "always cold." Sit<>stand to RW with CGA and ambulated ~19ft with minA and RW with intermittent standing rest breaks. She c/o wrist pain related to the RW splint, described as "pinching." Difficult to discern neuropathic pain from CVA vs positioning on splint. Pt returned to her room and completed stand<>pivot transfer with minA and no AD back to bed. Requires minA for sit<>supine for trunk and BLE management - cues needed as well for symmetrical positioning. Bed alarm activated and needs in reach.   2nd session: Pt received supine in bed to start session. No in person interpreter at start but she arrived a few minutes after start. Pt connected to IV pole - reached out to RN to saline lock IV to reduce limitations in mobility. Pt also requesting hopsital gown, this was retrieved and assisted pt to don it with maxA while she sat EOB. Supine<>sit  completed with CGA with use of bed features. Also donned her zip-up jacket with maxA with education on hemi technique for dressing.   Completed stand<>pivot transfer with minA and no AD to w/c and wheeled to main rehab gym for time management. Completed gait training 2x61ft with minA and RW - cues provided for keeping body within walker frame and keeping straight path with RW management. She demo's decreased gait speed with short shuffling steps, no knee buckling or LOB noted but decreased step length L>R. She continues to require mod to maxA for safety approach to sitting surfaces, especially with 180deg turns - she struggles with stepping backwards and bringing her RW with her and sits prematurely requiring maxA for sitting in the chair.   She c/o wrist pain related to RW splint - attempted to pad the palmar side but this did not help. Pt reports she broke her wrist during a fall that led to this hospitalization. Per chart review, pt had a fall in 05/2020 that resulted in a comminuted mildly impacted and displaced distal metaphysis left radius fracture.   Attempted to instruct her in repeated stand<>pivot transfers to arm chair but pt c/o increased fatigue and requests to return to room. She was assisted back to bed with minA via stand<>Pivot and made comfortable in bed. Bed alarm activated and needs in reach.  Therapy Documentation Precautions:  Precautions Precautions: Fall Precaution Comments: Non-English speaking. Needs interpreter - [place] Restrictions Weight Bearing Restrictions: No General:     Therapy/Group: Individual Therapy  Thai Hemrick P Barkley Kratochvil PT 01/11/2021,  7:49 AM

## 2021-01-11 NOTE — Progress Notes (Signed)
Speech Language Pathology Daily Session Note  Patient Details  Name: Jo Larson MRN: [id number] Date of Birth: Aug 31, 1931  Today's Date: 01/11/2021 SLP Individual Time: 1120-1200 SLP Individual Time Calculation (min): 40 min  Short Term Goals: Week 1: SLP Short Term Goal 1 (Week 1): Patient will be appropriately oriented to place, time, and situation with Min A verbal cues SLP Short Term Goal 2 (Week 1): Patient will demonstrate selective attention by attending to functional tasks with Min A verbal cues for re-direction SLP Short Term Goal 3 (Week 1): Patient will recall information discussed during therapy session via answering questions with Mod A verbal and visual cues SLP Short Term Goal 4 (Week 1): Patient will demonstrate the ability to adequately self-monitor swallowing skills and perform appropriate compensatory techniques (reduce rate of consumption, small bolus volumes) to safely consume Dys 2 diet and thin liquids with Mod A verbal cues.  Skilled Therapeutic Interventions:   Patient seen with [place] interpreter for skilled ST session focusing on cognitive goals. She was oriented to month but stated "2002" for year, though she was aware of her current age and then correctly chose 2022 from field of two choices. She was able to recall an exercise completed earlier this morning with OT/PT, however had difficulty being specific and required min-modA cues to elaborate. During semi-structured conversation, SLP and interpreter frequently had to cue her to continue as she would appear to trail off in her speech and not always complete a thought. Patient continues to benefit from skilled SLP intervention to maximize cognitive-linguistic and swallow function prior to discharge.  Pain Pain Assessment Pain Scale: 0-10 Faces Pain Scale: No hurt  Therapy/Group: Individual Therapy  Angela Nevin, MA, CCC-SLP Speech Therapy

## 2021-01-11 NOTE — Progress Notes (Signed)
Patient ID: Jo Larson, female   DOB: 1932/05/27, 87 y.o.   MRN: [id number]  Met with daughter in-law when here to visit pt. Discussed team conference goals of CGA-min level of assist and discharge date 7/1. Have scheduled family training for 6/30 at 9-12. Daughter in-law works second shift so will need to leave at 1:00. She reports there is always someone home with her. Has no equipment from previously.  Made aware pt will need 24/7 care at discharge. Will make discharge arrangements.

## 2021-01-11 NOTE — Progress Notes (Signed)
Occupational Therapy Note  Patient Details  Name: Jo Larson MRN: [id number] Date of Birth: 29-Jul-1931  Today's Date: 01/11/2021 OT Missed Time: 30 Minutes Missed Time Reason: Nursing care  Pt missed 30 mins skilled OT services. Pt on bed pan upon arrival with nursing staff. Pt receiving nursing care and unable to participate. Will check back later.   Lavone Neri [facility] 01/11/2021, 9:23 AM

## 2021-01-12 ENCOUNTER — Inpatient Hospital Stay (HOSPITAL_COMMUNITY): Payer: Medicare Other

## 2021-01-12 LAB — GLUCOSE, CAPILLARY
Glucose-Capillary: 158 mg/dL — ABNORMAL HIGH (ref 70–99)
Glucose-Capillary: 150 mg/dL — ABNORMAL HIGH (ref 70–99)
Glucose-Capillary: 129 mg/dL — ABNORMAL HIGH (ref 70–99)
Glucose-Capillary: 112 mg/dL — ABNORMAL HIGH (ref 70–99)

## 2021-01-12 LAB — MAGNESIUM: Magnesium: 1.8 mg/dL (ref 1.7–2.4)

## 2021-01-12 MED ORDER — DICLOFENAC SODIUM 1 % EX GEL
2.0000 g | Freq: Four times a day (QID) | CUTANEOUS | Status: DC
  Administered 2021-01-12 – 2021-01-20 (×32): 2 g via TOPICAL
  Filled 2021-01-12: qty 100

## 2021-01-12 MED ORDER — METFORMIN HCL 500 MG PO TABS
1000.0000 mg | ORAL_TABLET | Freq: Two times a day (BID) | ORAL | Status: DC
  Administered 2021-01-12 – 2021-01-20 (×16): 1000 mg via ORAL
  Filled 2021-01-12 (×16): qty 2

## 2021-01-12 MED ORDER — MAGNESIUM SULFATE 2 GM/50ML IV SOLN
2.0000 g | Freq: Once | INTRAVENOUS | Status: AC
  Administered 2021-01-12: 2 g via INTRAVENOUS
  Filled 2021-01-12: qty 50

## 2021-01-12 NOTE — Progress Notes (Signed)
Occupational Therapy Session Note  Patient Details  Name: Jo Larson MRN: [id number] Date of Birth: 1932/02/04  Today's Date: 01/12/2021 OT Individual Time: 1005-1100 OT Individual Time Calculation (min): 55 min    Short Term Goals: Week 1:  OT Short Term Goal 1 (Week 1): Pt will complete LB dressing with mod assist. OT Short Term Goal 2 (Week 1): Pt will complete toilet transfer with min assist OT Short Term Goal 3 (Week 1): Pt will complete bathing with mod assist.  Skilled Therapeutic Interventions/Progress Updates:    Pt received in wc with interpretor present.   It took some convincing, but pt finally agreed to a shower. Obtained a shower cap so she would not have to get her hair wet.  Attempted to use RW to bathroom but very difficult due to L hemiparesis, inattention and motor planning.   Used HHA with mod A to continue ambulating to toilet. Pt did not need to toilet again but brief just slightly wet.  Pt stepped into shower needing significant cues for motor planning. Bathed with mod A and max cues as pt often thinks she cant do it. Transferred to wc to dress with min UB max LB.  Again needing significant cuing as pt had poor initiation.   Pt requested to get into bed, transferred with min but then mod A to move to supine as she just flopped back.  Pt adjusted in bed with all needs met.  Alarm set.    Therapy Documentation Precautions:  Precautions Precautions: Fall Precaution Comments: Non-English speaking. Needs interpreter - [place] Restrictions Weight Bearing Restrictions: No     Pain:  c/o L wrist pain; applied voltaren gel to wrist per nursing   ADL: ADL Eating: Set up Grooming: Setup Where Assessed-Grooming: Sitting at sink Upper Body Bathing: Minimal assistance Where Assessed-Upper Body Bathing: Shower Lower Body Bathing: Moderate assistance Where Assessed-Lower Body Bathing: Shower Upper Body Dressing: Minimal assistance Where Assessed-Upper Body  Dressing: Chair Lower Body Dressing: Maximal assistance Where Assessed-Lower Body Dressing: Chair Toileting: Maximal assistance Where Assessed-Toileting: Glass blower/designer: Moderate assistance Toilet Transfer Method: Stand pivot Toilet Transfer Equipment: Energy manager: Moderate assistance Social research officer, government Method: Radiographer, therapeutic: Radio broadcast assistant, Grab bars  Therapy/Group: Individual Therapy  [place] 01/12/2021, 8:32 AM

## 2021-01-12 NOTE — Progress Notes (Signed)
Speech Language Pathology Daily Session Note  Patient Details  Name: Jo Larson MRN: [id number] Date of Birth: January 13, 1932  Today's Date: 01/12/2021 SLP Individual Time: 1330-1430 SLP Individual Time Calculation (min): 60 min  Short Term Goals: Week 1: SLP Short Term Goal 1 (Week 1): Patient will be appropriately oriented to place, time, and situation with Min A verbal cues SLP Short Term Goal 2 (Week 1): Patient will demonstrate selective attention by attending to functional tasks with Min A verbal cues for re-direction SLP Short Term Goal 3 (Week 1): Patient will recall information discussed during therapy session via answering questions with Mod A verbal and visual cues SLP Short Term Goal 4 (Week 1): Patient will demonstrate the ability to adequately self-monitor swallowing skills and perform appropriate compensatory techniques (reduce rate of consumption, small bolus volumes) to safely consume Dys 2 diet and thin liquids with Mod A verbal cues.  Skilled Therapeutic Interventions: Skilled ST intervention performed with focus on cognitive goals. Patient seen with interpreter. Assisted patient with oral care at sink with min-to-mod cues to initiate and persist to task. No coating noted on tongue.ST facilitated sorting task using silverware in ADL apartment. Patient was able to attend to task with Mod A verbal cues to persist. Facilitated abstract reasoning with common objects and foods (which one is bigger, which one do you peel, which one is hardest, etc.) with Min A verbal cues. Interpreter reports patient required increased repetition and re-phrasing with questions and comments to comprehend. Patient appeared to fatigue by end of session with less participation. Patient was left in bed with alarm activated and needs within reach. NT had just arrived upon conclusion of session. Continue per ST POC.    Pain Pain Assessment Pain Scale: 0-10 Pain Score: 0-No pain  Therapy/Group: Individual  Therapy  [name] 01/12/2021, 1:41 PM

## 2021-01-12 NOTE — Progress Notes (Signed)
PROGRESS NOTE   Subjective/Complaints: Patient initially reported no new complaints.  When asked, she does note continued left wrist pain at site of prior fracture. Will repeat XR, schedule voltaren gel (she has not asked for it), and order brace  ROS- c/o left wrist pain, + dizziness  Objective:   No results found. No results for input(s): WBC, HGB, HCT, PLT in the last 72 hours.  Recent Labs    01/10/21 0459  NA 141  K 4.1  CL 105  CO2 23  GLUCOSE 129*  BUN 11  CREATININE 0.76  CALCIUM 9.3    Intake/Output Summary (Last 24 hours) at 01/12/2021 1115 Last data filed at 01/12/2021 0801 Gross per 24 hour  Intake 820 ml  Output --  Net 820 ml        Physical Exam: Vital Signs Blood pressure (!) 129/59, pulse 62, temperature 98.6 F (37 C), resp. rate 15, height 5' (1.524 m), weight 69.3 kg, SpO2 93 %. Gen: no distress, normal appearing HEENT: oral mucosa pink and moist, NCAT Cardio: Reg rate Chest: normal effort, normal rate of breathing Abd: soft, non-distended Ext: no edema Psych: pleasant, normal affect   Musculoskeletal:    Cervical back: Normal range of motion and neck supple.  Neurological:    Mental Status: She is awake and alert    Comments: Dysarthric Motor strength is 2/5 minus left upper extremity, 4/5 left lower extremity, 4/5 right upper and right lower extremity.  Psychiatric:    Comments: Became mildly agitated during motor exam but could be redirected.  Did not want her left arm to be touched.  Assessment/Plan: 1. Functional deficits which require 3+ hours per day of interdisciplinary therapy in a comprehensive inpatient rehab setting. Physiatrist is providing close team supervision and 24 hour management of active medical problems listed below. Physiatrist and rehab team continue to assess barriers to discharge/monitor patient progress toward functional and medical goals  Care  Tool:  Bathing    Body parts bathed by patient: Left arm, Chest, Abdomen, Front perineal area, Buttocks, Right upper leg, Left upper leg, Face   Body parts bathed by helper: Right arm, Right lower leg, Left lower leg     Bathing assist Assist Level: Moderate Assistance - Patient 50 - 74%     Upper Body Dressing/Undressing Upper body dressing   What is the patient wearing?: Pull over shirt    Upper body assist Assist Level: Minimal Assistance - Patient > 75%    Lower Body Dressing/Undressing Lower body dressing      What is the patient wearing?: Incontinence brief, Pants     Lower body assist Assist for lower body dressing: Maximal Assistance - Patient 25 - 49%     Toileting Toileting    Toileting assist Assist for toileting: Maximal Assistance - Patient 25 - 49%     Transfers Chair/bed transfer  Transfers assist     Chair/bed transfer assist level: Moderate Assistance - Patient 50 - 74%     Locomotion Ambulation   Ambulation assist      Assist level: Minimal Assistance - Patient > 75% Assistive device: Walker-rolling Max distance: 35ft   Walk 10 feet activity  Assist     Assist level: Minimal Assistance - Patient > 75% Assistive device: Walker-rolling   Walk 50 feet activity   Assist Walk 50 feet with 2 turns activity did not occur: Safety/medical concerns         Walk 150 feet activity   Assist Walk 150 feet activity did not occur: Safety/medical concerns         Walk 10 feet on uneven surface  activity   Assist Walk 10 feet on uneven surfaces activity did not occur: Safety/medical concerns         Wheelchair     Assist Will patient use wheelchair at discharge?: No   Wheelchair activity did not occur: N/A         Wheelchair 50 feet with 2 turns activity    Assist    Wheelchair 50 feet with 2 turns activity did not occur: N/A       Wheelchair 150 feet activity     Assist  Wheelchair 150 feet  activity did not occur: N/A       Blood pressure (!) 129/59, pulse 62, temperature 98.6 F (37 C), resp. rate 15, height 5' (1.524 m), weight 69.3 kg, SpO2 93 %.  Medical Problem List and Plan: 1.  L hemiparesis and visual deficits secondary to R frontoparietal infarcts/patchy infarcts             -patient may  shower             -ELOS/Goals: 14-21 days- Supervision to min A after exam  -Continue CIR  2.  Impaired mobility, ambulating 100 feet -DVT/anticoagulation:  Pharmaceutical: Continue Lovenox             -antiplatelet therapy: 3. Headaches/Pain Management: Will schedule tylenol qid to help with HA/Pain 4. Mood: LCSW to follow for evaluation and support. Team to provide ego support.              -antipsychotic agents: N/a 5. Neuropsych: This patient is possibly not capable of making decisions on her own behalf. 6. Skin/Wound Care: . Routine pressure relief measures.  7. Fluids/Electrolytes/Nutrition: Monitor I/O. Check lytes in am 8. HTN: Monitor BP tid--continue Norvasc, catapres and cozaar.             --will check orthostatic vitals as reporting dizziness with transitional movements Vitals:   01/12/21 0411 01/12/21 0856  BP: (!) 160/64 (!) 129/59  Pulse: (!) 58 62  Resp: 17 15  Temp: 98.7 F (37.1 C) 98.6 F (37 C)  SpO2: 93% 93%   6/19 Labile . Cont to monitor  9. T2DM: Hgb A1c-. 8.8 and poorly controlled. Continue dose of levermir tonight --Will resume amaryl today and metformin tomorrow (48 hours post dye) -CBG (last 3)  Recent Labs    01/11/21 1658 01/11/21 2102 01/12/21 0612  GLUCAP 144* 115* 158*  6/23: CBGs slightly elevated, increase metformin to 1000mg  BID.   10. Hypoxia: Educated on pulmonary hygeine. --Question fluid overload--Grade 1 DD w/mild AVR/sclerosis.               --CXR ordered with daily weights for monitoring. 11.  Dyslipidemia: 12.  H/o Depression: Continue Zoloft. 13. Hypothyroid: Managed with supplementation.  14. Pt doesn't speak  - from [place]- daughter and grandson speak English-  15. Bradycardia: decreased amlodipine to 5mg  16. Abdominal pain: UA/UC ordered.  17. Diarrhea: senna/Miralax d/ced.  18. Leukocytosis: resolved WBC down to 10K today .  UA -, afeb 19. Hypokalemia: resolved with supplementation, edited  diet order to request fresh fruit daily (she likes bananas), repeat Monday  20. Cervical myofascial pain: appreciate massage by Lendell Caprice ordered. 21. Hypomagnesemia: supplement 2gm IV today an repeat tomorrow, improving. Oral scheduled supplement also started.  22. Thrush: nystatin ordered 23. Left wrist pain, has history of fracture in 2021: voltaren gel ordered scheduled, wrist brace and XR ordered,   LOS: 7 days A FACE TO FACE EVALUATION WAS PERFORMED  Odilon Cass P Dysen Edmondson 01/12/2021, 11:15 AM

## 2021-01-12 NOTE — Progress Notes (Signed)
Physical Therapy Session Note  Patient Details  Name: Jo Larson MRN: [id number] Date of Birth: 04-28-1932  Today's Date: 01/12/2021 PT Individual Time: 0900-0955 PT Individual Time Calculation (min): 55 min   Short Term Goals: Week 1:  PT Short Term Goal 1 (Week 1): Pt will complete bed mobility with minA PT Short Term Goal 2 (Week 1): Pt will complete bed<>chair transfers with minA and LRAD PT Short Term Goal 3 (Week 1): Pt will ambulate 75f with minA and LRAD PT Short Term Goal 4 (Week 1): Pt will initiate stair training  Skilled Therapeutic Interventions/Progress Updates:     Pt received supine in bed at start of session and agreeable to PT tx. No in person interpreter present at start of session but arrives a few minutes later. Used [employer] Interpreter to start. Pt reports incontinence episode of urine and bowel. Required totalA for brief change and totalA for posterior pericare - she was able to perform frontal pericare with setupA. Pt noted to be incontinent of urine only. Donned pants with modA with cues for bridging technique to pull pants over hips - able to lift hips and clear buttock. Supine<>sit completed with minA with HOB flat and use of bed rail. Completed stand<>pivot transfer with minA to w/c with no AD. Pt reporting need to use the toilet after getting in the w/c. Ambulated ~120fwith HHA and minA to 3-1 BSC over the toilet. Required maxA for lower body dressing and pt continent of bladder and bowel - she required totalA for posterior pericare and then ambulated back to her w/c, ~1039fwith minA via HHA. Wheeled to main rehab gym for time management. Focused remainder of session on initiating stair training - provided demonstration to improve carryover. She negotiated up the smaller (3 inch) 8 steps with minA but needs heavy modA for descent - possibly due to fatigue. She was returned to her room in w/c and remained seated in chair with all needs met, waiting upcoming OT  session with interpreter at bedside.   Therapy Documentation Precautions:  Precautions Precautions: Fall Precaution Comments: Non-English speaking. Needs interpreter - [place] Weight Bearing Restrictions: No General:    Therapy/Group: Individual Therapy  ChrAlger Simons23/2022, 7:34 AM

## 2021-01-13 LAB — CBC WITH DIFFERENTIAL/PLATELET
Abs Immature Granulocytes: 0.44 10*3/uL — ABNORMAL HIGH (ref 0.00–0.07)
Basophils Absolute: 0.1 10*3/uL (ref 0.0–0.1)
Basophils Relative: 0 %
Eosinophils Absolute: 0.3 10*3/uL (ref 0.0–0.5)
Eosinophils Relative: 2 %
HCT: 29.8 % — ABNORMAL LOW (ref 36.0–46.0)
Hemoglobin: 9.3 g/dL — ABNORMAL LOW (ref 12.0–15.0)
Immature Granulocytes: 4 %
Lymphocytes Relative: 23 %
Lymphs Abs: 2.8 10*3/uL (ref 0.7–4.0)
MCH: 29 pg (ref 26.0–34.0)
MCHC: 31.2 g/dL (ref 30.0–36.0)
MCV: 92.8 fL (ref 80.0–100.0)
Monocytes Absolute: 0.7 10*3/uL (ref 0.1–1.0)
Monocytes Relative: 6 %
Neutro Abs: 7.7 10*3/uL (ref 1.7–7.7)
Neutrophils Relative %: 65 %
Platelets: 477 10*3/uL — ABNORMAL HIGH (ref 150–400)
RBC: 3.21 MIL/uL — ABNORMAL LOW (ref 3.87–5.11)
RDW: 14.4 % (ref 11.5–15.5)
WBC: 12 10*3/uL — ABNORMAL HIGH (ref 4.0–10.5)
nRBC: 0 % (ref 0.0–0.2)

## 2021-01-13 LAB — BASIC METABOLIC PANEL
Anion gap: 9 (ref 5–15)
BUN: 29 mg/dL — ABNORMAL HIGH (ref 8–23)
CO2: 23 mmol/L (ref 22–32)
Calcium: 9.3 mg/dL (ref 8.9–10.3)
Chloride: 103 mmol/L (ref 98–111)
Creatinine, Ser: 0.94 mg/dL (ref 0.44–1.00)
GFR, Estimated: 59 mL/min — ABNORMAL LOW (ref >60)
Glucose, Bld: 206 mg/dL — ABNORMAL HIGH (ref 70–99)
Potassium: 5 mmol/L (ref 3.5–5.1)
Sodium: 135 mmol/L (ref 135–145)

## 2021-01-13 LAB — GLUCOSE, CAPILLARY
Glucose-Capillary: 134 mg/dL — ABNORMAL HIGH (ref 70–99)
Glucose-Capillary: 180 mg/dL — ABNORMAL HIGH (ref 70–99)
Glucose-Capillary: 141 mg/dL — ABNORMAL HIGH (ref 70–99)
Glucose-Capillary: 94 mg/dL (ref 70–99)

## 2021-01-13 LAB — MAGNESIUM: Magnesium: 1.9 mg/dL (ref 1.7–2.4)

## 2021-01-13 NOTE — Progress Notes (Signed)
Speech Language Pathology Weekly Progress and Session Note  Patient Details  Name: Jo Larson MRN: [id number] Date of Birth: 28-Jun-1932  Beginning of progress report period: January 06, 2021 End of progress report period: January 13, 2021  Today's Date: 01/13/2021 SLP Individual Time: 1015-1100 SLP Individual Time Calculation (min): 45 min  Short Term Goals: Week 1: SLP Short Term Goal 1 (Week 1): Patient will be appropriately oriented to place, time, and situation with Min A verbal cues SLP Short Term Goal 1 - Progress (Week 1): Not met SLP Short Term Goal 2 (Week 1): Patient will demonstrate selective attention by attending to functional tasks with Min A verbal cues for re-direction SLP Short Term Goal 2 - Progress (Week 1): Not met SLP Short Term Goal 3 (Week 1): Patient will recall information discussed during therapy session via answering questions with Mod A verbal and visual cues SLP Short Term Goal 3 - Progress (Week 1): Met SLP Short Term Goal 4 (Week 1): Patient will demonstrate the ability to adequately self-monitor swallowing skills and perform appropriate compensatory techniques (reduce rate of consumption, small bolus volumes) to safely consume Dys 2 diet and thin liquids with Mod A verbal cues. SLP Short Term Goal 4 - Progress (Week 1): Met    New Short Term Goals: Week 2: SLP Short Term Goal 1 (Week 2): Patient will be consistently oriented to place, time, and situation with min A verbal cues across 3 treatment sessions SLP Short Term Goal 2 (Week 2): Patient will recall basic, daily information with Min A verbal cues SLP Short Term Goal 3 (Week 2): Patient will demonstrate selective attention by attending to functional tasks with Min A verbal cues for re-direction SLP Short Term Goal 4 (Week 2): Patient will demonstrate the ability to adequately self-monitor swallowing skills (reduce rate of consumption, reduce bolus size) and perform appropriate compensatory techniques  (lingual sweep) to safely consume Dys 2 diet and thin liquids with Min A verbal cues.  Weekly Progress Updates: Patient is progressing slowly and has met 2 of 4 short-term goals. She exhibits improved alertness and attention during functional tasks and recall of events during the day. She is inconsistently oriented and exhibits limited cognitive endurance and/or interest to therapeutic tasks. Comprehension of instructions and general conversation is also inconsistent per interpreter report and fluctuates day-to-day. This may attribute to level of fatigue or interest/motivation. Requires consistent verbal encouragement and positive praise to maximize participation. Currently at [place] assist for cognition. She continues to consume Dys 2 diet and thin liquids with full supervision for swallow safety due to observations of impulsivity with self-feeding, reduced self-monitoring skills, and tendency to pocket PO intake. Dys 2 diet continues to appear most appropriate at this time due to oral deficits and edentulous status. She continues requires Mod A verbal cueing to take small bites and clear pocketing. Patient would continue to benefit from skilled SLP intervention to maximize cognitive functions, and functional independence prior to discharge.  Patient is progressing toward long term goals.   Intensity: Minumum of 1-2 x/day, 30 to 90 minutes Frequency: 3 to 5 out of 7 days Duration/Length of Stay: 2-3 weeks Treatment/Interventions: Cognitive remediation/compensation;Environmental Environmental consultant;Dysphagia/aspiration precaution training;Functional tasks;Patient/family education;Therapeutic Activities   Daily Session Skilled Therapeutic Interventions: Skilled ST treatment performed with focus on cognitive goals. Patient received in wheelchair with in-person interpreter. ST facilitated sustained attention and sorting task using common cooking/baking items with 65% accuracy and Mod A verbal and  visual cues for attention, initiation, persisting to task,  and recall of instructions. Patient was asked to sort whether the item belonged in the pantry/shelf, or refrigerator. Patient asked "what do I do with this?" and required verbal reminders to initiate placement during every item (12 occasions) with the exception of the last 2 attempts. Accuracy of task may have attributed to cultural and language differences. St utilized written cues on occasion (written by interpreter) to promote comprehension which appeared slightly effective. ST reinforced safe swallow strategies and precautions (slow rate of consumption, small bolus size, one bite at a time) and strategies assist with clearing oral residue (lingual sweep). Patient demonstrated effective lingual sweep and implementation of safe swallow strategies with Mod A verbal cues. Patient was left in wheelchair with alarm activated and needs within reach. Continue per ST POC.  General    Pain Pain Assessment Pain Scale: 0-10 Pain Score: 0-No pain  Therapy/Group: Individual Therapy  Patty Sermons 01/13/2021, 4:29 PM

## 2021-01-13 NOTE — Progress Notes (Signed)
Physical Therapy Weekly Progress Note  Patient Details  Name: Jo Larson MRN: [id number] Date of Birth: October 01, 1931  Beginning of progress report period: January 06, 2021 End of progress report period: January 13, 2021  Today's Date: 01/13/2021 PT Individual Time: 2585-2778 PT Individual Time Calculation (min): 55 min   Patient has met 4 of 4 short term goals. Pt is making slow progress towards goals. She is able to copmlete bed mobility with minA, sit<>stand transfers with minA, bed<>chair transfers with minA via HHA, and has ambulated 45f with minA and RW. Stair training has been initiated and she is able to go up/down 8 smaller (3inch) steps with modA and 2 hand rails. Gait distances are limited by both fatigue and LUE pain from prior wrist fx (2021). Treatment sessions have also been limited due to language barrier (speaks [place] and cultural barriers. Anticipate pt will be more comfortable in home environment with family support at discharge.   Patient continues to demonstrate the following deficits muscle weakness, decreased cardiorespiratoy endurance, unbalanced muscle activation, motor apraxia, and decreased motor planning, decreased initiation, decreased attention, decreased awareness, decreased problem solving, decreased safety awareness, decreased memory, and delayed processing, and decreased sitting balance, decreased standing balance, decreased postural control, hemiplegia, and decreased balance strategies and therefore will continue to benefit from skilled PT intervention to increase functional independence with mobility.  Patient progressing toward long term goals..  Continue plan of care.  PT Short Term Goals Week 1:  PT Short Term Goal 1 (Week 1): Pt will complete bed mobility with minA PT Short Term Goal 1 - Progress (Week 1): Met PT Short Term Goal 2 (Week 1): Pt will complete bed<>chair transfers with minA and LRAD PT Short Term Goal 2 - Progress (Week 1): Met PT Short Term  Goal 3 (Week 1): Pt will ambulate 544fwith minA and LRAD PT Short Term Goal 3 - Progress (Week 1): Met PT Short Term Goal 4 (Week 1): Pt will initiate stair training PT Short Term Goal 4 - Progress (Week 1): Met Week 2:  PT Short Term Goal 1 (Week 2): STG = LTG due to ELOS  Skilled Therapeutic Interventions/Progress Updates:    Pt received supine in bed to start session - in person interpreter at bedside to assist. Pt reports generalized aches and pains but unrated. She needs convincing to participate in therapy session due to fatigue from her busy day of therapies. She completes supine<>sit with supervision with use of bed features. Pt reporting need to void - sit<>stand with minA via HHA to R hand - ambulated ~15105fith minA and HHA to bathroom and needing assist for managing lower body clothes in standing with minA for balance. Pt unable to produce urine. Sit<>stand with minA via HHA to R hand and needing assist to pull pants/briefs over pants. Ambulated 44f40fth minA via HHA from bathroom to hallway with +2 assist for w/c follow for safety - cues for increasing L step length and L foot clearance. Gait distances limited by fatigue. Pt wheeled remaining distance to main rehab gym. Introduced quad cane to allow improved indep with gait rather than relying on HHA. She ambulated ~5-10ft40fh modA and quad cane - difficulty with sequencing and stepping patterns and managing quad cane - novel task vs motor apraxia. Ambulated ~40ft 45f minA and HHA to the R hand to mat table in main rehab gym with ces similar as above. Seated rest break needed. Ambulated ~20ft w19fminA to steps and she navigated up/down  5 smaller (3inch) steps with modA and 1 hand rail on the R. She began reporting fatigue and wanted to get down the steps but needed +2 modA for safety for her to navigate the steps down. Language barrier impacting instructions and ability to follow through. She was requesting to return to the bed at this point  of the session so she was wheeled back to her room and completed stand<>pivot with minA to EOB and needed minA for sit>supine. ModA needed for repositioning in the bed. Bed alarm activated and all needs within reach.   Therapy Documentation Precautions:  Precautions Precautions: Fall Precaution Comments: Non-English speaking. Needs interpreter - [place] Restrictions Weight Bearing Restrictions: No General:    Therapy/Group: Individual Therapy  Alger Simons 01/13/2021, 7:36 AM

## 2021-01-13 NOTE — Progress Notes (Signed)
PROGRESS NOTE   Subjective/Complaints: Continues to complain of left wrist pain- did not receive brace, diclofenac gel does help. Smiling today.  Currently working with SLP  ROS- c/o left wrist pain- improved, + dizziness  Objective:   DG Wrist Complete Left  Result Date: 01/12/2021 CLINICAL DATA:  Wrist pain EXAM: LEFT WRIST - COMPLETE 3+ VIEW COMPARISON:  05/23/2020 FINDINGS: Old fracture deformity of the distal radius. Old fracture deformity of the ulnar styloid. No definite acute displaced fracture is seen. Degenerative changes at the first [facility] joint. IMPRESSION: Old fracture deformities of the distal radius and ulna. No definite acute osseous abnormality Electronically Signed   By: Jasmine Pang M.D.   On: 01/12/2021 17:36   No results for input(s): WBC, HGB, HCT, PLT in the last 72 hours.  No results for input(s): NA, K, CL, CO2, GLUCOSE, BUN, CREATININE, CALCIUM in the last 72 hours.   Intake/Output Summary (Last 24 hours) at 01/13/2021 1201 Last data filed at 01/13/2021 0844 Gross per 24 hour  Intake 283 ml  Output --  Net 283 ml        Physical Exam: Vital Signs Blood pressure (!) 151/59, pulse (!) 52, temperature 97.7 F (36.5 C), resp. rate 15, height 5' (1.524 m), weight 69 kg, SpO2 96 %. Gen: no distress, normal appearing HEENT: oral mucosa pink and moist, NCAT, thrush improved Cardio: Bradycardic Chest: normal effort, normal rate of breathing Abd: soft, non-distended Ext: no edema Psych: pleasant, normal affect Skin: intact   Musculoskeletal:    Cervical back: Normal range of motion and neck supple.  Neurological:    Mental Status: She is awake and alert    Comments: Dysarthric Motor strength is 2/5 minus left upper extremity, 4/5 left lower extremity, 4/5 right upper and right lower extremity.  Psychiatric:    Comments: Became mildly agitated during motor exam but could be redirected.  Did not want  her left arm to be touched.  Assessment/Plan: 1. Functional deficits which require 3+ hours per day of interdisciplinary therapy in a comprehensive inpatient rehab setting. Physiatrist is providing close team supervision and 24 hour management of active medical problems listed below. Physiatrist and rehab team continue to assess barriers to discharge/monitor patient progress toward functional and medical goals  Care Tool:  Bathing    Body parts bathed by patient: Left arm, Chest, Abdomen, Front perineal area, Right upper leg, Left upper leg, Face   Body parts bathed by helper: Right arm, Right lower leg, Left lower leg, Buttocks     Bathing assist Assist Level: Moderate Assistance - Patient 50 - 74%     Upper Body Dressing/Undressing Upper body dressing   What is the patient wearing?: Pull over shirt    Upper body assist Assist Level: Minimal Assistance - Patient > 75%    Lower Body Dressing/Undressing Lower body dressing      What is the patient wearing?: Incontinence brief, Pants     Lower body assist Assist for lower body dressing: Maximal Assistance - Patient 25 - 49%     Toileting Toileting    Toileting assist Assist for toileting: Maximal Assistance - Patient 25 - 49%  Transfers Chair/bed transfer  Transfers assist     Chair/bed transfer assist level: Minimal Assistance - Patient > 75%     Locomotion Ambulation   Ambulation assist      Assist level: Minimal Assistance - Patient > 75% Assistive device: Walker-rolling Max distance: 18ft   Walk 10 feet activity   Assist     Assist level: Minimal Assistance - Patient > 75% Assistive device: Walker-rolling   Walk 50 feet activity   Assist Walk 50 feet with 2 turns activity did not occur: Safety/medical concerns         Walk 150 feet activity   Assist Walk 150 feet activity did not occur: Safety/medical concerns         Walk 10 feet on uneven surface  activity   Assist  Walk 10 feet on uneven surfaces activity did not occur: Safety/medical concerns         Wheelchair     Assist Will patient use wheelchair at discharge?: No   Wheelchair activity did not occur: N/A         Wheelchair 50 feet with 2 turns activity    Assist    Wheelchair 50 feet with 2 turns activity did not occur: N/A       Wheelchair 150 feet activity     Assist  Wheelchair 150 feet activity did not occur: N/A       Blood pressure (!) 151/59, pulse (!) 52, temperature 97.7 F (36.5 C), resp. rate 15, height 5' (1.524 m), weight 69 kg, SpO2 96 %.  Medical Problem List and Plan: 1.  L hemiparesis and visual deficits secondary to R frontoparietal infarcts/patchy infarcts             -patient may  shower             -ELOS/Goals: 14-21 days- Supervision to min A after exam  -Continue CIR  2.  Impaired mobility, ambulating 20-100 feet -DVT/anticoagulation:  Pharmaceutical: Continue Lovenox             -antiplatelet therapy: 3. Headaches/Pain Management: Will schedule tylenol qid to help with HA/Pain 4. Mood: LCSW to follow for evaluation and support. Team to provide ego support.              -antipsychotic agents: N/a 5. Neuropsych: This patient is possibly not capable of making decisions on her own behalf. 6. Skin/Wound Care: . Routine pressure relief measures.  7. Fluids/Electrolytes/Nutrition: Monitor I/O. Check lytes in am 8. HTN: Monitor BP tid--continue Norvasc, catapres and cozaar.             --will check orthostatic vitals as reporting dizziness with transitional movements Vitals:   01/13/21 0525 01/13/21 0748  BP: (!) 161/57 (!) 151/59  Pulse: (!) 57 (!) 52  Resp: 17 15  Temp: 97.8 F (36.6 C) 97.7 F (36.5 C)  SpO2: 95% 96%   Labile . Cont to monitor  9. T2DM: Hgb A1c-. 8.8 and poorly controlled. Continue dose of levermir tonight --Will resume amaryl today and metformin tomorrow (48 hours post dye) -CBG (last 3)  Recent Labs     01/12/21 2051 01/13/21 0606 01/13/21 1131  GLUCAP 112* 134* 180*  CBGs slightly elevated, increase metformin to 1000mg  BID.   10. Hypoxia: Educated on pulmonary hygeine. --Question fluid overload--Grade 1 DD w/mild AVR/sclerosis.               --CXR ordered with daily weights for monitoring. 11.  Dyslipidemia: 12.  H/o Depression: Continue  Zoloft. 13. Hypothyroid: Managed with supplementation.  14. Pt doesn't speak [place]- from [place]- daughter and grandson speak English-  15. Bradycardia: decreased amlodipine to 5mg  16. Abdominal pain: UA negative, UC with >100,000 lactobacillus species without sensitivities. Will not treat since pain has resolved.  17. Diarrhea: senna/Miralax d/ced.  18. Leukocytosis: resolved WBC down to 10K today .  UA -, afeb. Repeat CBC today 19. Hypokalemia: resolved with supplementation, edited diet order to request fresh fruit daily (she likes bananas), repeat Monday. Repeat K+ today.  20. Cervical myofascial pain: appreciate massage by Thursday ordered. 21. Hypomagnesemia: supplement 2gm IV yesterday, improving. Oral scheduled supplement also started. Repeat magnesium today.  22. Thrush: nystatin ordered 23. Left wrist pain, has history of fracture in 2021: voltaren gel ordered scheduled, wrist brace and XR ordered,   LOS: 8 days A FACE TO FACE EVALUATION WAS PERFORMED  2022 P [place] Kosch 01/13/2021, 12:01 PM

## 2021-01-13 NOTE — Progress Notes (Signed)
Occupational Therapy Weekly Progress Note  Patient Details  Name: Jo Larson MRN: [id number] Date of Birth: [birth year]  Beginning of progress report period: January 06, 2021 End of progress report period: January 13, 2021  Today's Date: 01/13/2021 OT Individual Time: 0900-1018 OT Individual Time Calculation (min): 78 min    Patient has met 2 of 3 short term goals.  Pt is progressing slowly and steadily towards goals with a lot of encouragment.  Barriers to progress include the below deficits as well as apparent post stroke apathy, incontinence, and language barrier.  Pt does show improved alertness since initial evaluation and increased use of LUE however requires max multimodal cues to attend to and use extremity during functional tasks.   Patient continues to demonstrate the following deficits: muscle weakness, decreased cardiorespiratoy endurance, impaired timing and sequencing, unbalanced muscle activation, motor apraxia, decreased coordination, and decreased motor planning, decreased visual perceptual skills, decreased attention to left and left side neglect, decreased initiation, decreased attention, decreased awareness, decreased problem solving, decreased safety awareness, decreased memory, and delayed processing, and decreased sitting balance, decreased standing balance, decreased postural control, hemiplegia, and decreased balance strategies and therefore will continue to benefit from skilled OT intervention to enhance overall performance with BADL.  Patient progressing toward long term goals..  Continue plan of care.  OT Short Term Goals Week 1:  OT Short Term Goal 1 (Week 1): Pt will complete LB dressing with mod assist. OT Short Term Goal 1 - Progress (Week 1): Met OT Short Term Goal 2 (Week 1): Pt will complete toilet transfer with min assist OT Short Term Goal 2 - Progress (Week 1): Progressing toward goal OT Short Term Goal 3 (Week 1): Pt will complete bathing with mod assist. OT  Short Term Goal 3 - Progress (Week 1): Met Week 2:  OT Short Term Goal 1 (Week 2): STGs=LTGs due to ELOS  Skilled Therapeutic Interventions/Progress Updates:    Pt semi upright in bed, interpreter present to assist with communication throughout session.  Pt c/o headache, no number provided when therapist asked.  Pt requesting pain medication, nurse made aware.  Pt then requesting to have a bowel movement on the toilet.    Pt required max multimodal cues and encouragement to initiate bed mobility with increased independence with mod assist for left roll and sidelying to sit.  Pt complete stand pivot EOB to w/c using RW however pt prematurely leaving AD and also prematurely attempting to sit needing mod assist to safely descent to seat.  Stand pivot from w/c to 3 in 1 commode using grab bar with mod assist as well.  Toileting required max assist in standing.  Squat pivot 3 in 1 commode to w/c needing mod assist with pt also attempting to sit prematurely and resisting therapists effort to assist pt to safely complete transfer however therapist was ultimately able to safely bring pt over to w/c.  Discussed transfer technique with pt and educated her on proper body mechanics for improved performance during future attempts.    Pt requesting to wash up at sink due to taking a shower per pt makes her head hurt.  Pt required max encouragement and multimodal cues to facilitate functional use of and attention to LUE throughout sinkside self care.  Pt also frequently looking at therapist and interpreter for assistance in initiating and sequencing of familiar tasks and she required question cues to encourage active participation in cognitive components of tasks. She completed UB//LB bathing and dressing with mod assist overall  at sit<>stand level at sink. Pt sitting up in w/c, call bell in reach, seat alarm on at end of session.  Therapy Documentation Precautions:  Precautions Precautions: Fall Precaution Comments:  Non-English speaking. Needs interpreter - [place] Restrictions Weight Bearing Restrictions: No     Therapy/Group: Individual Therapy  Ezekiel Slocumb 01/13/2021, 12:16 PM

## 2021-01-13 NOTE — Discharge Instructions (Addendum)
Inpatient Rehab Discharge Instructions  Annie Durkin Discharge date and time:  01/20/21  Activities/Precautions/ Functional Status: Activity: activity as tolerated Diet: cardiac diet and diabetic diet--food has to be chopped Wound Care: none needed   Functional status:  ___ No restrictions     ___ Walk up steps independently _X__ 24/7 supervision/assistance   ___ Walk up steps with assistance ___ Intermittent supervision/assistance  ___ Bathe/dress independently ___ Walk with walker     _X__ Bathe/dress with assistance ___ Walk Independently    ___ Shower independently ___ Walk with assistance    ___ Shower with assistance _X__ No alcohol     ___ Return to work/school ________   Special Instructions: Check blood sugars 2-3 times a day before meals and/or at bedtime. Take results with you to Dr. Leonie Green office. 2. You will need to buy aspirin over the counter.     COMMUNITY REFERRALS UPON DISCHARGE:    Home Health:   PT & OT                Agency:[facility] Phone:[phone]   Medical Equipment/Items Ordered:                                                 Agency/Supplier:   STROKE/TIA DISCHARGE INSTRUCTIONS SMOKING Cigarette smoking nearly doubles your risk of having a stroke & is the single most alterable risk factor  If you smoke or have smoked in the last 12 months, you are advised to quit smoking for your health. Most of the excess cardiovascular risk related to smoking disappears within a year of stopping. Ask you doctor about anti-smoking medications [place] Quit Line: 1-800-QUIT NOW Free Smoking Cessation Classes [phone]  CHOLESTEROL Know your levels; limit fat & cholesterol in your diet  Lipid Panel     Component Value Date/Time   CHOL 153 12/31/2020 0301   TRIG 73 01/05/2021 0555   HDL 31 (L) 12/31/2020 0301   CHOLHDL 4.9 12/31/2020 0301   VLDL 47 (H) 12/31/2020 0301   LDLCALC 75 12/31/2020 0301     Many patients benefit from treatment  even if their cholesterol is at goal. Goal: Total Cholesterol (CHOL) less than 160 Goal:  Triglycerides (TRIG) less than 150 Goal:  HDL greater than 40 Goal:  LDL (LDLCALC) less than 100   BLOOD PRESSURE American Stroke Association blood pressure target is less that 120/80 mm/Hg  Your discharge blood pressure is:  BP: (!) 155/57 Monitor your blood pressure Limit your salt and alcohol intake Many individuals will require more than one medication for high blood pressure  DIABETES (A1c is a blood sugar average for last 3 months) Goal HGBA1c is under 7% (HBGA1c is blood sugar average for last 3 months)  Diabetes:     Lab Results  Component Value Date   HGBA1C 8.8 (H) 01/01/2021    Your HGBA1c can be lowered with medications, healthy diet, and exercise. Check your blood sugar as directed by your physician Call your physician if you experience unexplained or low blood sugars.  PHYSICAL ACTIVITY/REHABILITATION Goal is 30 minutes at least 4 days per week  Activity: Increase activity slowly, and No driving, Therapies: See above Return to work: N/A Activity decreases your risk of heart attack and stroke and makes your heart stronger.  It helps control your weight and blood pressure; helps you relax and  can improve your mood. Participate in a regular exercise program. Talk with your doctor about the best form of exercise for you (dancing, walking, swimming, cycling).  DIET/WEIGHT Goal is to maintain a healthy weight  Your discharge diet is:  Diet Order             DIET DYS 2 Room service appropriate? Yes with Assist; Fluid consistency: Thin  Diet effective now                   liquids Your height is:  Height: 5' (152.4 cm) Your current weight is: Weight: 70.4 kg Your Body Mass Index (BMI) is:  BMI (Calculated): 30.31 Following the type of diet specifically designed for you will help prevent another stroke. Your goal weight range is:  128 lbs Your goal Body Mass Index (BMI) is  19-24. Healthy food habits can help reduce 3 risk factors for stroke:  High cholesterol, hypertension, and excess weight.  RESOURCES Stroke/Support Group:  Call [phone]   STROKE EDUCATION PROVIDED/REVIEWED AND GIVEN TO PATIENT Stroke warning signs and symptoms How to activate emergency medical system (call 911). Medications prescribed at discharge. Need for follow-up after discharge. Personal risk factors for stroke. Pneumonia vaccine given:  Flu vaccine given:  My questions have been answered, the writing is legible, and I understand these instructions.  I will adhere to these goals & educational materials that have been provided to me after my discharge from the hospital.      My questions have been answered and I understand these instructions. I will adhere to these goals and the provided educational materials after my discharge from the hospital.  Patient/Caregiver Signature _______________________________ Date __________  Clinician Signature _______________________________________ Date __________  Please bring this form and your medication list with you to all your follow-up doctor's appointments.

## 2021-01-13 NOTE — Progress Notes (Signed)
Occupational Therapy Session Note  Patient Details  Name: Jo Larson MRN: [id number] Date of Birth: [birth year]  Today's Date: 01/13/2021 OT Individual Time: 1100-1130 OT Individual Time Calculation (min): 30 min    Short Term Goals: Week 1:  OT Short Term Goal 1 (Week 1): Pt will complete LB dressing with mod assist. OT Short Term Goal 1 - Progress (Week 1): Met OT Short Term Goal 2 (Week 1): Pt will complete toilet transfer with min assist OT Short Term Goal 2 - Progress (Week 1): Progressing toward goal OT Short Term Goal 3 (Week 1): Pt will complete bathing with mod assist. OT Short Term Goal 3 - Progress (Week 1): Met  Skilled Therapeutic Interventions/Progress Updates:    Pt received in w/c with in-person interpreter present. Pt reports she is very tired today. Pt taken down to day room and instructed in picking up wash cloths with LUE and placing them on table. Pt dropped multiple wash cloths, then instructed to slow down and cues for proper step by step sequencing. Pt then became more successful with this task and completed 10 wash cloth transports with rest breaks in between to increase LUE function and strength for ADLs. Pt then instructed to fold washcloths to increase BUE manipulation. After tx, pt wheeled back to room and left with all needs met, family and NT present, seatbelt alarm on and all needs met.   Therapy Documentation Precautions:  Precautions Precautions: Fall Precaution Comments: Non-English speaking. Needs interpreter - [place] Restrictions Weight Bearing Restrictions: No  Pain: Pain Assessment Pain Scale: 0-10 Pain Score: 5  Pain Type: Acute pain Pain Location: Head Pain Descriptors / Indicators: Headache Pain Intervention(s): Medication (See eMAR)    Therapy/Group: Individual Therapy  [name] 01/13/2021, 12:43 PM

## 2021-01-14 LAB — CBC WITH DIFFERENTIAL/PLATELET
Abs Immature Granulocytes: 0.38 10*3/uL — ABNORMAL HIGH (ref 0.00–0.07)
Basophils Absolute: 0.1 10*3/uL (ref 0.0–0.1)
Basophils Relative: 0 %
Eosinophils Absolute: 0.2 10*3/uL (ref 0.0–0.5)
Eosinophils Relative: 1 %
HCT: 30.7 % — ABNORMAL LOW (ref 36.0–46.0)
Hemoglobin: 9.5 g/dL — ABNORMAL LOW (ref 12.0–15.0)
Immature Granulocytes: 2 %
Lymphocytes Relative: 19 %
Lymphs Abs: 2.9 10*3/uL (ref 0.7–4.0)
MCH: 28.4 pg (ref 26.0–34.0)
MCHC: 30.9 g/dL (ref 30.0–36.0)
MCV: 91.9 fL (ref 80.0–100.0)
Monocytes Absolute: 0.7 10*3/uL (ref 0.1–1.0)
Monocytes Relative: 5 %
Neutro Abs: 11.3 10*3/uL — ABNORMAL HIGH (ref 1.7–7.7)
Neutrophils Relative %: 73 %
Platelets: 574 10*3/uL — ABNORMAL HIGH (ref 150–400)
RBC: 3.34 MIL/uL — ABNORMAL LOW (ref 3.87–5.11)
RDW: 14.2 % (ref 11.5–15.5)
WBC: 15.6 10*3/uL — ABNORMAL HIGH (ref 4.0–10.5)
nRBC: 0 % (ref 0.0–0.2)

## 2021-01-14 LAB — GLUCOSE, CAPILLARY
Glucose-Capillary: 140 mg/dL — ABNORMAL HIGH (ref 70–99)
Glucose-Capillary: 112 mg/dL — ABNORMAL HIGH (ref 70–99)
Glucose-Capillary: 182 mg/dL — ABNORMAL HIGH (ref 70–99)
Glucose-Capillary: 147 mg/dL — ABNORMAL HIGH (ref 70–99)

## 2021-01-14 MED ORDER — MAGNESIUM SULFATE IN D5W 1-5 GM/100ML-% IV SOLN
1.0000 g | Freq: Once | INTRAVENOUS | Status: AC
  Administered 2021-01-14: 1 g via INTRAVENOUS
  Filled 2021-01-14: qty 100

## 2021-01-14 NOTE — Progress Notes (Signed)
PROGRESS NOTE   Subjective/Complaints: No complaints this morning Shared with Britta Mccreedy her complaints of dizziness and wrist pain- brace has been delivered.  WBC increased again yesterday but afebrile. Will repeat today to trend.   ROS- c/o left wrist pain- improved, + dizziness, denies any more abdominal pain  Objective:   DG Wrist Complete Left  Result Date: 01/12/2021 CLINICAL DATA:  Wrist pain EXAM: LEFT WRIST - COMPLETE 3+ VIEW COMPARISON:  05/23/2020 FINDINGS: Old fracture deformity of the distal radius. Old fracture deformity of the ulnar styloid. No definite acute displaced fracture is seen. Degenerative changes at the first [facility] joint. IMPRESSION: Old fracture deformities of the distal radius and ulna. No definite acute osseous abnormality Electronically Signed   By: Jasmine Pang M.D.   On: 01/12/2021 17:36   Recent Labs    01/13/21 1304  WBC 12.0*  HGB 9.3*  HCT 29.8*  PLT 477*    Recent Labs    01/13/21 1304  NA 135  K 5.0  CL 103  CO2 23  GLUCOSE 206*  BUN 29*  CREATININE 0.94  CALCIUM 9.3     Intake/Output Summary (Last 24 hours) at 01/14/2021 1224 Last data filed at 01/14/2021 0700 Gross per 24 hour  Intake 600 ml  Output --  Net 600 ml        Physical Exam: Vital Signs Blood pressure 134/62, pulse 61, temperature 98.4 F (36.9 C), temperature source Oral, resp. rate 14, height 5' (1.524 m), weight 69.6 kg, SpO2 95 %. Gen: no distress, normal appearing HEENT: oral mucosa pink and moist, NCAT, thrush improved Cardio: Bradycardic Chest: normal effort, normal rate of breathing Abd: soft, non-distended Ext: no edema Psych: pleasant, normal affect Skin: intact   Musculoskeletal:    Cervical back: Normal range of motion and neck supple. Brace applied to left wrist.   Neurological:    Mental Status: She is awake and alert    Comments: Dysarthric Motor strength is 2/5 minus left upper  extremity, 4/5 left lower extremity, 4/5 right upper and right lower extremity.  Psychiatric:    Comments: Became mildly agitated during motor exam but could be redirected.  Did not want her left arm to be touched.  Assessment/Plan: 1. Functional deficits which require 3+ hours per day of interdisciplinary therapy in a comprehensive inpatient rehab setting. Physiatrist is providing close team supervision and 24 hour management of active medical problems listed below. Physiatrist and rehab team continue to assess barriers to discharge/monitor patient progress toward functional and medical goals  Care Tool:  Bathing    Body parts bathed by patient: Left arm, Chest, Abdomen, Front perineal area, Right upper leg, Left upper leg, Face   Body parts bathed by helper: Right arm, Right lower leg, Left lower leg, Buttocks     Bathing assist Assist Level: Moderate Assistance - Patient 50 - 74%     Upper Body Dressing/Undressing Upper body dressing   What is the patient wearing?: Pull over shirt    Upper body assist Assist Level: Minimal Assistance - Patient > 75%    Lower Body Dressing/Undressing Lower body dressing      What is the patient wearing?: Incontinence brief,  Pants     Lower body assist Assist for lower body dressing: Maximal Assistance - Patient 25 - 49%     Toileting Toileting    Toileting assist Assist for toileting: Maximal Assistance - Patient 25 - 49%     Transfers Chair/bed transfer  Transfers assist     Chair/bed transfer assist level: Minimal Assistance - Patient > 75%     Locomotion Ambulation   Ambulation assist      Assist level: Minimal Assistance - Patient > 75% Assistive device: Walker-rolling Max distance: 39ft   Walk 10 feet activity   Assist     Assist level: Minimal Assistance - Patient > 75% Assistive device: Walker-rolling   Walk 50 feet activity   Assist Walk 50 feet with 2 turns activity did not occur: Safety/medical  concerns         Walk 150 feet activity   Assist Walk 150 feet activity did not occur: Safety/medical concerns         Walk 10 feet on uneven surface  activity   Assist Walk 10 feet on uneven surfaces activity did not occur: Safety/medical concerns         Wheelchair     Assist Will patient use wheelchair at discharge?: No   Wheelchair activity did not occur: N/A         Wheelchair 50 feet with 2 turns activity    Assist    Wheelchair 50 feet with 2 turns activity did not occur: N/A       Wheelchair 150 feet activity     Assist  Wheelchair 150 feet activity did not occur: N/A       Blood pressure 134/62, pulse 61, temperature 98.4 F (36.9 C), temperature source Oral, resp. rate 14, height 5' (1.524 m), weight 69.6 kg, SpO2 95 %.  Medical Problem List and Plan: 1.  L hemiparesis and visual deficits secondary to R frontoparietal infarcts/patchy infarcts             -patient may  shower             -ELOS/Goals: 14-21 days- Supervision to min A after exam  -Continue CIR  2.  Impaired mobility, ambulating 20-100 feet -DVT/anticoagulation:  Pharmaceutical: Continue Lovenox             -antiplatelet therapy: 3. Headaches/Pain Management: Will schedule tylenol qid to help with HA/Pain 4. Mood: LCSW to follow for evaluation and support. Team to provide ego support.              -antipsychotic agents: N/a 5. Neuropsych: This patient is possibly not capable of making decisions on her own behalf. 6. Skin/Wound Care: . Routine pressure relief measures.  7. Fluids/Electrolytes/Nutrition: Monitor I/O. Check lytes in am 8. HTN: Monitor BP tid--continue Norvasc, catapres and cozaar.             --will check orthostatic vitals as reporting dizziness with transitional movements Vitals:   01/14/21 0459 01/14/21 0830  BP: (!) 158/54 134/62  Pulse: (!) 57 61  Resp: 18 14  Temp: 97.8 F (36.6 C) 98.4 F (36.9 C)  SpO2: 97% 95%   Labile . Cont to  monitor  9. T2DM: Hgb A1c-. 8.8 and poorly controlled. Continue dose of levermir tonight --Will resume amaryl today and metformin tomorrow (48 hours post dye) -CBG (last 3)  Recent Labs    01/13/21 2105 01/14/21 0603 01/14/21 1140  GLUCAP 94 140* 112*  CBGs slightly elevated, increase metformin to 1000mg  BID.  Much better controlled.  10. Hypoxia: Educated on pulmonary hygeine. --Question fluid overload--Grade 1 DD w/mild AVR/sclerosis.               --CXR ordered with daily weights for monitoring. 11.  Dyslipidemia: 12.  H/o Depression: Continue Zoloft. 13. Hypothyroid: Managed with supplementation.  14. Pt doesn't speak [place]- from [place]- daughter and grandson speak English-  15. Bradycardia: decreased amlodipine to 5mg  16. Abdominal pain: UA negative, UC with >100,000 lactobacillus species without sensitivities. Will not treat since pain has resolved.  17. Diarrhea: senna/Miralax d/ced.  18. Leukocytosis: resolved WBC down to 10K today .  UA -, afeb. Up to 12 on 6/24, repeat today 19. Hypokalemia: resolved with supplementation, edited diet order to request fresh fruit daily (she likes bananas), repeat Monday.  20. Cervical myofascial pain: appreciate massage by Sunday ordered. 21. Hypomagnesemia: supplement 1gm IV today, improving. Oral scheduled supplement also started. Repeat magnesium tomorrow 22. Thrush: nystatin ordered 23. Left wrist pain, has history of fracture in 2021: voltaren gel ordered scheduled, wrist brace and XR ordered,   LOS: 9 days A FACE TO FACE EVALUATION WAS PERFORMED  2022 Romero Letizia 01/14/2021, 12:24 PM

## 2021-01-14 NOTE — Progress Notes (Signed)
Physical Therapy Session Note  Patient Details  Name: Jo Larson MRN: [id number] Date of Birth: 05-29-1932  Today's Date: 01/14/2021 PT Individual Time: 1000-1100 PT Individual Time Calculation (min): 60 min   Short Term Goals: Week 1:  PT Short Term Goal 1 (Week 1): Pt will complete bed mobility with minA PT Short Term Goal 1 - Progress (Week 1): Met PT Short Term Goal 2 (Week 1): Pt will complete bed<>chair transfers with minA and LRAD PT Short Term Goal 2 - Progress (Week 1): Met PT Short Term Goal 3 (Week 1): Pt will ambulate 55f with minA and LRAD PT Short Term Goal 3 - Progress (Week 1): Met PT Short Term Goal 4 (Week 1): Pt will initiate stair training PT Short Term Goal 4 - Progress (Week 1): Met Week 2:  PT Short Term Goal 1 (Week 2): STG = LTG due to ELOS Week 3:     Skilled Therapeutic Interventions/Progress Updates:  Pain:  Pt reports R hip pain, no number given.  Pain w/activity.  Treatment to tolerance.  Rest breaks and repositioning as needed.  Pt initially supine and agreeable to treatment session w/focus on mobility.  Interpreter arrived during session to assist w/communication. Dons pants w/mod assist. Supine to sit w/min assist. Dons sweater and headscarf w/min assist. stand pivot transfer to wc w/min assist/HHA. Pt transported to gym. Gait 831fw/HHA/min assist, cues for LLE clearance/step length/attention, cues for posture, heavily flexed posture somewhat age related/premorbid in nature. Standing at stairs w/UE support worked on tapping 5in step w/heel w/LLE only to promote step height/heelstrike/clearance.  Rpeated x 12 then c/o R hip pain, discontinued. Seated rest x several min. Gait x [id number] as above, improved LLE clearance w/second trial. In parallel bars sidestepping 8ft38fR x 1, max cues to maintain alignment of LLE w/task.  Pt provided w/18X16 wc w/applewood insert, legrests adjusted by therapist for improved seating/positioning, ease of  transitioning. Pt transported to room at end of session. Pt c/o nausea, began vomiting, basin provided and  nursing notified and arrived to assist pt as PT session timed out. Pt left oob in wc w/alarm belt set and needs in reach  Therapy Documentation Precautions:  Precautions Precautions: Fall Precaution Comments: Non-English speaking. Needs interpreter - [place] Weight Bearing Restrictions: No     Therapy/Group: Individual Therapy BarbCallie Fielding  [place]5/2022, 4:36 PM

## 2021-01-14 NOTE — Progress Notes (Signed)
Orthopedic Tech Progress Note Patient Details:  Jo Larson Aug 02, 1931 [id number]  Ortho Devices Type of Ortho Device: Velcro wrist splint Ortho Device/Splint Location: LUE Ortho Device/Splint Interventions: Application, Ordered, Adjustment   Post Interventions Patient Tolerated: Well  Steward Sames A Breeanne Oblinger 01/14/2021, 8:39 AM

## 2021-01-15 LAB — MAGNESIUM: Magnesium: 1.9 mg/dL (ref 1.7–2.4)

## 2021-01-15 LAB — GLUCOSE, CAPILLARY
Glucose-Capillary: 158 mg/dL — ABNORMAL HIGH (ref 70–99)
Glucose-Capillary: 123 mg/dL — ABNORMAL HIGH (ref 70–99)
Glucose-Capillary: 87 mg/dL (ref 70–99)
Glucose-Capillary: 119 mg/dL — ABNORMAL HIGH (ref 70–99)

## 2021-01-15 MED ORDER — AMOXICILLIN 250 MG/5ML PO SUSR
250.0000 mg | Freq: Three times a day (TID) | ORAL | Status: DC
  Administered 2021-01-15 – 2021-01-18 (×10): 250 mg via ORAL
  Filled 2021-01-15 (×14): qty 5

## 2021-01-15 MED ORDER — MAGNESIUM SULFATE IN D5W 1-5 GM/100ML-% IV SOLN
1.0000 g | Freq: Once | INTRAVENOUS | Status: AC
  Administered 2021-01-15: 1 g via INTRAVENOUS
  Filled 2021-01-15: qty 100

## 2021-01-15 NOTE — Progress Notes (Signed)
PROGRESS NOTE   Subjective/Complaints: Magnesium 1.9- supplemented 1gram IV Patient reports no complaints Slight fever overnight, WBC rising, urine foul smelling amoxicillin ordered for lactobacillus UTI  ROS- c/o left wrist pain- improved, + dizziness, denies any more abdominal pain, +foul smelling urine as per RN  Objective:   No results found. Recent Labs    01/13/21 1304 01/14/21 1429  WBC 12.0* 15.6*  HGB 9.3* 9.5*  HCT 29.8* 30.7*  PLT 477* 574*    Recent Labs    01/13/21 1304  NA 135  K 5.0  CL 103  CO2 23  GLUCOSE 206*  BUN 29*  CREATININE 0.94  CALCIUM 9.3     Intake/Output Summary (Last 24 hours) at 01/15/2021 1404 Last data filed at 01/15/2021 1300 Gross per 24 hour  Intake 900 ml  Output --  Net 900 ml        Physical Exam: Vital Signs Blood pressure (!) 146/58, pulse (!) 56, temperature 98.9 F (37.2 C), resp. rate 17, height 5' (1.524 m), weight 69 kg, SpO2 93 %. Gen: no distress, normal appearing HEENT: oral mucosa pink and moist, NCAT, thrush improved Cardio: Bradycardic Chest: normal effort, normal rate of breathing Abd: soft, non-distended Ext: no edema Psych: pleasant, normal affect, smiling Skin: intact   Musculoskeletal:    Cervical back: Normal range of motion and neck supple. Brace applied to left wrist.   Neurological:    Mental Status: She is awake and alert    Comments: Dysarthric Motor strength is 3/5 minus left upper extremity, 4/5 left lower extremity, 4/5 right upper and right lower extremity.  Psychiatric:    Comments: Became mildly agitated during motor exam but could be redirected.  Did not want her left arm to be touched (has pain from prior fracture)  Assessment/Plan: 1. Functional deficits which require 3+ hours per day of interdisciplinary therapy in a comprehensive inpatient rehab setting. Physiatrist is providing close team supervision and 24 hour  management of active medical problems listed below. Physiatrist and rehab team continue to assess barriers to discharge/monitor patient progress toward functional and medical goals  Care Tool:  Bathing    Body parts bathed by patient: Left arm, Chest, Abdomen, Front perineal area, Right upper leg, Left upper leg, Face   Body parts bathed by helper: Right arm, Right lower leg, Left lower leg, Buttocks     Bathing assist Assist Level: Moderate Assistance - Patient 50 - 74%     Upper Body Dressing/Undressing Upper body dressing   What is the patient wearing?: Pull over shirt    Upper body assist Assist Level: Minimal Assistance - Patient > 75%    Lower Body Dressing/Undressing Lower body dressing      What is the patient wearing?: Incontinence brief, Pants     Lower body assist Assist for lower body dressing: Maximal Assistance - Patient 25 - 49%     Toileting Toileting    Toileting assist Assist for toileting: Maximal Assistance - Patient 25 - 49%     Transfers Chair/bed transfer  Transfers assist     Chair/bed transfer assist level: Minimal Assistance - Patient > 75%     Locomotion Ambulation  Ambulation assist      Assist level: Minimal Assistance - Patient > 75% Assistive device: Hand held assist Max distance: 85   Walk 10 feet activity   Assist     Assist level: Minimal Assistance - Patient > 75% Assistive device: Hand held assist   Walk 50 feet activity   Assist Walk 50 feet with 2 turns activity did not occur: Safety/medical concerns  Assist level: Minimal Assistance - Patient > 75% Assistive device: Hand held assist    Walk 150 feet activity   Assist Walk 150 feet activity did not occur: Safety/medical concerns         Walk 10 feet on uneven surface  activity   Assist Walk 10 feet on uneven surfaces activity did not occur: Safety/medical concerns         Wheelchair     Assist Will patient use wheelchair at  discharge?: No   Wheelchair activity did not occur: N/A         Wheelchair 50 feet with 2 turns activity    Assist    Wheelchair 50 feet with 2 turns activity did not occur: N/A       Wheelchair 150 feet activity     Assist  Wheelchair 150 feet activity did not occur: N/A       Blood pressure (!) 146/58, pulse (!) 56, temperature 98.9 F (37.2 C), resp. rate 17, height 5' (1.524 m), weight 69 kg, SpO2 93 %.  Medical Problem List and Plan: 1.  L hemiparesis and visual deficits secondary to R frontoparietal infarcts/patchy infarcts             -patient may  shower             -ELOS/Goals: 14-21 days- Supervision to min A after exam  -Continue CIR  2.  Impaired mobility, ambulating 85 feet: Continue Lovenox             -antiplatelet therapy: 3. Headaches/Pain Management: Will schedule tylenol qid to help with HA/Pain 4. Mood: LCSW to follow for evaluation and support. Team to provide ego support.              -antipsychotic agents: N/a 5. Neuropsych: This patient is possibly not capable of making decisions on her own behalf. 6. Skin/Wound Care: . Routine pressure relief measures.  7. Fluids/Electrolytes/Nutrition: Monitor I/O. Check lytes in am 8. HTN: Monitor BP tid--continue Norvasc, catapres and cozaar.             --will check orthostatic vitals as reporting dizziness with transitional movements Vitals:   01/14/21 1920 01/15/21 0448  BP: 131/67 (!) 146/58  Pulse: 61 (!) 56  Resp: 18 17  Temp: 99.7 F (37.6 C) 98.9 F (37.2 C)  SpO2: 99% 93%   Labile . Cont to monitor  9. T2DM: Hgb A1c-. 8.8 and poorly controlled. Continue dose of levermir tonight --Will resume amaryl today and metformin tomorrow (48 hours post dye) -CBG (last 3)  Recent Labs    01/14/21 2056 01/15/21 0600 01/15/21 1126  GLUCAP 147* 158* 123*  CBGs slightly elevated, increase metformin to 1000mg  BID. Much better controlled.  10. Hypoxia: Educated on pulmonary hygeine. --Question  fluid overload--Grade 1 DD w/mild AVR/sclerosis.               --CXR ordered with daily weights for monitoring. 11.  Dyslipidemia: 12.  H/o Depression: Continue Zoloft. 13. Hypothyroid: Managed with supplementation.  14. Pt doesn't speak - from [place]- daughter and grandson  speak English-  15. Bradycardia: decreased amlodipine to 5mg  16. Abdominal pain: UA negative, UC with >100,000 lactobacillus species without sensitivities. Will not treat since pain has resolved.  17. Diarrhea: senna/Miralax d/ced.  18. UTI: started ampicillin 6/26.  19. Hypokalemia: resolved with supplementation, edited diet order to request fresh fruit daily (she likes bananas), repeat Monday.  20. Cervical myofascial pain: appreciate massage by Monday ordered. 21. Hypomagnesemia: supplement 1gm IV today, improving. Oral scheduled supplement also started. Repeat magnesium tomorrow 22. Thrush: resolved with nystatin.  23. Left wrist pain, has history of fracture in 2021: voltaren gel ordered scheduled, wrist brace and XR ordered- no new fractures.   LOS: 10 days A FACE TO FACE EVALUATION WAS PERFORMED  2022 P Velena Keegan 01/15/2021, 2:04 PM

## 2021-01-15 NOTE — Progress Notes (Signed)
Pt IV leaking when flushed. Not intact. 22G removed from L forearm. Tip intact. Nurse attempted 22G in R forearm x 1, unsuccessful. IV consulted

## 2021-01-16 LAB — CBC WITH DIFFERENTIAL/PLATELET
Abs Immature Granulocytes: 0.2 10*3/uL — ABNORMAL HIGH (ref 0.00–0.07)
Basophils Absolute: 0.1 10*3/uL (ref 0.0–0.1)
Basophils Relative: 1 %
Eosinophils Absolute: 0.2 10*3/uL (ref 0.0–0.5)
Eosinophils Relative: 2 %
HCT: 30.3 % — ABNORMAL LOW (ref 36.0–46.0)
Hemoglobin: 9.5 g/dL — ABNORMAL LOW (ref 12.0–15.0)
Immature Granulocytes: 2 %
Lymphocytes Relative: 26 %
Lymphs Abs: 3 10*3/uL (ref 0.7–4.0)
MCH: 29 pg (ref 26.0–34.0)
MCHC: 31.4 g/dL (ref 30.0–36.0)
MCV: 92.4 fL (ref 80.0–100.0)
Monocytes Absolute: 0.8 10*3/uL (ref 0.1–1.0)
Monocytes Relative: 7 %
Neutro Abs: 7.4 10*3/uL (ref 1.7–7.7)
Neutrophils Relative %: 62 %
Platelets: 508 10*3/uL — ABNORMAL HIGH (ref 150–400)
RBC: 3.28 MIL/uL — ABNORMAL LOW (ref 3.87–5.11)
RDW: 14.1 % (ref 11.5–15.5)
WBC: 11.7 10*3/uL — ABNORMAL HIGH (ref 4.0–10.5)
nRBC: 0 % (ref 0.0–0.2)

## 2021-01-16 LAB — BASIC METABOLIC PANEL
Anion gap: 9 (ref 5–15)
BUN: 25 mg/dL — ABNORMAL HIGH (ref 8–23)
CO2: 25 mmol/L (ref 22–32)
Calcium: 9.5 mg/dL (ref 8.9–10.3)
Chloride: 103 mmol/L (ref 98–111)
Creatinine, Ser: 0.8 mg/dL (ref 0.44–1.00)
GFR, Estimated: >60 mL/min (ref >60)
Glucose, Bld: 98 mg/dL (ref 70–99)
Potassium: 4.4 mmol/L (ref 3.5–5.1)
Sodium: 137 mmol/L (ref 135–145)

## 2021-01-16 LAB — GLUCOSE, CAPILLARY
Glucose-Capillary: 105 mg/dL — ABNORMAL HIGH (ref 70–99)
Glucose-Capillary: 168 mg/dL — ABNORMAL HIGH (ref 70–99)
Glucose-Capillary: 111 mg/dL — ABNORMAL HIGH (ref 70–99)
Glucose-Capillary: 136 mg/dL — ABNORMAL HIGH (ref 70–99)

## 2021-01-16 LAB — MAGNESIUM: Magnesium: 1.8 mg/dL (ref 1.7–2.4)

## 2021-01-16 NOTE — Progress Notes (Signed)
Physical Therapy Session Note  Patient Details  Name: Jo Larson MRN: [id number] Date of Birth: [birth year]  Today's Date: 01/16/2021 PT Individual Time: 0900-1000 PT Individual Time Calculation (min): 60 min   Short Term Goals: Week 2:  PT Short Term Goal 1 (Week 2): STG = LTG due to ELOS  Skilled Therapeutic Interventions/Progress Updates:     Pt greeted supine in bed to start session. Sleeping soundly but awakens to voice. Used [employer] interpreter for first ~10 minutes of session until in person interpreter arrives. Pt denies pain but reports fatigue and needs convincing to participate in OOB therapies. Supine<>sit completed with supervision. At this point, family arrives in the room (daughter-in-law and family friend). Focused remainder of session on family training and education. Stand<>pivot transfer completed with CGA to w/c but needs cues for safety approach and fully turning to sit as she sits prematurely. Wheeled to day room rehab gym for time management. Daughter-in-law assisted with donning jacket and head scarf. Gait training 2x70f with minA via HHA to R hand with family actively observing. Instructed daughter-in-law on guarding technique and use of gait belt for ambulation - daughter-in-law provided HHA and pt ambulated ~595fwith 2 turns with therapist providing additional CGA for safety. Pt continues to require max cues for safety approach to sitting and has difficulty turning fully to chair to sit and educated family on this. Family with plenty of questions regarding follow up care, personal care, etc at DC. Retrieved CSW BeJacqlyn Larseno assist with answering these questions and collaborated with her to answer questions as well - family appreciative. Pt returned to her room in w/c and remained seated in w/c with all needs met, safety belt alarm on, awaiting upcoming OT session  Therapy Documentation Precautions:  Precautions Precautions: Fall Precaution Comments: Non-English speaking.  Needs interpreter - [place] Weight Bearing Restrictions: No General:     Therapy/Group: Individual Therapy  ChAlger Simons/27/2022, 7:43 AM

## 2021-01-16 NOTE — Progress Notes (Signed)
Speech Language Pathology Daily Session Note  Patient Details  Name: Jo Larson MRN: [id number] Date of Birth: 07-Nov-1931  Today's Date: 01/16/2021 SLP Individual Time: 1332-1430 SLP Individual Time Calculation (min): 58 min  Short Term Goals: Week 2: SLP Short Term Goal 1 (Week 2): Patient will be consistently oriented to place, time, and situation with min A verbal cues across 3 treatment sessions SLP Short Term Goal 2 (Week 2): Patient will recall basic, daily information with Min A verbal cues SLP Short Term Goal 3 (Week 2): Patient will demonstrate selective attention by attending to functional tasks with Min A verbal cues for re-direction SLP Short Term Goal 4 (Week 2): Patient will demonstrate the ability to adequately self-monitor swallowing skills (reduce rate of consumption, reduce bolus size) and perform appropriate compensatory techniques (lingual sweep) to safely consume Dys 2 diet and thin liquids with Min A verbal cues.  Skilled Therapeutic Interventions:Skilled ST services focused on cognitive skills. Pt was orientated to all but date and year. Pt recall swallow strategies with mod A verbal cues. Pt was able to recall call bell function and SLP provided education to utilize call bell if she wants to get in the chair and out of the bed. Pt was unable to see large picture card clearly even with provided reader glasses from CIR. Pt's interpretor expressed she will ask daughter to bring in glasses. SLP facilitated short term recall skills in fact recall following 1-2 sentences following the use of visualization and repetition strategies. Pt was able to recall 1 fact from 2 out 3, 1 sentence stories with mod A fading to min A verbal cues and but unable to recall facts from 2 sentence stories. SLP educated pt to focus attention when attempting to recall functional information to aid in storage of information. All questions answered to satisfaction. Pt was left in room with call bell within  reach and bed alarm set. SLP recommends to continue skilled services.     Pain Pain Assessment Pain Score: 0-No pain  Therapy/Group: Individual Therapy  Lashawn Bromwell  [facility] 01/16/2021, 4:02 PM

## 2021-01-16 NOTE — Progress Notes (Signed)
Occupational Therapy Session Note  Patient Details  Name: Jo Larson MRN: [id number] Date of Birth: Jun 28, 1932  Today's Date: 01/16/2021 OT Individual Time: 1005-1105 OT Individual Time Calculation (min): 60 min    Short Term Goals: Week 2:  OT Short Term Goal 1 (Week 2): STGs=LTGs due to ELOS   Skilled Therapeutic Interventions/Progress Updates:    Pt sitting up in w/c with dtr in law, friend of family, and interpreter present. Focus of session today was on caregiver education for toilet transfers, toileting, and shower bench transfers at ambulation level.  Pts dtr in law provided hand held assist to ambulate a few feet to bathroom and for toilet transfer.  Pt utilized grab bar for balance and therapist discussed with dtr in law and determined she will be able to use counter on right side at home for balance.  Pt encouraged to complete clothing management however pt did not follow commands to complete therefore needing total assist to pull down over hips performed by dtr in law.  Pt did participate in pulling pants and brief up over hips however still requiring mod assist to complete.  Pts dtr in law required Vcs and Tcs to improve positioning when assisting pt back to w/c with HHA.    Pt then transported to ADL bathroom and educated pt and dtr in law on tub bench transfer at tub shower level. Pt ambulated into bathroom with dtr in law providing CGA and hand held assist. Dtr in law had to provide mod assist to lift BLE over tub lip.  Pt transferred off of tub bench in same manner. Discussed with CSW recommendations for shower bench at discharge.  Ambulated back to w/c, however noted attempting to sit prematurely and needing therapist to offer min assist during stand to sit.  Educated pts dtr in law on safe positioning during pivot >stand to sit transfer.  Pt transported back to room via w/c, needs within reach, seat alarm on.    Therapy Documentation Precautions:  Precautions Precautions:  Fall Precaution Comments: Non-English speaking. Needs interpreter - [place] Restrictions Weight Bearing Restrictions: No    Therapy/Group: Individual Therapy  Amie Critchley 01/16/2021, 12:49 PM

## 2021-01-16 NOTE — Progress Notes (Signed)
Patient ID: Nataya Keilman, female   DOB: 1931-08-04, 87 y.o.   MRN: [id number]  Met with pt, daughter in-law friend and interpreter who has questions regarding pt's care and their options. They wanted to know if she could go to a NH from here. Informed them with her [facility] insurance she an not but her Medicaid she could with no therapies just room and board. Made aware early in her stay she would require 24/7 care at discharge. Can set up [facility] referral aware may take 3-4 weeks to initiate. Daughter in-law needs to go back to first shift in a month currently on second shift. PT present and will incorpate them in therapy today.

## 2021-01-16 NOTE — Progress Notes (Addendum)
Inpatient Rehabilitation Care Coordinator Discharge Note  The overall goal for the admission was met for:   Discharge location: Yes-HOME WITH DAUGHTER IN-LAW AND GRANDCHILDREN  Length of Stay: Yes. 14 days.  Discharge activity level: Yes-MIN OVERALL  Home/community participation: Yes. Limited.   Services provided included: MD, RD, PT, OT, SLP, RN, CM, Pharmacy, and SW  Financial Services: Medicaid and Private Insurance: [place] offered to/list presented to:PT AND DAUGHTER IN-LAW  Follow-up services arranged: Home Health: [place], DME: [facility], 3 IN 1 AND [facility], and Patient/Family has no preference for HH/DME agencies  Comments (or additional information):DAUGHTER IN-LAW WORKS SECOND SHIFT SO SOMEONE IS ALWAYS THERE WITH PT. PCS REFERRAL MADE. MADE AWARE WILL NEED 24/7 CARE. FAMILY EDUCATION COMPLETED 6/30  Patient/Family verbalized understanding of follow-up arrangements: Yes  Individual responsible for coordination of the follow-up plan: [facility] [place] [phone]  Confirmed correct DME delivered: Elease Hashimoto 01/16/2021    [place], Gardiner Rhyme

## 2021-01-17 LAB — GLUCOSE, CAPILLARY
Glucose-Capillary: 138 mg/dL — ABNORMAL HIGH (ref 70–99)
Glucose-Capillary: 161 mg/dL — ABNORMAL HIGH (ref 70–99)
Glucose-Capillary: 167 mg/dL — ABNORMAL HIGH (ref 70–99)
Glucose-Capillary: 128 mg/dL — ABNORMAL HIGH (ref 70–99)

## 2021-01-17 MED ORDER — MAGNESIUM SULFATE 2 GM/50ML IV SOLN
2.0000 g | Freq: Once | INTRAVENOUS | Status: AC
  Administered 2021-01-17: 2 g via INTRAVENOUS
  Filled 2021-01-17: qty 50

## 2021-01-17 NOTE — Progress Notes (Signed)
Patient ID: Jo Larson, female   DOB: 1932-03-06, 87 y.o.   MRN: [id number]  Spoke with daughter in-law to update regarding team conference progress toward goals and discharge still 7/1. She plans to come in for education Thursday did begin training Monday when here. Pt should do better at home due to in own environment and with language barrier. Does better when family is here. Work toward Friday discharge.

## 2021-01-17 NOTE — Progress Notes (Signed)
Physical Therapy Session Note  Patient Details  Name: Jo Larson MRN: [id number] Date of Birth: June 20, 1932  Today's Date: 01/17/2021 PT Individual Time: 1415-1501 PT Individual Time Calculation (min): 46 min   Short Term Goals: Week 2:  PT Short Term Goal 1 (Week 2): STG = LTG due to ELOS  Skilled Therapeutic Interventions/Progress Updates:  Patient supine in bed on entrance to room. Interpreter present. Patient alert and agreeable to PT session. Patient denied any new pain during session. Related to pt this PT's family history from [place] and pt perks slightly with noted increase in participation.  Therapeutic Activity: Bed Mobility: Patient performed supine --> sit with supervision and sit--> supine with increased vc for technique and Min A for LLE to bed surface. While seated EOB, pt is able to don sweater with Min/ Mod A for LUE and zipper. Max A for donning shoes.  Transfers: Patient performed STS, SPVT, and toilet transfers with HHA and CGA. Fair to good balance in standing with RUE HHA. Provided verbal cues for completing turn to seat and reaching back for armrests.   Gait Training:  Patient ambulated 110 feet using HHA to R hand and CGA/ Min A with no AD. Demonstrated light drag of LLE. Provided vc/ visual cue for increasing step height/ length with LLE and good return noted from pt. While walking, pt looks into each room to L asking if we are to "turn here?" Guided to stay straight ahead. Intend to return ambulate to room with pt stating she must go to the bathroom. Returned to bathroom via w/c for time.   Patient supine  in bed at end of session with brakes locked, bed alarm set, and all needs within reach. Drinks on tray table to her R side and pt informed as to location of each drink since she is cleared to consume thin liquids without supervision.      Therapy Documentation Precautions:  Precautions Precautions: Fall Precaution Comments: Non-English speaking. Needs  interpreter - [place] Restrictions Weight Bearing Restrictions: No  Therapy/Group: Individual Therapy  Loel Dubonnet PT, DPT 01/17/2021, 6:12 PM

## 2021-01-17 NOTE — Progress Notes (Addendum)
Occupational Therapy Session Note  Patient Details  Name: Jo Larson MRN: [id number] Date of Birth: 1932/05/07  Today's Date: 01/17/2021 OT Individual Time: 0915-1010 OT Individual Time Calculation (min): 55 min    Short Term Goals:  Week 2:  OT Short Term Goal 1 (Week 2): STGs=LTGs due to ELOS  Skilled Therapeutic Interventions/Progress Updates:    Pt asleep in bed with interpreter present for assist with communication.  Pt reports no pain throughout session.  Pt repeatedly stating during session "Poor me, poor me", requiring significant encouragement and cueing to actively participate and encourage decision making.  Pt completed supine to sit with CGA.  Stand pivot transfer with min assist and hand held assist. Pt needing step by step question cues to move through sequence of sinkside bathing and dressing tasks. Pt doffed shirt at sinkside with supervision.  Bathed UB with left hand over hand assist when washing right side of body and left thigh.  Pt able to raise LUE in order to wash underarm with supervision only.  Pt donned shirt with min assist.  Pt stood with CGA for balance at sink and required mod assist to doff soiled brief and overall LB bathing.  Due to time constraints of therapist, LB dressing completed with total assist.  Half lap tray placed to support LUE, call bell in reach, seat alarm on.  Therapy Documentation Precautions:  Precautions Precautions: Fall Precaution Comments: Non-English speaking. Needs interpreter - [place] Restrictions Weight Bearing Restrictions: No    Therapy/Group: Individual Therapy  Amie Critchley 01/17/2021, 10:18 AM

## 2021-01-17 NOTE — Progress Notes (Signed)
Pt IV dislodged yesterday. Nurse attempted to place new IV x 1 without success. IV team consulted

## 2021-01-17 NOTE — Plan of Care (Signed)
  Problem: RH Balance Goal: LTG Patient will maintain dynamic standing balance (PT) Description: LTG:  Patient will maintain dynamic standing balance with assistance during mobility activities (PT) Flowsheets (Taken 01/17/2021 1110) LTG: Pt will maintain dynamic standing balance during mobility activities with:: Minimal Assistance - Patient > 75% Note: Downgraded due to slow progress   Problem: Sit to Stand Goal: LTG:  Patient will perform sit to stand with assistance level (PT) Description: LTG:  Patient will perform sit to stand with assistance level (PT) Flowsheets (Taken 01/17/2021 1110) LTG: PT will perform sit to stand in preparation for functional mobility with assistance level: Minimal Assistance - Patient > 75% Note: Downgraded due to slow progress   Problem: RH Bed to Chair Transfers Goal: LTG Patient will perform bed/chair transfers w/assist (PT) Description: LTG: Patient will perform bed to chair transfers with assistance (PT). Flowsheets (Taken 01/17/2021 1110) LTG: Pt will perform Bed to Chair Transfers with assistance level: Minimal Assistance - Patient > 75% Note: Downgraded due to slow progress   Problem: RH Car Transfers Goal: LTG Patient will perform car transfers with assist (PT) Description: LTG: Patient will perform car transfers with assistance (PT). Flowsheets (Taken 01/17/2021 1110) LTG: Pt will perform car transfers with assist:: Minimal Assistance - Patient > 75% Note: Downgraded due to slow progress   Problem: RH Ambulation Goal: LTG Patient will ambulate in controlled environment (PT) Description: LTG: Patient will ambulate in a controlled environment, # of feet with assistance (PT). Flowsheets (Taken 01/17/2021 1110) LTG: Pt will ambulate in controlled environ  assist needed:: Minimal Assistance - Patient > 75% LTG: Ambulation distance in controlled environment: 43ft Note: Downgraded distance due to slow progress   Problem: RH Stairs Goal: LTG Patient  will ambulate up and down stairs w/assist (PT) Description: LTG: Patient will ambulate up and down # of stairs with assistance (PT) Flowsheets (Taken 01/17/2021 1110) LTG: Pt will ambulate up/down stairs assist needed:: Minimal Assistance - Patient > 75% LTG: Pt will  ambulate up and down number of stairs: 2 Note: Downgraded number of steps to reflect pt's home entrance

## 2021-01-17 NOTE — Progress Notes (Signed)
PROGRESS NOTE   Subjective/Complaints: Sleepy  No complaints WBC improving SBP elevated to 150s- IV magnesium today should help. Diastolic soft.   ROS- c/o left wrist pain- improved, + dizziness, denies any more abdominal pain-improved, +foul smelling urine as per RN- resolved  Objective:   No results found. Recent Labs    01/14/21 1429 01/16/21 0506  WBC 15.6* 11.7*  HGB 9.5* 9.5*  HCT 30.7* 30.3*  PLT 574* 508*    Recent Labs    01/16/21 0506  NA 137  K 4.4  CL 103  CO2 25  GLUCOSE 98  BUN 25*  CREATININE 0.80  CALCIUM 9.5     Intake/Output Summary (Last 24 hours) at 01/17/2021 0929 Last data filed at 01/17/2021 0841 Gross per 24 hour  Intake 80 ml  Output --  Net 80 ml        Physical Exam: Vital Signs Blood pressure (!) 155/54, pulse (!) 56, temperature 98 F (36.7 C), temperature source Oral, resp. rate 18, height 5' (1.524 m), weight 68.6 kg, SpO2 94 %. Gen: no distress, normal appearing HEENT: oral mucosa pink and moist, NCAT, thrush improved Cardio: Bradycardia Chest: normal effort, normal rate of breathing Abd: soft, non-distended Ext: no edema Psych: pleasant, normal affect  Musculoskeletal:    Cervical back: Normal range of motion and neck supple. Brace applied to left wrist.   Neurological:    Mental Status: She is awake and alert    Comments: Dysarthric Motor strength is 3/5 minus left upper extremity, 4/5 left lower extremity, 4/5 right upper and right lower extremity.  Psychiatric:    Comments: Became mildly agitated during motor exam but could be redirected.  Did not want her left arm to be touched (has pain from prior fracture)  Assessment/Plan: 1. Functional deficits which require 3+ hours per day of interdisciplinary therapy in a comprehensive inpatient rehab setting. Physiatrist is providing close team supervision and 24 hour management of active medical problems listed  below. Physiatrist and rehab team continue to assess barriers to discharge/monitor patient progress toward functional and medical goals  Care Tool:  Bathing    Body parts bathed by patient: Left arm, Chest, Abdomen, Front perineal area, Right upper leg, Left upper leg, Face   Body parts bathed by helper: Right arm, Right lower leg, Left lower leg, Buttocks     Bathing assist Assist Level: Moderate Assistance - Patient 50 - 74%     Upper Body Dressing/Undressing Upper body dressing   What is the patient wearing?: Pull over shirt    Upper body assist Assist Level: Minimal Assistance - Patient > 75%    Lower Body Dressing/Undressing Lower body dressing      What is the patient wearing?: Incontinence brief, Pants     Lower body assist Assist for lower body dressing: Maximal Assistance - Patient 25 - 49%     Toileting Toileting    Toileting assist Assist for toileting: Maximal Assistance - Patient 25 - 49%     Transfers Chair/bed transfer  Transfers assist     Chair/bed transfer assist level: Minimal Assistance - Patient > 75%     Locomotion Ambulation   Ambulation assist  Assist level: Minimal Assistance - Patient > 75% Assistive device: Hand held assist Max distance: 85   Walk 10 feet activity   Assist     Assist level: Minimal Assistance - Patient > 75% Assistive device: Hand held assist   Walk 50 feet activity   Assist Walk 50 feet with 2 turns activity did not occur: Safety/medical concerns  Assist level: Minimal Assistance - Patient > 75% Assistive device: Hand held assist    Walk 150 feet activity   Assist Walk 150 feet activity did not occur: Safety/medical concerns         Walk 10 feet on uneven surface  activity   Assist Walk 10 feet on uneven surfaces activity did not occur: Safety/medical concerns         Wheelchair     Assist Will patient use wheelchair at discharge?: No   Wheelchair activity did not  occur: N/A         Wheelchair 50 feet with 2 turns activity    Assist    Wheelchair 50 feet with 2 turns activity did not occur: N/A       Wheelchair 150 feet activity     Assist  Wheelchair 150 feet activity did not occur: N/A       Blood pressure (!) 155/54, pulse (!) 56, temperature 98 F (36.7 C), temperature source Oral, resp. rate 18, height 5' (1.524 m), weight 68.6 kg, SpO2 94 %.  Medical Problem List and Plan: 1.  L hemiparesis and visual deficits secondary to R frontoparietal infarcts/patchy infarcts             -patient may  shower             -ELOS/Goals: 14-21 days- Supervision to min A after exam  -Continue CIR  2.  Impaired mobility, ambulating 150 feet: Discontinue Lovenox             -antiplatelet therapy: 3. Headaches/Pain Management: Will schedule tylenol qid to help with HA/Pain 4. Mood: LCSW to follow for evaluation and support. Team to provide ego support.              -antipsychotic agents: N/a 5. Neuropsych: This patient is possibly not capable of making decisions on her own behalf. 6. Skin/Wound Care: . Routine pressure relief measures.  7. Fluids/Electrolytes/Nutrition: Monitor I/O. Check lytes in am 8. HTN: Monitor BP tid--continue Norvasc, catapres and cozaar.             --will check orthostatic vitals as reporting dizziness with transitional movements Vitals:   01/16/21 1953 01/17/21 0451  BP: (!) 155/65 (!) 155/54  Pulse: (!) 55 (!) 56  Resp: 19 18  Temp: 98 F (36.7 C) 98 F (36.7 C)  SpO2: 97% 94%   Labile: IV mag as below, continue current regimen 9. T2DM: Hgb A1c-. 8.8 and poorly controlled. Continue dose of levermir tonight --Will resume amaryl today and metformin tomorrow (48 hours post dye) -CBG (last 3)  Recent Labs    01/16/21 1641 01/16/21 2118 01/17/21 0515  GLUCAP 111* 136* 138*  CBGs slightly elevated, increase metformin to 1000mg  BID. Much better controlled. Continue current regimen.  10. Hypoxia: Educated  on pulmonary hygeine. --Question fluid overload--Grade 1 DD w/mild AVR/sclerosis.               --CXR ordered with daily weights for monitoring. 11.  Dyslipidemia: 12.  H/o Depression: Continue Zoloft. 13. Hypothyroid: Managed with supplementation.  14. Pt doesn't speak - from [place]-  daughter and grandson speak English-  15. Bradycardia: decreased amlodipine to 5mg  16. Abdominal pain: UA negative, UC with >100,000 lactobacillus species without sensitivities. See #18 17. Diarrhea: senna/Miralax d/ced.  18. UTI: started ampicillin 6/26, WBC trending down, conitnue 19. Hypokalemia: resolved with supplementation, edited diet order to request fresh fruit daily (she likes bananas) 20. Cervical myofascial pain: appreciate massage by 7/26 ordered. 21. Hypomagnesemia: supplement 2gm IV today, improving. Oral scheduled supplement also started. Repeat magnesium tomorrow 22. Thrush: resolved with nystatin.  23. Left wrist pain, has history of fracture in 2021: voltaren gel ordered scheduled, wrist brace and XR ordered- no new fractures.   LOS: 12 days A FACE TO FACE EVALUATION WAS PERFORMED  2022 Cranford Blessinger 01/17/2021, 9:29 AM

## 2021-01-17 NOTE — Patient Care Conference (Signed)
Inpatient RehabilitationTeam Conference and Plan of Care Update Date: 01/17/2021   Time: 13:02 PM    Patient Name: Jo Larson      Medical Record Number: [id number]  Date of Birth: 1932/01/19 Sex: Female         Room/Bed: 4W25C/4W25C-01 Payor Info: Payor: Advertising copywriter MEDICARE / Plan: [employer]HC MEDICARE / Product Type: *No Product type* /    Admit Date/Time:  01/05/2021  3:24 PM  Primary Diagnosis:  Stroke (cerebrum) [facility])  Hospital Problems: Principal Problem:   Stroke (cerebrum) (HCC) Active Problems:   Type 2 diabetes mellitus without complication, without long-term current use of insulin [facility])    Expected Discharge Date: Expected Discharge Date: 01/20/21  Team Members Present: Physician leading conference: Dr. Sula Soda Care Coodinator Present: Chana Bode, RN, BSN, CRRN;Becky Dupree, LCSW Nurse Present: Chana Bode, RN PT Present: Wynelle Link, PT OT Present: Dolphus Jenny, OT SLP Present: Colin Benton, SLP PPS Coordinator present : Fae Pippin, SLP     Current Status/Progress Goal Weekly Team Focus  Bowel/Bladder             Swallow/Nutrition/ Hydration   dys 2 textures and thin liquids, mod-min A  Supervision A  swallow strategies   ADL's             Mobility   supervision bed mobility, minA stand<>pivot transfers, gait ~72ft with minA via HHA  CGA to minA overall - downgraded goals to minA overall  Pt education, functional mobility, family education and training, stair training, endurance and overall activity tolerance, DC planning   Communication             Safety/Cognition/ Behavioral Observations  Supervision orientation, mod-min A comprehenison  Min-Supervision A,  orientation, recall, attention and auditory comprehenison   Pain             Skin               Discharge Planning:  Daughter in-law here Monday to see pt in therapies, concern will need care at DC. Options discussed and will take home and see how goes. has been  aware pt needed 24/7 physical care from day one   Team Discussion: Lactobacillus UTI treated. Left wrist pain addressed with brace and gel.Still note gross motor issues and progress limited by patient apathy towards trying or initiating activities. Patient has visual issues and has trouble following commands and seeing without her glasses. Team feels patient will do better in a more familiar environment.  Patient on target to meet rehab goals: Currently min assist to stand pivot and ambulates 72' with HHA. Unable to use walker at present due to previous left wrist injury pain. Was able to encourage use of hemi techniques and cues to use the arm. Currently min - mod assist for orientation and comprehension with supervision goals set for discharge with SLP.  *See Care Plan and progress notes for long and short-term goals.   Revisions to Treatment Plan:    Teaching Needs: Safety, supervision, toileting, transfers, medications, secondary stroke risk management, etc  Current Barriers to Discharge: Decreased caregiver support and Home enviroment access/layout  Possible Resolutions to Barriers: Family education 01/16/21 HH follow up services     Medical Summary Current Status: hypomagnesemia, prior left wrist fracture- residual pain, lactobacillus UTI, thrush, elevated systolic BP, lower diastolic BP, bradycardia  Barriers to Discharge: Medical stability  Barriers to Discharge Comments: hypomagnesemia, prior left wrist fracture- residual pain, lactobacillus UTI, thrush, elevated systolic BP, lower diastolic BP, bradycardia Possible  Resolutions to [employer]: continue daily magnesium check with IV and oral supplementation, continue voltaren gel and brace, continue ampicillin, continue nystatin, continue to monitor vitals TID   Continued Need for Acute Rehabilitation Level of Care: The patient requires daily medical management by a physician with specialized training in physical medicine  and rehabilitation for the following reasons: Direction of a multidisciplinary physical rehabilitation program to maximize functional independence : Yes Medical management of patient stability for increased activity during participation in an intensive rehabilitation regime.: Yes Analysis of laboratory values and/or radiology reports with any subsequent need for medication adjustment and/or medical intervention. : Yes   I attest that I was present, lead the team conference, and concur with the assessment and plan of the team.   Chana Bode B 01/17/2021, 4:55 PM

## 2021-01-17 NOTE — Progress Notes (Signed)
Physical Therapy Session Note  Patient Details  Name: Jo Larson MRN: [id number] Date of Birth: [birth year]  Today's Date: 01/17/2021 PT Individual Time: 1010-1059 PT Individual Time Calculation (min): 49 min   Short Term Goals: Week 2:  PT Short Term Goal 1 (Week 2): STG = LTG due to ELOS  Skilled Therapeutic Interventions/Progress Updates:    Pt received sitting in w/c - handoff of care from OT. Pt agreeable to PT tx and denies any pain but continues to report generalized fatigue and weakness. In person interpreter at bedside and present throughout session. W/c transport to main rehab gym for time management and energy conservation. Sit<>stand with minA via HHA to R hand and ambulated 69ft with minA via HHA to R hand - gait deficits include short shuffling steps with decreased gait speed and unsteadiness present, especially with turns or quick instructions. After extended seated rest break, she needed convincing to further participate due her "weakness" and fatigue. 2nd gait trial completed in same distance (51ft) with same assist level, cues provided for widening BOS, forward gaze, and safety awareness. Next, worked on Museum/gallery curator as pt has 2 "small" STE her home. Completed repeated step ups/downs (2x10) with minA and R hand rail - needs frequent instruction for sequencing (stepping up with R foot  and down with L foot) but she had great difficulty following these instructions (unclear if due to language barrier for motor planning deficits). She was returned to her room at end of session and was agreeable to remain seated in w/c at end of session. Safety belt alarm on and all needs within reach, LUE supported with 1/2 lap tray.  Therapy Documentation Precautions:  Precautions Precautions: Fall Precaution Comments: Non-English speaking. Needs interpreter - [place] Restrictions Weight Bearing Restrictions: No General:    Therapy/Group: Individual Therapy  Amiel Sharrow P Laquita Harlan  PT 01/17/2021, 7:36 AM

## 2021-01-17 NOTE — Progress Notes (Signed)
Speech Language Pathology Daily Session Note  Patient Details  Name: Jo Larson MRN: [id number] Date of Birth: Mar 16, 1932  Today's Date: 01/17/2021 SLP Individual Time: 1330-1415 SLP Individual Time Calculation (min): 45 min  Short Term Goals: Week 2: SLP Short Term Goal 1 (Week 2): Patient will be consistently oriented to place, time, and situation with min A verbal cues across 3 treatment sessions SLP Short Term Goal 2 (Week 2): Patient will recall basic, daily information with Min A verbal cues SLP Short Term Goal 3 (Week 2): Patient will demonstrate selective attention by attending to functional tasks with Min A verbal cues for re-direction SLP Short Term Goal 4 (Week 2): Patient will demonstrate the ability to adequately self-monitor swallowing skills (reduce rate of consumption, reduce bolus size) and perform appropriate compensatory techniques (lingual sweep) to safely consume Dys 2 diet and thin liquids with Min A verbal cues.  Skilled Therapeutic Interventions: Skilled ST intervention performed with focus on cognitive goals. Patient seen with interpreter. SLP facilitated card sorting task with Mod A verbal/visual cues fading to Min A verbal/visual cues for selective attention. Patient initially required more instruction to recall task instructions and initiation/persistence, however she demonstrated good carry over within task resulting in increased independence and improved self monitoring. Able to sustain attention to task for ~15 minutes with Min A verbal cues for encouragement. Overall she appeared more engaged and interactive with therapist as compared to previous sessions. Patient was left in bed with alarm activated and needs within reach. Continue per ST POC.    Pain Pain Assessment Pain Scale: 0-10 Pain Score: 0-No pain Faces Pain Scale: No hurt  Therapy/Group: Individual Therapy  [name] 01/17/2021, 2:12 PM

## 2021-01-18 DIAGNOSIS — I6330 Cerebral infarction due to thrombosis of unspecified cerebral artery: Secondary | ICD-10-CM

## 2021-01-18 DIAGNOSIS — I951 Orthostatic hypotension: Secondary | ICD-10-CM

## 2021-01-18 DIAGNOSIS — E119 Type 2 diabetes mellitus without complications: Secondary | ICD-10-CM

## 2021-01-18 LAB — MAGNESIUM: Magnesium: 1.7 mg/dL (ref 1.7–2.4)

## 2021-01-18 LAB — GLUCOSE, CAPILLARY
Glucose-Capillary: 164 mg/dL — ABNORMAL HIGH (ref 70–99)
Glucose-Capillary: 191 mg/dL — ABNORMAL HIGH (ref 70–99)
Glucose-Capillary: 112 mg/dL — ABNORMAL HIGH (ref 70–99)
Glucose-Capillary: 122 mg/dL — ABNORMAL HIGH (ref 70–99)

## 2021-01-18 NOTE — Progress Notes (Signed)
PROGRESS NOTE   Subjective/Complaints: Pt up in bed starting on breakfast. Indicated that she didn't have too much of an appetite. When I encouraged her to eat to build strength, she took fork and began to feed self.  ROS: limited due to language/communication   Objective:   No results found. Recent Labs    01/16/21 0506  WBC 11.7*  HGB 9.5*  HCT 30.3*  PLT 508*    Recent Labs    01/16/21 0506  NA 137  K 4.4  CL 103  CO2 25  GLUCOSE 98  BUN 25*  CREATININE 0.80  CALCIUM 9.5     Intake/Output Summary (Last 24 hours) at 01/18/2021 0854 Last data filed at 01/18/2021 0806 Gross per 24 hour  Intake 190 ml  Output 0 ml  Net 190 ml        Physical Exam: Vital Signs Blood pressure (!) 148/52, pulse (!) 55, temperature 97.9 F (36.6 C), temperature source Oral, resp. rate 20, height 5' (1.524 m), weight 69.8 kg, SpO2 96 %. Constitutional: No distress . Vital signs reviewed. HEENT: EOMI, oral membranes moist Neck: supple Cardiovascular: RRR without murmur. No JVD    Respiratory/Chest: CTA Bilaterally without wheezes or rales. Normal effort    GI/Abdomen: BS +, non-tender, non-distended Ext: no clubbing, cyanosis, or edema Psych: pleasant and generally cooperative Musculoskeletal:    Cervical back: Normal range of motion and neck supple. Brace applied to left wrist.   Neurological:    Mental Status: She is awake and alert    Comments: remains dysarthric Motor strength is 3/5 minus left upper extremity, 4/5 left lower extremity, 4/5 right upper and right lower extremity.    Assessment/Plan: 1. Functional deficits which require 3+ hours per day of interdisciplinary therapy in a comprehensive inpatient rehab setting. Physiatrist is providing close team supervision and 24 hour management of active medical problems listed below. Physiatrist and rehab team continue to assess barriers to discharge/monitor patient  progress toward functional and medical goals  Care Tool:  Bathing    Body parts bathed by patient: Left arm, Chest, Abdomen, Front perineal area, Right upper leg, Left upper leg, Face   Body parts bathed by helper: Right arm, Right lower leg, Left lower leg, Buttocks     Bathing assist Assist Level: Moderate Assistance - Patient 50 - 74%     Upper Body Dressing/Undressing Upper body dressing   What is the patient wearing?: Pull over shirt    Upper body assist Assist Level: Minimal Assistance - Patient > 75%    Lower Body Dressing/Undressing Lower body dressing      What is the patient wearing?: Incontinence brief, Pants     Lower body assist Assist for lower body dressing: Maximal Assistance - Patient 25 - 49%     Toileting Toileting    Toileting assist Assist for toileting: Maximal Assistance - Patient 25 - 49%     Transfers Chair/bed transfer  Transfers assist     Chair/bed transfer assist level: Minimal Assistance - Patient > 75%     Locomotion Ambulation   Ambulation assist      Assist level: Minimal Assistance - Patient > 75% Assistive device:  Hand held assist Max distance: 85   Walk 10 feet activity   Assist     Assist level: Minimal Assistance - Patient > 75% Assistive device: Hand held assist   Walk 50 feet activity   Assist Walk 50 feet with 2 turns activity did not occur: Safety/medical concerns  Assist level: Minimal Assistance - Patient > 75% Assistive device: Hand held assist    Walk 150 feet activity   Assist Walk 150 feet activity did not occur: Safety/medical concerns         Walk 10 feet on uneven surface  activity   Assist Walk 10 feet on uneven surfaces activity did not occur: Safety/medical concerns         Wheelchair     Assist Will patient use wheelchair at discharge?: No   Wheelchair activity did not occur: N/A         Wheelchair 50 feet with 2 turns activity    Assist     Wheelchair 50 feet with 2 turns activity did not occur: N/A       Wheelchair 150 feet activity     Assist  Wheelchair 150 feet activity did not occur: N/A       Blood pressure (!) 148/52, pulse (!) 55, temperature 97.9 F (36.6 C), temperature source Oral, resp. rate 20, height 5' (1.524 m), weight 69.8 kg, SpO2 96 %.  Medical Problem List and Plan: 1.  L hemiparesis and visual deficits secondary to R frontoparietal infarcts/patchy infarcts             -patient may  shower             -ELOS/Goals: 14-21 days- Supervision to min A after exam  -Continue CIR therapies including PT, OT, and SLP  2.  Impaired mobility, ambulating 150 feet: Discontinue Lovenox             -antiplatelet therapy: 3. Headaches/Pain Management: Will schedule tylenol qid to help with HA/Pain 4. Mood: LCSW to follow for evaluation and support. Team to provide ego support.              -antipsychotic agents: N/a 5. Neuropsych: This patient is possibly not capable of making decisions on her own behalf. 6. Skin/Wound Care: . Routine pressure relief measures.  7. Fluids/Electrolytes/Nutrition: Monitor I/O. Check lytes in am 8. HTN: Monitor BP tid--continue Norvasc, catapres and cozaar.             --orthostatics +   -TEDS, Abd binder Vitals:   01/17/21 2155 01/18/21 0401  BP: 125/71 (!) 148/52  Pulse: (!) 57 (!) 55  Resp:  20  Temp:  97.9 F (36.6 C)  SpO2: 96% 96%   Labile: IV mag as below, continue current regimen 9. T2DM: Hgb A1c-. 8.8 and poorly controlled. Continue dose of levermir tonight --Will resume amaryl today and metformin tomorrow (48 hours post dye) -CBG (last 3)  Recent Labs    01/17/21 1642 01/17/21 2121 01/18/21 0546  GLUCAP 167* 128* 164*  CBGs slightly elevated, increase metformin to 1000mg  BID.  -improving control 10. Hypoxia: Educated on pulmonary hygeine. --Question fluid overload--Grade 1 DD w/mild AVR/sclerosis.               --CXR ordered with daily weights for  monitoring. 11.  Dyslipidemia: 12.  H/o Depression: Continue Zoloft. 13. Hypothyroid: Managed with supplementation.  14. Pt doesn't speak - from [place]- daughter and grandson speak English-  15. Bradycardia: decreased amlodipine to 5mg  16. Abdominal pain:  improved on exam today 17. Diarrhea: senna/Miralax d/ced.  18. UTI: ucx + for lactobacillus, part of normal flora- has received 3 days of amoxil---dc 19. Hypokalemia: resolved with supplementation, edited diet order to request fresh fruit daily (she likes bananas) 20. Cervical myofascial pain: appreciate massage by Lendell Caprice ordered. 21. Hypomagnesemia: supplement 2gm IV given 6/28 and Mg++ still 1.7 today  -continue oral supplement  -recheck Mg++ tomorrow 6/30 22. Thrush: resolved with nystatin.  23. Left wrist pain, has history of fracture in 2021: voltaren gel ordered scheduled, wrist brace and XR ordered- no new fractures.   LOS: 13 days A FACE TO FACE EVALUATION WAS PERFORMED  Ranelle Oyster 01/18/2021, 8:54 AM

## 2021-01-18 NOTE — Progress Notes (Signed)
Nurse observed pt IV to right wrist dislodged. Nurse removed, tip intact. Site CDI

## 2021-01-18 NOTE — Progress Notes (Signed)
Physical Therapy Session Note  Patient Details  Name: Jo Larson MRN: [id number] Date of Birth: 08-10-1931  Today's Date: 01/18/2021 PT Individual Time: 1005-1100 + 1415-1445  PT Individual Time Calculation (min): 55 min  + 30 min  Short Term Goals: Week 2:  PT Short Term Goal 1 (Week 2): STG = LTG due to ELOS  Skilled Therapeutic Interventions/Progress Updates:     1st session: In person interpreter present throughout session. Handoff of care from OT to start session with pt finising up bathing in the shower. Pt sitting on shower transfer. Pt agreeable to PT tx and denies pain. Pt needing modA for donning R sock and totalA for donning her L sock. She required encouragement to actively participate in this. Required totalA for donning both slip on shoes. She ambulated with HHA and minA to the sink where she was instructed to brush her teeth/tongue (had h/o thrush and encouraged good oral hygiene). She required minA in standing while she completed this. Stand<>pivot transfer to the w/c but required modA for completing this as she attempted to sit prematurely before being over her sitting surface (this has been ongoing during her rehab despite max cues and education). Wheeled for time management to main rehab gym. Gait training 84f + 1040f+ 10074fith minA via HHA (to R hand) with cues provided for increasing L step length and L step height which she was able to correct but had difficulty maintaining). She completed furniture transfers from low sitting sofa couch with minA and HHA and attempted to complete bed mobility on flat bed in ADL apartment but bed height too high. Pt reporting increased fatigue at this point of this session and requested to return to her room and get in bed. Returned to room in w/c for energy conservation and completed stand<>pivot transfer with CGA back to bed. Required minA for sit>supine and cues for symmetrical repositioning in bed. Pt made comfortable and all needs  within reach at end of session, bed alarm on.   2nd session: Pt greeted supine in bed to start session, agreeable to PT tx. No reports of pain. In person interpreter at bedside and used throughout session. Supine<>sit with HOB flat without use of bed rails, completed with supervision. Donned shoes, jacket, and head-scarf with totalA for time management. Completed stand<>pivot transfer to w/c with CGA and use of no AD. Wheeled for time management to day room rehab gym. Sit<>Stand with CGA and R HHA and ambulated ~[id number] nurses station with minA and HHA to R hand ( pt unable to make full lap around nurses station, began to fade with fatigue and BLE weakness). Cues during gait for increased L step height/clearance/length and maintaining forward gaze. Completed x4 minutes of Nustep using BLE and RUE (c/o L wrist pain when attempting to use on Nustep), workload set to 1. She needed AAROM to understand movement patterns on the Nustep. Stand<>pivot transfer with minA back to w/c and wheeled to room. Stand<>pivot transfer with CGA back to bed and removed jacket with modA and able to kick off her shoes without assist. Sit>Supine with minA for symmetrical lying. All needs met and bed alarm on at end of session.  Therapy Documentation Precautions:  Precautions Precautions: Fall Precaution Comments: Non-English speaking. Needs interpreter - [place] Weight Bearing Restrictions: No General:    Therapy/Group: Individual Therapy  Jo Simons9/2022, 7:34 AM

## 2021-01-18 NOTE — Progress Notes (Addendum)
Occupational Therapy Session Note  Patient Details  Name: Jo Larson MRN: [id number] Date of Birth: 04-29-1932  Today's Date: 01/18/2021 OT Individual Time: 0905-1005 OT Individual Time Calculation (min): 60 min    Short Term Goals:  Week 2:  OT Short Term Goal 1 (Week 2): STGs=LTGs due to ELOS   Skilled Therapeutic Interventions/Progress Updates:    Pt asleep in bed, easily aroused awake.  No c/o pain throughout session.  Interpreter present during session for assist with communication.  Therapist provided questions cues to facilitate pt decision making and initiation of self care tasks.  Pt repetitively responding "whatever you want me to do".  Pt able to make simple decision when provided two choices related to various self care tasks needing max Vcs and encouragement. Pt completed supine to sit with CGA. Sit to stand and ambulation using hand held assist and contact guard.  Pt completed toilet transfer as well with CGA and grab bar at 3 in 1 commode.  Toileting required step by step Vcs for sequencing of pericare and clothing mgt and min assist to pull pants completely down over hips.  Pt ambulated and stand to sit with HHA and contact guard to shower bench in walk-in shower.   Pt doffed shirt and gripper socks with supervision. Bathed UB and LB with min assist and step by step question cues due to pt frequently looking at interpreter and therapist for next step in sequence asking "what do you want me to do next?".  Pt able to state next step of sequence when provided with encouragement and 2 to 3 question cues to assist with reasoning and problem solving. After drying off, pt donned shirt with min assist.  Pants donned with max assist due to time constraint of therapist and PT arriving for next session.  Direct hand off to PT in bathroom.   Pt exhibited improved functional use of and attention to LUE today during bathing and dressing tasks, however still requires significant amount of  cueing and encouragement to facilitate maximal performance likely secondary to impaired cognition including ability to sequence, problem solve, and reason, as well as appearing somewhat apathetic towards participation.    Therapy Documentation Precautions:  Precautions Precautions: Fall Precaution Comments: Non-English speaking. Needs interpreter - [place] Restrictions Weight Bearing Restrictions: No    Therapy/Group: Individual Therapy  Amie Critchley 01/18/2021, 9:29 AM

## 2021-01-18 NOTE — Progress Notes (Signed)
Speech Language Pathology Daily Session Note  Patient Details  Name: Jo Larson MRN: [id number] Date of Birth: 1931-11-25  Today's Date: 01/18/2021 SLP Individual Time: 1330-1415 SLP Individual Time Calculation (min): 45 min  Short Term Goals: Week 2: SLP Short Term Goal 1 (Week 2): Patient will be consistently oriented to place, time, and situation with min A verbal cues across 3 treatment sessions SLP Short Term Goal 2 (Week 2): Patient will recall basic, daily information with Min A verbal cues SLP Short Term Goal 3 (Week 2): Patient will demonstrate selective attention by attending to functional tasks with Min A verbal cues for re-direction SLP Short Term Goal 4 (Week 2): Patient will demonstrate the ability to adequately self-monitor swallowing skills (reduce rate of consumption, reduce bolus size) and perform appropriate compensatory techniques (lingual sweep) to safely consume Dys 2 diet and thin liquids with Min A verbal cues.  Skilled Therapeutic Interventions:Skilled ST services focused on cognitive skills. SLP facilitated auditory comprehension and basic problem solving skills in 3 step ADL picture sequence task, pt required total A unable to comprehension concept even with multiple demonstrations and step by step instructions. Pt was eventually able to sequence 2 step picture cards with max A fading to mod A verbal cues. Pt was left in room with call bell within reach and bed alarm set. SLP recommends to continue skilled services.     Pain Pain Assessment Pain Scale: 0-10 Pain Score: 0-No pain Faces Pain Scale: No hurt  Therapy/Group: Individual Therapy  Ashyra Cantin  [facility] 01/18/2021, 1:08 PM

## 2021-01-19 LAB — GLUCOSE, CAPILLARY
Glucose-Capillary: 138 mg/dL — ABNORMAL HIGH (ref 70–99)
Glucose-Capillary: 163 mg/dL — ABNORMAL HIGH (ref 70–99)
Glucose-Capillary: 148 mg/dL — ABNORMAL HIGH (ref 70–99)
Glucose-Capillary: 130 mg/dL — ABNORMAL HIGH (ref 70–99)

## 2021-01-19 LAB — MAGNESIUM: Magnesium: 1.6 mg/dL — ABNORMAL LOW (ref 1.7–2.4)

## 2021-01-19 MED ORDER — MAGNESIUM SULFATE 2 GM/50ML IV SOLN
2.0000 g | Freq: Once | INTRAVENOUS | Status: AC
  Administered 2021-01-19: 2 g via INTRAVENOUS
  Filled 2021-01-19: qty 50

## 2021-01-19 NOTE — Progress Notes (Signed)
PROGRESS NOTE   Subjective/Complaints: No complaints this morning Up and smiling.   ROS: Limited due to language/communication   Objective:   No results found. No results for input(s): WBC, HGB, HCT, PLT in the last 72 hours.   No results for input(s): NA, K, CL, CO2, GLUCOSE, BUN, CREATININE, CALCIUM in the last 72 hours.    Intake/Output Summary (Last 24 hours) at 01/19/2021 1207 Last data filed at 01/19/2021 0818 Gross per 24 hour  Intake 210 ml  Output --  Net 210 ml        Physical Exam: Vital Signs Blood pressure (!) 161/55, pulse (!) 55, temperature 97.9 F (36.6 C), resp. rate 16, height 5' (1.524 m), weight 73.3 kg, SpO2 96 %. Gen: no distress, normal appearing HEENT: oral mucosa pink and moist, NCAT Cardio: Reg rate Chest: normal effort, normal rate of breathing Abd: soft, non-distended Ext: no edema Psych: pleasant, normal affect Skin: intact Musculoskeletal:    Cervical back: Normal range of motion and neck supple. Brace applied to left wrist.   Neurological:    Mental Status: She is awake and alert    Comments: remains dysarthric Motor strength is 3/5 minus left upper extremity, 4/5 left lower extremity, 4/5 right upper and right lower extremity.    Assessment/Plan: 1. Functional deficits which require 3+ hours per day of interdisciplinary therapy in a comprehensive inpatient rehab setting. Physiatrist is providing close team supervision and 24 hour management of active medical problems listed below. Physiatrist and rehab team continue to assess barriers to discharge/monitor patient progress toward functional and medical goals  Care Tool:  Bathing    Body parts bathed by patient: Left arm, Chest, Abdomen, Front perineal area, Right upper leg, Left upper leg, Face   Body parts bathed by helper: Right arm, Right lower leg, Left lower leg, Buttocks     Bathing assist Assist Level: Minimal  Assistance - Patient > 75%     Upper Body Dressing/Undressing Upper body dressing   What is the patient wearing?: Pull over shirt    Upper body assist Assist Level: Minimal Assistance - Patient > 75%    Lower Body Dressing/Undressing Lower body dressing      What is the patient wearing?: Pants, Incontinence brief     Lower body assist Assist for lower body dressing: Moderate Assistance - Patient 50 - 74%     Toileting Toileting    Toileting assist Assist for toileting: Minimal Assistance - Patient > 75%     Transfers Chair/bed transfer  Transfers assist     Chair/bed transfer assist level: Minimal Assistance - Patient > 75%     Locomotion Ambulation   Ambulation assist      Assist level: Minimal Assistance - Patient > 75% Assistive device: Hand held assist Max distance: 150ft   Walk 10 feet activity   Assist     Assist level: Minimal Assistance - Patient > 75% Assistive device: Hand held assist   Walk 50 feet activity   Assist Walk 50 feet with 2 turns activity did not occur: Safety/medical concerns  Assist level: Minimal Assistance - Patient > 75% Assistive device: Hand held assist    Walk  150 feet activity   Assist Walk 150 feet activity did not occur: Safety/medical concerns         Walk 10 feet on uneven surface  activity   Assist Walk 10 feet on uneven surfaces activity did not occur: Safety/medical concerns   Assist level: Minimal Assistance - Patient > 75%     Wheelchair     Assist Will patient use wheelchair at discharge?: No (Transport chair)   Wheelchair activity did not occur: N/A         Wheelchair 50 feet with 2 turns activity    Assist    Wheelchair 50 feet with 2 turns activity did not occur: N/A       Wheelchair 150 feet activity     Assist  Wheelchair 150 feet activity did not occur: N/A       Blood pressure (!) 161/55, pulse (!) 55, temperature 97.9 F (36.6 C), resp. rate 16,  height 5' (1.524 m), weight 73.3 kg, SpO2 96 %.  Medical Problem List and Plan: 1.  L hemiparesis and visual deficits secondary to R frontoparietal infarcts/patchy infarcts             -patient may  shower             -ELOS/Goals: 14-21 days- Supervision to min A after exam  -Continue CIR therapies including PT, OT, and SLP  2.  Impaired mobility, ambulating 150 feet: Discontinue Lovenox             -antiplatelet therapy: 3. Headaches/Pain Management: Will schedule tylenol qid to help with HA/Pain 4. Mood: LCSW to follow for evaluation and support. Team to provide ego support.              -antipsychotic agents: N/a 5. Neuropsych: This patient is possibly not capable of making decisions on her own behalf. 6. Skin/Wound Care: . Routine pressure relief measures.  7. Fluids/Electrolytes/Nutrition: Monitor I/O. Check lytes in am 8. HTN: Monitor BP tid--continue Norvasc, catapres and cozaar.             --orthostatics +   -TEDS, Abd binder Vitals:   01/18/21 1928 01/19/21 0545  BP: (!) 126/55 (!) 161/55  Pulse: 60 (!) 55  Resp: 16 16  Temp: 98.8 F (37.1 C) 97.9 F (36.6 C)  SpO2: 96% 96%   Labile: IV mag as below, continue current regimen 9. T2DM: Hgb A1c-. 8.8 and poorly controlled. Continue dose of levermir tonight --Will resume amaryl today and metformin tomorrow (48 hours post dye) -CBG (last 3)  Recent Labs    01/18/21 1625 01/18/21 2112 01/19/21 0619  GLUCAP 112* 122* 138*  CBGs slightly elevated, increase metformin to 1000mg  BID.  -improving control 10. Hypoxia: Educated on pulmonary hygeine. --Question fluid overload--Grade 1 DD w/mild AVR/sclerosis.               --CXR ordered with daily weights for monitoring. 11.  Dyslipidemia: 12.  H/o Depression: Continue Zoloft. 13. Hypothyroid: Managed with supplementation.  14. Pt doesn't speak - from [place]- daughter and grandson speak English-  15. Bradycardia: decreased amlodipine to 5mg  16. Abdominal pain:  improved on exam today 17. Diarrhea: senna/Miralax d/ced.  18. UTI: ucx + for lactobacillus, part of normal flora- has received 3 days of amoxil---dc 19. Hypokalemia: resolved with supplementation, edited diet order to request fresh fruit daily (she likes bananas) 20. Cervical myofascial pain: appreciate massage by [place] ordered. 21. Hypomagnesemia: supplement 2gm IV given 6/30, repeat Monday.   -continue  oral supplement 22. Thrush: resolved with nystatin.  23. Left wrist pain, has history of fracture in 2021: voltaren gel ordered scheduled, wrist brace and XR ordered- no new fractures.   LOS: 14 days A FACE TO FACE EVALUATION WAS PERFORMED  Drema Pry Karver Fadden 01/19/2021, 12:07 PM

## 2021-01-19 NOTE — Progress Notes (Signed)
Speech Language Pathology Discharge Summary  Patient Details  Name: Jo Larson MRN: [id number] Date of Birth: [birth year]  Today's Date: 01/19/2021 SLP Individual Time: 47 minutes   Skilled Therapeutic Interventions: Skilled ST intervention performed with focus on cognitive goals and family education with patient's daughter-in-law. Patient was seen with interpreter. SLP provided education on patient's progress, current deficits, and recommendations for home. From a swallowing standpoint, patient and daughter-in-law were educated on recommendations to continue Dys 2 diet, providing supervision during meals, and implementing swallow strategies including 1. Slow rate, 2. Small bites, 3. Single bites at a time, 4. Assessing for pocketing. From a cognitive perspective, educated on facilitating a low stimulation environment to minimize distractions, providing additional processing time, providing verbal reinforcement for comprehension, and ways to enhance orientation (use of visuals, discussing dow/mo/year and daily tasks each day). All questions were addressed and education complete. Patient to discharge home with 24/7 supervision.  Patient has met 2 of 4 long term goals.  Patient to discharge at [facility] level.  Reasons goals not met: Inconsistent participation   Clinical Impression/Discharge Summary:  Patient has made slow gains and has met 2 of 4 LTG's this admission due to inconsistent participation. Patient required consistent encouragement to increase motivation to participate. Patient is currently an overall min A for cognitive tasks. Level of support fluctuates depending on complexity of tasks, and also appears attributed to overall interest in tasks and motivation to participate. Patient is able to implement safe swallow strategies with direct supervision, and required Mod A verbal cues to recall strategies. Patient to discharge with Dys 2 diet, thin liquids, and meds taken whole in applesauce.  Patient and family education complete and patient will discharge home with 24 hour supervision from family. Patient would beneift from home health f/u services to maximize cognitive functions (orientation, attention, problem solving, following directions, sequencing) and swallow safety in order to maximize her functional independence.   Care Partner:  Caregiver Able to Provide Assistance: Yes  Type of Caregiver Assistance: Physical;Cognitive  Recommendation:  24 hour supervision/assistance;Home Health SLP  Rationale for SLP Follow Up: Maximize cognitive function and independence;Maximize swallowing safety   Equipment: None for SLP   Reasons for discharge: Discharged from hospital;Treatment goals met   Patient/Family Agrees with Progress Made and Goals Achieved: Yes    Viva Gallaher T Henley Blyth 01/19/2021, 4:56 PM

## 2021-01-19 NOTE — Discharge Summary (Signed)
Physical Therapy Discharge Summary  Patient Details  Name: Jo Larson MRN: [id number] Date of Birth: 02-Jul-1932  Today's Date: 01/19/2021 PT Individual Time: 1100-1155 + 1330-1415 PT Individual Time Calculation (min): 55 min  + 45 min   Patient has met 9 of 9 long term goals due to improved activity tolerance, improved balance, improved postural control, increased strength, decreased pain, ability to compensate for deficits, and functional use of  left upper extremity and left lower extremity.  Patient to discharge at an ambulatory level [place].   Patient's care partner is independent to provide the necessary physical and cognitive assistance at discharge.  Reasons goals not met: n/a  Recommendation:  Patient will benefit from ongoing skilled PT services in home health setting to continue to advance safe functional mobility, address ongoing impairments in L hemibody weakness, balance, gait, endurance, and home safety training to reduce caregiver burden and minimize fall risk.  Equipment: Wheelchair for longer distance mobility. Pt unable to use RW safety due to L wrist pain (prior fx from fall in 2021 and motor planning deficits)  Reasons for discharge: treatment goals met and discharge from hospital  Patient/family agrees with progress made and goals achieved: Yes  PT Discharge Precautions/Restrictions Precautions Precautions: Fall Precaution Comments: Non-English speaking ([place]). L hemi Restrictions Weight Bearing Restrictions: No Vital Signs Therapy Vitals Temp: 97.9 F (36.6 C) Pulse Rate: (!) 55 Resp: 16 BP: (!) 161/55 Patient Position (if appropriate): Lying Oxygen Therapy SpO2: 96 % O2 Device: Room Air Pain Pain Assessment Pain Scale: 0-10 Pain Score: 0-No pain Vision/Perception  Perception Perception: Impaired Comments: Mild L inattention (often leaves LUE hanging off of w/c and needs cues for bringing into lap) Praxis Praxis: Intact   Cognition Overall Cognitive Status: Difficult to assess (difficult to assess due to language barrier) Arousal/Alertness: Awake/alert Orientation Level: Oriented X4 Awareness: Impaired Problem Solving: Impaired Problem Solving Impairment: Functional basic;Verbal complex;Functional complex Safety/Judgment: Impaired Sensation Sensation Light Touch: Impaired by gross assessment Hot/Cold: Not tested Proprioception: Impaired by gross assessment Stereognosis: Not tested Additional Comments: Difficult to assess due to language barrier Coordination Gross Motor Movements are Fluid and Coordinated: No Fine Motor Movements are Fluid and Coordinated: No Coordination and Movement Description: slowed and effortful Motor  Motor Motor: Hemiplegia Motor - Discharge Observations: L hemi - improved since date of eval  Mobility Bed Mobility Bed Mobility: Supine to Sit;Sit to Supine;Rolling Right;Rolling Left Rolling Right: Supervision/verbal cueing Rolling Left: Supervision/Verbal cueing Supine to Sit: Supervision/Verbal cueing Sit to Supine: Supervision/Verbal cueing Transfers Transfers: Sit to Stand;Stand to Sit;Stand Pivot Transfers Sit to Stand: Minimal Assistance - Patient > 75% Stand to Sit: Minimal Assistance - Patient > 75% Stand Pivot Transfers: Minimal Assistance - Patient > 75% Stand Pivot Transfer Details: Verbal cues for gait pattern;Verbal cues for safe use of DME/AE;Verbal cues for sequencing;Verbal cues for technique;Verbal cues for precautions/safety;Visual cues/gestures for precautions/safety;Visual cues for safe use of DME/AE;Tactile cues for sequencing;Tactile cues for initiation;Tactile cues for weight shifting Transfer (Assistive device): 1 person hand held assist Locomotion  Gait Ambulation: Yes Gait Assistance: Minimal Assistance - Patient > 75% Gait Distance (Feet): 125 Feet Assistive device: 1 person hand held assist Gait Assistance Details: Verbal cues for  technique;Verbal cues for precautions/safety;Verbal cues for sequencing;Verbal cues for gait pattern;Verbal cues for safe use of DME/AE;Tactile cues for posture;Tactile cues for weight shifting;Tactile cues for initiation;Tactile cues for placement;Tactile cues for weight beaing Gait Gait: Yes Gait Pattern: Impaired Gait Pattern: Step-to pattern;Decreased step length - left;Decreased stance time - left;Decreased  hip/knee flexion - left;Decreased dorsiflexion - left;Decreased weight shift to left;Left flexed knee in stance;Trunk flexed;Poor foot clearance - left Gait velocity: decreased Stairs / Additional Locomotion Stairs: Yes Stair Management Technique: One rail Right Number of Stairs: 4 Height of Stairs: 6inches Wheelchair Mobility Wheelchair Mobility: No  Trunk/Postural Assessment  Cervical Assessment Cervical Assessment: Exceptions to [facility] (forward head) Thoracic Assessment Thoracic Assessment: Exceptions to [facility] (rounded shoulders) Lumbar Assessment Lumbar Assessment: Exceptions to [facility] (posterior pelvic tilt) Postural Control Postural Control: Deficits on evaluation (delayed)  Balance Balance Balance Assessed: Yes Static Sitting Balance Static Sitting - Balance Support: Feet supported Static Sitting - Level of Assistance: 6: Modified independent (Device/Increase time) Dynamic Sitting Balance Dynamic Sitting - Balance Support: Feet supported;During functional activity Dynamic Sitting - Level of Assistance: 5: Stand by assistance Static Standing Balance Static Standing - Balance Support: Right upper extremity supported Static Standing - Level of Assistance: 4: Min assist Dynamic Standing Balance Dynamic Standing - Balance Support: During functional activity;Right upper extremity supported Dynamic Standing - Level of Assistance: 4: Min assist Extremity Assessment   RLE Assessment RLE Assessment: Exceptions to [facility] General Strength Comments: Grossly 4/5 LLE Assessment LLE  Assessment: Exceptions to [facility] General Strength Comments: Grossly 4-/5  Skilled Intervention:  1st session: Pt greeted sitting in w/c to start session. Interpreter and daughter-in-law at bedside. Focus of session to review scheduled family education and training; however, at start, daughter-in-law reports she needs to leave early for unspecified reasons. Reviewed with her pt's current mobility status, home safety training, role of f/u therapies, and DME rec's. Also requested she stay to review stair training as pt has 2 STE home with no rails - daughter in law agreeable. Wheeled to main rehab gym for time management. Reviewed entrance into home with daughter-in-law who reports steps are around 6 inches high. Pt was able to navigate up/down x4 steps with minA via HHA without rails and needed cues for stepping sequencing (Up with R foot, down with L foot). Daughter-in-law present for active observation and reports she/family assisted with this prior to hospitalization and feel confident and comfortable with this at home. Verbally reviewed car transfer technique with her and again, daughter-in-law reporting confidence and understanding. Daughter-in-law left session at this point and interpreter present for remaining session. Pt ambulated 3x38f (seated rest breaks needed) with minA and HHA (to R hand) with cues for increasing L step length and L step height - gait quality much improved from prior sessions with reduced unsteadiness. Completed seated exercises for improving activity tolerance and endurance - 4# ankle weight LAQ 2x10 reps bilaterally. She was returned to room at end of session and remained seated with safety belt alarm on and all needs in reach.   2nd session: Pt received sleeping in bed soundly at start of session - awakens to voice. Pt agreeable to PT session. No interpreter at bedside - used gestures throughout session which pt appeared to understand well. Pt connected to PIV (Mag), RN unable  to disconnect until complete (completed later during session and RN disconnected PIV). Pt without c/o pain throughout session. Supine<>sit completed with supervision. Donned tennis shoes with totalA for time management. Completed stand<>pivot transfer with minA to w/c. Wheeled to day room gym for time management. Stand<>pivot transfer with minA to [place]. Completed 10 minutes at workload 2 using BLE and RUE with encouragement needed to complete. Returned to w/c via stand pivot with minA and then wheeled to ortho gym. Completed ambulatory car transfer with minA via [place]  [place] height set to simulate smaller sedan. Pt with good understanding of approach and general safety awareness. Next, she ambulated up/down 57f ramp with minA and HHA with cues needed for increasing L step length and height. She completed seated there-ex with 2# dowel rod including bicep curls, shldr press, and chest press (she needed hand-over-hand assist for LUE to maintain grip to bar). Returned to w/c via stand pivot and back to room where she was assisted to bed with minA and completed bed mobility with supervision. All needs met at end of session and pt made comfortable.   Jo Larson P Jo Larson PT, DPT 01/19/2021, 7:59 AM

## 2021-01-19 NOTE — Progress Notes (Signed)
Occupational Therapy Discharge Summary  Patient Details  Name: Jo Larson MRN: [id number] Date of Birth: February 13, 1932  Today's Date: 01/19/2021 OT Individual Time: 0905-1005 OT Individual Time Calculation (min): 60 min    Patient has met 7 of 9 long term goals due to improved activity tolerance, improved balance, ability to compensate for deficits, functional use of  LEFT upper and LEFT lower extremity, improved attention, improved awareness, and improved coordination.  Patient to discharge at [facility] Assist level.  Patient's care partner is independent to provide the necessary physical and cognitive assistance at discharge.    Reasons goals not met: Pt continues to exhibit apathy towards participation during ADLs and needs significant amount of encouragement.  Also pts remaining cognitive impairments limited carryover of skilled training.    Recommendation:  Patient will benefit from ongoing skilled OT services in home health setting to continue to advance functional skills in the area of BADL.  Pt would benefit from further neuro re-ed of LUE for improved attention to and functional use during self care and mobility.  Pt also would benefit from training in sequencing, problem solving, and safety awareness during basic self care tasks.  Equipment: Shower bench, bedside commode, w/c; requested left half lap tray  Reasons for discharge: treatment goals met and discharge from hospital  Patient/family agrees with progress made and goals achieved: Yes  Skilled Intervention:  Pt sitting up in w/c with dtr-in-law and interpreter present.  No c/o pain throughout session.  Pt required mod intermittent question Vcs to facilitate decision making, initiating, sequencing, and problem solving throughout sinkside bathing and dressing.  Pt able to complete UB/LB bathing and dressing at sink per below levels of assist. Educated pts dtr-in-law on cueing strategies to facilitate increased pt participation  and performance during ADLs.  Dtr-in-law return demonstrated independently.  Pt sitting up in w/c, call bell in reach, seat alarm on at end of session.  OT Discharge Precautions/Restrictions  Precautions Precautions: Fall Precaution Comments: Non-English speaking ([place]). L hemi Restrictions Weight Bearing Restrictions: No Pain Pain Assessment Pain Scale: 0-10 Pain Score: 0-No pain ADL ADL Eating: Set up Where Assessed-Eating: Wheelchair Grooming: Setup Where Assessed-Grooming: Sitting at sink Upper Body Bathing: Supervision/safety Where Assessed-Upper Body Bathing: Shower Lower Body Bathing: Minimal assistance Where Assessed-Lower Body Bathing: Shower Upper Body Dressing: Supervision/safety Where Assessed-Upper Body Dressing: Wheelchair Lower Body Dressing: Moderate assistance Where Assessed-Lower Body Dressing: Sitting at sink, Standing at sink Toileting: Minimal assistance Where Assessed-Toileting: Glass blower/designer: Therapist, music Method: Counselling psychologist: Ambulance person Transfer: Metallurgist Method: Optometrist: Facilities manager: Moderate assistance Social research officer, government Method: Radiographer, therapeutic: Radio broadcast assistant, Grab bars Vision Baseline Vision/History: No visual deficits Patient Visual Report: No change from baseline Vision Assessment?: No apparent visual deficits Perception  Perception: Impaired Inattention/Neglect: Other (comment) Comments: mild L inattention (often leaves LUE hanging off of w/c and needs cues for bringing into lap) Praxis Praxis: Impaired Praxis Impairment Details: Initiation;Ideation;Perseveration Cognition Overall Cognitive Status: Impaired/Different from baseline Arousal/Alertness: Awake/alert Orientation Level: Oriented X4 Attention: Focused;Sustained;Selective;Alternating Focused Attention:  Impaired Focused Attention Impairment: Functional basic Sustained Attention: Impaired Sustained Attention Impairment: Functional basic Selective Attention: Impaired Selective Attention Impairment: Functional basic Alternating Attention: Impaired Alternating Attention Impairment: Functional basic Memory: Impaired Memory Impairment: Storage deficit;Retrieval deficit;Decreased recall of new information;Decreased short term memory Decreased Short Term Memory: Functional basic Awareness: Impaired Awareness Impairment: Intellectual impairment;Emergent impairment Problem Solving: Impaired Problem Solving Impairment: Verbal  basic Executive Function: Sequencing;Organizing;Decision Making;Initiating;Self Monitoring;Self Correcting Sequencing: Impaired Sequencing Impairment: Functional basic Organizing: Impaired Organizing Impairment: Functional basic Decision Making: Impaired Decision Making Impairment: Functional basic Initiating: Impaired Initiating Impairment: Functional basic Self Monitoring: Impaired Self Monitoring Impairment: Functional basic Self Correcting: Impaired Self Correcting Impairment: Functional basic Behaviors: Impulsive Safety/Judgment: Impaired Sensation Sensation Light Touch: Impaired by gross assessment Hot/Cold: Appears Intact Proprioception: Impaired Detail Proprioception Impaired Details: Impaired LUE;Impaired LLE Stereognosis: Not tested Coordination Gross Motor Movements are Fluid and Coordinated: No Fine Motor Movements are Fluid and Coordinated: No Coordination and Movement Description: slowed and effortful Finger Nose Finger Test: impaired LUE; WNL RUE Motor  Motor Motor: Hemiplegia Motor - Discharge Observations: L hemi - improved since date of eval Mobility  Bed Mobility Bed Mobility: Supine to Sit;Sit to Supine;Rolling Right;Rolling Left Rolling Right: Supervision/verbal cueing Rolling Left: Supervision/Verbal cueing Supine to Sit:  Supervision/Verbal cueing Sit to Supine: Supervision/Verbal cueing Transfers Sit to Stand: Minimal Assistance - Patient > 75% Stand to Sit: Minimal Assistance - Patient > 75%  Trunk/Postural Assessment  Cervical Assessment Cervical Assessment: Exceptions to [facility] (forward head posture) Thoracic Assessment Thoracic Assessment: Exceptions to [facility] (rounded shoulders) Lumbar Assessment Lumbar Assessment: Exceptions to [facility] (posterior pelvic tilt) Postural Control Postural Control: Deficits on evaluation  Balance Balance Balance Assessed: Yes Static Sitting Balance Static Sitting - Balance Support: Feet supported Static Sitting - Level of Assistance: 6: Modified independent (Device/Increase time) Dynamic Sitting Balance Dynamic Sitting - Balance Support: Feet supported;During functional activity Dynamic Sitting - Level of Assistance: 5: Stand by assistance Static Standing Balance Static Standing - Balance Support: Right upper extremity supported Static Standing - Level of Assistance: 4: Min assist Dynamic Standing Balance Dynamic Standing - Balance Support: During functional activity;Right upper extremity supported Dynamic Standing - Level of Assistance: 4: Min assist Extremity/Trunk Assessment RUE Assessment RUE Assessment: Within Functional Limits LUE Assessment LUE Assessment: Exceptions to [facility] General Strength Comments: 4-/5; inattention and impaired motor control; difficult to fully assess MMT   Caryl Asp Epsie Walthall 01/19/2021, 2:56 PM

## 2021-01-20 DIAGNOSIS — M25532 Pain in left wrist: Secondary | ICD-10-CM

## 2021-01-20 DIAGNOSIS — D72829 Elevated white blood cell count, unspecified: Secondary | ICD-10-CM

## 2021-01-20 LAB — GLUCOSE, CAPILLARY: Glucose-Capillary: 126 mg/dL — ABNORMAL HIGH (ref 70–99)

## 2021-01-20 LAB — MAGNESIUM: Magnesium: 2 mg/dL (ref 1.7–2.4)

## 2021-01-20 MED ORDER — LEVOTHYROXINE SODIUM 88 MCG PO TABS: 88.0000 ug | ORAL_TABLET | Freq: Every day | ORAL | 0 refills | Status: AC

## 2021-01-20 MED ORDER — CLONIDINE HCL 0.1 MG PO TABS: 0.1000 mg | ORAL_TABLET | Freq: Two times a day (BID) | ORAL | 0 refills | Status: AC

## 2021-01-20 MED ORDER — GLIMEPIRIDE 2 MG PO TABS: 2.0000 mg | ORAL_TABLET | Freq: Every day | ORAL | 0 refills | Status: AC

## 2021-01-20 MED ORDER — ACETAMINOPHEN 325 MG PO TABS: 650.0000 mg | ORAL_TABLET | Freq: Three times a day (TID) | ORAL | Status: AC

## 2021-01-20 MED ORDER — ASPIRIN 325 MG PO TBEC: 325.0000 mg | DELAYED_RELEASE_TABLET | Freq: Every day | ORAL | 12 refills | Status: AC

## 2021-01-20 MED ORDER — CLOPIDOGREL BISULFATE 75 MG PO TABS: 75.0000 mg | ORAL_TABLET | Freq: Every day | ORAL | 0 refills | Status: AC

## 2021-01-20 MED ORDER — LOSARTAN POTASSIUM 50 MG PO TABS: 50.0000 mg | ORAL_TABLET | Freq: Every day | ORAL | 0 refills | Status: DC

## 2021-01-20 MED ORDER — PANTOPRAZOLE SODIUM 40 MG PO TBEC: 40.0000 mg | DELAYED_RELEASE_TABLET | Freq: Every day | ORAL | 0 refills | Status: AC

## 2021-01-20 MED ORDER — ATORVASTATIN CALCIUM 20 MG PO TABS: 20.0000 mg | ORAL_TABLET | Freq: Every day | ORAL | 0 refills | Status: AC

## 2021-01-20 MED ORDER — DICLOFENAC SODIUM 1 % EX GEL: 2.0000 g | Freq: Four times a day (QID) | CUTANEOUS | 0 refills | Status: AC

## 2021-01-20 MED ORDER — AMLODIPINE BESYLATE 5 MG PO TABS: 5.0000 mg | ORAL_TABLET | Freq: Every day | ORAL | 0 refills | Status: DC

## 2021-01-20 MED ORDER — MAGNESIUM OXIDE -MG SUPPLEMENT 400 (240 MG) MG PO TABS: 400.0000 mg | ORAL_TABLET | Freq: Two times a day (BID) | ORAL | 0 refills | Status: AC

## 2021-01-20 MED ORDER — SERTRALINE HCL 100 MG PO TABS: 100.0000 mg | ORAL_TABLET | Freq: Every day | ORAL | 0 refills | Status: AC

## 2021-01-20 MED ORDER — METFORMIN HCL 1000 MG PO TABS: 1000.0000 mg | ORAL_TABLET | Freq: Two times a day (BID) | ORAL | 0 refills | Status: AC

## 2021-01-20 NOTE — Discharge Summary (Signed)
Physician Discharge Summary  Patient ID: Jo Larson MRN: [id number] DOB/AGE: 29-May-1932 87 y.o.  Admit date: 01/05/2021 Discharge date: 01/20/2021  Discharge Diagnoses:  Principal Problem:   Stroke (cerebrum) [facility]) Active Problems:   Type 2 diabetes mellitus without complication, without long-term current use of insulin (HCC)   Hyperlipidemia   ANXIETY DISORDER, GENERALIZED   Essential hypertension   Leucocytosis   Hypomagnesemia   Left wrist pain   Discharged Condition: good  Significant Diagnostic Studies: DG Wrist Complete Left  Result Date: 01/12/2021 CLINICAL DATA:  Wrist pain EXAM: LEFT WRIST - COMPLETE 3+ VIEW COMPARISON:  05/23/2020 FINDINGS: Old fracture deformity of the distal radius. Old fracture deformity of the ulnar styloid. No definite acute displaced fracture is seen. Degenerative changes at the first [facility] joint. IMPRESSION: Old fracture deformities of the distal radius and ulna. No definite acute osseous abnormality Electronically Signed   By: Jasmine Pang M.D.   On: 01/12/2021 17:36   DG Abd 1 View  Result Date: 01/06/2021 CLINICAL DATA:  Abdominal pain. EXAM: ABDOMEN - 1 VIEW COMPARISON:  04/03/2013 FINDINGS: Gas is present in scattered loops of nondilated small and large bowel. No dilated loops of bowel are seen to suggest obstruction. Evaluation for intraperitoneal free air is limited on this supine study. An oblong dystrophic calcification projecting over the right lower quadrant is unchanged. Spondylosis and mild levoscoliosis are noted in the lumbar spine. IMPRESSION: Nonobstructed bowel gas pattern. Electronically Signed   By: Sebastian Ache M.D.   On: 01/06/2021 13:02    Labs:  Basic Metabolic Panel: Recent Labs  Lab 01/15/21 0549 01/16/21 0506 01/18/21 0516 01/19/21 0448 01/20/21 0452  NA  --  137  --   --   --   K  --  4.4  --   --   --   CL  --  103  --   --   --   CO2  --  25  --   --   --   GLUCOSE  --  98  --   --   --   BUN  --  25*  --    --   --   CREATININE  --  0.80  --   --   --   CALCIUM  --  9.5  --   --   --   MG 1.9 1.8 1.7 1.6* 2.0    CBC: CBC Latest Ref Rng & Units 01/16/2021 01/14/2021 01/13/2021  WBC 4.0 - 10.5 K/uL 11.7(H) 15.6(H) 12.0(H)  Hemoglobin 12.0 - 15.0 g/dL 3.7(C) 5.8(I) 5.0(Y)  Hematocrit 36.0 - 46.0 % 30.3(L) 30.7(L) 29.8(L)  Platelets 150 - 400 K/uL 508(H) 574(H) 477(H)     CBG: Recent Labs  Lab 01/19/21 0619 01/19/21 1156 01/19/21 1622 01/19/21 2056 01/20/21 0606  GLUCAP 138* 163* 148* 130* 126*    Brief HPI:   Jo Larson is a 87 y.o. female with history of CAD, T2DM, HTN, depression/anxiety who was admitted on 12/31/2018 with progressive LUE weakness a few days duration.  MRI/MRI brain done revealing patchy acute infarcts in right frontoparietal lobes and minimal right occipital lobe involvement.  CTA head/neck done for work-up on 06/13 and showed 80% stenosis of right carotid bulb.  She was evaluated by Dr. Philemon Kingdom and underwent diagnostic cerebral angiogram with right carotid stenting on 06/13.  Postprocedure to continue on DAPT x3 months with recommendations for carotid ultrasound for follow-up on outpatient basis.  She continues to be limited by left-sided weakness  with sensory deficits, visual deficits, cognitive deficits with delay in processing and possible motor planning deficits.  CIR was recommended due to functional decline.   Hospital Course: Jo Larson was admitted to rehab 01/05/2021 for inpatient therapies to consist of PT, ST and OT at least three hours five days a week. Past admission physiatrist, therapy team and rehab RN have worked together to provide customized collaborative inpatient rehab.  She was maintained on DAPT during her stay and is tolerating this without side effects.  Serial CBC showed leukocytosis which is slowly improving and no signs of infection noted.  H&H is stable and recommend follow-up CBC on outpatient basis.  Urine culture done and she  was started on amoxicillin empirically however culture showed lactobacillus therefore antibiotics DC'd.  She has had issues with fatigue as well as pain in left wrist.  X-rays of left wrist showed evidence of old fracture without acute abnormality.  Voltaren gel was added to help manage symptoms. She was found to have low magnesium level which was supplemented orally as well as parenterally.    Tylenol was scheduled qid to help manage headaches as well as wrist pain.  OT has also worked on cervical myofascial release to help with HA.    She has also required encouragement for consistent p.o. intake.  Her blood pressures were monitored on TID basis and Norvasc was decreased to 5 mg due to bradycardia.  Cozaar was decreased to 50 mg avoid hypotension and currently BP is reasonably controlled. Her diabetes has been monitored with ac/hs CBG checks and SSI was use prn for tighter BS control.  Amaryl and metformin were resumed post admission and Metformin was titrated up to 1000 mg BID with improvement in BS. Her progress has been slow and limited in part due to decreased participation as well as apathy. She has been unable to use walker safety due to wrist pain and motor planning deficits. Family was advised to continue HEP and she will continue to receive follow up HHPT and HHOT by [facility] after discharge.    Rehab course: During patient's stay in rehab weekly team conferences were held to monitor patient's progress, set goals and discuss barriers to discharge. At admission, patient required mod assist with mobility and ADL tasks. She exhibited mild to moderate cognitive impairments, had poor recall and limited attention.  She  has had improvement in activity tolerance, balance, postural control as well as ability to compensate for deficits. She has had improvement in use of LUE and LLE as well as ability to compensate for deficits. She is able to complete ADL tasks with min assist. She requires min  assist with cues for transfers and to ambulate 125' with HHA. She requires min verbal cues with increased time for attention and requires mod to max assist for cognitive tasks.  Family education was completed regarding all aspects of safety and care.   Discharge disposition: 01-Home or Self Care  Diet: Carb Modified. Dysphagia 2,   Special Instructions: Monitor BS bid-tid. Will need CBC repeated in 1-2 weeks to monitor H/H and WBC.  Repeat Mg level in a few weeks to monitor for stability.   Discharge Instructions     Ambulatory referral to Neurology   Complete by: As directed    Follow up with stroke clinic NP (Jessica Vanschaick or Darrol Angel, if both not available, consider Manson Allan, or Ahern) at [facility] in about 4 weeks. Thanks.   Ambulatory referral to Physical Medicine Rehab  Complete by: As directed    3-4 week follow up      Allergies as of 01/20/2021       Reactions   Clindamycin/lincomycin Swelling   Tongue swelling, angioedema   Peanut Butter Flavor Shortness Of Breath   Swelling and airway closes   Peanuts [peanut Oil] Shortness Of Breath   Swelling and airway closes        Medication List     STOP taking these medications    Cholecalciferol 25 MCG (1000 UT) capsule   esomeprazole 40 MG capsule Commonly known as: NEXIUM   famotidine 20 MG tablet Commonly known as: PEPCID   LORazepam 1 MG tablet Commonly known as: Ativan   metFORMIN 500 MG 24 hr tablet Commonly known as: GLUCOPHAGE-XR Replaced by: metFORMIN 1000 MG tablet   metoprolol tartrate 25 MG tablet Commonly known as: LOPRESSOR       TAKE these medications    acetaminophen 325 MG tablet Commonly known as: TYLENOL Take 2 tablets (650 mg total) by mouth 4 (four) times daily -  before meals and at bedtime.   amLODipine 5 MG tablet Commonly known as: NORVASC Take 1 tablet (5 mg total) by mouth daily.   aspirin 325 MG EC tablet Take 1 tablet (325 mg total) by mouth daily.    atorvastatin 20 MG tablet Commonly known as: LIPITOR Take 1 tablet (20 mg total) by mouth daily.   cloNIDine 0.1 MG tablet Commonly known as: CATAPRES Take 1 tablet (0.1 mg total) by mouth 2 (two) times daily.   clopidogrel 75 MG tablet Commonly known as: PLAVIX Take 1 tablet (75 mg total) by mouth daily.   diclofenac Sodium 1 % Gel Commonly known as: VOLTAREN Apply 2 g topically 4 (four) times daily. To left wrist   glimepiride 2 MG tablet Commonly known as: AMARYL Take 1 tablet (2 mg total) by mouth daily.   levothyroxine 88 MCG tablet Commonly known as: SYNTHROID Take 1 tablet (88 mcg total) by mouth daily.   losartan 50 MG tablet Commonly known as: COZAAR Take 1 tablet (50 mg total) by mouth daily with supper. What changed:  medication strength how much to take when to take this   magnesium oxide 400 (240 Mg) MG tablet Commonly known as: MAG-OX Take 1 tablet (400 mg total) by mouth 2 (two) times daily.   metFORMIN 1000 MG tablet Commonly known as: GLUCOPHAGE Take 1 tablet (1,000 mg total) by mouth 2 (two) times daily with a meal. Replaces: metFORMIN 500 MG 24 hr tablet   pantoprazole 40 MG tablet Commonly known as: PROTONIX Take 1 tablet (40 mg total) by mouth daily. Notes to patient: Takes place of Nexium--to avoid reducing effectiveness of plavix. Can use this as needed if reflux is not very bad.   sertraline 100 MG tablet Commonly known as: ZOLOFT Take 1 tablet (100 mg total) by mouth daily.        Follow-up Information     Raulkar, Drema PryKrutika P, MD Follow up.   Specialty: Physical Medicine and Rehabilitation Why: office will call you with follow up appointment Contact information: [address] [place] [address] [phone]         Macy MisBriscoe, Kim K, MD. Call on 01/24/2021.   Specialty: Family Medicine Why: for post hospital follow up Contact information: [address] [place]  [id number] [phone]         [facility] Follow up.   Why: office will  call you with follow up appointment Contact information: [address]     [address] [place] [address] [phone]        de Glori Luis, MD Follow up.   Specialties: Radiology, Interventional Radiology Why: Will need follow up carotid ultrasound in 2 months with follow up appointment to decide on blood thinner Contact information: [address] [place] [address] [phone]                 Signed: Jacquelynn Cree 01/20/2021, 4:28 PM

## 2021-01-20 NOTE — Progress Notes (Signed)
INPATIENT REHABILITATION DISCHARGE NOTE   Discharge instructions by: Elita Quick, PA  Verbalized understanding: yes   Skin care/Wound care: none  Pain: none  IV's: removed  Tubes/Drains: none  Safety instructions: done  Patient belongings: sent with pt  Discharged SF:KCLE  Discharged via: family transport  Notes: done    Marylu Lund, Charity fundraiser

## 2021-01-24 ENCOUNTER — Telehealth: Payer: Self-pay

## 2021-01-24 ENCOUNTER — Telehealth: Payer: Self-pay | Admitting: *Deleted

## 2021-01-24 NOTE — Telephone Encounter (Signed)
Hospital discharge note reviewed per protocol.   Verbal okay given to [facility] Physical Therapist with [facility]. To provide PT once a week for 2 weeks. To work on  balance, safety, independence, safety & mobility.   Call back phone [phone].

## 2021-01-24 NOTE — Telephone Encounter (Signed)
Jo Larson called for OT POC  1wk8.  Approval given.

## 2021-01-27 ENCOUNTER — Encounter: Payer: Medicare Other | Admitting: Physical Medicine and Rehabilitation

## 2021-01-31 ENCOUNTER — Other Ambulatory Visit: Payer: Self-pay

## 2021-01-31 ENCOUNTER — Encounter: Payer: Self-pay | Admitting: Registered Nurse

## 2021-01-31 ENCOUNTER — Encounter: Payer: Medicare Other | Attending: Physical Medicine and Rehabilitation | Admitting: Registered Nurse

## 2021-01-31 VITALS — BP 116/60 | HR 69 | Temp 98.7°F | Ht 60.0 in

## 2021-01-31 DIAGNOSIS — I10 Essential (primary) hypertension: Secondary | ICD-10-CM

## 2021-01-31 DIAGNOSIS — E785 Hyperlipidemia, unspecified: Secondary | ICD-10-CM | POA: Diagnosis present

## 2021-01-31 DIAGNOSIS — E119 Type 2 diabetes mellitus without complications: Secondary | ICD-10-CM | POA: Diagnosis present

## 2021-01-31 NOTE — Progress Notes (Signed)
Subjective:    Patient ID: Jo Larson, female    DOB: 01/03/1932, 87 y.o.   MRN: [id number]  HPI: Jo Larson is a 87 y.o. female who is here for Hospital Follow up appointment of her Stroke ( Cerebrum), Essential Hypertension, Dyslipidemia and Type 2 DM without complication,without long-term current use of insulin.  H&P: Dr. Nino Parsley Ballinas is a 87 y.o. female with medical history significant of HTN, DM2, hypothyroidism. Presenting with 1 day of left upper extremity weakness. History w/ interpreter as patient is [place] speaking only. The patient and family report that the patient had a noticeably weak left hand grip starting yesterday afternoon. She was having difficulty picking up her mug to drink water. She didn't think much of it, but the daughter tried to convince her to come to the ED. She did not. Her weakness worsened early this morning to the point that she really couldn't life her arm and it felt numb. She felt a lack of balance like she was listing to the left side. Her daughter checked in again on her and found her extremely weak on the left. She convinced her to come be evaluated in the ED. She denies any other aggravating or alleviating factors.     Neurology was consulted.  CT Head WO Contrast:  IMPRESSION: New acute or subacute infarct at the high RIGHT posterior frontal lobe. No signs of hemorrhage, mass effect or midline shift.   Stable findings of atrophy and chronic microvascular ischemic change.  MRI Brain WO Contrast:  IMPRESSION: Patchy acute infarcts in the right frontoparietal lobes including the perirolandic region. Minimal right occipital involvement as well (right posterior communicating artery is present).   Chronic microvascular ischemic changes.   No proximal intracranial vessel occlusion. No hemodynamically significant stenosis in the neck.  On 01/03/2021: she underwent : See Below      de Melchor Amour, Jerilynn Mages, MD Primary      Procedure Laterality Anesthesia  IR WITH ANESTHESIA - CAROTID STENT      Jo Larson was continued on DAPT x 3 months.   Jo Larson was admitted to inpatient rehabilitation and discharged home on 01/20/2021. She is receiving outpatient therapy with [facility]. She denies any pain at this time, reports she has chronic headaches occasionally. She rated her pain 5 on The Health History.  Daughter In law asked about receiving personal care aide, e-mail sent to [place] [facility], she will forward the e-mail to [place] [facility], Ms. Johnson daughter in law is aware of the above.   This provider placed a call to [facility] to schedule HFU appointment, left message on the voicemail. Jo Larson was instructed to call office on Thursday if she doesn't receive a call, she verbalizes understanding.   Interpretor in the room all questions was asked and answered via interpretor.     Pain Inventory Average Pain  Maybe 5  Pain Right Now  Maybe 5 My pain is come and goes and spasms LOCATION OF PAIN  Left side of neck ,left wrist pain, and left shoulder  BOWEL Number of stools per week: 7 Oral laxative use No  Type of laxative none Enema or suppository use No  History of colostomy No  Incontinent No   BLADDER Pads In and out cath, frequency N/A Able to self cath No  Bladder incontinence Yes  Frequent urination Yes  Leakage with coughing No  Difficulty starting stream No  Incomplete bladder emptying No    Mobility walk with  assistance how many minutes can you walk? Not able to walk ability to climb steps?  no do you drive?  no use a wheelchair needs help with transfers Do you have any goals in this area?  yes  Function I need assistance with the following:  feeding, dressing, bathing, toileting, meal prep, household duties, and shopping Do you have any goals in this area?  no  Neuro/Psych weakness numbness tingling trouble walking spasms depression  Prior Studies New  Patient  Physicians involved in your care New Patient   Family History  Problem Relation Age of Onset   Hypertension Father    Diabetes Father    Social History   Socioeconomic History   Marital status: Married    Spouse name: Not on file   Number of children: 3   Years of education: Not on file   Highest education level: Not on file  Occupational History   Not on file  Tobacco Use   Smoking status: Never   Smokeless tobacco: Never  Vaping Use   Vaping Use: Never used  Substance and Sexual Activity   Alcohol use: No   Drug use: No   Sexual activity: Never  Other Topics Concern   Not on file  Social History Narrative   Lives at home with daughter in law.    Social Determinants of Health   Financial Resource Strain: Not on file  Food Insecurity: Not on file  Transportation Needs: Not on file  Physical Activity: Not on file  Stress: Not on file  Social Connections: Not on file   Past Surgical History:  Procedure Laterality Date   INTUBATION NASOTRACHEAL N/A 04/03/2013   Procedure: FIBEROPTIC NASAL-TRACHEAL INTUBATION;  Surgeon: Christia Reading, MD;  Location: [facility] ORS;  Service: ENT;  Laterality: N/A;   IR ANGIO INTRA EXTRACRAN SEL COM CAROTID INNOMINATE BILAT MOD SED  01/03/2021   IR ANGIO VERTEBRAL SEL VERTEBRAL UNI R MOD SED  01/03/2021   IR INTRAVSC STENT CERV CAROTID W/EMB-PROT MOD SED INCL ANGIO  01/03/2021   IR US GUIDE VASC ACCESS RIGHT  01/03/2021   LEFT HEART CATHETERIZATION WITH CORONARY ANGIOGRAM N/A 07/26/2014   Procedure: LEFT HEART CATHETERIZATION WITH CORONARY ANGIOGRAM;  Surgeon: Runell Gess, MD;  Location: [facility] CATH LAB;  Service: Cardiovascular;  Laterality: N/A;   RADIOLOGY WITH ANESTHESIA Right 01/03/2021   Procedure: IR WITH ANESTHESIA - CAROTID STENT;  Surgeon: Baldemar Lenis, MD;  Location: [facility] OR;  Service: Radiology;  Laterality: Right;   Past Medical History:  Diagnosis Date   Abnormal chest x-ray    Angioedema    a. 05/2014 -  felt to be 2/2 clindamycin therapy.   Anxiety    CAD (coronary artery disease)    a. 07/2014 NSTEMI/PCI: LM nl, LAD min irregs, LCX 75m (4.0x18 Xience DES), LPDA 90 (60mm vessel->med Rx), RCA nl, EF 60%.   Diabetes mellitus without complication (HCC)    Hyperlipemia    Hypertension    Hypothyroidism    BP 116/60   Pulse 69   Temp 98.7 F (37.1 C)   Ht 5' (1.524 m)   SpO2 93%   BMI 30.31 kg/m   Opioid Risk Score:   Fall Risk Score:  `1  Depression screen PHQ 2/9  Depression screen PHQ 2/9 01/31/2021  Decreased Interest 1  Down, Depressed, Hopeless 2  PHQ - 2 Score 3  Altered sleeping 3  Tired, decreased energy 3  Change in appetite 3  Feeling bad or failure about  yourself  3  Trouble concentrating 3  Moving slowly or fidgety/restless 3  Suicidal thoughts 0  PHQ-9 Score 21    Review of Systems  Musculoskeletal:  Positive for neck pain.       Pain in left shoulder, left wrist, left neck  All other systems reviewed and are negative.     Objective:   Physical Exam Vitals and nursing note reviewed.  Constitutional:      Appearance: Normal appearance.  HENT:     Head: Normocephalic and atraumatic.  Cardiovascular:     Rate and Rhythm: Normal rate and regular rhythm.     Pulses: Normal pulses.     Heart sounds: Normal heart sounds.  Pulmonary:     Effort: Pulmonary effort is normal.     Breath sounds: Normal breath sounds.  Musculoskeletal:     Cervical back: Normal range of motion and neck supple.     Comments: Normal Muscle Bulk and Muscle Testing Reveals:  Upper Extremities: Right: Full ROM and Muscle Strength 5/5 Left Upper Extremity: Decreased ROM 45 Degrees and Muscle Strength 3/5  Lower Extremities: Full ROM and Muscle Strength 5/5 Arrive in wheelchair     Skin:    General: Skin is warm and dry.  Neurological:     Mental Status: She is alert and oriented to person, place, and time.  Psychiatric:        Mood and Affect: Mood normal.        Behavior:  Behavior normal.         Assessment & Plan:  1.Stroke ( Cerebrum): [facility] was called: She has a scheduled HFU appointment. Continue outpatient therapy with [facility].  2. Essential Hypertension: Continue current medication regimen. PCP following.  3, Dyslipidemia: Continue current medication regimen. PCP Following.  4. Type 2 DM without complication,without long-term current use of insulin: Continue current medication regimen . PCP Following.   F/U with Dr Carlis Abbott in 4-6 weeks

## 2021-02-06 ENCOUNTER — Telehealth: Payer: Self-pay | Admitting: *Deleted

## 2021-02-06 NOTE — Telephone Encounter (Signed)
Lamar Laundry, interpreter for Ms Kunkler, called x2 about wanting to speak with Dossie Der MSW  about getting Ms Hafford into a nursing home.  An email will be sent to [facility].

## 2021-02-06 NOTE — Telephone Encounter (Signed)
Tresa Endo PT called to get extension for 1wk1 2wk5 1wk1 and to request MSW to help with assisting to find placement inSNF.  Approval given.

## 2021-02-06 NOTE — Telephone Encounter (Signed)
Per Kriste Basque, She will need to go through her PCP and ask for a Home health social worker to try to get her insurance to cover placement". I have notified interpreter [place].

## 2021-02-08 ENCOUNTER — Emergency Department (HOSPITAL_COMMUNITY)
Admission: EM | Admit: 2021-02-08 | Discharge: 2021-02-11 | Disposition: A | Discharge status: Home / Self Care | Payer: Medicare Other | Attending: Emergency Medicine | Admitting: Emergency Medicine

## 2021-02-08 ENCOUNTER — Emergency Department (HOSPITAL_COMMUNITY): Payer: Medicare Other

## 2021-02-08 ENCOUNTER — Other Ambulatory Visit: Payer: Self-pay

## 2021-02-08 DIAGNOSIS — I69354 Hemiplegia and hemiparesis following cerebral infarction affecting left non-dominant side: Secondary | ICD-10-CM | POA: Insufficient documentation

## 2021-02-08 DIAGNOSIS — E119 Type 2 diabetes mellitus without complications: Secondary | ICD-10-CM | POA: Insufficient documentation

## 2021-02-08 DIAGNOSIS — E039 Hypothyroidism, unspecified: Secondary | ICD-10-CM | POA: Diagnosis not present

## 2021-02-08 DIAGNOSIS — Z79899 Other long term (current) drug therapy: Secondary | ICD-10-CM | POA: Insufficient documentation

## 2021-02-08 DIAGNOSIS — Z7982 Long term (current) use of aspirin: Secondary | ICD-10-CM | POA: Insufficient documentation

## 2021-02-08 DIAGNOSIS — I2510 Atherosclerotic heart disease of native coronary artery without angina pectoris: Secondary | ICD-10-CM | POA: Insufficient documentation

## 2021-02-08 DIAGNOSIS — Z7984 Long term (current) use of oral hypoglycemic drugs: Secondary | ICD-10-CM | POA: Insufficient documentation

## 2021-02-08 DIAGNOSIS — Z91010 Allergy to peanuts: Secondary | ICD-10-CM | POA: Diagnosis not present

## 2021-02-08 DIAGNOSIS — R4182 Altered mental status, unspecified: Secondary | ICD-10-CM | POA: Diagnosis not present

## 2021-02-08 DIAGNOSIS — R2681 Unsteadiness on feet: Secondary | ICD-10-CM | POA: Insufficient documentation

## 2021-02-08 DIAGNOSIS — Z7902 Long term (current) use of antithrombotics/antiplatelets: Secondary | ICD-10-CM | POA: Diagnosis not present

## 2021-02-08 DIAGNOSIS — Z20822 Contact with and (suspected) exposure to covid-19: Secondary | ICD-10-CM | POA: Insufficient documentation

## 2021-02-08 DIAGNOSIS — I10 Essential (primary) hypertension: Secondary | ICD-10-CM | POA: Diagnosis not present

## 2021-02-08 LAB — CBC WITH DIFFERENTIAL/PLATELET
Abs Immature Granulocytes: 0.05 10*3/uL (ref 0.00–0.07)
Basophils Absolute: 0 10*3/uL (ref 0.0–0.1)
Basophils Relative: 1 %
Eosinophils Absolute: 0.3 10*3/uL (ref 0.0–0.5)
Eosinophils Relative: 3 %
HCT: 32 % — ABNORMAL LOW (ref 36.0–46.0)
Hemoglobin: 9.9 g/dL — ABNORMAL LOW (ref 12.0–15.0)
Immature Granulocytes: 1 %
Lymphocytes Relative: 33 %
Lymphs Abs: 2.8 10*3/uL (ref 0.7–4.0)
MCH: 27.7 pg (ref 26.0–34.0)
MCHC: 30.9 g/dL (ref 30.0–36.0)
MCV: 89.4 fL (ref 80.0–100.0)
Monocytes Absolute: 0.7 10*3/uL (ref 0.1–1.0)
Monocytes Relative: 8 %
Neutro Abs: 4.7 10*3/uL (ref 1.7–7.7)
Neutrophils Relative %: 54 %
Platelets: 369 10*3/uL (ref 150–400)
RBC: 3.58 MIL/uL — ABNORMAL LOW (ref 3.87–5.11)
RDW: 13.1 % (ref 11.5–15.5)
WBC: 8.5 10*3/uL (ref 4.0–10.5)
nRBC: 0 % (ref 0.0–0.2)

## 2021-02-08 LAB — COMPREHENSIVE METABOLIC PANEL
ALT: 12 U/L (ref 0–44)
AST: 17 U/L (ref 15–41)
Albumin: 3.4 g/dL — ABNORMAL LOW (ref 3.5–5.0)
Alkaline Phosphatase: 56 U/L (ref 38–126)
Anion gap: 7 (ref 5–15)
BUN: 16 mg/dL (ref 8–23)
CO2: 27 mmol/L (ref 22–32)
Calcium: 9.7 mg/dL (ref 8.9–10.3)
Chloride: 102 mmol/L (ref 98–111)
Creatinine, Ser: 0.83 mg/dL (ref 0.44–1.00)
GFR, Estimated: >60 mL/min (ref >60)
Glucose, Bld: 103 mg/dL — ABNORMAL HIGH (ref 70–99)
Potassium: 4 mmol/L (ref 3.5–5.1)
Sodium: 136 mmol/L (ref 135–145)
Total Bilirubin: 0.3 mg/dL (ref 0.3–1.2)
Total Protein: 7 g/dL (ref 6.5–8.1)

## 2021-02-08 LAB — URINALYSIS, ROUTINE W REFLEX MICROSCOPIC
Bacteria, UA: NONE SEEN
Bilirubin Urine: NEGATIVE
Glucose, UA: NEGATIVE mg/dL
Hgb urine dipstick: NEGATIVE
Ketones, ur: NEGATIVE mg/dL
Leukocytes,Ua: NEGATIVE
Nitrite: NEGATIVE
Protein, ur: 30 mg/dL — AB
Specific Gravity, Urine: 1.024 (ref 1.005–1.030)
pH: 5 (ref 5.0–8.0)

## 2021-02-08 LAB — RESP PANEL BY RT-PCR (FLU A&B, COVID) ARPGX2
Influenza A by PCR: NEGATIVE
Influenza B by PCR: NEGATIVE
SARS Coronavirus 2 by RT PCR: NEGATIVE

## 2021-02-08 LAB — LIPASE, BLOOD: Lipase: 28 U/L (ref 11–51)

## 2021-02-08 MED ORDER — DICLOFENAC SODIUM 1 % EX GEL
2.0000 g | Freq: Four times a day (QID) | CUTANEOUS | Status: DC
  Administered 2021-02-10 (×2): 2 g via TOPICAL
  Filled 2021-02-08 (×2): qty 100

## 2021-02-08 MED ORDER — AMLODIPINE BESYLATE 5 MG PO TABS
5.0000 mg | ORAL_TABLET | Freq: Every day | ORAL | Status: DC
  Administered 2021-02-09 – 2021-02-10 (×2): 5 mg via ORAL
  Filled 2021-02-08 (×2): qty 1

## 2021-02-08 MED ORDER — METFORMIN HCL 500 MG PO TABS
1000.0000 mg | ORAL_TABLET | Freq: Two times a day (BID) | ORAL | Status: DC
  Administered 2021-02-09 – 2021-02-10 (×3): 1000 mg via ORAL
  Filled 2021-02-08 (×3): qty 2

## 2021-02-08 MED ORDER — ASPIRIN EC 325 MG PO TBEC
325.0000 mg | DELAYED_RELEASE_TABLET | Freq: Every day | ORAL | Status: DC
  Administered 2021-02-09 – 2021-02-10 (×2): 325 mg via ORAL
  Filled 2021-02-08 (×2): qty 1

## 2021-02-08 MED ORDER — SERTRALINE HCL 100 MG PO TABS
100.0000 mg | ORAL_TABLET | Freq: Every day | ORAL | Status: DC
  Administered 2021-02-08 – 2021-02-10 (×3): 100 mg via ORAL
  Filled 2021-02-08 (×4): qty 1

## 2021-02-08 MED ORDER — ACETAMINOPHEN 325 MG PO TABS
650.0000 mg | ORAL_TABLET | Freq: Three times a day (TID) | ORAL | Status: DC
  Administered 2021-02-08 – 2021-02-10 (×7): 650 mg via ORAL
  Filled 2021-02-08 (×6): qty 2

## 2021-02-08 MED ORDER — ATORVASTATIN CALCIUM 10 MG PO TABS
20.0000 mg | ORAL_TABLET | Freq: Every day | ORAL | Status: DC
  Administered 2021-02-08 – 2021-02-10 (×3): 20 mg via ORAL
  Filled 2021-02-08 (×3): qty 2

## 2021-02-08 MED ORDER — GLIMEPIRIDE 2 MG PO TABS
2.0000 mg | ORAL_TABLET | Freq: Every day | ORAL | Status: DC
  Administered 2021-02-10: 2 mg via ORAL
  Filled 2021-02-08 (×2): qty 1

## 2021-02-08 MED ORDER — LEVOTHYROXINE SODIUM 88 MCG PO TABS
88.0000 ug | ORAL_TABLET | Freq: Every day | ORAL | Status: DC
  Administered 2021-02-09 – 2021-02-10 (×2): 88 ug via ORAL
  Filled 2021-02-08 (×4): qty 1

## 2021-02-08 MED ORDER — PANTOPRAZOLE SODIUM 40 MG PO TBEC
40.0000 mg | DELAYED_RELEASE_TABLET | Freq: Every day | ORAL | Status: DC
  Administered 2021-02-08 – 2021-02-10 (×3): 40 mg via ORAL
  Filled 2021-02-08 (×2): qty 1

## 2021-02-08 MED ORDER — SODIUM CHLORIDE 0.9 % IV BOLUS
1000.0000 mL | Freq: Once | INTRAVENOUS | Status: AC
  Administered 2021-02-08: 1000 mL via INTRAVENOUS

## 2021-02-08 MED ORDER — CLOPIDOGREL BISULFATE 75 MG PO TABS
75.0000 mg | ORAL_TABLET | Freq: Every day | ORAL | Status: DC
  Administered 2021-02-09 – 2021-02-10 (×2): 75 mg via ORAL
  Filled 2021-02-08 (×2): qty 1

## 2021-02-08 NOTE — ED Notes (Signed)
Provider at bedside

## 2021-02-08 NOTE — [place] FL2 (Signed)
MEDICAID FL2 LEVEL OF CARE SCREENING TOOL     IDENTIFICATION  Patient Name: Jo Larson Birthdate: Jul 18, 1932 Sex: female Admission Date (Current Location): 02/08/2021  [facility] and [place] Number:  Producer, television/film/video and Address:  [facility], [address], [place], [place] [address]      Provider Number: [id number]  Attending Physician Name and Address:  Default, Provider, MD  Relative Name and Phone Number:  Parrott,Radojka daughter-in-law   [phone]    Current Level of Care: Hospital Recommended Level of Care: Skilled Nursing Facility Prior Approval Number:    Date Approved/Denied:   PASRR Number: [id number]  Discharge Plan: SNF    Current Diagnoses: Patient Active Problem List   Diagnosis Date Noted   Leucocytosis 01/20/2021   Hypomagnesemia 01/20/2021   Left wrist pain 01/20/2021   Stroke (cerebrum) (HCC) 01/05/2021   CVA (cerebral vascular accident) (HCC) 12/30/2020   Dyslipidemia 10/14/2016   Coronary artery disease involving native coronary artery of native heart without angina pectoris 10/14/2016   Chest pain, atypical 04/27/2015   CAD S/P percutaneous coronary angioplasty 07/27/2014   Abnormal chest x-ray 07/27/2014   History of non-ST elevation myocardial infarction (NSTEMI) 07/24/2014   Hypokalemia 07/24/2014   Angioedema 04/03/2013   Acute respiratory failure (HCC) 04/03/2013   POSITIVE PPD 05/17/2010   URINALYSIS, ABNORMAL 04/24/2010   SYSTOLIC MURMUR 09/13/2009   Hypothyroidism 01/05/2008   Hyperlipidemia 03/03/2007   ANXIETY DISORDER, GENERALIZED 03/03/2007   INSOMNIA, PERSISTENT 03/03/2007   OSTEOARTHRITIS 03/03/2007   DEFICIENCY, VITAMIN D NOS 08/05/2006   Type 2 diabetes mellitus without complication, without long-term current use of insulin (HCC) 06/04/1997   Essential hypertension 07/23/1981    Orientation RESPIRATION BLADDER Height & Weight     Self, Situation  Normal Incontinent Weight:  164 lb (74.4 kg) Height:  5' (152.4 cm)  BEHAVIORAL SYMPTOMS/MOOD NEUROLOGICAL BOWEL NUTRITION STATUS      Incontinent Diet (regular)  AMBULATORY STATUS COMMUNICATION OF NEEDS Skin   Extensive Assist Verbally (speaks [place]) Normal                       Personal Care Assistance Level of Assistance  Bathing, Feeding, Dressing Bathing Assistance: Limited assistance Feeding assistance: Limited assistance Dressing Assistance: Limited assistance     Functional Limitations Info  Sight, Hearing, Speech Sight Info: Impaired (wears glasses to watch television) Hearing Info: Adequate Speech Info: Adequate (speaks only [place])    SPECIAL CARE FACTORS FREQUENCY  PT (By licensed PT), OT (By licensed OT)     PT Frequency: 5x weekly OT Frequency: 5x weekly            Contractures Contractures Info: Not present    Additional Factors Info  Code Status, Allergies Code Status Info: Full Allergies Info: (4)Clindamycin/lincomycin,Peanut Butter Flavor,Peanuts (Peanut Oil),Losartan           Current Medications (02/08/2021):  This is the current hospital active medication list Current Facility-Administered Medications  Medication Dose Route Frequency Provider Last Rate Last Admin   acetaminophen (TYLENOL) tablet 650 mg  650 mg Oral TID AC & HS Curatolo, Adam, DO       [START ON 02/09/2021] amLODipine (NORVASC) tablet 5 mg  5 mg Oral Daily Curatolo, Adam, DO       [START ON 02/09/2021] aspirin EC tablet 325 mg  325 mg Oral Daily Curatolo, Adam, DO       atorvastatin (LIPITOR) tablet 20 mg  20 mg Oral Daily Virgina Norfolk,  DO       [START ON 02/09/2021] clopidogrel (PLAVIX) tablet 75 mg  75 mg Oral Daily Curatolo, Adam, DO       [START ON 02/09/2021] diclofenac Sodium (VOLTAREN) 1 % topical gel 2 g  2 g Topical QID Curatolo, Adam, DO       [START ON 02/09/2021] glimepiride (AMARYL) tablet 2 mg  2 mg Oral Daily Curatolo, Adam, DO       [START ON 02/09/2021] levothyroxine (SYNTHROID)  tablet 88 mcg  88 mcg Oral Daily Curatolo, Adam, DO       [START ON 02/09/2021] metFORMIN (GLUCOPHAGE) tablet 1,000 mg  1,000 mg Oral BID WC Curatolo, Adam, DO       pantoprazole (PROTONIX) EC tablet 40 mg  40 mg Oral Daily Curatolo, Adam, DO       sertraline (ZOLOFT) tablet 100 mg  100 mg Oral Daily Curatolo, Adam, DO       Current Outpatient Medications  Medication Sig Dispense Refill   acetaminophen (TYLENOL) 325 MG tablet Take 2 tablets (650 mg total) by mouth 4 (four) times daily -  before meals and at bedtime.     amLODipine (NORVASC) 10 MG tablet TAKE 1 TABLET(10 MG) BY MOUTH DAILY     amLODipine (NORVASC) 5 MG tablet Take 1 tablet (5 mg total) by mouth daily. 30 tablet 0   aspirin 325 MG EC tablet Take 1 tablet (325 mg total) by mouth daily. 100 tablet 12   atorvastatin (LIPITOR) 20 MG tablet Take 1 tablet (20 mg total) by mouth daily. 30 tablet 0   cloNIDine (CATAPRES) 0.1 MG tablet Take 1 tablet (0.1 mg total) by mouth 2 (two) times daily. 60 tablet 0   clopidogrel (PLAVIX) 75 MG tablet Take 1 tablet (75 mg total) by mouth daily. 30 tablet 0   diclofenac Sodium (VOLTAREN) 1 % GEL Apply 2 g topically 4 (four) times daily. To left wrist 300 g 0   glimepiride (AMARYL) 2 MG tablet Take 1 tablet (2 mg total) by mouth daily. 30 tablet 0   levothyroxine (SYNTHROID) 88 MCG tablet Take 1 tablet (88 mcg total) by mouth daily. 30 tablet 0   losartan (COZAAR) 50 MG tablet Take 1 tablet (50 mg total) by mouth daily with supper. 30 tablet 0   magnesium oxide (MAG-OX) 400 (240 Mg) MG tablet Take 1 tablet (400 mg total) by mouth 2 (two) times daily. 60 tablet 0   metFORMIN (GLUCOPHAGE) 1000 MG tablet Take 1 tablet (1,000 mg total) by mouth 2 (two) times daily with a meal. 60 tablet 0   metoprolol tartrate (LOPRESSOR) 25 MG tablet Take by mouth.     pantoprazole (PROTONIX) 40 MG tablet Take 1 tablet (40 mg total) by mouth daily. 30 tablet 0   sertraline (ZOLOFT) 100 MG tablet Take 1 tablet (100 mg  total) by mouth daily. 30 tablet 0     Discharge Medications: Please see discharge summary for a list of discharge medications.  Relevant Imaging Results:  Relevant Lab Results:   Additional Information SSN: [id number]. Not vaccinated in [place] system, Covid negative tested 02/08/2021  Tessie Ordaz, LCSW

## 2021-02-08 NOTE — Care Management (Signed)
ED RNCM reviewing chart and noted that patient was recently discharged home with family s/p CVA , patient is here today after being brought in by family who reports they are not able to care for her at home.  They are requesting placement. ED RNCM consulted with ED CSW to assist with next LOC.

## 2021-02-08 NOTE — Social Work (Signed)
CSW sent referrals to several area SNFs.  PT eval still pending.

## 2021-02-08 NOTE — ED Notes (Signed)
Received verbal report from Brittany B RN at this time  

## 2021-02-08 NOTE — ED Provider Notes (Signed)
[facility] EMERGENCY DEPARTMENT Provider Note   CSN: [id number] Arrival date & time: 02/08/21  1857     History Chief Complaint  Patient presents with   Altered Mental Status    Jo Larson is a 87 y.o. female.  History provided mostly by family member.  Interpreter used.  Patient had stroke about a month ago.  Had a carotid stent placed.  Went to rehab and now has been back at home with home health.  Mostly using a wheelchair at times.  Left-sided weakness since stroke.  Patient having episodes of confusion and overall family does not feel safe with her at home.  Family member states that they are about to start work again in a few weeks and need her placed in a long-term nursing facility.  She cannot be left home alone and overall they are concerned about her safety.  There has been no new falls or no new neurological deficits.  The history is provided by the patient.  Altered Mental Status Presenting symptoms: confusion (possible some hallucinations)   Severity:  Mild Episode history:  Multiple Timing:  Intermittent Progression:  Waxing and waning Chronicity:  New Associated symptoms: no abdominal pain, no agitation, no fever, no palpitations, no rash, no seizures and no vomiting       Past Medical History:  Diagnosis Date   Abnormal chest x-ray    Angioedema    a. 05/2014 - felt to be 2/2 clindamycin therapy.   Anxiety    CAD (coronary artery disease)    a. 07/2014 NSTEMI/PCI: LM nl, LAD min irregs, LCX 53m (4.0x18 Xience DES), LPDA 90 (54mm vessel->med Rx), RCA nl, EF 60%.   Diabetes mellitus without complication (HCC)    Hyperlipemia    Hypertension    Hypothyroidism     Patient Active Problem List   Diagnosis Date Noted   Leucocytosis 01/20/2021   Hypomagnesemia 01/20/2021   Left wrist pain 01/20/2021   Stroke (cerebrum) (HCC) 01/05/2021   CVA (cerebral vascular accident) (HCC) 12/30/2020   Dyslipidemia 10/14/2016   Coronary artery  disease involving native coronary artery of native heart without angina pectoris 10/14/2016   Chest pain, atypical 04/27/2015   CAD S/P percutaneous coronary angioplasty 07/27/2014   Abnormal chest x-ray 07/27/2014   History of non-ST elevation myocardial infarction (NSTEMI) 07/24/2014   Hypokalemia 07/24/2014   Angioedema 04/03/2013   Acute respiratory failure (HCC) 04/03/2013   POSITIVE PPD 05/17/2010   URINALYSIS, ABNORMAL 04/24/2010   SYSTOLIC MURMUR 09/13/2009   Hypothyroidism 01/05/2008   Hyperlipidemia 03/03/2007   ANXIETY DISORDER, GENERALIZED 03/03/2007   INSOMNIA, PERSISTENT 03/03/2007   OSTEOARTHRITIS 03/03/2007   DEFICIENCY, VITAMIN D NOS 08/05/2006   Type 2 diabetes mellitus without complication, without long-term current use of insulin (HCC) 06/04/1997   Essential hypertension 07/23/1981    Past Surgical History:  Procedure Laterality Date   INTUBATION NASOTRACHEAL N/A 04/03/2013   Procedure: FIBEROPTIC NASAL-TRACHEAL INTUBATION;  Surgeon: Christia Reading, MD;  Location: [facility] ORS;  Service: ENT;  Laterality: N/A;   IR ANGIO INTRA EXTRACRAN SEL COM CAROTID INNOMINATE BILAT MOD SED  01/03/2021   IR ANGIO VERTEBRAL SEL VERTEBRAL UNI R MOD SED  01/03/2021   IR INTRAVSC STENT CERV CAROTID W/EMB-PROT MOD SED INCL ANGIO  01/03/2021   IR US GUIDE VASC ACCESS RIGHT  01/03/2021   LEFT HEART CATHETERIZATION WITH CORONARY ANGIOGRAM N/A 07/26/2014   Procedure: LEFT HEART CATHETERIZATION WITH CORONARY ANGIOGRAM;  Surgeon: Runell Gess, MD;  Location: [facility] CATH LAB;  Service: Cardiovascular;  Laterality: N/A;   RADIOLOGY WITH ANESTHESIA Right 01/03/2021   Procedure: IR WITH ANESTHESIA - CAROTID STENT;  Surgeon: Baldemar Lenis, MD;  Location: [facility] OR;  Service: Radiology;  Laterality: Right;     OB History   No obstetric history on file.     Family History  Problem Relation Age of Onset   Hypertension Father    Diabetes Father     Social History   Tobacco Use    Smoking status: Never   Smokeless tobacco: Never  Vaping Use   Vaping Use: Never used  Substance Use Topics   Alcohol use: No   Drug use: No    Home Medications Prior to Admission medications   Medication Sig Start Date End Date Taking? Authorizing Provider  acetaminophen (TYLENOL) 325 MG tablet Take 2 tablets (650 mg total) by mouth 4 (four) times daily -  before meals and at bedtime. 01/20/21   Love, Evlyn Kanner, PA-C  amLODipine (NORVASC) 10 MG tablet TAKE 1 TABLET(10 MG) BY MOUTH DAILY 09/27/20   [provider]  amLODipine (NORVASC) 5 MG tablet Take 1 tablet (5 mg total) by mouth daily. 01/20/21   Love, Evlyn Kanner, PA-C  aspirin 325 MG EC tablet Take 1 tablet (325 mg total) by mouth daily. 01/20/21   Love, Evlyn Kanner, PA-C  atorvastatin (LIPITOR) 20 MG tablet Take 1 tablet (20 mg total) by mouth daily. 01/20/21   Love, Evlyn Kanner, PA-C  cloNIDine (CATAPRES) 0.1 MG tablet Take 1 tablet (0.1 mg total) by mouth 2 (two) times daily. 01/20/21   Love, Evlyn Kanner, PA-C  clopidogrel (PLAVIX) 75 MG tablet Take 1 tablet (75 mg total) by mouth daily. 01/20/21   Love, Evlyn Kanner, PA-C  diclofenac Sodium (VOLTAREN) 1 % GEL Apply 2 g topically 4 (four) times daily. To left wrist 01/20/21   Love, Evlyn Kanner, PA-C  glimepiride (AMARYL) 2 MG tablet Take 1 tablet (2 mg total) by mouth daily. 01/20/21   Love, Evlyn Kanner, PA-C  levothyroxine (SYNTHROID) 88 MCG tablet Take 1 tablet (88 mcg total) by mouth daily. 01/20/21   Love, Evlyn Kanner, PA-C  losartan (COZAAR) 50 MG tablet Take 1 tablet (50 mg total) by mouth daily with supper. 01/20/21   Love, Evlyn Kanner, PA-C  magnesium oxide (MAG-OX) 400 (240 Mg) MG tablet Take 1 tablet (400 mg total) by mouth 2 (two) times daily. 01/20/21   Love, Evlyn Kanner, PA-C  metFORMIN (GLUCOPHAGE) 1000 MG tablet Take 1 tablet (1,000 mg total) by mouth 2 (two) times daily with a meal. 01/20/21   Love, Evlyn Kanner, PA-C  metoprolol tartrate (LOPRESSOR) 25 MG tablet Take by mouth. 12/07/19   [provider]   pantoprazole (PROTONIX) 40 MG tablet Take 1 tablet (40 mg total) by mouth daily. 01/20/21   Love, Evlyn Kanner, PA-C  sertraline (ZOLOFT) 100 MG tablet Take 1 tablet (100 mg total) by mouth daily. 01/20/21   Love, Evlyn Kanner, PA-C    Allergies    Clindamycin/lincomycin, Peanut butter flavor, Peanuts [peanut oil], and Losartan  Review of Systems   Review of Systems  Constitutional:  Negative for chills and fever.  HENT:  Negative for ear pain and sore throat.   Eyes:  Negative for pain and visual disturbance.  Respiratory:  Negative for cough and shortness of breath.   Cardiovascular:  Negative for chest pain and palpitations.  Gastrointestinal:  Negative for abdominal pain and vomiting.  Genitourinary:  Negative for dysuria and hematuria.  Musculoskeletal:  Negative for arthralgias and back pain.  Skin:  Negative for color change and rash.  Neurological:  Negative for seizures and syncope.  Psychiatric/Behavioral:  Positive for confusion (possible some hallucinations). Negative for agitation.   All other systems reviewed and are negative.  Physical Exam Updated Vital Signs BP (!) 161/83   Pulse (!) 50   Temp 98.2 F (36.8 C) (Oral)   Resp 15   Ht 5' (1.524 m)   Wt 74.4 kg   SpO2 97%   BMI 32.03 kg/m   Physical Exam Vitals and nursing note reviewed.  Constitutional:      General: She is not in acute distress.    Appearance: She is well-developed. She is not ill-appearing.  HENT:     Head: Normocephalic and atraumatic.     Mouth/Throat:     Mouth: Mucous membranes are moist.  Eyes:     Extraocular Movements: Extraocular movements intact.     Conjunctiva/sclera: Conjunctivae normal.     Pupils: Pupils are equal, round, and reactive to light.  Cardiovascular:     Rate and Rhythm: Normal rate and regular rhythm.     Pulses: Normal pulses.     Heart sounds: Normal heart sounds. No murmur heard. Pulmonary:     Effort: Pulmonary effort is normal. No respiratory distress.      Breath sounds: Normal breath sounds.  Abdominal:     Palpations: Abdomen is soft.     Tenderness: There is no abdominal tenderness.  Musculoskeletal:        General: Normal range of motion.     Cervical back: Normal range of motion and neck supple.  Skin:    General: Skin is warm and dry.     Capillary Refill: Capillary refill takes less than 2 seconds.  Neurological:     General: No focal deficit present.     Mental Status: She is alert.     Comments: Left-sided weakness, 5+ out of 5 strength on the right side, normal sensation throughout, normal speech, normal finger-to-nose finger, can tell me her name, does not appear to have any facial weakness or cranial nerve deficit    ED Results / Procedures / Treatments   Labs (all labs ordered are listed, but only abnormal results are displayed) Labs Reviewed  CBC WITH DIFFERENTIAL/PLATELET - Abnormal; Notable for the following components:      Result Value   RBC 3.58 (*)    Hemoglobin 9.9 (*)    HCT 32.0 (*)    All other components within normal limits  COMPREHENSIVE METABOLIC PANEL - Abnormal; Notable for the following components:   Glucose, Bld 103 (*)    Albumin 3.4 (*)    All other components within normal limits  URINALYSIS, ROUTINE W REFLEX MICROSCOPIC - Abnormal; Notable for the following components:   Protein, ur 30 (*)    All other components within normal limits  RESP PANEL BY RT-PCR (FLU A&B, COVID) [id number]  URINE CULTURE  LIPASE, BLOOD    EKG EKG Interpretation  Date/Time:  Wednesday February 08 2021 19:19:16 EDT Ventricular Rate:  67 PR Interval:  162 QRS Duration: 89 QT Interval:  401 QTC Calculation: 424 R Axis:   23 Text Interpretation: Sinus rhythm Nonspecific T abnormalities, diffuse leads No significant change since last tracing Confirmed by Virgina Norfolk ([id number]) on 02/08/2021 7:22:35 PM  Radiology CT Head Wo Contrast  Result Date: 02/08/2021 CLINICAL DATA:  Confusion EXAM: CT HEAD WITHOUT CONTRAST  TECHNIQUE: Contiguous axial images were  obtained from the base of the skull through the vertex without intravenous contrast. COMPARISON:  None. FINDINGS: Brain: Sequela of prior infarct in the right posterior frontoparietal region (series 3/image 22). No new/acute infarction. No hemorrhage, hydrocephalus, extra-axial collection, or mass lesion/mass effect. Mild cortical and central atrophy. Subcortical white matter and periventricular small vessel ischemic changes. Vascular: Intracranial atherosclerosis. Skull: Normal. Negative for fracture or focal lesion. Sinuses/Orbits: The visualized paranasal sinuses are essentially clear. The mastoid air cells are unopacified. Other: None. IMPRESSION: Sequela of prior infarct in the right posterior frontoparietal region. No evidence of acute intracranial abnormality. Atrophy with small vessel ischemic changes. Electronically Signed   By: Charline BillsSriyesh  Krishnan M.D.   On: 02/08/2021 20:21   DG Chest Portable 1 View  Result Date: 02/08/2021 CLINICAL DATA:  Recent CVA, left-sided weakness, intermittent confusion EXAM: PORTABLE CHEST 1 VIEW COMPARISON:  01/05/2021 FINDINGS: Single frontal view of the chest demonstrates an unremarkable cardiac silhouette. No airspace disease, effusion, or pneumothorax. No acute bony abnormality. IMPRESSION: 1. No acute intrathoracic process. Electronically Signed   By: Sharlet SalinaMichael  Brown M.D.   On: 02/08/2021 21:14    Procedures Procedures   Medications Ordered in ED Medications  sodium chloride 0.9 % bolus 1,000 mL (1,000 mLs Intravenous New Bag/Given 02/08/21 2036)    ED Course  I have reviewed the triage vital signs and the nursing notes.  Pertinent labs & imaging results that were available during my care of the patient were reviewed by me and considered in my medical decision making (see chart for details).    MDM Rules/Calculators/A&P                           Adriel Pittmon is here with confusion.  Normal vitals.  No fever.   Family member at the bedside.  Both family members to be [place].  Interpreter used.  Overall family member states that having difficult time taking care of her at home.  She is having episodes of confusion and seeing things that are not there.  She has chronic left-sided weakness.  No new weakness on exam.  Neurologically appears to be at her baseline.  She is pleasant and alert.  She denies any pain.  Family states that they believe patient needs long-term placement.  She did well in rehab but since she has been home it has been difficult to take care of herself.  Mostly using a wheelchair.  She has been eating and drinking well.  But family member does not feel safe taking care of her at home and they do not feel safe leaving patient home alone and eventually family members need to go back to work starting in 2 weeks.  Overall vital signs are unremarkable.  We will check lab work, chest x-ray, head CT, urine studies to see if there is any reason why may be confusion is but overall suspect that this is sequelae from recent stroke and likely her new baseline at her facility.  Case manager will see.  Lab work is overall unremarkable.  No pneumonia, no COVID or flu.  No urine infection.  Head CT unremarkable.  Overall there is no acute process.  After discussion with case manager and family we will have patient go to rehab.  She still qualifies for another week of inpatient rehab and that we will likely have her placed in long-term facility from there.  Family is amenable to this plan.  Home medications have been ordered.  This chart was dictated using voice recognition software.  Despite best efforts to proofread,  errors can occur which can change the documentation meaning.   Final Clinical Impression(s) / ED Diagnoses Final diagnoses:  Altered mental status, unspecified altered mental status type    Rx / DC Orders ED Discharge Orders     None        Virgina Norfolk, DO 02/08/21 2153

## 2021-02-08 NOTE — Care Management (Signed)
ED RNCM reviewed patient's re cord and noted that patient was recently discharged from [facility] s/p CVA. Patient brought in by EMS this evening with AMS, as per family, patient and family speaks only [place].  Noted patient was set up with [facility] .  ED RNCM and EDP met with patient and daughter-in-law at bed via  the interpreter webcam.  Daughter-in-law states that she is unable to care for her any longer.  ED workup still in progress. TOC will continue to follow for a TOC plan.

## 2021-02-08 NOTE — ED Triage Notes (Signed)
Pt BIB [facility] from home c/o AMS. Pt had a stroke on 6/15 and was admitted till 7/1. Pt was left with left sided deficits and weakness. Since coming home per family pt has had intermittent confusion. Pt was alert and oriented this morning and then around 2pm became confused then became normal again.

## 2021-02-09 DIAGNOSIS — R4182 Altered mental status, unspecified: Secondary | ICD-10-CM | POA: Diagnosis not present

## 2021-02-09 NOTE — ED Notes (Signed)
Attempted to admin medication and pt refused. Attempted to use an interpretor and none was available to speak with the pt. Attempted to call family and there was no answer

## 2021-02-09 NOTE — ED Notes (Signed)
Found pt pulling off monitor equipment at this time. Replaced monitor and advised pt to leave them alone at this time

## 2021-02-09 NOTE — Progress Notes (Signed)
CSW reviewed patient chart. No PT evaluation was ordered. PT evaluation has now been ordered. CSW will wait and see what the recommendations are.

## 2021-02-09 NOTE — Progress Notes (Signed)
CSW spoke with the family to let them know CSW is waiting for PT to see patient. CSW stated after PT sees patient then there will be an recommendation if she meets SNF criteria. CSW let the family know that [facility], [facility], and [facility] accepted patient so far. CSW spoke with patients daughter in law's son who speaks english. CSW went over the ratings from Medicare.Gov. The family does NOT want patient to go to [facility] or [facility] because of the 1 star rating. CSW let the family know [facility] a 3 star. The family stated it was too far and prefers [place]. CSW stated it depends on who currently has beds and also has LTC. CSW stated if neither are available then patient will have to be discharged home. CSW explained if they did not chose from the facilities that accept patient then she cannot stay in the hospital long term. The family understood. CSW stated she will call the other facilities on the list who have not responded about a bed offer. Family plans on discussing their options.

## 2021-02-09 NOTE — Progress Notes (Signed)
[name] willing to take patient pending PT evaluation

## 2021-02-09 NOTE — Progress Notes (Signed)
CSW contacted the following facilities who have not responded in the HUB about bed offers   [facility]- Left a message   [facility]- No beds until Monday 02/13/21  [facility]- Might have 1 bed. Plans on reviewing and calling CSW back   [facility]- Left Message   [facility]- Left Message  [facility]- There is a waiting list for LTC. There is over 100 people on that list  [name]- CSW was told by Malena Peer in admissions that their Translation system is not setup at this time. Clive plans on reaching out to his administrator to see when it will be setup. Malena Peer is unsure about the time frame and stated when he hears something he would call CSW back.    The Following facilities have declined patient for SNF  [facility]

## 2021-02-09 NOTE — Evaluation (Signed)
Physical Therapy Evaluation Patient Details Name: Jo Larson MRN: [id number] DOB: [birth year] Today's Date: 02/09/2021   History of Present Illness  87yo female presents to [facility] ED on 7/20 with altered mental status, confusion, and family worried for her safety.  CT scan showed prior infarct of R frontoparietal lobe; EKG had no significant findings. PMH: CAD, DM, anxiety, HTN, hypothyroidism  Clinical Impression  Pt presenting with problem above with deficits below. Requiring max A +2 for bed mobility and transfers and mod A +2 to take side steps at EOB. Pt very fearful of performing mobility tasks initially, but improved as mobility progressed. Per notes and case manager, pt has had increased difficulty with performing mobility tasks at home and has required increased assist. Recommending SNF level therapies at d/c to increase independence and safety. Will continue to follow acutely.     Follow Up Recommendations SNF    Equipment Recommendations  Wheelchair (measurements PT);Wheelchair cushion (measurements PT);Hospital bed    Recommendations for Other Services       Precautions / Restrictions Precautions Precautions: Fall Restrictions Weight Bearing Restrictions: No      Mobility  Bed Mobility Overal bed mobility: Needs Assistance Bed Mobility: Supine to Sit;Sit to Supine     Supine to sit: Max assist;+2 for physical assistance Sit to supine: Max assist;+2 for physical assistance   General bed mobility comments: Required increased time to perform. Multimodal cues for sequencing. Max A +2 for trunk and LE assist. Used bed pad to scoot hips to EOB.    Transfers Overall transfer level: Needs assistance Equipment used: 2 person hand held assist Transfers: Sit to/from Stand Sit to Stand: Max assist;+2 physical assistance         General transfer comment: Max A +2 for lift assist and steadying to stand. Used bed pad to boost hips up into  standing.  Ambulation/Gait Ambulation/Gait assistance: Mod assist;+2 physical assistance   Assistive device: 2 person hand held assist       General Gait Details: Performed side steps at EOB with mod A +2 for steadying. Required max multimodal cues for sequencing and required assist to weightshift. Required manual assist to move LLE to the side.  Stairs            Wheelchair Mobility    Modified Rankin (Stroke Patients Only)       Balance Overall balance assessment: Needs assistance Sitting-balance support: Single extremity supported Sitting balance-Leahy Scale: Poor Sitting balance - Comments: Pt required mod assistance to remain seated upright   Standing balance support: Bilateral upper extremity supported Standing balance-Leahy Scale: Poor Standing balance comment: Required external assist and UE support                             Pertinent Vitals/Pain Pain Assessment: Faces Faces Pain Scale: No hurt    Home Living Family/patient expects to be discharged to:: Private residence Living Arrangements: Other relatives (Grandsons (?Major and Marko)) Available Help at Discharge: Family Type of Home: House Home Access: Stairs to enter   Secretary/administrator of Steps: 4-5 (Pt unable to articulate # of steps; per previous notes, has 4-5) Home Layout: One level Home Equipment: Wheelchair - manual Additional Comments: Unsure of accuracy given cognitive deficits; no family present    Prior Function Level of Independence: Needs assistance   Gait / Transfers Assistance Needed: Pt unable to report. Per MD notes, was using WC.  ADL's / Homemaking Assistance Needed: Pt reports  she needed assist with bathing. Unsure of assist required for other ADLs.        Hand Dominance        Extremity/Trunk Assessment   Upper Extremity Assessment Upper Extremity Assessment: LUE deficits/detail LUE Deficits / Details: LUE weakness at baseline secondary to CVA.     Lower Extremity Assessment Lower Extremity Assessment: LLE deficits/detail LLE Deficits / Details: LLE weakness at baseline secondary to CVA.    Cervical / Trunk Assessment Cervical / Trunk Assessment: Kyphotic  Communication   Communication: Prefers language other than English;Interpreter utilized Engineer, materials; Financial planner [phone])  Cognition Arousal/Alertness: Awake/alert Behavior During Therapy: Anxious;Flat affect Overall Cognitive Status: No family/caregiver present to determine baseline cognitive functioning Area of Impairment: Orientation;Memory;Awareness;Problem solving;Safety/judgement;Attention;Following commands                 Orientation Level: Disoriented to;Place;Time;Situation Current Attention Level: Sustained Memory: Decreased short-term memory Following Commands: Follows one step commands with increased time Safety/Judgement: Decreased awareness of deficits Awareness: Intellectual Problem Solving: Slow processing;Difficulty sequencing;Requires tactile cues;Requires verbal cues General Comments: Pt was not able to state where she was, the reason for being in the hospital, or the date. Slow processing, difficulty sequencing when attempting to take steps at EOB. Very fearful to perform mobility tasks initially.      General Comments General comments (skin integrity, edema, etc.): No family present    Exercises     Assessment/Plan    PT Assessment Patient needs continued PT services  PT Problem List Decreased strength;Decreased range of motion;Decreased activity tolerance;Decreased balance;Decreased mobility;Decreased coordination;Decreased cognition;Decreased knowledge of use of DME;Decreased safety awareness;Decreased knowledge of precautions       PT Treatment Interventions Functional mobility training;Therapeutic activities;Gait training;Therapeutic exercise;Balance training;Neuromuscular re-education;Cognitive remediation;Patient/family education;DME  instruction;Wheelchair mobility training    PT Goals (Current goals can be found in the Care Plan section)  Acute Rehab PT Goals PT Goal Formulation: Patient unable to participate in goal setting Time For Goal Achievement: 02/23/21 Potential to Achieve Goals: Fair    Frequency Min 2X/week   Barriers to discharge Decreased caregiver support Pt's family reports returning to work in two weeks and inability to care for pt after that point; family is seeking SNF    Co-evaluation               AM-PAC PT "6 Clicks" Mobility  Outcome Measure Help needed turning from your back to your side while in a flat bed without using bedrails?: A Lot Help needed moving from lying on your back to sitting on the side of a flat bed without using bedrails?: Total Help needed moving to and from a bed to a chair (including a wheelchair)?: Total Help needed standing up from a chair using your arms (e.g., wheelchair or bedside chair)?: Total Help needed to walk in hospital room?: Total Help needed climbing 3-5 steps with a railing? : Total 6 Click Score: 7    End of Session Equipment Utilized During Treatment: Gait belt Activity Tolerance: Patient tolerated treatment well Patient left: in bed;with call bell/phone within reach;with bed alarm set (in bed in ED) Nurse Communication: Mobility status PT Visit Diagnosis: Muscle weakness (generalized) (M62.81);Difficulty in walking, not elsewhere classified (R26.2);Hemiplegia and hemiparesis Hemiplegia - Right/Left: Left Hemiplegia - caused by: Cerebral infarction    Time: 1351-1418 PT Time Calculation (min) (ACUTE ONLY): 27 min   Charges:   PT Evaluation $PT Eval Moderate Complexity: 1 Mod PT Treatments $Therapeutic Activity: 8-22 mins        [place], PT,  DPT  Acute Rehabilitation Services  Pager: [phone] Office: [phone]   Lehman Prom 02/09/2021, 3:15 PM

## 2021-02-09 NOTE — ED Notes (Signed)
SNF  Breakfast Ordered 

## 2021-02-10 DIAGNOSIS — R4182 Altered mental status, unspecified: Secondary | ICD-10-CM | POA: Diagnosis not present

## 2021-02-10 LAB — URINE CULTURE

## 2021-02-10 LAB — CBG MONITORING, ED: Glucose-Capillary: 158 mg/dL — ABNORMAL HIGH (ref 70–99)

## 2021-02-10 MED ORDER — CLONIDINE HCL 0.1 MG PO TABS
0.1000 mg | ORAL_TABLET | Freq: Two times a day (BID) | ORAL | Status: DC
  Administered 2021-02-10 (×2): 0.1 mg via ORAL
  Filled 2021-02-10 (×2): qty 1

## 2021-02-10 NOTE — ED Notes (Signed)
Assisted with meal

## 2021-02-10 NOTE — Progress Notes (Signed)
CSW is waiting for the translator contract to be completed at [facility] so patient can be discharged. [name] stated they would call back when its complete. CSW was told it may not be today but they will be working tomorrow as well.

## 2021-02-10 NOTE — Progress Notes (Signed)
Patient will go to [facility], room 203A - she will be transported via [facility]. [facility] has been called for pick up as first available.  The number to call for report is [phone].  CSW informed Maggie, Charity fundraiser of information.  Edwin Dada, MSW, LCSW Transitions of Care  Clinical Social Worker II [phone]

## 2021-02-10 NOTE — ED Notes (Signed)
Attempt to call report to [facility], no answer, will continue to try to reach facility

## 2021-02-10 NOTE — ED Notes (Signed)
Call to grandson Marko to update on bed assignment per daughter's request.  Still awaiting [facility] for transport

## 2021-02-10 NOTE — ED Notes (Signed)
Interpreter used to update pt on plan of care, pt said that she would like to try to eat something - this RN assisted pt with eating some peaches and water.

## 2021-02-10 NOTE — ED Notes (Signed)
Pt incontinent of bladder. Linen and brief changed. Pt repositioned in the bed. Pt took meds with ease.

## 2021-02-10 NOTE — ED Notes (Signed)
Archer Asa, grandson, [phone] would like a call when pt is leaving to [facility]

## 2021-02-10 NOTE — ED Notes (Signed)
Pt was spitting out food. Cleaned pt, changed blankets, and provided suction at the bedside.

## 2021-02-10 NOTE — ED Notes (Signed)
Pt SBP 220. RN made EDP aware. See new orders.

## 2021-02-10 NOTE — ED Provider Notes (Signed)
Emergency Medicine Observation Re-evaluation Note  Jo Larson is a 87 y.o. female, seen on rounds today.  Pt initially presented to the ED for complaints of Altered Mental Status Currently, the patient is sleeping.  Physical Exam  BP (!) 152/121 (BP Location: Right Arm)   Pulse 80   Temp 98 F (36.7 C) (Oral)   Resp 20   Ht 5' (1.524 m)   Wt 74.4 kg   SpO2 95%   BMI 32.03 kg/m  Physical Exam General: sleeping Cardiac: RRR without murmur Lungs: CTAB Psych:  pt has been altered per nurse but directable while awake  ED Course / MDM  EKG:EKG Interpretation  Date/Time:  Wednesday February 08 2021 19:19:16 EDT Ventricular Rate:  67 PR Interval:  162 QRS Duration: 89 QT Interval:  401 QTC Calculation: 424 R Axis:   23 Text Interpretation: Sinus rhythm Nonspecific T abnormalities, diffuse leads No significant change since last tracing Confirmed by Virgina Norfolk ([id number]) on 02/08/2021 7:22:35 PM  I have reviewed the labs performed to date as well as medications administered while in observation.  Recent changes in the last 24 hours include none.  Plan  Current plan is for placement at [facility].  Jo Larson is not under involuntary commitment.     Gwyneth Sprout, MD 02/10/21 1539

## 2021-02-10 NOTE — Progress Notes (Signed)
Jo Larson with [name] stated he is not at the office yet and when he gets to the office he has morning meetings. CSW updated admissions to let them know insurance authorization was approved. CSW was told by admissions he would call back after his morning meetings.

## 2021-02-11 NOTE — ED Notes (Signed)
[facility] to take pt to [facility]. Report given to [facility] by previous RN

## 2021-02-11 NOTE — ED Notes (Signed)
This RN spoke to Catron at [facility] to double check that pt is okay to come back. Victorino Dike confirmed.

## 2021-02-15 ENCOUNTER — Emergency Department (HOSPITAL_COMMUNITY): Payer: Medicare Other

## 2021-02-15 ENCOUNTER — Other Ambulatory Visit: Payer: Self-pay

## 2021-02-15 ENCOUNTER — Observation Stay (HOSPITAL_COMMUNITY)
Admission: EM | Admit: 2021-02-15 | Discharge: 2021-02-18 | Disposition: A | Discharge status: Home / Self Care | Payer: Medicare Other | Attending: Internal Medicine | Admitting: Internal Medicine

## 2021-02-15 ENCOUNTER — Encounter (HOSPITAL_COMMUNITY): Payer: Self-pay | Admitting: Family Medicine

## 2021-02-15 DIAGNOSIS — I10 Essential (primary) hypertension: Secondary | ICD-10-CM | POA: Diagnosis not present

## 2021-02-15 DIAGNOSIS — F0390 Unspecified dementia without behavioral disturbance: Secondary | ICD-10-CM

## 2021-02-15 DIAGNOSIS — Z20822 Contact with and (suspected) exposure to covid-19: Secondary | ICD-10-CM | POA: Diagnosis not present

## 2021-02-15 DIAGNOSIS — Z7984 Long term (current) use of oral hypoglycemic drugs: Secondary | ICD-10-CM | POA: Diagnosis not present

## 2021-02-15 DIAGNOSIS — S40012A Contusion of left shoulder, initial encounter: Secondary | ICD-10-CM | POA: Insufficient documentation

## 2021-02-15 DIAGNOSIS — D72829 Elevated white blood cell count, unspecified: Secondary | ICD-10-CM | POA: Diagnosis not present

## 2021-02-15 DIAGNOSIS — I2510 Atherosclerotic heart disease of native coronary artery without angina pectoris: Secondary | ICD-10-CM | POA: Diagnosis not present

## 2021-02-15 DIAGNOSIS — R55 Syncope and collapse: Secondary | ICD-10-CM | POA: Diagnosis present

## 2021-02-15 DIAGNOSIS — E119 Type 2 diabetes mellitus without complications: Secondary | ICD-10-CM

## 2021-02-15 DIAGNOSIS — W19XXXA Unspecified fall, initial encounter: Secondary | ICD-10-CM | POA: Insufficient documentation

## 2021-02-15 DIAGNOSIS — Y92129 Unspecified place in nursing home as the place of occurrence of the external cause: Secondary | ICD-10-CM | POA: Insufficient documentation

## 2021-02-15 DIAGNOSIS — E039 Hypothyroidism, unspecified: Secondary | ICD-10-CM | POA: Insufficient documentation

## 2021-02-15 DIAGNOSIS — R778 Other specified abnormalities of plasma proteins: Secondary | ICD-10-CM | POA: Insufficient documentation

## 2021-02-15 DIAGNOSIS — Z79899 Other long term (current) drug therapy: Secondary | ICD-10-CM | POA: Insufficient documentation

## 2021-02-15 DIAGNOSIS — Z7982 Long term (current) use of aspirin: Secondary | ICD-10-CM | POA: Diagnosis not present

## 2021-02-15 DIAGNOSIS — S5011XA Contusion of right forearm, initial encounter: Principal | ICD-10-CM | POA: Insufficient documentation

## 2021-02-15 DIAGNOSIS — Z91010 Allergy to peanuts: Secondary | ICD-10-CM | POA: Insufficient documentation

## 2021-02-15 DIAGNOSIS — Z7901 Long term (current) use of anticoagulants: Secondary | ICD-10-CM | POA: Insufficient documentation

## 2021-02-15 DIAGNOSIS — S59911A Unspecified injury of right forearm, initial encounter: Secondary | ICD-10-CM | POA: Diagnosis present

## 2021-02-15 DIAGNOSIS — R7989 Other specified abnormal findings of blood chemistry: Secondary | ICD-10-CM

## 2021-02-15 LAB — COMPREHENSIVE METABOLIC PANEL
ALT: 16 U/L (ref 0–44)
AST: 30 U/L (ref 15–41)
Albumin: 3.6 g/dL (ref 3.5–5.0)
Alkaline Phosphatase: 63 U/L (ref 38–126)
Anion gap: 15 (ref 5–15)
BUN: 28 mg/dL — ABNORMAL HIGH (ref 8–23)
CO2: 20 mmol/L — ABNORMAL LOW (ref 22–32)
Calcium: 9.9 mg/dL (ref 8.9–10.3)
Chloride: 99 mmol/L (ref 98–111)
Creatinine, Ser: 1.16 mg/dL — ABNORMAL HIGH (ref 0.44–1.00)
GFR, Estimated: 46 mL/min — ABNORMAL LOW (ref >60)
Glucose, Bld: 218 mg/dL — ABNORMAL HIGH (ref 70–99)
Potassium: 3.9 mmol/L (ref 3.5–5.1)
Sodium: 134 mmol/L — ABNORMAL LOW (ref 135–145)
Total Bilirubin: 0.9 mg/dL (ref 0.3–1.2)
Total Protein: 7.6 g/dL (ref 6.5–8.1)

## 2021-02-15 LAB — CBC WITH DIFFERENTIAL/PLATELET
Abs Immature Granulocytes: 0.1 10*3/uL — ABNORMAL HIGH (ref 0.00–0.07)
Basophils Absolute: 0.1 10*3/uL (ref 0.0–0.1)
Basophils Relative: 0 %
Eosinophils Absolute: 0 10*3/uL (ref 0.0–0.5)
Eosinophils Relative: 0 %
HCT: 38.6 % (ref 36.0–46.0)
Hemoglobin: 12.1 g/dL (ref 12.0–15.0)
Immature Granulocytes: 1 %
Lymphocytes Relative: 13 %
Lymphs Abs: 2.3 10*3/uL (ref 0.7–4.0)
MCH: 27.6 pg (ref 26.0–34.0)
MCHC: 31.3 g/dL (ref 30.0–36.0)
MCV: 88.1 fL (ref 80.0–100.0)
Monocytes Absolute: 1.1 10*3/uL — ABNORMAL HIGH (ref 0.1–1.0)
Monocytes Relative: 7 %
Neutro Abs: 13.6 10*3/uL — ABNORMAL HIGH (ref 1.7–7.7)
Neutrophils Relative %: 79 %
Platelets: 497 10*3/uL — ABNORMAL HIGH (ref 150–400)
RBC: 4.38 MIL/uL (ref 3.87–5.11)
RDW: 13.3 % (ref 11.5–15.5)
WBC: 17.2 10*3/uL — ABNORMAL HIGH (ref 4.0–10.5)
nRBC: 0 % (ref 0.0–0.2)

## 2021-02-15 LAB — URINALYSIS, ROUTINE W REFLEX MICROSCOPIC
Bilirubin Urine: NEGATIVE
Glucose, UA: 150 mg/dL — AB
Ketones, ur: 20 mg/dL — AB
Leukocytes,Ua: NEGATIVE
Nitrite: NEGATIVE
Protein, ur: >=300 mg/dL — AB
Specific Gravity, Urine: 1.022 (ref 1.005–1.030)
pH: 5 (ref 5.0–8.0)

## 2021-02-15 LAB — CBC
HCT: 34 % — ABNORMAL LOW (ref 36.0–46.0)
Hemoglobin: 10.8 g/dL — ABNORMAL LOW (ref 12.0–15.0)
MCH: 27.8 pg (ref 26.0–34.0)
MCHC: 31.8 g/dL (ref 30.0–36.0)
MCV: 87.4 fL (ref 80.0–100.0)
Platelets: 450 10*3/uL — ABNORMAL HIGH (ref 150–400)
RBC: 3.89 MIL/uL (ref 3.87–5.11)
RDW: 13.4 % (ref 11.5–15.5)
WBC: 13.4 10*3/uL — ABNORMAL HIGH (ref 4.0–10.5)
nRBC: 0 % (ref 0.0–0.2)

## 2021-02-15 LAB — TROPONIN I (HIGH SENSITIVITY)
Troponin I (High Sensitivity): 61 ng/L — ABNORMAL HIGH (ref <18)
Troponin I (High Sensitivity): 69 ng/L — ABNORMAL HIGH (ref <18)
Troponin I (High Sensitivity): 80 ng/L — ABNORMAL HIGH (ref <18)

## 2021-02-15 LAB — CBG MONITORING, ED: Glucose-Capillary: 182 mg/dL — ABNORMAL HIGH (ref 70–99)

## 2021-02-15 LAB — LACTIC ACID, PLASMA: Lactic Acid, Venous: 1.4 mmol/L (ref 0.5–1.9)

## 2021-02-15 LAB — CK: Total CK: 156 U/L (ref 38–234)

## 2021-02-15 MED ORDER — INSULIN ASPART 100 UNIT/ML IJ SOLN
0.0000 [IU] | Freq: Three times a day (TID) | INTRAMUSCULAR | Status: DC
  Administered 2021-02-16: 3 [IU] via SUBCUTANEOUS
  Administered 2021-02-16: 2 [IU] via SUBCUTANEOUS
  Administered 2021-02-16 – 2021-02-17 (×2): 1 [IU] via SUBCUTANEOUS
  Administered 2021-02-17 – 2021-02-18 (×4): 2 [IU] via SUBCUTANEOUS

## 2021-02-15 MED ORDER — SERTRALINE HCL 100 MG PO TABS
100.0000 mg | ORAL_TABLET | Freq: Every day | ORAL | Status: DC
  Administered 2021-02-16 – 2021-02-18 (×3): 100 mg via ORAL
  Filled 2021-02-15 (×3): qty 1

## 2021-02-15 MED ORDER — INSULIN ASPART 100 UNIT/ML IJ SOLN
0.0000 [IU] | Freq: Every day | INTRAMUSCULAR | Status: DC
  Administered 2021-02-16: 3 [IU] via SUBCUTANEOUS

## 2021-02-15 MED ORDER — ENOXAPARIN SODIUM 40 MG/0.4ML IJ SOSY
40.0000 mg | PREFILLED_SYRINGE | Freq: Every day | INTRAMUSCULAR | Status: DC
  Administered 2021-02-16 – 2021-02-18 (×3): 40 mg via SUBCUTANEOUS
  Filled 2021-02-15 (×3): qty 0.4

## 2021-02-15 MED ORDER — LOSARTAN POTASSIUM 50 MG PO TABS
50.0000 mg | ORAL_TABLET | Freq: Every day | ORAL | Status: DC
  Administered 2021-02-16 – 2021-02-17 (×2): 50 mg via ORAL
  Filled 2021-02-15 (×2): qty 1

## 2021-02-15 MED ORDER — ONDANSETRON HCL 4 MG/2ML IJ SOLN: 4.0000 mg | Freq: Four times a day (QID) | INTRAMUSCULAR | Status: DC | PRN

## 2021-02-15 MED ORDER — METFORMIN HCL 500 MG PO TABS: 1000.0000 mg | ORAL_TABLET | Freq: Two times a day (BID) | ORAL | Status: DC

## 2021-02-15 MED ORDER — MAGNESIUM OXIDE -MG SUPPLEMENT 400 (240 MG) MG PO TABS
400.0000 mg | ORAL_TABLET | Freq: Two times a day (BID) | ORAL | Status: DC
  Administered 2021-02-15 – 2021-02-18 (×6): 400 mg via ORAL
  Filled 2021-02-15 (×6): qty 1

## 2021-02-15 MED ORDER — METOPROLOL TARTRATE 25 MG PO TABS
25.0000 mg | ORAL_TABLET | Freq: Two times a day (BID) | ORAL | Status: DC
  Administered 2021-02-15: 25 mg via ORAL
  Filled 2021-02-15: qty 1

## 2021-02-15 MED ORDER — ASPIRIN EC 325 MG PO TBEC
325.0000 mg | DELAYED_RELEASE_TABLET | Freq: Every day | ORAL | Status: DC
  Administered 2021-02-16 – 2021-02-18 (×3): 325 mg via ORAL
  Filled 2021-02-15 (×3): qty 1

## 2021-02-15 MED ORDER — ASPIRIN 325 MG PO TABS
325.0000 mg | ORAL_TABLET | Freq: Once | ORAL | Status: AC
  Administered 2021-02-15: 325 mg via ORAL
  Filled 2021-02-15: qty 1

## 2021-02-15 MED ORDER — CLOPIDOGREL BISULFATE 75 MG PO TABS
75.0000 mg | ORAL_TABLET | Freq: Every evening | ORAL | Status: DC
  Administered 2021-02-15 – 2021-02-17 (×3): 75 mg via ORAL
  Filled 2021-02-15 (×3): qty 1

## 2021-02-15 MED ORDER — ACETAMINOPHEN 650 MG RE SUPP: 650.0000 mg | Freq: Four times a day (QID) | RECTAL | Status: DC | PRN

## 2021-02-15 MED ORDER — AMLODIPINE BESYLATE 5 MG PO TABS
5.0000 mg | ORAL_TABLET | Freq: Every evening | ORAL | Status: DC
  Administered 2021-02-15: 5 mg via ORAL
  Filled 2021-02-15: qty 1

## 2021-02-15 MED ORDER — CLONIDINE HCL 0.1 MG PO TABS
0.1000 mg | ORAL_TABLET | Freq: Two times a day (BID) | ORAL | Status: DC
  Administered 2021-02-15 – 2021-02-18 (×6): 0.1 mg via ORAL
  Filled 2021-02-15 (×6): qty 1

## 2021-02-15 MED ORDER — CLONIDINE HCL 0.1 MG PO TABS: 0.1000 mg | ORAL_TABLET | Freq: Once | ORAL | Status: DC

## 2021-02-15 MED ORDER — ACETAMINOPHEN 325 MG PO TABS
650.0000 mg | ORAL_TABLET | Freq: Four times a day (QID) | ORAL | Status: DC | PRN
  Administered 2021-02-16: 650 mg via ORAL
  Filled 2021-02-15 (×2): qty 2

## 2021-02-15 MED ORDER — ATORVASTATIN CALCIUM 10 MG PO TABS
20.0000 mg | ORAL_TABLET | Freq: Every evening | ORAL | Status: DC
  Administered 2021-02-15 – 2021-02-17 (×3): 20 mg via ORAL
  Filled 2021-02-15 (×3): qty 2

## 2021-02-15 MED ORDER — ONDANSETRON HCL 4 MG PO TABS: 4.0000 mg | ORAL_TABLET | Freq: Four times a day (QID) | ORAL | Status: DC | PRN

## 2021-02-15 MED ORDER — SODIUM CHLORIDE 0.9 % IV BOLUS
1000.0000 mL | Freq: Once | INTRAVENOUS | Status: AC
  Administered 2021-02-15: 1000 mL via INTRAVENOUS

## 2021-02-15 MED ORDER — LEVOTHYROXINE SODIUM 88 MCG PO TABS
88.0000 ug | ORAL_TABLET | Freq: Every day | ORAL | Status: DC
  Administered 2021-02-16 – 2021-02-18 (×2): 88 ug via ORAL
  Filled 2021-02-15 (×4): qty 1

## 2021-02-15 MED ORDER — LACTATED RINGERS IV SOLN: INTRAVENOUS | Status: DC

## 2021-02-15 MED ORDER — SENNOSIDES-DOCUSATE SODIUM 8.6-50 MG PO TABS: 1.0000 | ORAL_TABLET | Freq: Every evening | ORAL | Status: DC | PRN

## 2021-02-15 NOTE — H&P (Signed)
History and Physical    [name] RUE:[id number] DOB: Jun 16, 1932 DOA: 02/15/2021  PCP: Macy Mis, MD   Patient coming from: SNF  Chief Complaint: Syncope  HPI: Jo Larson is a 87 y.o. female with medical history significant for HTN, DMT2, hypothyroidism who presents from a SNF memory care unit by EMS after a fall. She was found on the floor near her bed and is not sure if she passed out. She is not able to provide any history secondary to her dementia.  Family is not here at this time.  Daughter was at the bedside earlier and gave history to the ER physician.  Patient speaks [place] but not able to provide any history he does not know what happened.  No complaints of any pain.  Work-up in the emergency room so far has been negative.  There is no report of any chest pain or pressure and no report of any fever, nausea, vomiting or diarrhea.  ED Course: Patient is hemodynamically stable.  In the emergency room she had elevated troponin level which intermittently increased when it was rechecked.  ER physician discussed with cardiology who will see patient in the morning.  CT of the head was negative for acute pathology.  Patient is at her baseline level according to her daughter.  Daughter reported to the ER physician that patient normally can identify the daughter but is otherwise confused and cannot provide any history and does not talk much.  Review of Systems:  Unable to systems secondary to dementia  Past Medical History:  Diagnosis Date   Abnormal chest x-ray    Angioedema    a. 05/2014 - felt to be 2/2 clindamycin therapy.   Anxiety    CAD (coronary artery disease)    a. 07/2014 NSTEMI/PCI: LM nl, LAD min irregs, LCX 90m (4.0x18 Xience DES), LPDA 90 (2mm vessel->med Rx), RCA nl, EF 60%.   Diabetes mellitus without complication (HCC)    Hyperlipemia    Hypertension    Hypothyroidism     Past Surgical History:  Procedure Laterality Date   INTUBATION NASOTRACHEAL N/A  04/03/2013   Procedure: FIBEROPTIC NASAL-TRACHEAL INTUBATION;  Surgeon: Christia Reading, MD;  Location: [facility] ORS;  Service: ENT;  Laterality: N/A;   IR ANGIO INTRA EXTRACRAN SEL COM CAROTID INNOMINATE BILAT MOD SED  01/03/2021   IR ANGIO VERTEBRAL SEL VERTEBRAL UNI R MOD SED  01/03/2021   IR INTRAVSC STENT CERV CAROTID W/EMB-PROT MOD SED INCL ANGIO  01/03/2021   IR US GUIDE VASC ACCESS RIGHT  01/03/2021   LEFT HEART CATHETERIZATION WITH CORONARY ANGIOGRAM N/A 07/26/2014   Procedure: LEFT HEART CATHETERIZATION WITH CORONARY ANGIOGRAM;  Surgeon: Runell Gess, MD;  Location: [facility] CATH LAB;  Service: Cardiovascular;  Laterality: N/A;   RADIOLOGY WITH ANESTHESIA Right 01/03/2021   Procedure: IR WITH ANESTHESIA - CAROTID STENT;  Surgeon: Baldemar Lenis, MD;  Location: [facility] OR;  Service: Radiology;  Laterality: Right;    Social History  reports that she has never smoked. She has never used smokeless tobacco. She reports that she does not drink alcohol and does not use drugs.  Allergies  Allergen Reactions   Clindamycin/Lincomycin Swelling and Other (See Comments)    Tongue swelling, angioedema   Peanut Butter Flavor Shortness Of Breath and Other (See Comments)    Swelling and airway closes   Peanut Oil Shortness Of Breath and Other (See Comments)    Swelling and airway closes    Losartan Swelling and Other (See Comments)  ER visit 07/2018    Family History  Problem Relation Age of Onset   Hypertension Father    Diabetes Father      Prior to Admission medications   Medication Sig Start Date End Date Taking? Authorizing Provider  acetaminophen (TYLENOL) 325 MG tablet Take 2 tablets (650 mg total) by mouth 4 (four) times daily -  before meals and at bedtime. 01/20/21  Yes Love, Evlyn Kanner, PA-C  amLODipine (NORVASC) 5 MG tablet Take 1 tablet (5 mg total) by mouth daily. Patient taking differently: Take 5 mg by mouth every evening. 01/20/21  Yes Love, Evlyn Kanner, PA-C  aspirin 325 MG EC  tablet Take 1 tablet (325 mg total) by mouth daily. 01/20/21  Yes Love, Evlyn Kanner, PA-C  atorvastatin (LIPITOR) 20 MG tablet Take 1 tablet (20 mg total) by mouth daily. Patient taking differently: Take 20 mg by mouth every evening. 01/20/21  Yes Love, Evlyn Kanner, PA-C  cloNIDine (CATAPRES) 0.1 MG tablet Take 1 tablet (0.1 mg total) by mouth 2 (two) times daily. 01/20/21  Yes Love, Evlyn Kanner, PA-C  clopidogrel (PLAVIX) 75 MG tablet Take 1 tablet (75 mg total) by mouth daily. Patient taking differently: Take 75 mg by mouth every evening. 01/20/21  Yes Love, Evlyn Kanner, PA-C  diclofenac Sodium (VOLTAREN) 1 % GEL Apply 2 g topically 4 (four) times daily. To left wrist 01/20/21  Yes Love, Evlyn Kanner, PA-C  glimepiride (AMARYL) 2 MG tablet Take 1 tablet (2 mg total) by mouth daily. 01/20/21  Yes Love, Evlyn Kanner, PA-C  levothyroxine (SYNTHROID) 88 MCG tablet Take 1 tablet (88 mcg total) by mouth daily. 01/20/21  Yes Love, Evlyn Kanner, PA-C  losartan (COZAAR) 50 MG tablet Take 1 tablet (50 mg total) by mouth daily with supper. 01/20/21  Yes Love, Evlyn Kanner, PA-C  magnesium oxide (MAG-OX) 400 (240 Mg) MG tablet Take 1 tablet (400 mg total) by mouth 2 (two) times daily. 01/20/21  Yes Love, Evlyn Kanner, PA-C  metFORMIN (GLUCOPHAGE) 1000 MG tablet Take 1 tablet (1,000 mg total) by mouth 2 (two) times daily with a meal. 01/20/21  Yes Love, Evlyn Kanner, PA-C  metoprolol tartrate (LOPRESSOR) 25 MG tablet Take 25 mg by mouth 2 (two) times daily. 12/07/19  Yes [provider]  pantoprazole (PROTONIX) 40 MG tablet Take 1 tablet (40 mg total) by mouth daily. 01/20/21  Yes Love, Evlyn Kanner, PA-C  sertraline (ZOLOFT) 100 MG tablet Take 1 tablet (100 mg total) by mouth daily. 01/20/21  Yes Jerene Pitch    Physical Exam: Vitals:   02/15/21 1632 02/15/21 1700 02/15/21 1956 02/15/21 2000  BP: (!) 155/97 (!) 195/81 (!) 127/51 (!) 148/59  Pulse: 88 84 77 73  Resp: 16 18 18 16   Temp: 98.5 F (36.9 C)     TempSrc: Oral     SpO2: 95% 93% 95% 93%     Constitutional: NAD, calm, comfortable Vitals:   02/15/21 1632 02/15/21 1700 02/15/21 1956 02/15/21 2000  BP: (!) 155/97 (!) 195/81 (!) 127/51 (!) 148/59  Pulse: 88 84 77 73  Resp: 16 18 18 16   Temp: 98.5 F (36.9 C)     TempSrc: Oral     SpO2: 95% 93% 95% 93%   General: WDWN, Alert  Eyes: EOMI, PERRL, conjunctivae normal.  Sclera nonicteric HENT:  [place]/AT, external ears normal.  Nares patent without epistasis.  Mucous membranes are moist.  Neck: Soft, normal range of motion, supple, no masses, no thyromegaly.  Trachea midline  Respiratory: clear to auscultation bilaterally, no wheezing, no crackles. Normal respiratory effort. No accessory muscle use.  Cardiovascular: Regular rate and rhythm, no murmurs / rubs / gallops. No extremity edema. 2+ pedal pulses.  Abdomen: Soft, no tenderness, nondistended, no rebound or guarding.  No masses palpated. Bowel sounds normoactive Musculoskeletal: FROM. No cyanosis. No joint deformity upper and lower extremities. Normal muscle tone.  Skin: Warm, dry, intact no rashes, lesions, ulcers. No induration Neurologic: CN 2-12 grossly intact. Normal speech.  Sensation intact to touch. patella DTR +1 bilaterally. Strength 5/5 in all extremities.   Psychiatric: Normal judgment and insight.  Normal mood.    Labs on Admission: I have personally reviewed following labs and imaging studies  CBC: Recent Labs  Lab 02/15/21 1656  WBC 17.2*  NEUTROABS 13.6*  HGB 12.1  HCT 38.6  MCV 88.1  PLT 497*    Basic Metabolic Panel: Recent Labs  Lab 02/15/21 1656  NA 134*  K 3.9  CL 99  CO2 20*  GLUCOSE 218*  BUN 28*  CREATININE 1.16*  CALCIUM 9.9    GFR: Estimated Creatinine Clearance: 30.8 mL/min (A) (by C-G formula based on SCr of 1.16 mg/dL (H)).  Liver Function Tests: Recent Labs  Lab 02/15/21 1656  AST 30  ALT 16  ALKPHOS 63  BILITOT 0.9  PROT 7.6  ALBUMIN 3.6    Urine analysis:    Component Value Date/Time   COLORURINE YELLOW  02/15/2021 1816   APPEARANCEUR CLEAR 02/15/2021 1816   LABSPEC 1.022 02/15/2021 1816   PHURINE 5.0 02/15/2021 1816   GLUCOSEU 150 (A) 02/15/2021 1816   HGBUR SMALL (A) 02/15/2021 1816   HGBUR negative 05/08/2010 0811   BILIRUBINUR NEGATIVE 02/15/2021 1816   KETONESUR 20 (A) 02/15/2021 1816   PROTEINUR >=300 (A) 02/15/2021 1816   UROBILINOGEN 0.2 05/08/2010 0811   NITRITE NEGATIVE 02/15/2021 1816   LEUKOCYTESUR NEGATIVE 02/15/2021 1816    Radiological Exams on Admission: DG Chest 1 View  Result Date: 02/15/2021 CLINICAL DATA:  Fall EXAM: CHEST  1 VIEW COMPARISON:  02/08/2021 FINDINGS: The heart size and mediastinal contours are within normal limits. Both lungs are clear. The visualized skeletal structures are unremarkable. IMPRESSION: No active disease. Electronically Signed   By: Marlan Palau M.D.   On: 02/15/2021 17:36   DG Pelvis 1-2 Views  Result Date: 02/15/2021 CLINICAL DATA:  Fall EXAM: PELVIS - 1-2 VIEW COMPARISON:  None. FINDINGS: Negative for pelvic fracture. Calcification in the iliac and femoral arteries bilaterally. IMPRESSION: Negative for pelvic fracture. Electronically Signed   By: Marlan Palau M.D.   On: 02/15/2021 17:37   DG Forearm Right  Result Date: 02/15/2021 CLINICAL DATA:  Fall EXAM: RIGHT FOREARM - 2 VIEW COMPARISON:  None. FINDINGS: Negative for acute fracture. No significant arthropathy. [place]icle versus chronic fracture of the tip of the ulnar styloid. IMPRESSION: Negative for acute fracture. Electronically Signed   By: Marlan Palau M.D.   On: 02/15/2021 17:38   CT Head Wo Contrast  Result Date: 02/15/2021 CLINICAL DATA:  Fall.  Facial trauma EXAM: CT HEAD WITHOUT CONTRAST CT CERVICAL SPINE WITHOUT CONTRAST TECHNIQUE: Multidetector CT imaging of the head and cervical spine was performed following the standard protocol without intravenous contrast. Multiplanar CT image reconstructions of the cervical spine were also generated. COMPARISON:  CT head  02/08/2021 FINDINGS: CT HEAD FINDINGS Brain: Mild atrophy. Mild to moderate chronic white matter changes. Negative for acute infarct, hemorrhage, mass Vascular: Negative for hyperdense vessel Skull: Negative Sinuses/Orbits: Mild mucosal edema  paranasal sinuses. Negative orbit Other: None CT CERVICAL SPINE FINDINGS Alignment: Mild anterolisthesis C3-4. Skull base and vertebrae: Negative for fracture Soft tissues and spinal canal: Atherosclerotic disease. Right carotid stent noted. Disc levels: Disc degeneration and spurring throughout the cervical spine most prominent C4-5 and C5-6. Upper chest: Lung apices clear bilaterally. Other: None IMPRESSION: 1. No acute intracranial abnormality 2. Negative for cervical spine fracture. Electronically Signed   By: Marlan Palau M.D.   On: 02/15/2021 19:27   CT Cervical Spine Wo Contrast  Result Date: 02/15/2021 CLINICAL DATA:  Fall.  Facial trauma EXAM: CT HEAD WITHOUT CONTRAST CT CERVICAL SPINE WITHOUT CONTRAST TECHNIQUE: Multidetector CT imaging of the head and cervical spine was performed following the standard protocol without intravenous contrast. Multiplanar CT image reconstructions of the cervical spine were also generated. COMPARISON:  CT head 02/08/2021 FINDINGS: CT HEAD FINDINGS Brain: Mild atrophy. Mild to moderate chronic white matter changes. Negative for acute infarct, hemorrhage, mass Vascular: Negative for hyperdense vessel Skull: Negative Sinuses/Orbits: Mild mucosal edema paranasal sinuses. Negative orbit Other: None CT CERVICAL SPINE FINDINGS Alignment: Mild anterolisthesis C3-4. Skull base and vertebrae: Negative for fracture Soft tissues and spinal canal: Atherosclerotic disease. Right carotid stent noted. Disc levels: Disc degeneration and spurring throughout the cervical spine most prominent C4-5 and C5-6. Upper chest: Lung apices clear bilaterally. Other: None IMPRESSION: 1. No acute intracranial abnormality 2. Negative for cervical spine fracture.  Electronically Signed   By: Marlan Palau M.D.   On: 02/15/2021 19:27    EKG: Independently reviewed.  EKG shows normal sinus rhythm with LVH.  No acute ST elevation or depression.  QTc 421  Assessment/Plan Principal Problem:   Syncope Ms. Thoma is placed on Medical Telemetry floor for observation.  She is not able to provide any history herself and no family present.  CT head was negative.  Check orthostatic Blood pressure upon arrival to floor.   Active Problems:   Elevated troponin level Check serial troponin levels. Cardiology will see pt in am.     Type 2 diabetes mellitus without complication, without long-term current use of insulin  Continue metformin. Hold amaryl while in hospital. Monitor blood sugar with meals and bedtime and provide insulin coverage as needed for hyperglycemia.  Had HgbA1c within last 2 months so not repeated.    Essential hypertension Continue home medications. Monitor blood pressure.     Leukocytosis No signs of infection. CXR and UA negative. Recheck CBC in am    Dementia Chronic.      DVT prophylaxis: Lovenox for DVT prophylaxis.   Code Status:   Full code  Family Communication:  No family at bedside.  Disposition Plan:   Patient is from:  SNF  Anticipated DC to:  SNF  Anticipated DC date:  Anticipate less than 2 midnight stay  Consults called:  Cardiology consulted by ER physician and will see in am  Admission status:  Observation   Claudean Severance Nosson Wender MD [facility]  How to contact the [facility] Attending or Consulting provider 7A - 7P or covering provider during after hours 7P -7A, for this patient?   Check the care team in [facility] and look for a) attending/consulting [facility] provider listed and b) the [facility] team listed Log into [email or web address] and use [place]'s universal password to access. If you do not have the password, please contact the hospital operator. Locate the [facility] provider you are looking for under [facility] and  page to a number that you can be directly reached.  If you still have difficulty reaching the provider, please page the [facility] (Director on Call) for the Hospitalists listed on amion for assistance.  02/15/2021, 10:48 PM

## 2021-02-15 NOTE — ED Provider Notes (Signed)
[facility] EMERGENCY DEPARTMENT Provider Note   CSN: [id number] Arrival date & time: 02/15/21  1621     History Chief Complaint  Patient presents with   Fall    Jo Larson is a 87 y.o. female history of CAD, dementia, diabetes here presenting with fall.  Patient is from memory care unit.  She was found next to her bed. Patient is demented and unable to give much history.  Her daughter is at bedside.  She states that she is back to baseline now.  Patient speaks [place].  And she is apparently able to recognize her daughter and this is baseline  The history is provided by the patient, the nursing home, a relative and the EMS personnel.      Past Medical History:  Diagnosis Date   Abnormal chest x-ray    Angioedema    a. 05/2014 - felt to be 2/2 clindamycin therapy.   Anxiety    CAD (coronary artery disease)    a. 07/2014 NSTEMI/PCI: LM nl, LAD min irregs, LCX 75m (4.0x18 Xience DES), LPDA 90 (2mm vessel->med Rx), RCA nl, EF 60%.   Diabetes mellitus without complication (HCC)    Hyperlipemia    Hypertension    Hypothyroidism     Patient Active Problem List   Diagnosis Date Noted   Leucocytosis 01/20/2021   Hypomagnesemia 01/20/2021   Left wrist pain 01/20/2021   Stroke (cerebrum) (HCC) 01/05/2021   CVA (cerebral vascular accident) (HCC) 12/30/2020   Dyslipidemia 10/14/2016   Coronary artery disease involving native coronary artery of native heart without angina pectoris 10/14/2016   Chest pain, atypical 04/27/2015   CAD S/P percutaneous coronary angioplasty 07/27/2014   Abnormal chest x-ray 07/27/2014   History of non-ST elevation myocardial infarction (NSTEMI) 07/24/2014   Hypokalemia 07/24/2014   Angioedema 04/03/2013   Acute respiratory failure (HCC) 04/03/2013   POSITIVE PPD 05/17/2010   URINALYSIS, ABNORMAL 04/24/2010   SYSTOLIC MURMUR 09/13/2009   Hypothyroidism 01/05/2008   Hyperlipidemia 03/03/2007   ANXIETY DISORDER, GENERALIZED  03/03/2007   INSOMNIA, PERSISTENT 03/03/2007   OSTEOARTHRITIS 03/03/2007   DEFICIENCY, VITAMIN D NOS 08/05/2006   Type 2 diabetes mellitus without complication, without long-term current use of insulin (HCC) 06/04/1997   Essential hypertension 07/23/1981    Past Surgical History:  Procedure Laterality Date   INTUBATION NASOTRACHEAL N/A 04/03/2013   Procedure: FIBEROPTIC NASAL-TRACHEAL INTUBATION;  Surgeon: Christia Reading, MD;  Location: [facility];  Service: ENT;  Laterality: N/A;   IR ANGIO INTRA EXTRACRAN SEL COM CAROTID INNOMINATE BILAT MOD SED  01/03/2021   IR ANGIO VERTEBRAL SEL VERTEBRAL UNI R MOD SED  01/03/2021   IR INTRAVSC STENT CERV CAROTID W/EMB-PROT MOD SED INCL ANGIO  01/03/2021   IR US GUIDE VASC ACCESS RIGHT  01/03/2021   LEFT HEART CATHETERIZATION WITH CORONARY ANGIOGRAM N/A 07/26/2014   Procedure: LEFT HEART CATHETERIZATION WITH CORONARY ANGIOGRAM;  Surgeon: Runell Gess, MD;  Location: [facility] CATH LAB;  Service: Cardiovascular;  Laterality: N/A;   RADIOLOGY WITH ANESTHESIA Right 01/03/2021   Procedure: IR WITH ANESTHESIA - CAROTID STENT;  Surgeon: Baldemar Lenis, MD;  Location: [facility] OR;  Service: Radiology;  Laterality: Right;     OB History   No obstetric history on file.     Family History  Problem Relation Age of Onset   Hypertension Father    Diabetes Father     Social History   Tobacco Use   Smoking status: Never   Smokeless tobacco: Never  Vaping  Use   Vaping Use: Never used  Substance Use Topics   Alcohol use: No   Drug use: No    Home Medications Prior to Admission medications   Medication Sig Start Date End Date Taking? Authorizing Provider  acetaminophen (TYLENOL) 325 MG tablet Take 2 tablets (650 mg total) by mouth 4 (four) times daily -  before meals and at bedtime. 01/20/21  Yes Love, Evlyn Kanner, PA-C  amLODipine (NORVASC) 5 MG tablet Take 1 tablet (5 mg total) by mouth daily. Patient taking differently: Take 5 mg by mouth every evening.  01/20/21  Yes Love, Evlyn Kanner, PA-C  aspirin 325 MG EC tablet Take 1 tablet (325 mg total) by mouth daily. 01/20/21  Yes Love, Evlyn Kanner, PA-C  atorvastatin (LIPITOR) 20 MG tablet Take 1 tablet (20 mg total) by mouth daily. Patient taking differently: Take 20 mg by mouth every evening. 01/20/21  Yes Love, Evlyn Kanner, PA-C  cloNIDine (CATAPRES) 0.1 MG tablet Take 1 tablet (0.1 mg total) by mouth 2 (two) times daily. 01/20/21  Yes Love, Evlyn Kanner, PA-C  clopidogrel (PLAVIX) 75 MG tablet Take 1 tablet (75 mg total) by mouth daily. Patient taking differently: Take 75 mg by mouth every evening. 01/20/21  Yes Love, Evlyn Kanner, PA-C  diclofenac Sodium (VOLTAREN) 1 % GEL Apply 2 g topically 4 (four) times daily. To left wrist 01/20/21  Yes Love, Evlyn Kanner, PA-C  glimepiride (AMARYL) 2 MG tablet Take 1 tablet (2 mg total) by mouth daily. 01/20/21  Yes Love, Evlyn Kanner, PA-C  levothyroxine (SYNTHROID) 88 MCG tablet Take 1 tablet (88 mcg total) by mouth daily. 01/20/21  Yes Love, Evlyn Kanner, PA-C  losartan (COZAAR) 50 MG tablet Take 1 tablet (50 mg total) by mouth daily with supper. 01/20/21  Yes Love, Evlyn Kanner, PA-C  magnesium oxide (MAG-OX) 400 (240 Mg) MG tablet Take 1 tablet (400 mg total) by mouth 2 (two) times daily. 01/20/21  Yes Love, Evlyn Kanner, PA-C  metFORMIN (GLUCOPHAGE) 1000 MG tablet Take 1 tablet (1,000 mg total) by mouth 2 (two) times daily with a meal. 01/20/21  Yes Love, Evlyn Kanner, PA-C  metoprolol tartrate (LOPRESSOR) 25 MG tablet Take 25 mg by mouth 2 (two) times daily. 12/07/19  Yes [provider]  pantoprazole (PROTONIX) 40 MG tablet Take 1 tablet (40 mg total) by mouth daily. 01/20/21  Yes Love, Evlyn Kanner, PA-C  sertraline (ZOLOFT) 100 MG tablet Take 1 tablet (100 mg total) by mouth daily. 01/20/21  Yes Love, Evlyn Kanner, PA-C    Allergies    Clindamycin/lincomycin, Peanut butter flavor, Peanut oil, and Losartan  Review of Systems   Review of Systems  Psychiatric/Behavioral:  Positive for confusion.   All other  systems reviewed and are negative.  Physical Exam Updated Vital Signs BP (!) 148/59   Pulse 73   Temp 98.5 F (36.9 C) (Oral)   Resp 16   SpO2 93%   Physical Exam Vitals and nursing note reviewed.  Constitutional:      Comments: No obvious scalp hematoma, demented  HENT:     Head:     Comments: No obvious hematoma    Nose: Nose normal.     Mouth/Throat:     Mouth: Mucous membranes are moist.  Eyes:     Extraocular Movements: Extraocular movements intact.     Pupils: Pupils are equal, round, and reactive to light.  Cardiovascular:     Rate and Rhythm: Normal rate and regular rhythm.  Pulses: Normal pulses.     Heart sounds: Normal heart sounds.  Pulmonary:     Effort: Pulmonary effort is normal.     Breath sounds: Normal breath sounds.     Comments: Bruising the left scapula Abdominal:     General: Abdomen is flat.     Palpations: Abdomen is soft.  Musculoskeletal:     Cervical back: Normal range of motion.     Comments: Bruising on the right forearm.  No obvious spinal tenderness.  Normal range of motion bilateral hips  Skin:    General: Skin is warm.     Capillary Refill: Capillary refill takes less than 2 seconds.  Neurological:     Comments: Demented but moving all extremities.  Able to converse and [place] with daughter  Psychiatric:     Comments: Unable     ED Results / Procedures / Treatments   Labs (all labs ordered are listed, but only abnormal results are displayed) Labs Reviewed  CBC WITH DIFFERENTIAL/PLATELET - Abnormal; Notable for the following components:      Result Value   WBC 17.2 (*)    Platelets 497 (*)    Neutro Abs 13.6 (*)    Monocytes Absolute 1.1 (*)    Abs Immature Granulocytes 0.10 (*)    All other components within normal limits  COMPREHENSIVE METABOLIC PANEL - Abnormal; Notable for the following components:   Sodium 134 (*)    CO2 20 (*)    Glucose, Bld 218 (*)    BUN 28 (*)    Creatinine, Ser 1.16 (*)    GFR, Estimated  46 (*)    All other components within normal limits  URINALYSIS, ROUTINE W REFLEX MICROSCOPIC - Abnormal; Notable for the following components:   Glucose, UA 150 (*)    Hgb urine dipstick SMALL (*)    Ketones, ur 20 (*)    Protein, ur >=300 (*)    Bacteria, UA RARE (*)    All other components within normal limits  TROPONIN I (HIGH SENSITIVITY) - Abnormal; Notable for the following components:   Troponin I (High Sensitivity) 61 (*)    All other components within normal limits  TROPONIN I (HIGH SENSITIVITY) - Abnormal; Notable for the following components:   Troponin I (High Sensitivity) 69 (*)    All other components within normal limits  TROPONIN I (HIGH SENSITIVITY) - Abnormal; Notable for the following components:   Troponin I (High Sensitivity) 80 (*)    All other components within normal limits  URINE CULTURE  CULTURE, BLOOD (ROUTINE X 2)  CULTURE, BLOOD (ROUTINE X 2)  CK  LACTIC ACID, PLASMA    EKG EKG Interpretation  Date/Time:  Wednesday February 15 2021 16:42:03 EDT Ventricular Rate:  87 PR Interval:  141 QRS Duration: 86 QT Interval:  350 QTC Calculation: 421 R Axis:   -8 Text Interpretation: Sinus rhythm Probable LVH with secondary repol abnrm No significant change since last tracing Confirmed by Richardean CanalYao, Takeia Ciaravino H [phone]([id number]) on 02/15/2021 4:56:26 PM  Radiology DG Chest 1 View  Result Date: 02/15/2021 CLINICAL DATA:  Fall EXAM: CHEST  1 VIEW COMPARISON:  02/08/2021 FINDINGS: The heart size and mediastinal contours are within normal limits. Both lungs are clear. The visualized skeletal structures are unremarkable. IMPRESSION: No active disease. Electronically Signed   By: Marlan Palauharles  Clark M.D.   On: 02/15/2021 17:36   DG Pelvis 1-2 Views  Result Date: 02/15/2021 CLINICAL DATA:  Fall EXAM: PELVIS - 1-2 VIEW COMPARISON:  None. FINDINGS:  Negative for pelvic fracture. Calcification in the iliac and femoral arteries bilaterally. IMPRESSION: Negative for pelvic fracture. Electronically  Signed   By: Marlan Palau M.D.   On: 02/15/2021 17:37   DG Forearm Right  Result Date: 02/15/2021 CLINICAL DATA:  Fall EXAM: RIGHT FOREARM - 2 VIEW COMPARISON:  None. FINDINGS: Negative for acute fracture. No significant arthropathy. Ossicle versus chronic fracture of the tip of the ulnar styloid. IMPRESSION: Negative for acute fracture. Electronically Signed   By: Marlan Palau M.D.   On: 02/15/2021 17:38   CT Head Wo Contrast  Result Date: 02/15/2021 CLINICAL DATA:  Fall.  Facial trauma EXAM: CT HEAD WITHOUT CONTRAST CT CERVICAL SPINE WITHOUT CONTRAST TECHNIQUE: Multidetector CT imaging of the head and cervical spine was performed following the standard protocol without intravenous contrast. Multiplanar CT image reconstructions of the cervical spine were also generated. COMPARISON:  CT head 02/08/2021 FINDINGS: CT HEAD FINDINGS Brain: Mild atrophy. Mild to moderate chronic white matter changes. Negative for acute infarct, hemorrhage, mass Vascular: Negative for hyperdense vessel Skull: Negative Sinuses/Orbits: Mild mucosal edema paranasal sinuses. Negative orbit Other: None CT CERVICAL SPINE FINDINGS Alignment: Mild anterolisthesis C3-4. Skull base and vertebrae: Negative for fracture Soft tissues and spinal canal: Atherosclerotic disease. Right carotid stent noted. Disc levels: Disc degeneration and spurring throughout the cervical spine most prominent C4-5 and C5-6. Upper chest: Lung apices clear bilaterally. Other: None IMPRESSION: 1. No acute intracranial abnormality 2. Negative for cervical spine fracture. Electronically Signed   By: Marlan Palau M.D.   On: 02/15/2021 19:27   CT Cervical Spine Wo Contrast  Result Date: 02/15/2021 CLINICAL DATA:  Fall.  Facial trauma EXAM: CT HEAD WITHOUT CONTRAST CT CERVICAL SPINE WITHOUT CONTRAST TECHNIQUE: Multidetector CT imaging of the head and cervical spine was performed following the standard protocol without intravenous contrast. Multiplanar CT image  reconstructions of the cervical spine were also generated. COMPARISON:  CT head 02/08/2021 FINDINGS: CT HEAD FINDINGS Brain: Mild atrophy. Mild to moderate chronic white matter changes. Negative for acute infarct, hemorrhage, mass Vascular: Negative for hyperdense vessel Skull: Negative Sinuses/Orbits: Mild mucosal edema paranasal sinuses. Negative orbit Other: None CT CERVICAL SPINE FINDINGS Alignment: Mild anterolisthesis C3-4. Skull base and vertebrae: Negative for fracture Soft tissues and spinal canal: Atherosclerotic disease. Right carotid stent noted. Disc levels: Disc degeneration and spurring throughout the cervical spine most prominent C4-5 and C5-6. Upper chest: Lung apices clear bilaterally. Other: None IMPRESSION: 1. No acute intracranial abnormality 2. Negative for cervical spine fracture. Electronically Signed   By: Marlan Palau M.D.   On: 02/15/2021 19:27    Procedures Procedures   Medications Ordered in ED Medications  aspirin tablet 325 mg (has no administration in time range)  sodium chloride 0.9 % bolus 1,000 mL (0 mLs Intravenous Stopped 02/15/21 1824)    ED Course  I have reviewed the triage vital signs and the nursing notes.  Pertinent labs & imaging results that were available during my care of the patient were reviewed by me and considered in my medical decision making (see chart for details).    MDM Rules/Calculators/A&P                          Sherilynn Monterosso is a 87 y.o. female here is presenting with fall.  Patient is back to baseline now.  She is demented from a memory care unit. Will get CT head/neck, xrays, labs.   9:47 PM Patient's white blood cell count was  17.  However her lactate is normal.  Her urinalysis and chest x-ray was normal.  Initial troponin was 61 and then went up to 69 and now up to 80.  Patient has no chest pain but patient may have passed out. Patient also is demented and able to give reliable history.  I consulted cardiology and talked to Dr.  Daphine Deutscher from cardiology.  She states that she does not recommend any cardiac intervention right now.  Does recommend trending troponin.  If troponin becomes significantly elevated then cardiology can consult.  Other than that she can be managed medically.  Ordered aspirin.  Patient has no chest pain.  Will admit for possible syncope with elevated troponin    Final Clinical Impression(s) / ED Diagnoses Final diagnoses:  None    Rx / DC Orders ED Discharge Orders     None        Charlynne Pander, MD 02/15/21 2148

## 2021-02-15 NOTE — ED Notes (Signed)
Patient transported to CT 

## 2021-02-15 NOTE — ED Triage Notes (Signed)
Pt via EMS from [facility] after being found on the floor beside her bed. No obvious injury or LOC. Facility also tells EMS that they are "unable to provide one on one care for her so she needs to be in the hospital." Pt speaks only [place]. Endorses L arm pain via a language line used by EMS.

## 2021-02-16 DIAGNOSIS — S5011XA Contusion of right forearm, initial encounter: Secondary | ICD-10-CM | POA: Diagnosis not present

## 2021-02-16 DIAGNOSIS — R55 Syncope and collapse: Secondary | ICD-10-CM | POA: Diagnosis not present

## 2021-02-16 LAB — BASIC METABOLIC PANEL
Anion gap: 11 (ref 5–15)
BUN: 22 mg/dL (ref 8–23)
CO2: 22 mmol/L (ref 22–32)
Calcium: 9.4 mg/dL (ref 8.9–10.3)
Chloride: 101 mmol/L (ref 98–111)
Creatinine, Ser: 0.82 mg/dL (ref 0.44–1.00)
GFR, Estimated: >60 mL/min (ref >60)
Glucose, Bld: 158 mg/dL — ABNORMAL HIGH (ref 70–99)
Potassium: 3.4 mmol/L — ABNORMAL LOW (ref 3.5–5.1)
Sodium: 134 mmol/L — ABNORMAL LOW (ref 135–145)

## 2021-02-16 LAB — GLUCOSE, CAPILLARY
Glucose-Capillary: 182 mg/dL — ABNORMAL HIGH (ref 70–99)
Glucose-Capillary: 205 mg/dL — ABNORMAL HIGH (ref 70–99)
Glucose-Capillary: 149 mg/dL — ABNORMAL HIGH (ref 70–99)
Glucose-Capillary: 207 mg/dL — ABNORMAL HIGH (ref 70–99)

## 2021-02-16 LAB — MRSA NEXT GEN BY PCR, NASAL: MRSA by PCR Next Gen: NOT DETECTED

## 2021-02-16 LAB — CBC
HCT: 34.4 % — ABNORMAL LOW (ref 36.0–46.0)
Hemoglobin: 10.8 g/dL — ABNORMAL LOW (ref 12.0–15.0)
MCH: 27.8 pg (ref 26.0–34.0)
MCHC: 31.4 g/dL (ref 30.0–36.0)
MCV: 88.7 fL (ref 80.0–100.0)
Platelets: 461 10*3/uL — ABNORMAL HIGH (ref 150–400)
RBC: 3.88 MIL/uL (ref 3.87–5.11)
RDW: 13.5 % (ref 11.5–15.5)
WBC: 13.7 10*3/uL — ABNORMAL HIGH (ref 4.0–10.5)
nRBC: 0 % (ref 0.0–0.2)

## 2021-02-16 LAB — TROPONIN I (HIGH SENSITIVITY)
Troponin I (High Sensitivity): 77 ng/L — ABNORMAL HIGH (ref <18)
Troponin I (High Sensitivity): 66 ng/L — ABNORMAL HIGH (ref <18)
Troponin I (High Sensitivity): 55 ng/L — ABNORMAL HIGH (ref <18)

## 2021-02-16 LAB — SARS CORONAVIRUS 2 (TAT 6-24 HRS): SARS Coronavirus 2: NEGATIVE

## 2021-02-16 LAB — CREATININE, SERUM
Creatinine, Ser: 0.9 mg/dL (ref 0.44–1.00)
GFR, Estimated: >60 mL/min (ref >60)

## 2021-02-16 MED ORDER — AMLODIPINE BESYLATE 10 MG PO TABS
10.0000 mg | ORAL_TABLET | Freq: Every evening | ORAL | Status: DC
  Administered 2021-02-17: 10 mg via ORAL
  Filled 2021-02-16: qty 1

## 2021-02-16 MED ORDER — AMLODIPINE BESYLATE 5 MG PO TABS: 5.0000 mg | ORAL_TABLET | Freq: Every day | ORAL | Status: DC

## 2021-02-16 MED ORDER — TAMSULOSIN HCL 0.4 MG PO CAPS
0.4000 mg | ORAL_CAPSULE | Freq: Every day | ORAL | Status: DC
  Administered 2021-02-16 – 2021-02-18 (×3): 0.4 mg via ORAL
  Filled 2021-02-16 (×3): qty 1

## 2021-02-16 MED ORDER — POTASSIUM CHLORIDE CRYS ER 20 MEQ PO TBCR
40.0000 meq | EXTENDED_RELEASE_TABLET | Freq: Once | ORAL | Status: AC
  Administered 2021-02-16: 40 meq via ORAL
  Filled 2021-02-16: qty 2

## 2021-02-16 MED ORDER — METOPROLOL TARTRATE 12.5 MG HALF TABLET
12.5000 mg | ORAL_TABLET | Freq: Two times a day (BID) | ORAL | Status: DC
  Administered 2021-02-16 – 2021-02-18 (×5): 12.5 mg via ORAL
  Filled 2021-02-16 (×5): qty 1

## 2021-02-16 MED ORDER — AMLODIPINE BESYLATE 5 MG PO TABS
5.0000 mg | ORAL_TABLET | Freq: Once | ORAL | Status: AC
  Administered 2021-02-16: 5 mg via ORAL
  Filled 2021-02-16: qty 1

## 2021-02-16 MED ORDER — CEPHALEXIN 500 MG PO CAPS: 500.0000 mg | ORAL_CAPSULE | Freq: Three times a day (TID) | ORAL | 0 refills | Status: AC

## 2021-02-16 MED ORDER — SODIUM CHLORIDE 0.9 % IV SOLN
1.0000 g | INTRAVENOUS | Status: DC
  Administered 2021-02-16 – 2021-02-18 (×3): 1 g via INTRAVENOUS
  Filled 2021-02-16 (×3): qty 10

## 2021-02-16 MED ORDER — AMLODIPINE BESYLATE 10 MG PO TABS: 10.0000 mg | ORAL_TABLET | Freq: Every evening | ORAL | 0 refills | Status: AC

## 2021-02-16 MED ORDER — METOPROLOL TARTRATE 25 MG PO TABS: 12.5000 mg | ORAL_TABLET | Freq: Two times a day (BID) | ORAL | 0 refills | Status: DC

## 2021-02-16 MED ORDER — FLUCONAZOLE 100 MG PO TABS
100.0000 mg | ORAL_TABLET | Freq: Every day | ORAL | Status: DC
  Administered 2021-02-16 – 2021-02-18 (×3): 100 mg via ORAL
  Filled 2021-02-16 (×3): qty 1

## 2021-02-16 NOTE — TOC Initial Note (Addendum)
Transition of Care [facility]) - Initial/Assessment Note    Patient Details  Name: Jo Larson MRN: [id number] Date of Birth: Apr 16, 1932  Transition of Care [facility]) CM/SW Contact:    Lynett Grimes Phone Number: 02/16/2021, 3:29 PM  Clinical Narrative:                 CSW spoke with MD about pt returning back to [place] bc pt is medically ready to DC. CSW contacted [facility] to confirm pt DC, Malena Peer asked if CSW could send over information for admin to look into before pt DC. Malena Peer received pt DC summary, will follow up with CSW after he can return pt return.  [name] will [facility] pt [facility] but will need a new auth, csw will start new auth after PT/OT has seen pt.  CSW will continue to follow.    Expected Discharge Plan: Skilled Nursing Facility Barriers to Discharge: Barriers Resolved   Patient Goals and CMS Choice Patient states their goals for this hospitalization and ongoing recovery are:: Return to facility CMS Medicare.gov Compare Post Acute Care list provided to:: Patient Represenative (must comment) Choice offered to / list presented to : Adult Children  Expected Discharge Plan and Services Expected Discharge Plan: Skilled Nursing Facility In-house Referral: Clinical Social Work   Post Acute Care Choice: Skilled Nursing Facility Living arrangements for the past 2 months: Skilled Nursing Facility, Single Family Home Expected Discharge Date: 02/16/21                                    Prior Living Arrangements/Services Living arrangements for the past 2 months: Skilled Nursing Facility, Single Family Home Lives with:: Adult Children, Facility Resident Patient language and need for interpreter reviewed:: Yes Do you feel safe going back to the place where you live?: Yes      Need for Family Participation in Patient Care: Yes (Comment) Care giver support system in place?: Yes (comment)   Criminal Activity/Legal Involvement Pertinent to Current  Situation/Hospitalization: No - Comment as needed  Activities of Daily Living      Permission Sought/Granted Permission sought to share information with : Facility Medical sales representative Permission granted to share information with : Yes, Verbal Permission Granted  Share Information with NAME: Farkas,Radojka Relative     [phone] Daughter-in-law  Permission granted to share info w AGENCY: [name]  Permission granted to share info w Relationship: Peaster,Radojka Relative     [phone] Daughter-in-law  Permission granted to share info w Contact Information: Fifield,Radojka Relative     [phone] Daughter-in-law  Emotional Assessment Appearance:: Appears stated age Attitude/Demeanor/Rapport: Engaged Affect (typically observed): Other (comment), Quiet Orientation: : Oriented to Self, Oriented to Place Alcohol / Substance Use: Not Applicable Psych Involvement: No (comment)  Admission diagnosis:  Syncope [R55] Elevated troponin [R77.8] Syncope, unspecified syncope type [R55] Patient Active Problem List   Diagnosis Date Noted   Syncope 02/15/2021   Dementia (HCC) 02/15/2021   Elevated troponin level 02/15/2021   Leucocytosis 01/20/2021   Hypomagnesemia 01/20/2021   Left wrist pain 01/20/2021   Stroke (cerebrum) (HCC) 01/05/2021   CVA (cerebral vascular accident) (HCC) 12/30/2020   Dyslipidemia 10/14/2016   Coronary artery disease involving native coronary artery of native heart without angina pectoris 10/14/2016   Chest pain, atypical 04/27/2015   CAD S/P percutaneous coronary angioplasty 07/27/2014   Abnormal chest x-ray 07/27/2014   History of non-ST elevation myocardial infarction (NSTEMI) 07/24/2014   Hypokalemia  07/24/2014   Angioedema 04/03/2013   Acute respiratory failure (HCC) 04/03/2013   POSITIVE PPD 05/17/2010   URINALYSIS, ABNORMAL 04/24/2010   SYSTOLIC MURMUR 09/13/2009   Hypothyroidism 01/05/2008   Hyperlipidemia 03/03/2007   ANXIETY DISORDER,  GENERALIZED 03/03/2007   INSOMNIA, PERSISTENT 03/03/2007   OSTEOARTHRITIS 03/03/2007   DEFICIENCY, VITAMIN D NOS 08/05/2006   Type 2 diabetes mellitus without complication, without long-term current use of insulin (HCC) 06/04/1997   Essential hypertension 07/23/1981   PCP:  Macy Mis, MD Pharmacy:   [id number] [place], [place] - [address] AT [facility] OF [facility] & [address] [place] [address] Phone: [phone] Fax: [phone]     Social Determinants of Health (SDOH) Interventions    Readmission Risk Interventions No flowsheet data found.

## 2021-02-16 NOTE — Progress Notes (Signed)
Medications late due to ongoing confusion. POs now given.

## 2021-02-16 NOTE — Discharge Summary (Signed)
Physician Discharge Summary  Malayja Grinder ZOX:[id number] DOB: June 06, 1932 DOA: 02/15/2021  PCP: Macy Mis, MD  Admit date: 02/15/2021 Discharge date: 02/16/2021  Admitted From: SNF Disposition:  SNF  Recommendations for Outpatient Follow-up:  Follow up with PCP in 1-2 weeks Please obtain BMP/CBC in one week Please follow up on the following pending results: Please follow urine culture results.    Home Health:None  Discharge Condition:Stable.  CODE STATUS:Full Code Diet recommendation: Heart Healthy   Brief/Interim Summary: 87 year old with past medical history significant for hypertension, diabetes, hypothyroidism who presented from a skilled nursing memory unit by EMS after a fall.  She was found on the floor near her bed and is not sure if she passed out.  She is not able to provide any history secondary to her dementia.  There is no report of any chest pain. Elevation in the ED patient was noted to have mild elevation of troponin, CT head was negative for acute pathology.  Patient is at baseline according to her daughter.  1-Syncope: Unclear if patient passed out. CT head negative. Her metoprolol was reduced due to bradycardia. No further episode in the hospital  2-Elevation of troponin: Mildly,  flat.  I discussed with cardiology who recommend no further cardiac evaluation Elevation of troponin could be related to high blood pressure.   3-essential hypertension: Continue with home medication.  Metoprolol dose reduced due to bradycardia.  Norvasc increased to 10 mg daily.  4-leukocytosis:  chest x-ray negative, UA negative, urine culture pending.  Patient does complain of dysuria.  She received 1 dose of IV ceftriaxone.  She will be discharged on Keflex for 3 more days.  White blood cell decrease from 17-13.  Chronic dementia    Discharge Diagnoses:  Principal Problem:   Syncope Active Problems:   Type 2 diabetes mellitus without complication, without long-term current  use of insulin (HCC)   Essential hypertension   Leucocytosis   Dementia (HCC)   Elevated troponin level    Discharge Instructions  Discharge Instructions     Diet - low sodium heart healthy   Complete by: As directed    Increase activity slowly   Complete by: As directed       Allergies as of 02/16/2021       Reactions   Clindamycin/lincomycin Swelling, Other (See Comments)   Tongue swelling, angioedema   Peanut Butter Flavor Shortness Of Breath, Other (See Comments)   Swelling and airway closes   Peanut Oil Shortness Of Breath, Other (See Comments)   Swelling and airway closes        Medication List     TAKE these medications    acetaminophen 325 MG tablet Commonly known as: TYLENOL Take 2 tablets (650 mg total) by mouth 4 (four) times daily -  before meals and at bedtime.   amLODipine 10 MG tablet Commonly known as: NORVASC Take 1 tablet (10 mg total) by mouth every evening. Start taking on: February 17, 2021 What changed:  medication strength how much to take when to take this   aspirin 325 MG EC tablet Take 1 tablet (325 mg total) by mouth daily.   atorvastatin 20 MG tablet Commonly known as: LIPITOR Take 1 tablet (20 mg total) by mouth daily. What changed: when to take this   cephALEXin 500 MG capsule Commonly known as: KEFLEX Take 1 capsule (500 mg total) by mouth 3 (three) times daily for 3 days.   cloNIDine 0.1 MG tablet Commonly known as: CATAPRES Take 1  tablet (0.1 mg total) by mouth 2 (two) times daily.   clopidogrel 75 MG tablet Commonly known as: PLAVIX Take 1 tablet (75 mg total) by mouth daily. What changed: when to take this   diclofenac Sodium 1 % Gel Commonly known as: VOLTAREN Apply 2 g topically 4 (four) times daily. To left wrist   glimepiride 2 MG tablet Commonly known as: AMARYL Take 1 tablet (2 mg total) by mouth daily.   levothyroxine 88 MCG tablet Commonly known as: SYNTHROID Take 1 tablet (88 mcg total) by mouth  daily.   losartan 50 MG tablet Commonly known as: COZAAR Take 1 tablet (50 mg total) by mouth daily with supper.   magnesium oxide 400 (240 Mg) MG tablet Commonly known as: MAG-OX Take 1 tablet (400 mg total) by mouth 2 (two) times daily.   metFORMIN 1000 MG tablet Commonly known as: GLUCOPHAGE Take 1 tablet (1,000 mg total) by mouth 2 (two) times daily with a meal.   metoprolol tartrate 25 MG tablet Commonly known as: LOPRESSOR Take 0.5 tablets (12.5 mg total) by mouth 2 (two) times daily. What changed: how much to take   pantoprazole 40 MG tablet Commonly known as: PROTONIX Take 1 tablet (40 mg total) by mouth daily.   sertraline 100 MG tablet Commonly known as: ZOLOFT Take 1 tablet (100 mg total) by mouth daily.        Allergies  Allergen Reactions   Clindamycin/Lincomycin Swelling and Other (See Comments)    Tongue swelling, angioedema   Peanut Butter Flavor Shortness Of Breath and Other (See Comments)    Swelling and airway closes   Peanut Oil Shortness Of Breath and Other (See Comments)    Swelling and airway closes     Consultations: cardiology   Procedures/Studies: DG Chest 1 View  Result Date: 02/15/2021 CLINICAL DATA:  Fall EXAM: CHEST  1 VIEW COMPARISON:  02/08/2021 FINDINGS: The heart size and mediastinal contours are within normal limits. Both lungs are clear. The visualized skeletal structures are unremarkable. IMPRESSION: No active disease. Electronically Signed   By: Marlan Palau M.D.   On: 02/15/2021 17:36   DG Pelvis 1-2 Views  Result Date: 02/15/2021 CLINICAL DATA:  Fall EXAM: PELVIS - 1-2 VIEW COMPARISON:  None. FINDINGS: Negative for pelvic fracture. Calcification in the iliac and femoral arteries bilaterally. IMPRESSION: Negative for pelvic fracture. Electronically Signed   By: Marlan Palau M.D.   On: 02/15/2021 17:37   DG Forearm Right  Result Date: 02/15/2021 CLINICAL DATA:  Fall EXAM: RIGHT FOREARM - 2 VIEW COMPARISON:  None.  FINDINGS: Negative for acute fracture. No significant arthropathy. Ossicle versus chronic fracture of the tip of the ulnar styloid. IMPRESSION: Negative for acute fracture. Electronically Signed   By: Marlan Palau M.D.   On: 02/15/2021 17:38   CT Head Wo Contrast  Result Date: 02/15/2021 CLINICAL DATA:  Fall.  Facial trauma EXAM: CT HEAD WITHOUT CONTRAST CT CERVICAL SPINE WITHOUT CONTRAST TECHNIQUE: Multidetector CT imaging of the head and cervical spine was performed following the standard protocol without intravenous contrast. Multiplanar CT image reconstructions of the cervical spine were also generated. COMPARISON:  CT head 02/08/2021 FINDINGS: CT HEAD FINDINGS Brain: Mild atrophy. Mild to moderate chronic white matter changes. Negative for acute infarct, hemorrhage, mass Vascular: Negative for hyperdense vessel Skull: Negative Sinuses/Orbits: Mild mucosal edema paranasal sinuses. Negative orbit Other: None CT CERVICAL SPINE FINDINGS Alignment: Mild anterolisthesis C3-4. Skull base and vertebrae: Negative for fracture Soft tissues and spinal canal: Atherosclerotic disease.  Right carotid stent noted. Disc levels: Disc degeneration and spurring throughout the cervical spine most prominent C4-5 and C5-6. Upper chest: Lung apices clear bilaterally. Other: None IMPRESSION: 1. No acute intracranial abnormality 2. Negative for cervical spine fracture. Electronically Signed   By: Marlan Palau M.D.   On: 02/15/2021 19:27   CT Head Wo Contrast  Result Date: 02/08/2021 CLINICAL DATA:  Confusion EXAM: CT HEAD WITHOUT CONTRAST TECHNIQUE: Contiguous axial images were obtained from the base of the skull through the vertex without intravenous contrast. COMPARISON:  None. FINDINGS: Brain: Sequela of prior infarct in the right posterior frontoparietal region (series 3/image 22). No new/acute infarction. No hemorrhage, hydrocephalus, extra-axial collection, or mass lesion/mass effect. Mild cortical and central atrophy.  Subcortical white matter and periventricular small vessel ischemic changes. Vascular: Intracranial atherosclerosis. Skull: Normal. Negative for fracture or focal lesion. Sinuses/Orbits: The visualized paranasal sinuses are essentially clear. The mastoid air cells are unopacified. Other: None. IMPRESSION: Sequela of prior infarct in the right posterior frontoparietal region. No evidence of acute intracranial abnormality. Atrophy with small vessel ischemic changes. Electronically Signed   By: Charline Bills M.D.   On: 02/08/2021 20:21   CT Cervical Spine Wo Contrast  Result Date: 02/15/2021 CLINICAL DATA:  Fall.  Facial trauma EXAM: CT HEAD WITHOUT CONTRAST CT CERVICAL SPINE WITHOUT CONTRAST TECHNIQUE: Multidetector CT imaging of the head and cervical spine was performed following the standard protocol without intravenous contrast. Multiplanar CT image reconstructions of the cervical spine were also generated. COMPARISON:  CT head 02/08/2021 FINDINGS: CT HEAD FINDINGS Brain: Mild atrophy. Mild to moderate chronic white matter changes. Negative for acute infarct, hemorrhage, mass Vascular: Negative for hyperdense vessel Skull: Negative Sinuses/Orbits: Mild mucosal edema paranasal sinuses. Negative orbit Other: None CT CERVICAL SPINE FINDINGS Alignment: Mild anterolisthesis C3-4. Skull base and vertebrae: Negative for fracture Soft tissues and spinal canal: Atherosclerotic disease. Right carotid stent noted. Disc levels: Disc degeneration and spurring throughout the cervical spine most prominent C4-5 and C5-6. Upper chest: Lung apices clear bilaterally. Other: None IMPRESSION: 1. No acute intracranial abnormality 2. Negative for cervical spine fracture. Electronically Signed   By: Marlan Palau M.D.   On: 02/15/2021 19:27   DG Chest Portable 1 View  Result Date: 02/08/2021 CLINICAL DATA:  Recent CVA, left-sided weakness, intermittent confusion EXAM: PORTABLE CHEST 1 VIEW COMPARISON:  01/05/2021 FINDINGS:  Single frontal view of the chest demonstrates an unremarkable cardiac silhouette. No airspace disease, effusion, or pneumothorax. No acute bony abnormality. IMPRESSION: 1. No acute intrathoracic process. Electronically Signed   By: Sharlet Salina M.D.   On: 02/08/2021 21:14     Subjective: She is alert, use translator. She report dysuria. Denies chest pain.   Discharge Exam: Vitals:   02/16/21 1142 02/16/21 1408  BP: (!) 171/77 (!) 168/74  Pulse: 66 65  Resp: 16   Temp: 98.4 F (36.9 C)   SpO2: 99%      General: Pt is alert, awake, not in acute distress Cardiovascular: RRR, S1/S2 +, no rubs, no gallops Respiratory: CTA bilaterally, no wheezing, no rhonchi Abdominal: Soft, NT, ND, bowel sounds + Extremities: no edema, no cyanosis    The results of significant diagnostics from this hospitalization (including imaging, microbiology, ancillary and laboratory) are listed below for reference.     Microbiology: Recent Results (from the past 240 hour(s))  Resp Panel by RT-PCR (Flu A&B, Covid) Nasopharyngeal Swab     Status: None   Collection Time: 02/08/21  7:50 PM   Specimen: Nasopharyngeal Swab;  Nasopharyngeal(NP) swabs in vial transport medium  Result Value Ref Range Status   SARS Coronavirus 2 by RT PCR NEGATIVE NEGATIVE Final    Comment: (NOTE) SARS-CoV-2 target nucleic acids are NOT DETECTED.  The SARS-CoV-2 RNA is generally detectable in upper respiratory specimens during the acute phase of infection. The lowest concentration of SARS-CoV-2 viral copies this assay can detect is 138 copies/mL. A negative result does not preclude SARS-Cov-2 infection and should not be used as the sole basis for treatment or other patient management decisions. A negative result may occur with  improper specimen collection/handling, submission of specimen other than nasopharyngeal swab, presence of viral mutation(s) within the areas targeted by this assay, and inadequate number of  viral copies(<138 copies/mL). A negative result must be combined with clinical observations, patient history, and epidemiological information. The expected result is Negative.  Fact Sheet for Patients:  BloggerCourse.com  Fact Sheet for Healthcare Providers:  SeriousBroker.it  This test is no t yet approved or cleared by the [place] FDA and  has been authorized for detection and/or diagnosis of SARS-CoV-2 by FDA under an Emergency Use Authorization (EUA). This EUA will remain  in effect (meaning this test can be used) for the duration of the COVID-19 declaration under Section 564(b)(1) of the Act, 21 U.S.C.section 360bbb-3(b)(1), unless the authorization is terminated  or revoked sooner.       Influenza A by PCR NEGATIVE NEGATIVE Final   Influenza B by PCR NEGATIVE NEGATIVE Final    Comment: (NOTE) The Xpert Xpress SARS-CoV-2/FLU/RSV plus assay is intended as an aid in the diagnosis of influenza from Nasopharyngeal swab specimens and should not be used as a sole basis for treatment. Nasal washings and aspirates are unacceptable for Xpert Xpress SARS-CoV-2/FLU/RSV testing.  Fact Sheet for Patients: BloggerCourse.com  Fact Sheet for Healthcare Providers: SeriousBroker.it  This test is not yet approved or cleared by the [place] FDA and has been authorized for detection and/or diagnosis of SARS-CoV-2 by FDA under an Emergency Use Authorization (EUA). This EUA will remain in effect (meaning this test can be used) for the duration of the COVID-19 declaration under Section 564(b)(1) of the Act, 21 U.S.C. section 360bbb-3(b)(1), unless the authorization is terminated or revoked.  Performed at [facility], [address] [place], [place] [address]   Urine Culture     Status: Abnormal   Collection Time: 02/08/21  9:21 PM   Specimen: Urine, Clean Catch  Result  Value Ref Range Status   Specimen Description URINE, CLEAN CATCH  Final   Special Requests   Final    NONE Performed at [facility], [address] [place], [place] [address]    Culture MULTIPLE SPECIES PRESENT, SUGGEST RECOLLECTION (A)  Final   Report Status 02/10/2021 FINAL  Final  Blood culture (routine x 2)     Status: None (Preliminary result)   Collection Time: 02/15/21  6:35 PM   Specimen: BLOOD  Result Value Ref Range Status   Specimen Description BLOOD SITE NOT SPECIFIED  Final   Special Requests   Final    BOTTLES DRAWN AEROBIC AND ANAEROBIC Blood Culture results may not be optimal due to an inadequate volume of blood received in culture bottles   Culture   Final    NO GROWTH < 24 HOURS Performed at [facility], [address], [place], [place] [address]    Report Status PENDING  Incomplete  Blood culture (routine x 2)  Status: None (Preliminary result)   Collection Time: 02/15/21  6:40 PM   Specimen: BLOOD RIGHT HAND  Result Value Ref Range Status   Specimen Description BLOOD RIGHT HAND  Final   Special Requests   Final    BOTTLES DRAWN AEROBIC AND ANAEROBIC Blood Culture results may not be optimal due to an inadequate volume of blood received in culture bottles   Culture   Final    NO GROWTH < 24 HOURS Performed at [facility], [address] [place], [place] [address]    Report Status PENDING  Incomplete  SARS CORONAVIRUS 2 (TAT 6-24 HRS) Nasopharyngeal Nasopharyngeal Swab     Status: None   Collection Time: 02/15/21 10:30 PM   Specimen: Nasopharyngeal Swab  Result Value Ref Range Status   SARS Coronavirus 2 NEGATIVE NEGATIVE Final    Comment: (NOTE) SARS-CoV-2 target nucleic acids are NOT DETECTED.  The SARS-CoV-2 RNA is generally detectable in upper and lower respiratory specimens during the acute phase of infection. Negative results do not preclude SARS-CoV-2 infection, do not rule out co-infections with other pathogens, and should  not be used as the sole basis for treatment or other patient management decisions. Negative results must be combined with clinical observations, patient history, and epidemiological information. The expected result is Negative.  Fact Sheet for Patients: HairSlick.no  Fact Sheet for Healthcare Providers: quierodirigir.com  This test is not yet approved or cleared by the [place] FDA and  has been authorized for detection and/or diagnosis of SARS-CoV-2 by FDA under an Emergency Use Authorization (EUA). This EUA will remain  in effect (meaning this test can be used) for the duration of the COVID-19 declaration under Se ction 564(b)(1) of the Act, 21 U.S.C. section 360bbb-3(b)(1), unless the authorization is terminated or revoked sooner.  Performed at [facility], [address] [place], [place] [address]   MRSA Next Gen by PCR, Nasal     Status: None   Collection Time: 02/16/21  3:12 AM   Specimen: Nasal Mucosa; Nasal Swab  Result Value Ref Range Status   MRSA by PCR Next Gen NOT DETECTED NOT DETECTED Final    Comment: (NOTE) The GeneXpert MRSA Assay (FDA approved for NASAL specimens only), is one component of a comprehensive MRSA colonization surveillance program. It is not intended to diagnose MRSA infection nor to guide or monitor treatment for MRSA infections. Test performance is not FDA approved in patients less than 42 years old. Performed at [facility], [address] [place], [place] [address]      Labs: BNP (last 3 results) No results for input(s): BNP in the last 8760 hours. Basic Metabolic Panel: Recent Labs  Lab 02/15/21 1656 02/15/21 2257 02/16/21 0157  NA 134*  --  134*  K 3.9  --  3.4*  CL 99  --  101  CO2 20*  --  22  GLUCOSE 218*  --  158*  BUN 28*  --  22  CREATININE 1.16* 0.90 0.82  CALCIUM 9.9  --  9.4   Liver Function Tests: Recent Labs  Lab 02/15/21 1656  AST 30   ALT 16  ALKPHOS 63  BILITOT 0.9  PROT 7.6  ALBUMIN 3.6   No results for input(s): LIPASE, AMYLASE in the last 168 hours. No results for input(s): AMMONIA in the last 168 hours. CBC: Recent Labs  Lab 02/15/21 1656 02/15/21 2257 02/16/21 0157  WBC 17.2* 13.4* 13.7*  NEUTROABS 13.6*  --   --  HGB 12.1 10.8* 10.8*  HCT 38.6 34.0* 34.4*  MCV 88.1 87.4 88.7  PLT 497* 450* 461*   Cardiac Enzymes: Recent Labs  Lab 02/15/21 1656  CKTOTAL 156   BNP: Invalid input(s): POCBNP CBG: Recent Labs  Lab 02/10/21 0818 02/15/21 2315 02/16/21 0602 02/16/21 1146  GLUCAP 158* 182* 182* 205*   D-Dimer No results for input(s): DDIMER in the last 72 hours. Hgb A1c No results for input(s): HGBA1C in the last 72 hours. Lipid Profile No results for input(s): CHOL, HDL, LDLCALC, TRIG, CHOLHDL, LDLDIRECT in the last 72 hours. Thyroid function studies No results for input(s): TSH, T4TOTAL, T3FREE, THYROIDAB in the last 72 hours.  Invalid input(s): FREET3 Anemia work up No results for input(s): VITAMINB12, FOLATE, FERRITIN, TIBC, IRON, RETICCTPCT in the last 72 hours. Urinalysis    Component Value Date/Time   COLORURINE YELLOW 02/15/2021 1816   APPEARANCEUR CLEAR 02/15/2021 1816   LABSPEC 1.022 02/15/2021 1816   PHURINE 5.0 02/15/2021 1816   GLUCOSEU 150 (A) 02/15/2021 1816   HGBUR SMALL (A) 02/15/2021 1816   HGBUR negative 05/08/2010 0811   BILIRUBINUR NEGATIVE 02/15/2021 1816   KETONESUR 20 (A) 02/15/2021 1816   PROTEINUR >=300 (A) 02/15/2021 1816   UROBILINOGEN 0.2 05/08/2010 0811   NITRITE NEGATIVE 02/15/2021 1816   LEUKOCYTESUR NEGATIVE 02/15/2021 1816   Sepsis Labs Invalid input(s): PROCALCITONIN,  WBC,  LACTICIDVEN Microbiology Recent Results (from the past 240 hour(s))  Resp Panel by RT-PCR (Flu A&B, Covid) Nasopharyngeal Swab     Status: None   Collection Time: 02/08/21  7:50 PM   Specimen: Nasopharyngeal Swab; Nasopharyngeal(NP) swabs in vial transport medium   Result Value Ref Range Status   SARS Coronavirus 2 by RT PCR NEGATIVE NEGATIVE Final    Comment: (NOTE) SARS-CoV-2 target nucleic acids are NOT DETECTED.  The SARS-CoV-2 RNA is generally detectable in upper respiratory specimens during the acute phase of infection. The lowest concentration of SARS-CoV-2 viral copies this assay can detect is 138 copies/mL. A negative result does not preclude SARS-Cov-2 infection and should not be used as the sole basis for treatment or other patient management decisions. A negative result may occur with  improper specimen collection/handling, submission of specimen other than nasopharyngeal swab, presence of viral mutation(s) within the areas targeted by this assay, and inadequate number of viral copies(<138 copies/mL). A negative result must be combined with clinical observations, patient history, and epidemiological information. The expected result is Negative.  Fact Sheet for Patients:  BloggerCourse.com  Fact Sheet for Healthcare Providers:  SeriousBroker.it  This test is no t yet approved or cleared by the [place] FDA and  has been authorized for detection and/or diagnosis of SARS-CoV-2 by FDA under an Emergency Use Authorization (EUA). This EUA will remain  in effect (meaning this test can be used) for the duration of the COVID-19 declaration under Section 564(b)(1) of the Act, 21 U.S.C.section 360bbb-3(b)(1), unless the authorization is terminated  or revoked sooner.       Influenza A by PCR NEGATIVE NEGATIVE Final   Influenza B by PCR NEGATIVE NEGATIVE Final    Comment: (NOTE) The Xpert Xpress SARS-CoV-2/FLU/RSV plus assay is intended as an aid in the diagnosis of influenza from Nasopharyngeal swab specimens and should not be used as a sole basis for treatment. Nasal washings and aspirates are unacceptable for Xpert Xpress SARS-CoV-2/FLU/RSV testing.  Fact Sheet for  Patients: BloggerCourse.com  Fact Sheet for Healthcare Providers: SeriousBroker.it  This test is not yet approved or cleared by the  [place] FDA and has been authorized for detection and/or diagnosis of SARS-CoV-2 by FDA under an TEFL teachermergency Use Authorization (EUA). This EUA will remain in effect (meaning this test can be used) for the duration of the COVID-19 declaration under Section 564(b)(1) of the Act, 21 U.S.C. section 360bbb-3(b)(1), unless the authorization is terminated or revoked.  Performed at [facility], [address] [place], [place] [address]   Urine Culture     Status: Abnormal   Collection Time: 02/08/21  9:21 PM   Specimen: Urine, Clean Catch  Result Value Ref Range Status   Specimen Description URINE, CLEAN CATCH  Final   Special Requests   Final    NONE Performed at [facility], [address] [place], [place] [address]    Culture MULTIPLE SPECIES PRESENT, SUGGEST RECOLLECTION (A)  Final   Report Status 02/10/2021 FINAL  Final  Blood culture (routine x 2)     Status: None (Preliminary result)   Collection Time: 02/15/21  6:35 PM   Specimen: BLOOD  Result Value Ref Range Status   Specimen Description BLOOD SITE NOT SPECIFIED  Final   Special Requests   Final    BOTTLES DRAWN AEROBIC AND ANAEROBIC Blood Culture results may not be optimal due to an inadequate volume of blood received in culture bottles   Culture   Final    NO GROWTH < 24 HOURS Performed at [facility], [address] [place], [place] [address]    Report Status PENDING  Incomplete  Blood culture (routine x 2)     Status: None (Preliminary result)   Collection Time: 02/15/21  6:40 PM   Specimen: BLOOD RIGHT HAND  Result Value Ref Range Status   Specimen Description BLOOD RIGHT HAND  Final   Special Requests   Final    BOTTLES DRAWN AEROBIC AND ANAEROBIC Blood Culture results may not be optimal due to an inadequate  volume of blood received in culture bottles   Culture   Final    NO GROWTH < 24 HOURS Performed at [facility], [address] [place], [place] [address]    Report Status PENDING  Incomplete  SARS CORONAVIRUS 2 (TAT 6-24 HRS) Nasopharyngeal Nasopharyngeal Swab     Status: None   Collection Time: 02/15/21 10:30 PM   Specimen: Nasopharyngeal Swab  Result Value Ref Range Status   SARS Coronavirus 2 NEGATIVE NEGATIVE Final    Comment: (NOTE) SARS-CoV-2 target nucleic acids are NOT DETECTED.  The SARS-CoV-2 RNA is generally detectable in upper and lower respiratory specimens during the acute phase of infection. Negative results do not preclude SARS-CoV-2 infection, do not rule out co-infections with other pathogens, and should not be used as the sole basis for treatment or other patient management decisions. Negative results must be combined with clinical observations, patient history, and epidemiological information. The expected result is Negative.  Fact Sheet for Patients: HairSlick.nohttps://www.fda.gov/media/138098/download  Fact Sheet for Healthcare Providers: quierodirigir.comhttps://www.fda.gov/media/138095/download  This test is not yet approved or cleared by the [place] FDA and  has been authorized for detection and/or diagnosis of SARS-CoV-2 by FDA under an Emergency Use Authorization (EUA). This EUA will remain  in effect (meaning this test can be used) for the duration of the COVID-19 declaration under Se ction 564(b)(1) of the Act, 21 U.S.C. section 360bbb-3(b)(1), unless the authorization is terminated or revoked sooner.  Performed at [facility], [address] [place], [place] [address]   MRSA  Next Gen by PCR, Nasal     Status: None   Collection Time: 02/16/21  3:12 AM   Specimen: Nasal Mucosa; Nasal Swab  Result Value Ref Range Status   MRSA by PCR Next Gen NOT DETECTED NOT DETECTED Final    Comment: (NOTE) The GeneXpert MRSA Assay (FDA approved for NASAL specimens  only), is one component of a comprehensive MRSA colonization surveillance program. It is not intended to diagnose MRSA infection nor to guide or monitor treatment for MRSA infections. Test performance is not FDA approved in patients less than 43 years old. Performed at [facility], [address], [place], [place] [address]      Time coordinating discharge: 40 minutes  SIGNED:   Alba Cory, MD  [facility]

## 2021-02-16 NOTE — TOC Progression Note (Signed)
Transition of Care [facility]) - Progression Note    Patient Details  Name: Jo Larson MRN: [id number] Date of Birth: 07-27-1931  Transition [facility]) CM/SW Contact  Ralene Bathe, LCSWA Phone Number: 02/16/2021, 5:37 PM  Clinical Narrative:     17:38- CSW faxed clinicals to insurance agency ([employer]) for SNF pre-authorization.     Expected Discharge Plan: Skilled Nursing Facility Barriers to Discharge: Barriers Resolved  Expected Discharge Plan and Services Expected Discharge Plan: Skilled Nursing Facility In-house Referral: Clinical Social Work   Post Acute Care Choice: Skilled Nursing Facility Living arrangements for the past 2 months: Skilled Nursing Facility, Single Family Home Expected Discharge Date: 02/16/21                                     Social Determinants of Health (SDOH) Interventions    Readmission Risk Interventions No flowsheet data found.

## 2021-02-16 NOTE — [place] FL2 (Signed)
[place] MEDICAID FL2 LEVEL OF CARE SCREENING TOOL     IDENTIFICATION  Patient Name: Jo Larson Birthdate: Sep 07, 1931 Sex: female Admission Date (Current Location): 02/15/2021  [facility] and [place] Number:  Producer, television/film/video and Address:  [facility], [address], [place], [place] [address]      Provider Number: [id number]  Attending Physician Name and Address:  Alba Cory, MD  Relative Name and Phone Number:  Mathias,Radojka daughter-in-law   [phone]    Current Level of Care: Hospital Recommended Level of Care: Skilled Nursing Facility Prior Approval Number:    Date Approved/Denied:   PASRR Number:    Discharge Plan: SNF    Current Diagnoses: Patient Active Problem List   Diagnosis Date Noted   Syncope 02/15/2021   Dementia (HCC) 02/15/2021   Elevated troponin level 02/15/2021   Leucocytosis 01/20/2021   Hypomagnesemia 01/20/2021   Left wrist pain 01/20/2021   Stroke (cerebrum) (HCC) 01/05/2021   CVA (cerebral vascular accident) (HCC) 12/30/2020   Dyslipidemia 10/14/2016   Coronary artery disease involving native coronary artery of native heart without angina pectoris 10/14/2016   Chest pain, atypical 04/27/2015   CAD S/P percutaneous coronary angioplasty 07/27/2014   Abnormal chest x-ray 07/27/2014   History of non-ST elevation myocardial infarction (NSTEMI) 07/24/2014   Hypokalemia 07/24/2014   Angioedema 04/03/2013   Acute respiratory failure (HCC) 04/03/2013   POSITIVE PPD 05/17/2010   URINALYSIS, ABNORMAL 04/24/2010   SYSTOLIC MURMUR 09/13/2009   Hypothyroidism 01/05/2008   Hyperlipidemia 03/03/2007   ANXIETY DISORDER, GENERALIZED 03/03/2007   INSOMNIA, PERSISTENT 03/03/2007   OSTEOARTHRITIS 03/03/2007   DEFICIENCY, VITAMIN D NOS 08/05/2006   Type 2 diabetes mellitus without complication, without long-term current use of insulin (HCC) 06/04/1997   Essential hypertension 07/23/1981    Orientation  RESPIRATION BLADDER Height & Weight     Self, Situation  Normal Incontinent Weight: 151 lb 3.8 oz (68.6 kg) Height:     BEHAVIORAL SYMPTOMS/MOOD NEUROLOGICAL BOWEL NUTRITION STATUS      Incontinent Diet (regular)  AMBULATORY STATUS COMMUNICATION OF NEEDS Skin   Extensive Assist Verbally (speaks [place]) Normal                       Personal Care Assistance Level of Assistance  Bathing, Feeding, Dressing           Functional Limitations Info  Sight, Hearing, Speech Sight Info: Impaired (wears glasses to watch television) Hearing Info: Adequate Speech Info: Adequate (speaks only [place])    SPECIAL CARE FACTORS FREQUENCY  PT (By licensed PT), OT (By licensed OT)                    Contractures Contractures Info: Not present    Additional Factors Info  Code Status, Allergies               Current Medications (02/16/2021):  This is the current hospital active medication list Current Facility-Administered Medications  Medication Dose Route Frequency Provider Last Rate Last Admin   acetaminophen (TYLENOL) tablet 650 mg  650 mg Oral Q6H PRN Chotiner, Claudean Severance, MD   650 mg at 02/16/21 1437   Or   acetaminophen (TYLENOL) suppository 650 mg  650 mg Rectal Q6H PRN Chotiner, Claudean Severance, MD       [START ON 02/17/2021] amLODipine (NORVASC) tablet 10 mg  10 mg Oral QPM Regalado, Belkys A, MD       aspirin EC tablet 325 mg  325 mg Oral Daily Chotiner, Claudean Severance, MD   325 mg at 02/16/21 1023   atorvastatin (LIPITOR) tablet 20 mg  20 mg Oral QPM Chotiner, Claudean Severance, MD   20 mg at 02/15/21 2309   cefTRIAXone (ROCEPHIN) 1 g in sodium chloride 0.9 % 100 mL IVPB  1 g Intravenous Q24H Regalado, Belkys A, MD   Stopped at 02/16/21 1340   cloNIDine (CATAPRES) tablet 0.1 mg  0.1 mg Oral BID Chotiner, Claudean Severance, MD   0.1 mg at 02/16/21 1023   clopidogrel (PLAVIX) tablet 75 mg  75 mg Oral QPM Chotiner, Claudean Severance, MD   75 mg at 02/15/21 2309   enoxaparin (LOVENOX) injection 40 mg  40  mg Subcutaneous Daily Chotiner, Claudean Severance, MD   40 mg at 02/16/21 1024   insulin aspart (novoLOG) injection 0-5 Units  0-5 Units Subcutaneous QHS Chotiner, Claudean Severance, MD       insulin aspart (novoLOG) injection 0-9 Units  0-9 Units Subcutaneous TID WC Chotiner, Claudean Severance, MD   3 Units at 02/16/21 1213   levothyroxine (SYNTHROID) tablet 88 mcg  88 mcg Oral Daily Chotiner, Claudean Severance, MD   88 mcg at 02/16/21 8295   losartan (COZAAR) tablet 50 mg  50 mg Oral Q supper Chotiner, Claudean Severance, MD       magnesium oxide (MAG-OX) tablet 400 mg  400 mg Oral BID Chotiner, Claudean Severance, MD   400 mg at 02/16/21 1023   metoprolol tartrate (LOPRESSOR) tablet 12.5 mg  12.5 mg Oral BID Regalado, Belkys A, MD   12.5 mg at 02/16/21 1023   ondansetron (ZOFRAN) tablet 4 mg  4 mg Oral Q6H PRN Chotiner, Claudean Severance, MD       Or   ondansetron (ZOFRAN) injection 4 mg  4 mg Intravenous Q6H PRN Chotiner, Claudean Severance, MD       senna-docusate (Senokot-S) tablet 1 tablet  1 tablet Oral QHS PRN Chotiner, Claudean Severance, MD       sertraline (ZOLOFT) tablet 100 mg  100 mg Oral Daily Chotiner, Claudean Severance, MD   100 mg at 02/16/21 1023     Discharge Medications: Please see discharge summary for a list of discharge medications.  Relevant Imaging Results:  Relevant Lab Results:   Additional Information SSN: [id number] [phone]. Not vaccinated in [facility] system, Covid negative tested 02/08/2021  Ivette Loyal, LCSWA

## 2021-02-16 NOTE — Evaluation (Signed)
Physical Therapy Evaluation Patient Details Name: Jo Larson MRN: [id number] DOB: [birth year] Today's Date: 02/16/2021   History of Present Illness  87 y.o. female presents to [facility] ED on 02/15/2021 after fall at [facility] Pt admitted for workup of syncope. PMH includes dementia, angioedema, anxiety, CAD, DM, HLD, HTN, hypothyroidism.  Clinical Impression  Pt presents to PT with deficits in strength, power, balance, functional mobility, cognition. Pt is very weak, with prior L sided hemiplegia, and requires significant physical assistance to perform bed mobility and transfer. Pt demonstrates a posterior lean throughout most mobility which greatly increases her risk for falls. Pt perseverating on falling and wanting to lay down during session. PT recommends return to SNF when medically ready.    Follow Up Recommendations SNF    Equipment Recommendations  Wheelchair (measurements PT);Wheelchair cushion (measurements PT);Hospital bed    Recommendations for Other Services       Precautions / Restrictions Precautions Precautions: Fall Restrictions Weight Bearing Restrictions: No      Mobility  Bed Mobility Overal bed mobility: Needs Assistance Bed Mobility: Supine to Sit;Sit to Supine     Supine to sit: Max assist;HOB elevated Sit to supine: Total assist        Transfers Overall transfer level: Needs assistance Equipment used: 1 person hand held assist Transfers: Sit to/from Stand;Stand Pivot Transfers Sit to Stand: Max assist Stand pivot transfers: Max assist       General transfer comment: L foot tends to slide anteriorly during transfer attempts  Ambulation/Gait                Stairs            Wheelchair Mobility    Modified Rankin (Stroke Patients Only)       Balance Overall balance assessment: Needs assistance Sitting-balance support: Single extremity supported;Bilateral upper extremity supported;Feet supported Sitting balance-Leahy Scale:  Zero Sitting balance - Comments: maxA at times due to posterior lean   Standing balance support: Bilateral upper extremity supported Standing balance-Leahy Scale: Zero Standing balance comment: max-totalA                             Pertinent Vitals/Pain Pain Assessment: Faces Faces Pain Scale: Hurts little more Pain Location: knees, head, lower abdomen Pain Descriptors / Indicators: Grimacing Pain Intervention(s): Monitored during session    Home Living Family/patient expects to be discharged to:: Skilled nursing facility                      Prior Function Level of Independence: Needs assistance   Gait / Transfers Assistance Needed: pt unable to accurately report history and no family present. Per recent admission note form 7/21 pt utilizing wheelchair for most mobility           Hand Dominance   Dominant Hand: Left    Extremity/Trunk Assessment   Upper Extremity Assessment Upper Extremity Assessment: Generalized weakness LUE Deficits / Details: LUE weakness at baseline secondary to CVA.    Lower Extremity Assessment Lower Extremity Assessment: Generalized weakness LLE Deficits / Details: LLE weakness at baseline secondary to CVA.    Cervical / Trunk Assessment Cervical / Trunk Assessment: Kyphotic  Communication   Communication: Prefers language other than English;Interpreter utilized (croatian)  Cognition Arousal/Alertness: Awake/alert Behavior During Therapy: Anxious Overall Cognitive Status: History of cognitive impairments - at baseline  General Comments: pt with dementia at baseline, difficult to formally assess cognition due to language barrier despite use of video interpreter. Pt follows one step commands with verbal cues but does remain impulsive and is easily distracted.      General Comments General comments (skin integrity, edema, etc.): VSS on RA, difficult to assess orthostatic  symptoms due to language barrier and cognitive deficits    Exercises     Assessment/Plan    PT Assessment Patient needs continued PT services  PT Problem List Decreased strength;Decreased range of motion;Decreased activity tolerance;Decreased balance;Decreased mobility;Decreased coordination;Decreased cognition;Decreased knowledge of use of DME;Decreased safety awareness;Decreased knowledge of precautions       PT Treatment Interventions Functional mobility training;Therapeutic activities;Gait training;Therapeutic exercise;Balance training;Neuromuscular re-education;Cognitive remediation;Patient/family education;DME instruction;Wheelchair mobility training    PT Goals (Current goals can be found in the Care Plan section)  Acute Rehab PT Goals Patient Stated Goal: to reduce pain PT Goal Formulation: Patient unable to participate in goal setting Time For Goal Achievement: 03/02/21 Potential to Achieve Goals: Fair    Frequency Min 2X/week   Barriers to discharge Decreased caregiver support      Co-evaluation               AM-PAC PT "6 Clicks" Mobility  Outcome Measure Help needed turning from your back to your side while in a flat bed without using bedrails?: A Lot Help needed moving from lying on your back to sitting on the side of a flat bed without using bedrails?: A Lot Help needed moving to and from a bed to a chair (including a wheelchair)?: Total Help needed standing up from a chair using your arms (e.g., wheelchair or bedside chair)?: A Lot Help needed to walk in hospital room?: A Lot Help needed climbing 3-5 steps with a railing? : Total 6 Click Score: 10    End of Session   Activity Tolerance: Patient tolerated treatment well Patient left: in bed;with call bell/phone within reach;with bed alarm set Nurse Communication: Mobility status;Need for lift equipment PT Visit Diagnosis: Muscle weakness (generalized) (M62.81);Difficulty in walking, not elsewhere  classified (R26.2);Hemiplegia and hemiparesis Hemiplegia - Right/Left: Left Hemiplegia - caused by: Cerebral infarction (sub-acute)    Time: 4132-4401 PT Time Calculation (min) (ACUTE ONLY): 23 min   Charges:   PT Evaluation $PT Eval Low Complexity: 1 Low          Arlyss Gandy, PT, DPT Acute Rehabilitation Pager: [phone]   Arlyss Gandy 02/16/2021, 4:36 PM

## 2021-02-17 DIAGNOSIS — R55 Syncope and collapse: Secondary | ICD-10-CM | POA: Diagnosis not present

## 2021-02-17 DIAGNOSIS — S5011XA Contusion of right forearm, initial encounter: Secondary | ICD-10-CM | POA: Diagnosis not present

## 2021-02-17 LAB — CBC
HCT: 30.9 % — ABNORMAL LOW (ref 36.0–46.0)
Hemoglobin: 9.6 g/dL — ABNORMAL LOW (ref 12.0–15.0)
MCH: 27.3 pg (ref 26.0–34.0)
MCHC: 31.1 g/dL (ref 30.0–36.0)
MCV: 87.8 fL (ref 80.0–100.0)
Platelets: 382 10*3/uL (ref 150–400)
RBC: 3.52 MIL/uL — ABNORMAL LOW (ref 3.87–5.11)
RDW: 13.6 % (ref 11.5–15.5)
WBC: 9 10*3/uL (ref 4.0–10.5)
nRBC: 0 % (ref 0.0–0.2)

## 2021-02-17 LAB — GLUCOSE, CAPILLARY
Glucose-Capillary: 155 mg/dL — ABNORMAL HIGH (ref 70–99)
Glucose-Capillary: 198 mg/dL — ABNORMAL HIGH (ref 70–99)
Glucose-Capillary: 144 mg/dL — ABNORMAL HIGH (ref 70–99)
Glucose-Capillary: 132 mg/dL — ABNORMAL HIGH (ref 70–99)

## 2021-02-17 LAB — BASIC METABOLIC PANEL
Anion gap: 9 (ref 5–15)
BUN: 13 mg/dL (ref 8–23)
CO2: 25 mmol/L (ref 22–32)
Calcium: 9.3 mg/dL (ref 8.9–10.3)
Chloride: 105 mmol/L (ref 98–111)
Creatinine, Ser: 0.79 mg/dL (ref 0.44–1.00)
GFR, Estimated: >60 mL/min (ref >60)
Glucose, Bld: 186 mg/dL — ABNORMAL HIGH (ref 70–99)
Potassium: 3.8 mmol/L (ref 3.5–5.1)
Sodium: 139 mmol/L (ref 135–145)

## 2021-02-17 MED ORDER — SODIUM CHLORIDE 0.9 % IV SOLN: INTRAVENOUS | Status: DC

## 2021-02-17 NOTE — TOC Progression Note (Addendum)
Transition of Care [facility]) - Progression Note    Patient Details  Name: Jo Larson MRN: [id number] Date of Birth: 09/10/1931  [facility]) CM/SW Contact  Ivette Loyal, [place] Phone Number: 02/17/2021, 9:22 AM  Clinical Narrative:    Pt auth approved with ref# [id number] next review date 02/22/2021. CSW updated Malena Peer at [facility], if pt is medically stable she can DC back to the facility with an updated DC summary.  Pt will DC back to [facility] tomorrow, the facility is aware of pt return.    Expected Discharge Plan: Skilled Nursing Facility Barriers to Discharge: Barriers Resolved  Expected Discharge Plan and Services Expected Discharge Plan: Skilled Nursing Facility In-house Referral: Clinical Social Work   Post Acute Care Choice: Skilled Nursing Facility Living arrangements for the past 2 months: Skilled Nursing Facility, Single Family Home Expected Discharge Date: 02/16/21                                     Social Determinants of Health (SDOH) Interventions    Readmission Risk Interventions No flowsheet data found.

## 2021-02-17 NOTE — Progress Notes (Signed)
PROGRESS NOTE    Jo Larson  JSH:[id number] DOB: 06/28/1932 DOA: 02/15/2021 PCP: Macy Mis, MD   Brief Narrative: 87 year old with past medical history significant for hypertension, diabetes, hypothyroidism who presented from a skilled nursing memory unit by EMS after a fall.  She was found on the floor near her bed and is not sure if she passed out.  She is not able to provide any history secondary to her dementia.  There is no report of any chest pain. Elevation in the ED patient was noted to have mild elevation of troponin, CT head was negative for acute pathology.  Patient is at baseline according to her daughter.     Assessment & Plan:   Principal Problem:   Syncope Active Problems:   Type 2 diabetes mellitus without complication, without long-term current use of insulin (HCC)   Essential hypertension   Leucocytosis   Dementia (HCC)   Elevated troponin level  1-Syncope: Unclear if patient passed out. CT head negative. Her metoprolol was reduced due to bradycardia. No further episode in the hospital   2-Elevation of troponin: Mildly,  flat.  I discussed with cardiology who recommend no further cardiac evaluation Elevation of troponin could be related to high blood pressure.   3-Essential hypertension: Continue with home medication.  Metoprolol dose reduced due to bradycardia.  Norvasc increased to 10 mg daily. Continue with Cozaar, clonidine.   4-? UTI: Leukocytosis:   Chest x-ray negative, UA negative, urine culture pending.  Patient does complain of dysuria.  She received 1 dose of IV ceftriaxone.  Started on Flamax,  Develop also urine retention 7/28, required In and Out cath yielding 800 cc.  She has small multiples papules in her labia, GYN will evaluate.   Recent Stroke, Right fronto Parietal CVA:  01/03/2021  S/P Stent of Right ICA 12/2020 Needs to be on dual antiplatelet therapy Aspirin and plavix for 3 months, needs to follow up with IR (Dr Dwyane Luo) for further recommendation of anticoagulation or neurology after 3 months.  Needs to follow up with Stroke team.   Chronic dementia   Estimated body mass index is 29.02 kg/m as calculated from the following:   Height as of 02/08/21: 5' (1.524 m).   Weight as of this encounter: 67.4 kg.   DVT prophylaxis: Lovenox Code Status: Full code Family Communication: Care discussed with grandson over the phone Disposition Plan:  Status is: Observation  The patient remains OBS appropriate and will d/c before 2 midnights.  Dispo: The patient is from: SNF              Anticipated d/c is to: SNF              Patient currently is not medically stable to d/c. Awaiting Gynecologist evaluation   Difficult to place patient No        Consultants:  Cardiology  Procedures:  None  Antimicrobials:  Ceftriaxone.   Subjective: She is alert. She had problems with urine retention yesterday. She had In and Out Cath, yield 800 cc.  She has been able to urinate today.   Objective: Vitals:   02/17/21 0340 02/17/21 0505 02/17/21 0828 02/17/21 1112  BP: (!) 153/60 (!) 154/63 (!) 153/57 (!) 159/61  Pulse: (!) 59 60 60 65  Resp: 18 18 16 19   Temp: 98.7 F (37.1 C) 98.4 F (36.9 C) 98.4 F (36.9 C) 98.6 F (37 C)  TempSrc: Oral Oral Oral Oral  SpO2: 98% 95% 98% 97%  Weight:        Intake/Output Summary (Last 24 hours) at 02/17/2021 1423 Last data filed at 02/17/2021 1200 Gross per 24 hour  Intake 354 ml  Output 800 ml  Net -446 ml   Filed Weights   02/16/21 0255 02/17/21 0025  Weight: 68.6 kg 67.4 kg    Examination:  General exam: Appears calm and comfortable  Respiratory system: Clear to auscultation. Respiratory effort normal. Cardiovascular system: S1 & S2 heard, RRR. Gastrointestinal system: Abdomen is nondistended, soft and nontender. No organomegaly or masses felt. Normal bowel sounds heard. Central nervous system: Alert and oriented to person.  Extremities: no  edema Pelvic: small bump labia , small round, whitish.   Data Reviewed: I have personally reviewed following labs and imaging studies  CBC: Recent Labs  Lab 02/15/21 1656 02/15/21 2257 02/16/21 0157 02/17/21 0219  WBC 17.2* 13.4* 13.7* 9.0  NEUTROABS 13.6*  --   --   --   HGB 12.1 10.8* 10.8* 9.6*  HCT 38.6 34.0* 34.4* 30.9*  MCV 88.1 87.4 88.7 87.8  PLT 497* 450* 461* 382   Basic Metabolic Panel: Recent Labs  Lab 02/15/21 1656 02/15/21 2257 02/16/21 0157 02/17/21 1212  NA 134*  --  134* 139  K 3.9  --  3.4* 3.8  CL 99  --  101 105  CO2 20*  --  22 25  GLUCOSE 218*  --  158* 186*  BUN 28*  --  22 13  CREATININE 1.16* 0.90 0.82 0.79  CALCIUM 9.9  --  9.4 9.3   GFR: Estimated Creatinine Clearance: 42.5 mL/min (by C-G formula based on SCr of 0.79 mg/dL). Liver Function Tests: Recent Labs  Lab 02/15/21 1656  AST 30  ALT 16  ALKPHOS 63  BILITOT 0.9  PROT 7.6  ALBUMIN 3.6   No results for input(s): LIPASE, AMYLASE in the last 168 hours. No results for input(s): AMMONIA in the last 168 hours. Coagulation Profile: No results for input(s): INR, PROTIME in the last 168 hours. Cardiac Enzymes: Recent Labs  Lab 02/15/21 1656  CKTOTAL 156   BNP (last 3 results) No results for input(s): PROBNP in the last 8760 hours. HbA1C: No results for input(s): HGBA1C in the last 72 hours. CBG: Recent Labs  Lab 02/16/21 1146 02/16/21 1605 02/16/21 2110 02/17/21 0602 02/17/21 1113  GLUCAP 205* 149* 207* 155* 198*   Lipid Profile: No results for input(s): CHOL, HDL, LDLCALC, TRIG, CHOLHDL, LDLDIRECT in the last 72 hours. Thyroid Function Tests: No results for input(s): TSH, T4TOTAL, FREET4, T3FREE, THYROIDAB in the last 72 hours. Anemia Panel: No results for input(s): VITAMINB12, FOLATE, FERRITIN, TIBC, IRON, RETICCTPCT in the last 72 hours. Sepsis Labs: Recent Labs  Lab 02/15/21 1835  LATICACIDVEN 1.4    Recent Results (from the past 240 hour(s))  Resp Panel  by RT-PCR (Flu A&B, Covid) Nasopharyngeal Swab     Status: None   Collection Time: 02/08/21  7:50 PM   Specimen: Nasopharyngeal Swab; Nasopharyngeal(NP) swabs in vial transport medium  Result Value Ref Range Status   SARS Coronavirus 2 by RT PCR NEGATIVE NEGATIVE Final    Comment: (NOTE) SARS-CoV-2 target nucleic acids are NOT DETECTED.  The SARS-CoV-2 RNA is generally detectable in upper respiratory specimens during the acute phase of infection. The lowest concentration of SARS-CoV-2 viral copies this assay can detect is 138 copies/mL. A negative result does not preclude SARS-Cov-2 infection and should not be used as the sole basis for treatment or other patient management  decisions. A negative result may occur with  improper specimen collection/handling, submission of specimen other than nasopharyngeal swab, presence of viral mutation(s) within the areas targeted by this assay, and inadequate number of viral copies(<138 copies/mL). A negative result must be combined with clinical observations, patient history, and epidemiological information. The expected result is Negative.  Fact Sheet for Patients:  BloggerCourse.comhttps://www.fda.gov/media/152166/download  Fact Sheet for Healthcare Providers:  SeriousBroker.ithttps://www.fda.gov/media/152162/download  This test is no t yet approved or cleared by the [place] FDA and  has been authorized for detection and/or diagnosis of SARS-CoV-2 by FDA under an Emergency Use Authorization (EUA). This EUA will remain  in effect (meaning this test can be used) for the duration of the COVID-19 declaration under Section 564(b)(1) of the Act, 21 U.S.C.section 360bbb-3(b)(1), unless the authorization is terminated  or revoked sooner.       Influenza A by PCR NEGATIVE NEGATIVE Final   Influenza B by PCR NEGATIVE NEGATIVE Final    Comment: (NOTE) The Xpert Xpress SARS-CoV-2/FLU/RSV plus assay is intended as an aid in the diagnosis of influenza from Nasopharyngeal swab  specimens and should not be used as a sole basis for treatment. Nasal washings and aspirates are unacceptable for Xpert Xpress SARS-CoV-2/FLU/RSV testing.  Fact Sheet for Patients: BloggerCourse.comhttps://www.fda.gov/media/152166/download  Fact Sheet for Healthcare Providers: SeriousBroker.ithttps://www.fda.gov/media/152162/download  This test is not yet approved or cleared by the [place] FDA and has been authorized for detection and/or diagnosis of SARS-CoV-2 by FDA under an Emergency Use Authorization (EUA). This EUA will remain in effect (meaning this test can be used) for the duration of the COVID-19 declaration under Section 564(b)(1) of the Act, 21 U.S.C. section 360bbb-3(b)(1), unless the authorization is terminated or revoked.  Performed at [facility], [address] [place], [place] [address]   Urine Culture     Status: Abnormal   Collection Time: 02/08/21  9:21 PM   Specimen: Urine, Clean Catch  Result Value Ref Range Status   Specimen Description URINE, CLEAN CATCH  Final   Special Requests   Final    NONE Performed at [facility], [address] [place], [place] [address]    Culture MULTIPLE SPECIES PRESENT, SUGGEST RECOLLECTION (A)  Final   Report Status 02/10/2021 FINAL  Final  Blood culture (routine x 2)     Status: None (Preliminary result)   Collection Time: 02/15/21  6:35 PM   Specimen: BLOOD  Result Value Ref Range Status   Specimen Description BLOOD SITE NOT SPECIFIED  Final   Special Requests   Final    BOTTLES DRAWN AEROBIC AND ANAEROBIC Blood Culture results may not be optimal due to an inadequate volume of blood received in culture bottles   Culture   Final    NO GROWTH < 24 HOURS Performed at [facility], [address] [place], [place] [address]    Report Status PENDING  Incomplete  Blood culture (routine x 2)     Status: None (Preliminary result)   Collection Time: 02/15/21  6:40 PM   Specimen: BLOOD RIGHT HAND  Result Value Ref Range Status    Specimen Description BLOOD RIGHT HAND  Final   Special Requests   Final    BOTTLES DRAWN AEROBIC AND ANAEROBIC Blood Culture results may not be optimal due to an inadequate volume of blood received in culture bottles   Culture   Final    NO GROWTH < 24 HOURS Performed at [facility], [address], [place], [place] [address]    Report Status PENDING  Incomplete  SARS CORONAVIRUS 2 (TAT 6-24 HRS) Nasopharyngeal Nasopharyngeal Swab     Status: None   Collection Time: 02/15/21 10:30 PM   Specimen: Nasopharyngeal Swab  Result Value Ref Range Status   SARS Coronavirus 2 NEGATIVE NEGATIVE Final    Comment: (NOTE) SARS-CoV-2 target nucleic acids are NOT DETECTED.  The SARS-CoV-2 RNA is generally detectable in upper and lower respiratory specimens during the acute phase of infection. Negative results do not preclude SARS-CoV-2 infection, do not rule out co-infections with other pathogens, and should not be used as the sole basis for treatment or other patient management decisions. Negative results must be combined with clinical observations, patient history, and epidemiological information. The expected result is Negative.  Fact Sheet for Patients: HairSlick.no  Fact Sheet for Healthcare Providers: quierodirigir.com  This test is not yet approved or cleared by the [place] FDA and  has been authorized for detection and/or diagnosis of SARS-CoV-2 by FDA under an Emergency Use Authorization (EUA). This EUA will remain  in effect (meaning this test can be used) for the duration of the COVID-19 declaration under Se ction 564(b)(1) of the Act, 21 U.S.C. section 360bbb-3(b)(1), unless the authorization is terminated or revoked sooner.  Performed at [facility], [address], [place], [place] [address]   MRSA Next Gen by PCR, Nasal     Status: None   Collection Time: 02/16/21  3:12 AM   Specimen: Nasal Mucosa;  Nasal Swab  Result Value Ref Range Status   MRSA by PCR Next Gen NOT DETECTED NOT DETECTED Final    Comment: (NOTE) The GeneXpert MRSA Assay (FDA approved for NASAL specimens only), is one component of a comprehensive MRSA colonization surveillance program. It is not intended to diagnose MRSA infection nor to guide or monitor treatment for MRSA infections. Test performance is not FDA approved in patients less than 44 years old. Performed at [facility], [address] [place], [place] [address]          Radiology Studies: DG Chest 1 View  Result Date: 02/15/2021 CLINICAL DATA:  Fall EXAM: CHEST  1 VIEW COMPARISON:  02/08/2021 FINDINGS: The heart size and mediastinal contours are within normal limits. Both lungs are clear. The visualized skeletal structures are unremarkable. IMPRESSION: No active disease. Electronically Signed   By: Marlan Palau M.D.   On: 02/15/2021 17:36   DG Pelvis 1-2 Views  Result Date: 02/15/2021 CLINICAL DATA:  Fall EXAM: PELVIS - 1-2 VIEW COMPARISON:  None. FINDINGS: Negative for pelvic fracture. Calcification in the iliac and femoral arteries bilaterally. IMPRESSION: Negative for pelvic fracture. Electronically Signed   By: Marlan Palau M.D.   On: 02/15/2021 17:37   DG Forearm Right  Result Date: 02/15/2021 CLINICAL DATA:  Fall EXAM: RIGHT FOREARM - 2 VIEW COMPARISON:  None. FINDINGS: Negative for acute fracture. No significant arthropathy. Ossicle versus chronic fracture of the tip of the ulnar styloid. IMPRESSION: Negative for acute fracture. Electronically Signed   By: Marlan Palau M.D.   On: 02/15/2021 17:38   CT Head Wo Contrast  Result Date: 02/15/2021 CLINICAL DATA:  Fall.  Facial trauma EXAM: CT HEAD WITHOUT CONTRAST CT CERVICAL SPINE WITHOUT CONTRAST TECHNIQUE: Multidetector CT imaging of the head and cervical spine was performed following the standard protocol without intravenous contrast. Multiplanar CT image reconstructions of the  cervical spine were also generated. COMPARISON:  CT head 02/08/2021 FINDINGS: CT HEAD FINDINGS  Brain: Mild atrophy. Mild to moderate chronic white matter changes. Negative for acute infarct, hemorrhage, mass Vascular: Negative for hyperdense vessel Skull: Negative Sinuses/Orbits: Mild mucosal edema paranasal sinuses. Negative orbit Other: None CT CERVICAL SPINE FINDINGS Alignment: Mild anterolisthesis C3-4. Skull base and vertebrae: Negative for fracture Soft tissues and spinal canal: Atherosclerotic disease. Right carotid stent noted. Disc levels: Disc degeneration and spurring throughout the cervical spine most prominent C4-5 and C5-6. Upper chest: Lung apices clear bilaterally. Other: None IMPRESSION: 1. No acute intracranial abnormality 2. Negative for cervical spine fracture. Electronically Signed   By: Marlan Palau M.D.   On: 02/15/2021 19:27   CT Cervical Spine Wo Contrast  Result Date: 02/15/2021 CLINICAL DATA:  Fall.  Facial trauma EXAM: CT HEAD WITHOUT CONTRAST CT CERVICAL SPINE WITHOUT CONTRAST TECHNIQUE: Multidetector CT imaging of the head and cervical spine was performed following the standard protocol without intravenous contrast. Multiplanar CT image reconstructions of the cervical spine were also generated. COMPARISON:  CT head 02/08/2021 FINDINGS: CT HEAD FINDINGS Brain: Mild atrophy. Mild to moderate chronic white matter changes. Negative for acute infarct, hemorrhage, mass Vascular: Negative for hyperdense vessel Skull: Negative Sinuses/Orbits: Mild mucosal edema paranasal sinuses. Negative orbit Other: None CT CERVICAL SPINE FINDINGS Alignment: Mild anterolisthesis C3-4. Skull base and vertebrae: Negative for fracture Soft tissues and spinal canal: Atherosclerotic disease. Right carotid stent noted. Disc levels: Disc degeneration and spurring throughout the cervical spine most prominent C4-5 and C5-6. Upper chest: Lung apices clear bilaterally. Other: None IMPRESSION: 1. No acute  intracranial abnormality 2. Negative for cervical spine fracture. Electronically Signed   By: Marlan Palau M.D.   On: 02/15/2021 19:27        Scheduled Meds:  amLODipine  10 mg Oral QPM   aspirin  325 mg Oral Daily   atorvastatin  20 mg Oral QPM   cloNIDine  0.1 mg Oral BID   clopidogrel  75 mg Oral QPM   enoxaparin (LOVENOX) injection  40 mg Subcutaneous Daily   fluconazole  100 mg Oral Daily   insulin aspart  0-5 Units Subcutaneous QHS   insulin aspart  0-9 Units Subcutaneous TID WC   levothyroxine  88 mcg Oral Daily   losartan  50 mg Oral Q supper   magnesium oxide  400 mg Oral BID   metoprolol tartrate  12.5 mg Oral BID   sertraline  100 mg Oral Daily   tamsulosin  0.4 mg Oral Daily   Continuous Infusions:  sodium chloride 75 mL/hr at 02/17/21 1214   cefTRIAXone (ROCEPHIN)  IV Stopped (02/17/21 1350)     LOS: 0 days    Time spent: 35 minutes.     Alba Cory, MD [facility]   If 7PM-7AM, please contact night-coverage [email or web address]  02/17/2021, 2:23 PM

## 2021-02-18 DIAGNOSIS — S5011XA Contusion of right forearm, initial encounter: Secondary | ICD-10-CM | POA: Diagnosis not present

## 2021-02-18 DIAGNOSIS — R55 Syncope and collapse: Secondary | ICD-10-CM | POA: Diagnosis not present

## 2021-02-18 LAB — RESP PANEL BY RT-PCR (FLU A&B, COVID) ARPGX2
Influenza A by PCR: NEGATIVE
Influenza B by PCR: NEGATIVE
SARS Coronavirus 2 by RT PCR: NEGATIVE

## 2021-02-18 LAB — GLUCOSE, CAPILLARY
Glucose-Capillary: 162 mg/dL — ABNORMAL HIGH (ref 70–99)
Glucose-Capillary: 193 mg/dL — ABNORMAL HIGH (ref 70–99)

## 2021-02-18 LAB — URINE CULTURE: Culture: NO GROWTH

## 2021-02-18 MED ORDER — LOSARTAN POTASSIUM 100 MG PO TABS: 100.0000 mg | ORAL_TABLET | Freq: Every day | ORAL | 0 refills | Status: AC

## 2021-02-18 MED ORDER — POLYETHYLENE GLYCOL 3350 17 G PO PACK
17.0000 g | PACK | Freq: Every day | ORAL | Status: DC
  Administered 2021-02-18: 17 g via ORAL
  Filled 2021-02-18: qty 1

## 2021-02-18 MED ORDER — LOSARTAN POTASSIUM 50 MG PO TABS
100.0000 mg | ORAL_TABLET | Freq: Every day | ORAL | Status: DC
  Administered 2021-02-18: 100 mg via ORAL
  Filled 2021-02-18: qty 2

## 2021-02-18 MED ORDER — TAMSULOSIN HCL 0.4 MG PO CAPS: 0.4000 mg | ORAL_CAPSULE | Freq: Every day | ORAL | 0 refills | Status: AC

## 2021-02-18 NOTE — Discharge Summary (Signed)
Physician Discharge Summary  Jo FlashManda Lui ZOX:[id number] DOB: 09/17/1931 DOA: 02/15/2021  PCP: Macy MisBriscoe, Kim K, MD  Admit date: 02/15/2021 Discharge date: 02/18/2021  Admitted From: SNF Disposition:  SNF  Recommendations for Outpatient Follow-up:  Follow up with PCP in 1-2 weeks Please obtain BMP/CBC in one week Please follow up on the following pending results: Please follow urine culture results.  Needs to follow up with IR post R ICA  stent placement.  Further adjustment of BP medications    Home Health:None  Discharge Condition:Stable.  CODE STATUS:Full Code Diet recommendation: Heart Healthy   Brief/Interim Summary: 87 year old with past medical history significant for hypertension, diabetes, hypothyroidism who presented from a skilled nursing memory unit by EMS after a fall.  She was found on the floor near her bed and is not sure if she passed out.  She is not able to provide any history secondary to her dementia.  There is no report of any chest pain. Elevation in the ED patient was noted to have mild elevation of troponin, CT head was negative for acute pathology.  Patient is at baseline according to her daughter.  1-Syncope: Unclear if patient passed out. CT head negative. Her metoprolol was reduced due to bradycardia. No further episode in the hospital  2-Elevation of troponin: Mildly,  flat.  I discussed with cardiology who recommend no further cardiac evaluation Elevation of troponin could be related to high blood pressure.   3-Essential hypertension: Continue with home medication.   Metoprolol stop due to bradycardia.  Norvasc increased to 10 mg daily.  Increase cozaar to 100 mg. Continue with clonidine.   4-Presume UTI. Leukocytosis:  Chest x-ray negative, UA: rare bacteria, small Hb.  urine culture pending.  Patient complaint of dysuria.  Treated with IV ceftriaxone.  She will be discharged on Keflex for 3 more days.  White blood cell decrease from  17-13--9 Started on Flamax, Develop also urine retention 7/28, required In and Out cath yielding 800 cc.  She has been able to urinate.  She has small multiples papules in her labia, evaluated by GYN, this are benign epidermal cyst. No treatment required.    5-Acute metabolic encephalopathy; she was alert to person only initially. Today she is alert to person and place. She was able to tell me her age.  Treating for UTI. Improved.   6-Recent Stroke, Right fronto Parietal CVA:  01/03/2021 S/P Stent of Right ICA 12/2020 Needs to be on dual antiplatelet therapy Aspirin and plavix for 3 months, needs to follow up with IR (Dr Dwyane Luode macedo Rodriguz) for further recommendation of anticoagulation or neurology after 3 months. Needs to follow up with Stroke team.    Chronic dementia   Discharge Diagnoses:  Principal Problem:   Syncope Active Problems:   Type 2 diabetes mellitus without complication, without long-term current use of insulin (HCC)   Essential hypertension   Leucocytosis   Dementia (HCC)   Elevated troponin level    Discharge Instructions  Discharge Instructions     Diet - low sodium heart healthy   Complete by: As directed    Diet - low sodium heart healthy   Complete by: As directed    Diet - low sodium heart healthy   Complete by: As directed    Increase activity slowly   Complete by: As directed    Increase activity slowly   Complete by: As directed    Increase activity slowly   Complete by: As directed  Allergies as of 02/18/2021       Reactions   Clindamycin/lincomycin Swelling, Other (See Comments)   Tongue swelling, angioedema   Peanut Butter Flavor Shortness Of Breath, Other (See Comments)   Swelling and airway closes   Peanut Oil Shortness Of Breath, Other (See Comments)   Swelling and airway closes        Medication List     STOP taking these medications    metoprolol tartrate 25 MG tablet Commonly known as: LOPRESSOR       TAKE  these medications    acetaminophen 325 MG tablet Commonly known as: TYLENOL Take 2 tablets (650 mg total) by mouth 4 (four) times daily -  before meals and at bedtime.   amLODipine 10 MG tablet Commonly known as: NORVASC Take 1 tablet (10 mg total) by mouth every evening. What changed:  medication strength how much to take when to take this   aspirin 325 MG EC tablet Take 1 tablet (325 mg total) by mouth daily.   atorvastatin 20 MG tablet Commonly known as: LIPITOR Take 1 tablet (20 mg total) by mouth daily. What changed: when to take this   cephALEXin 500 MG capsule Commonly known as: KEFLEX Take 1 capsule (500 mg total) by mouth 3 (three) times daily for 3 days.   cloNIDine 0.1 MG tablet Commonly known as: CATAPRES Take 1 tablet (0.1 mg total) by mouth 2 (two) times daily.   clopidogrel 75 MG tablet Commonly known as: PLAVIX Take 1 tablet (75 mg total) by mouth daily. What changed: when to take this   diclofenac Sodium 1 % Gel Commonly known as: VOLTAREN Apply 2 g topically 4 (four) times daily. To left wrist   glimepiride 2 MG tablet Commonly known as: AMARYL Take 1 tablet (2 mg total) by mouth daily.   levothyroxine 88 MCG tablet Commonly known as: SYNTHROID Take 1 tablet (88 mcg total) by mouth daily.   losartan 100 MG tablet Commonly known as: COZAAR Take 1 tablet (100 mg total) by mouth daily. Start taking on: February 19, 2021 What changed:  medication strength how much to take when to take this   magnesium oxide 400 (240 Mg) MG tablet Commonly known as: MAG-OX Take 1 tablet (400 mg total) by mouth 2 (two) times daily.   metFORMIN 1000 MG tablet Commonly known as: GLUCOPHAGE Take 1 tablet (1,000 mg total) by mouth 2 (two) times daily with a meal.   pantoprazole 40 MG tablet Commonly known as: PROTONIX Take 1 tablet (40 mg total) by mouth daily.   sertraline 100 MG tablet Commonly known as: ZOLOFT Take 1 tablet (100 mg total) by mouth daily.    tamsulosin 0.4 MG Caps capsule Commonly known as: FLOMAX Take 1 capsule (0.4 mg total) by mouth daily. Start taking on: February 19, 2021        Allergies  Allergen Reactions   Clindamycin/Lincomycin Swelling and Other (See Comments)    Tongue swelling, angioedema   Peanut Butter Flavor Shortness Of Breath and Other (See Comments)    Swelling and airway closes   Peanut Oil Shortness Of Breath and Other (See Comments)    Swelling and airway closes     Consultations: cardiology   Procedures/Studies: DG Chest 1 View  Result Date: 02/15/2021 CLINICAL DATA:  Fall EXAM: CHEST  1 VIEW COMPARISON:  02/08/2021 FINDINGS: The heart size and mediastinal contours are within normal limits. Both lungs are clear. The visualized skeletal structures are unremarkable. IMPRESSION: No active disease.  Electronically Signed   By: Marlan Palau M.D.   On: 02/15/2021 17:36   DG Pelvis 1-2 Views  Result Date: 02/15/2021 CLINICAL DATA:  Fall EXAM: PELVIS - 1-2 VIEW COMPARISON:  None. FINDINGS: Negative for pelvic fracture. Calcification in the iliac and femoral arteries bilaterally. IMPRESSION: Negative for pelvic fracture. Electronically Signed   By: Marlan Palau M.D.   On: 02/15/2021 17:37   DG Forearm Right  Result Date: 02/15/2021 CLINICAL DATA:  Fall EXAM: RIGHT FOREARM - 2 VIEW COMPARISON:  None. FINDINGS: Negative for acute fracture. No significant arthropathy. Ossicle versus chronic fracture of the tip of the ulnar styloid. IMPRESSION: Negative for acute fracture. Electronically Signed   By: Marlan Palau M.D.   On: 02/15/2021 17:38   CT Head Wo Contrast  Result Date: 02/15/2021 CLINICAL DATA:  Fall.  Facial trauma EXAM: CT HEAD WITHOUT CONTRAST CT CERVICAL SPINE WITHOUT CONTRAST TECHNIQUE: Multidetector CT imaging of the head and cervical spine was performed following the standard protocol without intravenous contrast. Multiplanar CT image reconstructions of the cervical spine were also  generated. COMPARISON:  CT head 02/08/2021 FINDINGS: CT HEAD FINDINGS Brain: Mild atrophy. Mild to moderate chronic white matter changes. Negative for acute infarct, hemorrhage, mass Vascular: Negative for hyperdense vessel Skull: Negative Sinuses/Orbits: Mild mucosal edema paranasal sinuses. Negative orbit Other: None CT CERVICAL SPINE FINDINGS Alignment: Mild anterolisthesis C3-4. Skull base and vertebrae: Negative for fracture Soft tissues and spinal canal: Atherosclerotic disease. Right carotid stent noted. Disc levels: Disc degeneration and spurring throughout the cervical spine most prominent C4-5 and C5-6. Upper chest: Lung apices clear bilaterally. Other: None IMPRESSION: 1. No acute intracranial abnormality 2. Negative for cervical spine fracture. Electronically Signed   By: Marlan Palau M.D.   On: 02/15/2021 19:27   CT Head Wo Contrast  Result Date: 02/08/2021 CLINICAL DATA:  Confusion EXAM: CT HEAD WITHOUT CONTRAST TECHNIQUE: Contiguous axial images were obtained from the base of the skull through the vertex without intravenous contrast. COMPARISON:  None. FINDINGS: Brain: Sequela of prior infarct in the right posterior frontoparietal region (series 3/image 22). No new/acute infarction. No hemorrhage, hydrocephalus, extra-axial collection, or mass lesion/mass effect. Mild cortical and central atrophy. Subcortical white matter and periventricular small vessel ischemic changes. Vascular: Intracranial atherosclerosis. Skull: Normal. Negative for fracture or focal lesion. Sinuses/Orbits: The visualized paranasal sinuses are essentially clear. The mastoid air cells are unopacified. Other: None. IMPRESSION: Sequela of prior infarct in the right posterior frontoparietal region. No evidence of acute intracranial abnormality. Atrophy with small vessel ischemic changes. Electronically Signed   By: Charline Bills M.D.   On: 02/08/2021 20:21   CT Cervical Spine Wo Contrast  Result Date:  02/15/2021 CLINICAL DATA:  Fall.  Facial trauma EXAM: CT HEAD WITHOUT CONTRAST CT CERVICAL SPINE WITHOUT CONTRAST TECHNIQUE: Multidetector CT imaging of the head and cervical spine was performed following the standard protocol without intravenous contrast. Multiplanar CT image reconstructions of the cervical spine were also generated. COMPARISON:  CT head 02/08/2021 FINDINGS: CT HEAD FINDINGS Brain: Mild atrophy. Mild to moderate chronic white matter changes. Negative for acute infarct, hemorrhage, mass Vascular: Negative for hyperdense vessel Skull: Negative Sinuses/Orbits: Mild mucosal edema paranasal sinuses. Negative orbit Other: None CT CERVICAL SPINE FINDINGS Alignment: Mild anterolisthesis C3-4. Skull base and vertebrae: Negative for fracture Soft tissues and spinal canal: Atherosclerotic disease. Right carotid stent noted. Disc levels: Disc degeneration and spurring throughout the cervical spine most prominent C4-5 and C5-6. Upper chest: Lung apices clear bilaterally. Other: None IMPRESSION: 1.  No acute intracranial abnormality 2. Negative for cervical spine fracture. Electronically Signed   By: Marlan Palau M.D.   On: 02/15/2021 19:27   DG Chest Portable 1 View  Result Date: 02/08/2021 CLINICAL DATA:  Recent CVA, left-sided weakness, intermittent confusion EXAM: PORTABLE CHEST 1 VIEW COMPARISON:  01/05/2021 FINDINGS: Single frontal view of the chest demonstrates an unremarkable cardiac silhouette. No airspace disease, effusion, or pneumothorax. No acute bony abnormality. IMPRESSION: 1. No acute intrathoracic process. Electronically Signed   By: Sharlet Salina M.D.   On: 02/08/2021 21:14     Subjective: She is alert, use translator. She report dysuria. Denies chest pain.   Discharge Exam: Vitals:   02/18/21 0801 02/18/21 1137  BP: (!) 168/70 (!) 172/63  Pulse: 65 (!) 48  Resp:  18  Temp:  98.5 F (36.9 C)  SpO2: 97% 100%     General: Pt is alert, awake, not in acute  distress Cardiovascular: RRR, S1/S2 +, no rubs, no gallops Respiratory: CTA bilaterally, no wheezing, no rhonchi Abdominal: Soft, NT, ND, bowel sounds + Extremities: no edema, no cyanosis    The results of significant diagnostics from this hospitalization (including imaging, microbiology, ancillary and laboratory) are listed below for reference.     Microbiology: Recent Results (from the past 240 hour(s))  Resp Panel by RT-PCR (Flu A&B, Covid) Nasopharyngeal Swab     Status: None   Collection Time: 02/08/21  7:50 PM   Specimen: Nasopharyngeal Swab; Nasopharyngeal(NP) swabs in vial transport medium  Result Value Ref Range Status   SARS Coronavirus 2 by RT PCR NEGATIVE NEGATIVE Final    Comment: (NOTE) SARS-CoV-2 target nucleic acids are NOT DETECTED.  The SARS-CoV-2 RNA is generally detectable in upper respiratory specimens during the acute phase of infection. The lowest concentration of SARS-CoV-2 viral copies this assay can detect is 138 copies/mL. A negative result does not preclude SARS-Cov-2 infection and should not be used as the sole basis for treatment or other patient management decisions. A negative result may occur with  improper specimen collection/handling, submission of specimen other than nasopharyngeal swab, presence of viral mutation(s) within the areas targeted by this assay, and inadequate number of viral copies(<138 copies/mL). A negative result must be combined with clinical observations, patient history, and epidemiological information. The expected result is Negative.  Fact Sheet for Patients:  BloggerCourse.com  Fact Sheet for Healthcare Providers:  SeriousBroker.it  This test is no t yet approved or cleared by the [place] FDA and  has been authorized for detection and/or diagnosis of SARS-CoV-2 by FDA under an Emergency Use Authorization (EUA). This EUA will remain  in effect (meaning this test  can be used) for the duration of the COVID-19 declaration under Section 564(b)(1) of the Act, 21 U.S.C.section 360bbb-3(b)(1), unless the authorization is terminated  or revoked sooner.       Influenza A by PCR NEGATIVE NEGATIVE Final   Influenza B by PCR NEGATIVE NEGATIVE Final    Comment: (NOTE) The Xpert Xpress SARS-CoV-2/FLU/RSV plus assay is intended as an aid in the diagnosis of influenza from Nasopharyngeal swab specimens and should not be used as a sole basis for treatment. Nasal washings and aspirates are unacceptable for Xpert Xpress SARS-CoV-2/FLU/RSV testing.  Fact Sheet for Patients: BloggerCourse.com  Fact Sheet for Healthcare Providers: SeriousBroker.it  This test is not yet approved or cleared by the [place] FDA and has been authorized for detection and/or diagnosis of SARS-CoV-2 by FDA under an Emergency Use Authorization (EUA). This EUA will  remain in effect (meaning this test can be used) for the duration of the COVID-19 declaration under Section 564(b)(1) of the Act, 21 U.S.C. section 360bbb-3(b)(1), unless the authorization is terminated or revoked.  Performed at [facility], [address] [place], [place] [address]   Urine Culture     Status: Abnormal   Collection Time: 02/08/21  9:21 PM   Specimen: Urine, Clean Catch  Result Value Ref Range Status   Specimen Description URINE, CLEAN CATCH  Final   Special Requests   Final    NONE Performed at [facility], [address] [place], [place] [address]    Culture MULTIPLE SPECIES PRESENT, SUGGEST RECOLLECTION (A)  Final   Report Status 02/10/2021 FINAL  Final  Urine Culture     Status: None   Collection Time: 02/15/21  6:16 PM   Specimen: Urine, Suprapubic  Result Value Ref Range Status   Specimen Description URINE, SUPRAPUBIC  Final   Special Requests NONE  Final   Culture   Final    NO GROWTH Performed at [facility], [address] [place], [place] [address]    Report Status 02/18/2021 FINAL  Final  Blood culture (routine x 2)     Status: None (Preliminary result)   Collection Time: 02/15/21  6:35 PM   Specimen: BLOOD  Result Value Ref Range Status   Specimen Description BLOOD SITE NOT SPECIFIED  Final   Special Requests   Final    BOTTLES DRAWN AEROBIC AND ANAEROBIC Blood Culture results may not be optimal due to an inadequate volume of blood received in culture bottles   Culture   Final    NO GROWTH < 24 HOURS Performed at [facility], [address], [place], [place] [address]    Report Status PENDING  Incomplete  Blood culture (routine x 2)     Status: None (Preliminary result)   Collection Time: 02/15/21  6:40 PM   Specimen: BLOOD RIGHT HAND  Result Value Ref Range Status   Specimen Description BLOOD RIGHT HAND  Final   Special Requests   Final    BOTTLES DRAWN AEROBIC AND ANAEROBIC Blood Culture results may not be optimal due to an inadequate volume of blood received in culture bottles   Culture   Final    NO GROWTH < 24 HOURS Performed at [facility], [address] [place], [place] [address]    Report Status PENDING  Incomplete  SARS CORONAVIRUS 2 (TAT 6-24 HRS) Nasopharyngeal Nasopharyngeal Swab     Status: None   Collection Time: 02/15/21 10:30 PM   Specimen: Nasopharyngeal Swab  Result Value Ref Range Status   SARS Coronavirus 2 NEGATIVE NEGATIVE Final    Comment: (NOTE) SARS-CoV-2 target nucleic acids are NOT DETECTED.  The SARS-CoV-2 RNA is generally detectable in upper and lower respiratory specimens during the acute phase of infection. Negative results do not preclude SARS-CoV-2 infection, do not rule out co-infections with other pathogens, and should not be used as the sole basis for treatment or other patient management decisions. Negative results must be combined with clinical observations, patient history, and epidemiological information. The  expected result is Negative.  Fact Sheet for Patients: HairSlick.no  Fact Sheet for Healthcare Providers: quierodirigir.com  This test is not yet approved or cleared by the [place] FDA and  has been authorized for detection and/or diagnosis of SARS-CoV-2 by FDA under an Emergency Use Authorization (EUA). This EUA will  remain  in effect (meaning this test can be used) for the duration of the COVID-19 declaration under Se ction 564(b)(1) of the Act, 21 U.S.C. section 360bbb-3(b)(1), unless the authorization is terminated or revoked sooner.  Performed at [facility], [address], [place], [place] [address]   MRSA Next Gen by PCR, Nasal     Status: None   Collection Time: 02/16/21  3:12 AM   Specimen: Nasal Mucosa; Nasal Swab  Result Value Ref Range Status   MRSA by PCR Next Gen NOT DETECTED NOT DETECTED Final    Comment: (NOTE) The GeneXpert MRSA Assay (FDA approved for NASAL specimens only), is one component of a comprehensive MRSA colonization surveillance program. It is not intended to diagnose MRSA infection nor to guide or monitor treatment for MRSA infections. Test performance is not FDA approved in patients less than 59 years old. Performed at [facility], [address] [place], [place] [address]   Resp Panel by RT-PCR (Flu A&B, Covid) Nasopharyngeal Swab     Status: None   Collection Time: 02/18/21  9:45 AM   Specimen: Nasopharyngeal Swab; Nasopharyngeal(NP) swabs in vial transport medium  Result Value Ref Range Status   SARS Coronavirus 2 by RT PCR NEGATIVE NEGATIVE Final    Comment: (NOTE) SARS-CoV-2 target nucleic acids are NOT DETECTED.  The SARS-CoV-2 RNA is generally detectable in upper respiratory specimens during the acute phase of infection. The lowest concentration of SARS-CoV-2 viral copies this assay can detect is 138 copies/mL. A negative result does not preclude  SARS-Cov-2 infection and should not be used as the sole basis for treatment or other patient management decisions. A negative result may occur with  improper specimen collection/handling, submission of specimen other than nasopharyngeal swab, presence of viral mutation(s) within the areas targeted by this assay, and inadequate number of viral copies(<138 copies/mL). A negative result must be combined with clinical observations, patient history, and epidemiological information. The expected result is Negative.  Fact Sheet for Patients:  BloggerCourse.com  Fact Sheet for Healthcare Providers:  SeriousBroker.it  This test is no t yet approved or cleared by the [place] FDA and  has been authorized for detection and/or diagnosis of SARS-CoV-2 by FDA under an Emergency Use Authorization (EUA). This EUA will remain  in effect (meaning this test can be used) for the duration of the COVID-19 declaration under Section 564(b)(1) of the Act, 21 U.S.C.section 360bbb-3(b)(1), unless the authorization is terminated  or revoked sooner.       Influenza A by PCR NEGATIVE NEGATIVE Final   Influenza B by PCR NEGATIVE NEGATIVE Final    Comment: (NOTE) The Xpert Xpress SARS-CoV-2/FLU/RSV plus assay is intended as an aid in the diagnosis of influenza from Nasopharyngeal swab specimens and should not be used as a sole basis for treatment. Nasal washings and aspirates are unacceptable for Xpert Xpress SARS-CoV-2/FLU/RSV testing.  Fact Sheet for Patients: BloggerCourse.com  Fact Sheet for Healthcare Providers: SeriousBroker.it  This test is not yet approved or cleared by the [place] FDA and has been authorized for detection and/or diagnosis of SARS-CoV-2 by FDA under an Emergency Use Authorization (EUA). This EUA will remain in effect (meaning this test can be used) for the duration of  the COVID-19 declaration under Section 564(b)(1) of the Act, 21 U.S.C. section 360bbb-3(b)(1), unless the authorization is terminated or revoked.  Performed at [facility], [address], [place], [place] [address]      Labs: BNP (last  3 results) No results for input(s): BNP in the last 8760 hours. Basic Metabolic Panel: Recent Labs  Lab 02/15/21 1656 02/15/21 2257 02/16/21 0157 02/17/21 1212  NA 134*  --  134* 139  K 3.9  --  3.4* 3.8  CL 99  --  101 105  CO2 20*  --  22 25  GLUCOSE 218*  --  158* 186*  BUN 28*  --  22 13  CREATININE 1.16* 0.90 0.82 0.79  CALCIUM 9.9  --  9.4 9.3   Liver Function Tests: Recent Labs  Lab 02/15/21 1656  AST 30  ALT 16  ALKPHOS 63  BILITOT 0.9  PROT 7.6  ALBUMIN 3.6   No results for input(s): LIPASE, AMYLASE in the last 168 hours. No results for input(s): AMMONIA in the last 168 hours. CBC: Recent Labs  Lab 02/15/21 1656 02/15/21 2257 02/16/21 0157 02/17/21 0219  WBC 17.2* 13.4* 13.7* 9.0  NEUTROABS 13.6*  --   --   --   HGB 12.1 10.8* 10.8* 9.6*  HCT 38.6 34.0* 34.4* 30.9*  MCV 88.1 87.4 88.7 87.8  PLT 497* 450* 461* 382   Cardiac Enzymes: Recent Labs  Lab 02/15/21 1656  CKTOTAL 156   BNP: Invalid input(s): POCBNP CBG: Recent Labs  Lab 02/17/21 1113 02/17/21 1656 02/17/21 2119 02/18/21 0552 02/18/21 1053  GLUCAP 198* 144* 132* 162* 193*   D-Dimer No results for input(s): DDIMER in the last 72 hours. Hgb A1c No results for input(s): HGBA1C in the last 72 hours. Lipid Profile No results for input(s): CHOL, HDL, LDLCALC, TRIG, CHOLHDL, LDLDIRECT in the last 72 hours. Thyroid function studies No results for input(s): TSH, T4TOTAL, T3FREE, THYROIDAB in the last 72 hours.  Invalid input(s): FREET3 Anemia work up No results for input(s): VITAMINB12, FOLATE, FERRITIN, TIBC, IRON, RETICCTPCT in the last 72 hours. Urinalysis    Component Value Date/Time   COLORURINE YELLOW 02/15/2021 1816    APPEARANCEUR CLEAR 02/15/2021 1816   LABSPEC 1.022 02/15/2021 1816   PHURINE 5.0 02/15/2021 1816   GLUCOSEU 150 (A) 02/15/2021 1816   HGBUR SMALL (A) 02/15/2021 1816   HGBUR negative 05/08/2010 0811   BILIRUBINUR NEGATIVE 02/15/2021 1816   KETONESUR 20 (A) 02/15/2021 1816   PROTEINUR >=300 (A) 02/15/2021 1816   UROBILINOGEN 0.2 05/08/2010 0811   NITRITE NEGATIVE 02/15/2021 1816   LEUKOCYTESUR NEGATIVE 02/15/2021 1816   Sepsis Labs Invalid input(s): PROCALCITONIN,  WBC,  LACTICIDVEN Microbiology Recent Results (from the past 240 hour(s))  Resp Panel by RT-PCR (Flu A&B, Covid) Nasopharyngeal Swab     Status: None   Collection Time: 02/08/21  7:50 PM   Specimen: Nasopharyngeal Swab; Nasopharyngeal(NP) swabs in vial transport medium  Result Value Ref Range Status   SARS Coronavirus 2 by RT PCR NEGATIVE NEGATIVE Final    Comment: (NOTE) SARS-CoV-2 target nucleic acids are NOT DETECTED.  The SARS-CoV-2 RNA is generally detectable in upper respiratory specimens during the acute phase of infection. The lowest concentration of SARS-CoV-2 viral copies this assay can detect is 138 copies/mL. A negative result does not preclude SARS-Cov-2 infection and should not be used as the sole basis for treatment or other patient management decisions. A negative result may occur with  improper specimen collection/handling, submission of specimen other than nasopharyngeal swab, presence of viral mutation(s) within the areas targeted by this assay, and inadequate number of viral copies(<138 copies/mL). A negative result must be combined with clinical observations, patient history, and epidemiological information. The expected result is Negative.  Fact Sheet for Patients:  BloggerCourse.com  Fact Sheet for Healthcare Providers:  SeriousBroker.it  This test is no t yet approved or cleared by the [place] FDA and  has been authorized for  detection and/or diagnosis of SARS-CoV-2 by FDA under an Emergency Use Authorization (EUA). This EUA will remain  in effect (meaning this test can be used) for the duration of the COVID-19 declaration under Section 564(b)(1) of the Act, 21 U.S.C.section 360bbb-3(b)(1), unless the authorization is terminated  or revoked sooner.       Influenza A by PCR NEGATIVE NEGATIVE Final   Influenza B by PCR NEGATIVE NEGATIVE Final    Comment: (NOTE) The Xpert Xpress SARS-CoV-2/FLU/RSV plus assay is intended as an aid in the diagnosis of influenza from Nasopharyngeal swab specimens and should not be used as a sole basis for treatment. Nasal washings and aspirates are unacceptable for Xpert Xpress SARS-CoV-2/FLU/RSV testing.  Fact Sheet for Patients: BloggerCourse.com  Fact Sheet for Healthcare Providers: SeriousBroker.it  This test is not yet approved or cleared by the [place] FDA and has been authorized for detection and/or diagnosis of SARS-CoV-2 by FDA under an Emergency Use Authorization (EUA). This EUA will remain in effect (meaning this test can be used) for the duration of the COVID-19 declaration under Section 564(b)(1) of the Act, 21 U.S.C. section 360bbb-3(b)(1), unless the authorization is terminated or revoked.  Performed at [facility], [address] [place], [place] [address]   Urine Culture     Status: Abnormal   Collection Time: 02/08/21  9:21 PM   Specimen: Urine, Clean Catch  Result Value Ref Range Status   Specimen Description URINE, CLEAN CATCH  Final   Special Requests   Final    NONE Performed at [facility], [address], [place], [place] [address]    Culture MULTIPLE SPECIES PRESENT, SUGGEST RECOLLECTION (A)  Final   Report Status 02/10/2021 FINAL  Final  Urine Culture     Status: None   Collection Time: 02/15/21  6:16 PM   Specimen: Urine, Suprapubic  Result Value Ref Range Status    Specimen Description URINE, SUPRAPUBIC  Final   Special Requests NONE  Final   Culture   Final    NO GROWTH Performed at [facility], [address] [place], [place] [address]    Report Status 02/18/2021 FINAL  Final  Blood culture (routine x 2)     Status: None (Preliminary result)   Collection Time: 02/15/21  6:35 PM   Specimen: BLOOD  Result Value Ref Range Status   Specimen Description BLOOD SITE NOT SPECIFIED  Final   Special Requests   Final    BOTTLES DRAWN AEROBIC AND ANAEROBIC Blood Culture results may not be optimal due to an inadequate volume of blood received in culture bottles   Culture   Final    NO GROWTH < 24 HOURS Performed at [facility], [address], [place], [place] [address]    Report Status PENDING  Incomplete  Blood culture (routine x 2)     Status: None (Preliminary result)   Collection Time: 02/15/21  6:40 PM   Specimen: BLOOD RIGHT HAND  Result Value Ref Range Status   Specimen Description BLOOD RIGHT HAND  Final   Special Requests   Final    BOTTLES DRAWN AEROBIC AND ANAEROBIC Blood Culture results may not be optimal due to an inadequate volume of blood received in culture bottles   Culture  Final    NO GROWTH < 24 HOURS Performed at [facility], [address], [place], [place] [address]    Report Status PENDING  Incomplete  SARS CORONAVIRUS 2 (TAT 6-24 HRS) Nasopharyngeal Nasopharyngeal Swab     Status: None   Collection Time: 02/15/21 10:30 PM   Specimen: Nasopharyngeal Swab  Result Value Ref Range Status   SARS Coronavirus 2 NEGATIVE NEGATIVE Final    Comment: (NOTE) SARS-CoV-2 target nucleic acids are NOT DETECTED.  The SARS-CoV-2 RNA is generally detectable in upper and lower respiratory specimens during the acute phase of infection. Negative results do not preclude SARS-CoV-2 infection, do not rule out co-infections with other pathogens, and should not be used as the sole basis for treatment or other patient  management decisions. Negative results must be combined with clinical observations, patient history, and epidemiological information. The expected result is Negative.  Fact Sheet for Patients: HairSlick.no  Fact Sheet for Healthcare Providers: quierodirigir.com  This test is not yet approved or cleared by the [place] FDA and  has been authorized for detection and/or diagnosis of SARS-CoV-2 by FDA under an Emergency Use Authorization (EUA). This EUA will remain  in effect (meaning this test can be used) for the duration of the COVID-19 declaration under Se ction 564(b)(1) of the Act, 21 U.S.C. section 360bbb-3(b)(1), unless the authorization is terminated or revoked sooner.  Performed at [facility], [address], [place], [place] [address]   MRSA Next Gen by PCR, Nasal     Status: None   Collection Time: 02/16/21  3:12 AM   Specimen: Nasal Mucosa; Nasal Swab  Result Value Ref Range Status   MRSA by PCR Next Gen NOT DETECTED NOT DETECTED Final    Comment: (NOTE) The GeneXpert MRSA Assay (FDA approved for NASAL specimens only), is one component of a comprehensive MRSA colonization surveillance program. It is not intended to diagnose MRSA infection nor to guide or monitor treatment for MRSA infections. Test performance is not FDA approved in patients less than 27 years old. Performed at [facility], [address] [place], [place] [address]   Resp Panel by RT-PCR (Flu A&B, Covid) Nasopharyngeal Swab     Status: None   Collection Time: 02/18/21  9:45 AM   Specimen: Nasopharyngeal Swab; Nasopharyngeal(NP) swabs in vial transport medium  Result Value Ref Range Status   SARS Coronavirus 2 by RT PCR NEGATIVE NEGATIVE Final    Comment: (NOTE) SARS-CoV-2 target nucleic acids are NOT DETECTED.  The SARS-CoV-2 RNA is generally detectable in upper respiratory specimens during the acute phase of infection. The  lowest concentration of SARS-CoV-2 viral copies this assay can detect is 138 copies/mL. A negative result does not preclude SARS-Cov-2 infection and should not be used as the sole basis for treatment or other patient management decisions. A negative result may occur with  improper specimen collection/handling, submission of specimen other than nasopharyngeal swab, presence of viral mutation(s) within the areas targeted by this assay, and inadequate number of viral copies(<138 copies/mL). A negative result must be combined with clinical observations, patient history, and epidemiological information. The expected result is Negative.  Fact Sheet for Patients:  BloggerCourse.com  Fact Sheet for Healthcare Providers:  SeriousBroker.it  This test is no t yet approved or cleared by the [place] FDA and  has been authorized for detection and/or diagnosis of SARS-CoV-2 by FDA under an Emergency Use Authorization (EUA). This EUA will remain  in effect (meaning this  test can be used) for the duration of the COVID-19 declaration under Section 564(b)(1) of the Act, 21 U.S.C.section 360bbb-3(b)(1), unless the authorization is terminated  or revoked sooner.       Influenza A by PCR NEGATIVE NEGATIVE Final   Influenza B by PCR NEGATIVE NEGATIVE Final    Comment: (NOTE) The Xpert Xpress SARS-CoV-2/FLU/RSV plus assay is intended as an aid in the diagnosis of influenza from Nasopharyngeal swab specimens and should not be used as a sole basis for treatment. Nasal washings and aspirates are unacceptable for Xpert Xpress SARS-CoV-2/FLU/RSV testing.  Fact Sheet for Patients: BloggerCourse.com  Fact Sheet for Healthcare Providers: SeriousBroker.it  This test is not yet approved or cleared by the [place] FDA and has been authorized for detection and/or diagnosis of SARS-CoV-2 by FDA under  an Emergency Use Authorization (EUA). This EUA will remain in effect (meaning this test can be used) for the duration of the COVID-19 declaration under Section 564(b)(1) of the Act, 21 U.S.C. section 360bbb-3(b)(1), unless the authorization is terminated or revoked.  Performed at [facility], [address], [place], [place] [address]      Time coordinating discharge: 40 minutes  SIGNED:   Alba Cory, MD  [facility]

## 2021-02-18 NOTE — TOC Transition Note (Signed)
Transition of Care [facility]) - CM/SW Discharge Note   Patient Details  Name: Jo Larson MRN: [id number] Date of Birth: 10/05/1931  [facility]) CM/SW Contact:  Lynett Grimes Phone Number: 02/18/2021, 12:16 PM   Clinical Narrative:    Patient will DC to: [facility] Anticipated DC date: 02/18/2021 Family notified: pt daughter in Development worker, community by: Sharin Mons   Per MD patient ready for DC to [address]. RN to call report prior to discharge [phone]). RN, patient, patient's family, and facility notified of DC. Discharge Summary and FL2 sent to facility. DC packet on chart. Ambulance transport requested for patient.   CSW will sign off for now as social work intervention is no longer needed. Please consult [place] again if new needs arise.     Final next level of care: Skilled Nursing Facility Barriers to Discharge: Barriers Resolved   Patient Goals and CMS Choice Patient states their goals for this hospitalization and ongoing recovery are:: Return to facility CMS Medicare.gov Compare Post Acute Care list provided to:: Patient Represenative (must comment) Choice offered to / list presented to : Adult Children  Discharge Placement                       Discharge Plan and Services In-house Referral: Clinical Social Work   Post Acute Care Choice: Skilled Nursing Facility                               Social Determinants of Health (SDOH) Interventions     Readmission Risk Interventions No flowsheet data found.

## 2021-02-18 NOTE — Progress Notes (Signed)
Pt discharged per MD iv and tele removed report given to ashley at [facility]. Grandson made aware as well

## 2021-02-20 LAB — CULTURE, BLOOD (ROUTINE X 2)
Culture: NO GROWTH
Culture: NO GROWTH

## 2021-03-08 ENCOUNTER — Inpatient Hospital Stay: Payer: Medicare Other | Admitting: Adult Health

## 2021-03-08 NOTE — Progress Notes (Deleted)
[facility] [address] [place] [id number] [phone]       HOSPITAL FOLLOW UP NOTE  Ms. Boykin Jo Maddison Date of Birth:  12/08/1931 Medical Record Number:  [id number]   Reason for Referral:  hospital stroke follow up    SUBJECTIVE:   CHIEF COMPLAINT:  No chief complaint on file.   HPI:   Ms. Boykin Jo Larson is a 87 y.o. female with history of DM2, HTN, HLD, CAD, hypothyroidism who presented on 12/28/20 with left hand grip weakness over the past 2 days.  Personally reviewed hospitalization pertinent progress notes, lab work and imaging.  Evaluated by Dr. Roda ShuttersXu for right frontal parietal lobe stroke including the perirolanic region.  CTA head/neck 80% stenosis right carotid bulb, 60% stenosis supraclinoid left ICA and 65% stenosis of the paraclinoid right ICA. Neuro IR consulted - s/p right carotid stenting 6/14.  EF 60 to 65%.  LDL 75.  A1c 8.8.  Recommended DAPT for 3 months per IR rec's for plans on repeat CUS at that time.  Increase home dose atorvastatin 20 mg to 40 mg daily.  Other stroke risk factors include advanced age, obesity and CAD.  Therapy eval with residual left-sided weakness with sensory deficits, dysphagia, visual deficits and cognitive impairment.  CIR recommended for functional decline. CIR admission 6/16-7/1.      Pertinent imaging 12/30/20 CT Head  New acute or subacute infarct at the high RIGHT posterior frontal lobe. No signs of hemorrhage, mass effect or midline shift. Stable findings of atrophy and chronic microvascular ischemic change.    12/30/20 MRA Head and Neck  MRA HEAD Intracranial internal carotid arteries are patent. Middle and anterior cerebral arteries are patent. Intracranial vertebral arteries, basilar artery, posterior cerebral arteries are patent. Right posterior communicating artery is present. A small left posterior communicating artery may be present. There is no significant stenosis or aneurysm.   MRA NECK Motion  artifact is present. Common, internal, and external carotid arteries are patent. Retropharyngeal course of the internal carotids. Visualized extracranial vertebral arteries are patent and codominant. There is no hemodynamically significant stenosis identified.   12/30/20 MR BRAIN WO CONTRAST Patchy acute infarcts in the right frontoparietal lobes including the perirolandic region. Minimal right occipital involvement as well (right posterior communicating artery is present). Chronic microvascular ischemic changes.   01/02/21 CTA Head and Neck W WO IV Contrast 1. Hypodense foci in the right frontoparietal region corresponding to subacute infarcts seen on prior MRI. No new focal hypodensity to suggest new infarct. 2. Atherosclerotic changes of the right carotid bifurcation resulting in 80% stenosis of the right carotid bulb. 3. Atherosclerotic changes of the left carotid bifurcation without hemodynamically significant stenosis. 4. Atherosclerotic changes of the bilateral intracranial carotid arteries with approximately 60% stenosis of the supraclinoid left ICA and 65% stenosis of the paraclinoid right ICA. 5. Luminal irregularity of the bilateral ACA, MCA and PCA vascular trees suggestive intracranial atherosclerotic disease without hemodynamically significant stenosis. 6. A 1-2 mm outpouching from the left M1/MCA segment, at the origin of a lenticulostriate artery, suggestive of small aneurysm versus infundibulum.    ROS:   14 system review of systems performed and negative with exception of ***  PMH:  Past Medical History:  Diagnosis Date   Abnormal chest x-ray    Angioedema    a. 05/2014 - felt to be 2/2 clindamycin therapy.   Anxiety    CAD (coronary artery disease)    a. 07/2014 NSTEMI/PCI: LM nl, LAD min irregs, LCX 2158m (4.0x18  Xience DES), LPDA 90 (64mm vessel->med Rx), RCA nl, EF 60%.   Diabetes mellitus without complication (HCC)    Hyperlipemia    Hypertension    Hypothyroidism      PSH:  Past Surgical History:  Procedure Laterality Date   INTUBATION NASOTRACHEAL N/A 04/03/2013   Procedure: FIBEROPTIC NASAL-TRACHEAL INTUBATION;  Surgeon: Christia Reading, MD;  Location: [facility] ORS;  Service: ENT;  Laterality: N/A;   IR ANGIO INTRA EXTRACRAN SEL COM CAROTID INNOMINATE BILAT MOD SED  01/03/2021   IR ANGIO VERTEBRAL SEL VERTEBRAL UNI R MOD SED  01/03/2021   IR INTRAVSC STENT CERV CAROTID W/EMB-PROT MOD SED INCL ANGIO  01/03/2021   IR US GUIDE VASC ACCESS RIGHT  01/03/2021   LEFT HEART CATHETERIZATION WITH CORONARY ANGIOGRAM N/A 07/26/2014   Procedure: LEFT HEART CATHETERIZATION WITH CORONARY ANGIOGRAM;  Surgeon: Runell Gess, MD;  Location: [facility] CATH LAB;  Service: Cardiovascular;  Laterality: N/A;   RADIOLOGY WITH ANESTHESIA Right 01/03/2021   Procedure: IR WITH ANESTHESIA - CAROTID STENT;  Surgeon: Baldemar Lenis, MD;  Location: [facility] OR;  Service: Radiology;  Laterality: Right;    Social History:  Social History   Socioeconomic History   Marital status: Married    Spouse name: Not on file   Number of children: 3   Years of education: Not on file   Highest education level: Not on file  Occupational History   Not on file  Tobacco Use   Smoking status: Never   Smokeless tobacco: Never  Vaping Use   Vaping Use: Never used  Substance and Sexual Activity   Alcohol use: No   Drug use: No   Sexual activity: Never  Other Topics Concern   Not on file  Social History Narrative   Lives at home with daughter in law.    Social Determinants of Health   Financial Resource Strain: Not on file  Food Insecurity: Not on file  Transportation Needs: Not on file  Physical Activity: Not on file  Stress: Not on file  Social Connections: Not on file  Intimate Partner Violence: Not on file    Family History:  Family History  Problem Relation Age of Onset   Hypertension Father    Diabetes Father     Medications:   Current Outpatient Medications on File Prior  to Visit  Medication Sig Dispense Refill   acetaminophen (TYLENOL) 325 MG tablet Take 2 tablets (650 mg total) by mouth 4 (four) times daily -  before meals and at bedtime.     amLODipine (NORVASC) 10 MG tablet Take 1 tablet (10 mg total) by mouth every evening. 30 tablet 0   aspirin 325 MG EC tablet Take 1 tablet (325 mg total) by mouth daily. 100 tablet 12   atorvastatin (LIPITOR) 20 MG tablet Take 1 tablet (20 mg total) by mouth daily. 30 tablet 0   cloNIDine (CATAPRES) 0.1 MG tablet Take 1 tablet (0.1 mg total) by mouth 2 (two) times daily. 60 tablet 0   clopidogrel (PLAVIX) 75 MG tablet Take 1 tablet (75 mg total) by mouth daily. 30 tablet 0   diclofenac Sodium (VOLTAREN) 1 % GEL Apply 2 g topically 4 (four) times daily. To left wrist 300 g 0   glimepiride (AMARYL) 2 MG tablet Take 1 tablet (2 mg total) by mouth daily. 30 tablet 0   levothyroxine (SYNTHROID) 88 MCG tablet Take 1 tablet (88 mcg total) by mouth daily. 30 tablet 0   losartan (COZAAR) 100 MG tablet  Take 1 tablet (100 mg total) by mouth daily. 30 tablet 0   magnesium oxide (MAG-OX) 400 (240 Mg) MG tablet Take 1 tablet (400 mg total) by mouth 2 (two) times daily. 60 tablet 0   metFORMIN (GLUCOPHAGE) 1000 MG tablet Take 1 tablet (1,000 mg total) by mouth 2 (two) times daily with a meal. 60 tablet 0   pantoprazole (PROTONIX) 40 MG tablet Take 1 tablet (40 mg total) by mouth daily. 30 tablet 0   sertraline (ZOLOFT) 100 MG tablet Take 1 tablet (100 mg total) by mouth daily. 30 tablet 0   tamsulosin (FLOMAX) 0.4 MG CAPS capsule Take 1 capsule (0.4 mg total) by mouth daily. 30 capsule 0   No current facility-administered medications on file prior to visit.    Allergies:   Allergies  Allergen Reactions   Clindamycin/Lincomycin Swelling and Other (See Comments)    Tongue swelling, angioedema   Peanut Butter Flavor Shortness Of Breath and Other (See Comments)    Swelling and airway closes   Peanut Oil Shortness Of Breath and Other  (See Comments)    Swelling and airway closes       OBJECTIVE:  Physical Exam  There were no vitals filed for this visit. There is no height or weight on file to calculate BMI. No results found.  Depression screen PHQ 2/9 01/31/2021  Decreased Interest 1  Down, Depressed, Hopeless 2  PHQ - 2 Score 3  Altered sleeping 3  Tired, decreased energy 3  Change in appetite 3  Feeling bad or failure about yourself  3  Trouble concentrating 3  Moving slowly or fidgety/restless 3  Suicidal thoughts 0  PHQ-9 Score 21     General: well developed, well nourished, seated, in no evident distress Head: head normocephalic and atraumatic.   Neck: supple with no carotid or supraclavicular bruits Cardiovascular: regular rate and rhythm, no murmurs Musculoskeletal: no deformity Skin:  no rash/petichiae Vascular:  Normal pulses all extremities   Neurologic Exam Mental Status: Awake and fully alert. Oriented to place and time. Recent and remote memory intact. Attention span, concentration and fund of knowledge appropriate. Mood and affect appropriate.  Cranial Nerves: Fundoscopic exam reveals sharp disc margins. Pupils equal, briskly reactive to light. Extraocular movements full without nystagmus. Visual fields full to confrontation. Hearing intact. Facial sensation intact. Face, tongue, palate moves normally and symmetrically.  Motor: Normal bulk and tone. Normal strength in all tested extremity muscles Sensory.: intact to touch , pinprick , position and vibratory sensation.  Coordination: Rapid alternating movements normal in all extremities. Finger-to-nose and heel-to-shin performed accurately bilaterally. Gait and Station: Arises from chair without difficulty. Stance is normal. Gait demonstrates normal stride length and balance with ***. Tandem walk and heel toe ***.  Reflexes: 1+ and symmetric. Toes downgoing.     NIHSS  *** Modified Rankin  ***      ASSESSMENT: Jo Larson is a  87 y.o. year old female with acute right frontoparietal lobe stroke including the perirolandic region on 12/30/2020.  Underwent right carotid stenting 6/14. Vascular risk factors include bilateral carotid stenosis, HTN, HLD, DM and CAD.      PLAN:  Recent ischemic stroke: Residual deficit: ***. Continue aspirin 325 mg daily and clopidogrel 75 mg daily  and atorvastatin 40 mg daily for secondary stroke prevention.  Discussed secondary stroke prevention measures and importance of close PCP follow up for aggressive stroke risk factor management. I have gone over the pathophysiology of stroke, warning signs and symptoms, risk  factors and their management in some detail with instructions to go to the closest emergency room for symptoms of concern. HTN: BP goal <130/90.  Stable on *** per PCP HLD: LDL goal <70. Recent LDL 75 -continue atorvastatin 40 mg daily.  DMII: A1c goal<7.0. Recent A1c 8.8.     Follow up in *** or call earlier if needed   CC:  [facility] provider: Dr. Pearlean Brownie PCP: Macy Mis, MD    I spent *** minutes of face-to-face and non-face-to-face time with patient.  This included previsit chart review, lab review, study review, order entry, electronic health record documentation, patient education regarding recent stroke, residual deficits, importance of managing stroke risk factors and answered all questions to patient satisfaction     Ihor Austin, [facility]  [facility] [address] [place], [place] [address]  Phone [phone] Fax [phone] Note: This document was prepared with digital dictation and possible smart phrase technology. Any transcriptional errors that result from this process are unintentional.

## 2021-03-13 ENCOUNTER — Ambulatory Visit (INDEPENDENT_AMBULATORY_CARE_PROVIDER_SITE_OTHER): Payer: Medicare Other | Admitting: Adult Health

## 2021-03-13 ENCOUNTER — Other Ambulatory Visit: Payer: Self-pay

## 2021-03-13 ENCOUNTER — Encounter: Payer: Self-pay | Admitting: Adult Health

## 2021-03-13 ENCOUNTER — Inpatient Hospital Stay: Payer: Medicare Other | Admitting: Adult Health

## 2021-03-13 VITALS — BP 120/84 | HR 76

## 2021-03-13 DIAGNOSIS — E1165 Type 2 diabetes mellitus with hyperglycemia: Secondary | ICD-10-CM

## 2021-03-13 DIAGNOSIS — I639 Cerebral infarction, unspecified: Secondary | ICD-10-CM

## 2021-03-13 DIAGNOSIS — I6523 Occlusion and stenosis of bilateral carotid arteries: Secondary | ICD-10-CM | POA: Diagnosis not present

## 2021-03-13 DIAGNOSIS — I10 Essential (primary) hypertension: Secondary | ICD-10-CM | POA: Diagnosis not present

## 2021-03-13 DIAGNOSIS — E785 Hyperlipidemia, unspecified: Secondary | ICD-10-CM

## 2021-03-13 NOTE — Progress Notes (Signed)
[facility] [address] [place]. [place] [address] [phone]       HOSPITAL FOLLOW UP NOTE  [name] Date of Birth:  08/13/1931 Medical Record Number:  [id number]   Reason for Referral:  hospital stroke follow up    SUBJECTIVE:   CHIEF COMPLAINT:  Chief Complaint  Patient presents with   Follow-up    Rm 2 with interpreter. He for f/u from Hsp visit. Residing in [place].     HPI:   [name] is a 87 y.o. female with history of DM2, HTN, HLD, CAD, hypothyroidism who presented on 12/28/20 with left hand grip weakness over the past 2 days.  Personally reviewed hospitalization pertinent progress notes, lab work and imaging.  Evaluated by Dr. Roda Shutters for right frontal parietal lobe stroke including the perirolanic region.  CTA head/neck 80% stenosis right carotid bulb, 60% stenosis supraclinoid left ICA and 65% stenosis of the paraclinoid right ICA. Neuro IR consulted - s/p right carotid stenting 6/14.  EF 60 to 65%.  LDL 75.  A1c 8.8.  Recommended DAPT for 3 months per IR rec's for plans on repeat CUS at that time.  Increase home dose atorvastatin 20 mg to 40 mg daily.  Other stroke risk factors include advanced age, obesity and CAD.  Therapy eval with residual left-sided weakness with sensory deficits, dysphagia, visual deficits and cognitive impairment.  CIR recommended for functional decline. CIR admission 6/16-7/1.   Today, 03/13/2021, [name] is being seen for hospital follow-up accompanied by [facility] interpreter. Initially was discharged home from CIR but due to family concerns of caring for her, they brought her to the ED on 02/08/2021 for placement of long-term care. Currently residing at [facility].  History taking limited due to baseline dementia but reports difficulty with ambulation and weakness on the left side.  Unable to verify if currently working with therapies.  Denies swallowing issues but reports having her pills crushed  which she does not like.  She also complains of presence of Foley catheter - per [facility] review, straight cath needed for retention on 8/8 in order to DC Foley 8/22.  Per MAR report, remains on aspirin, plavix and atorvastatin. Blood pressure today 120/84.       Pertinent imaging 12/30/20 CT Head  New acute or subacute infarct at the high RIGHT posterior frontal lobe. No signs of hemorrhage, mass effect or midline shift. Stable findings of atrophy and chronic microvascular ischemic change.    12/30/20 MRA Head and Neck  MRA HEAD Intracranial internal carotid arteries are patent. Middle and anterior cerebral arteries are patent. Intracranial vertebral arteries, basilar artery, posterior cerebral arteries are patent. Right posterior communicating artery is present. A small left posterior communicating artery may be present. There is no significant stenosis or aneurysm.   MRA NECK Motion artifact is present. Common, internal, and external carotid arteries are patent. Retropharyngeal course of the internal carotids. Visualized extracranial vertebral arteries are patent and codominant. There is no hemodynamically significant stenosis identified.   12/30/20 MR BRAIN WO CONTRAST Patchy acute infarcts in the right frontoparietal lobes including the perirolandic region. Minimal right occipital involvement as well (right posterior communicating artery is present). Chronic microvascular ischemic changes.   01/02/21 CTA Head and Neck W WO IV Contrast 1. Hypodense foci in the right frontoparietal region corresponding to subacute infarcts seen on prior MRI. No new focal hypodensity to suggest new infarct. 2. Atherosclerotic changes of the right carotid bifurcation resulting in 80% stenosis of  the right carotid bulb. 3. Atherosclerotic changes of the left carotid bifurcation without hemodynamically significant stenosis. 4. Atherosclerotic changes of the bilateral intracranial carotid arteries with approximately  60% stenosis of the supraclinoid left ICA and 65% stenosis of the paraclinoid right ICA. 5. Luminal irregularity of the bilateral ACA, MCA and PCA vascular trees suggestive intracranial atherosclerotic disease without hemodynamically significant stenosis. 6. A 1-2 mm outpouching from the left M1/MCA segment, at the origin of a lenticulostriate artery, suggestive of small aneurysm versus infundibulum.    ROS:   14 system review of systems performed and negative with exception of those listed in HPI  PMH:  Past Medical History:  Diagnosis Date   Abnormal chest x-ray    Angioedema    a. 05/2014 - felt to be 2/2 clindamycin therapy.   Anxiety    CAD (coronary artery disease)    a. 07/2014 NSTEMI/PCI: LM nl, LAD min irregs, LCX 71m (4.0x18 Xience DES), LPDA 90 (38mm vessel->med Rx), RCA nl, EF 60%.   Diabetes mellitus without complication (HCC)    Hyperlipemia    Hypertension    Hypothyroidism     PSH:  Past Surgical History:  Procedure Laterality Date   INTUBATION NASOTRACHEAL N/A 04/03/2013   Procedure: FIBEROPTIC NASAL-TRACHEAL INTUBATION;  Surgeon: Christia Reading, MD;  Location: [facility] ORS;  Service: ENT;  Laterality: N/A;   IR ANGIO INTRA EXTRACRAN SEL COM CAROTID INNOMINATE BILAT MOD SED  01/03/2021   IR ANGIO VERTEBRAL SEL VERTEBRAL UNI R MOD SED  01/03/2021   IR INTRAVSC STENT CERV CAROTID W/EMB-PROT MOD SED INCL ANGIO  01/03/2021   IR US GUIDE VASC ACCESS RIGHT  01/03/2021   LEFT HEART CATHETERIZATION WITH CORONARY ANGIOGRAM N/A 07/26/2014   Procedure: LEFT HEART CATHETERIZATION WITH CORONARY ANGIOGRAM;  Surgeon: Runell Gess, MD;  Location: [facility] CATH LAB;  Service: Cardiovascular;  Laterality: N/A;   RADIOLOGY WITH ANESTHESIA Right 01/03/2021   Procedure: IR WITH ANESTHESIA - CAROTID STENT;  Surgeon: Baldemar Lenis, MD;  Location: [facility] OR;  Service: Radiology;  Laterality: Right;    Social History:  Social History   Socioeconomic History   Marital status: Married     Spouse name: Not on file   Number of children: 3   Years of education: Not on file   Highest education level: Not on file  Occupational History   Not on file  Tobacco Use   Smoking status: Never   Smokeless tobacco: Never  Vaping Use   Vaping Use: Never used  Substance and Sexual Activity   Alcohol use: No   Drug use: No   Sexual activity: Never  Other Topics Concern   Not on file  Social History Narrative   Lives at home with daughter in law.    Social Determinants of Health   Financial Resource Strain: Not on file  Food Insecurity: Not on file  Transportation Needs: Not on file  Physical Activity: Not on file  Stress: Not on file  Social Connections: Not on file  Intimate Partner Violence: Not on file    Family History:  Family History  Problem Relation Age of Onset   Hypertension Father    Diabetes Father     Medications:   Current Outpatient Medications on File Prior to Visit  Medication Sig Dispense Refill   acetaminophen (TYLENOL) 325 MG tablet Take 2 tablets (650 mg total) by mouth 4 (four) times daily -  before meals and at bedtime.     amLODipine (NORVASC) 10 MG  tablet Take 1 tablet (10 mg total) by mouth every evening. 30 tablet 0   aspirin 325 MG EC tablet Take 1 tablet (325 mg total) by mouth daily. 100 tablet 12   atorvastatin (LIPITOR) 20 MG tablet Take 1 tablet (20 mg total) by mouth daily. 30 tablet 0   cloNIDine (CATAPRES) 0.1 MG tablet Take 1 tablet (0.1 mg total) by mouth 2 (two) times daily. 60 tablet 0   clopidogrel (PLAVIX) 75 MG tablet Take 1 tablet (75 mg total) by mouth daily. 30 tablet 0   diclofenac Sodium (VOLTAREN) 1 % GEL Apply 2 g topically 4 (four) times daily. To left wrist 300 g 0   ferrous sulfate 325 (65 FE) MG EC tablet Take 325 mg by mouth 3 (three) times daily with meals.     glimepiride (AMARYL) 2 MG tablet Take 1 tablet (2 mg total) by mouth daily. 30 tablet 0   LACTOBACILLUS PO Take by mouth.     levothyroxine (SYNTHROID) 88  MCG tablet Take 1 tablet (88 mcg total) by mouth daily. 30 tablet 0   lidocaine 4 % dressing Apply topically.     losartan (COZAAR) 100 MG tablet Take 1 tablet (100 mg total) by mouth daily. 30 tablet 0   magnesium oxide (MAG-OX) 400 (240 Mg) MG tablet Take 1 tablet (400 mg total) by mouth 2 (two) times daily. 60 tablet 0   metFORMIN (GLUCOPHAGE) 1000 MG tablet Take 1 tablet (1,000 mg total) by mouth 2 (two) times daily with a meal. 60 tablet 0   pantoprazole (PROTONIX) 40 MG tablet Take 1 tablet (40 mg total) by mouth daily. 30 tablet 0   SENNA CO by Combination route.     sertraline (ZOLOFT) 100 MG tablet Take 1 tablet (100 mg total) by mouth daily. 30 tablet 0   tamsulosin (FLOMAX) 0.4 MG CAPS capsule Take 1 capsule (0.4 mg total) by mouth daily. 30 capsule 0   No current facility-administered medications on file prior to visit.    Allergies:   Allergies  Allergen Reactions   Clindamycin/Lincomycin Swelling and Other (See Comments)    Tongue swelling, angioedema   Peanut Butter Flavor Shortness Of Breath and Other (See Comments)    Swelling and airway closes   Peanut Oil Shortness Of Breath and Other (See Comments)    Swelling and airway closes       OBJECTIVE:  Physical Exam  Vitals:   03/13/21 1541  BP: 120/84  Pulse: 76  SpO2: 92%   There is no height or weight on file to calculate BMI. No results found.  General: well developed, well nourished, very pleasant elderly female, seated, in no evident distress Head: head normocephalic and atraumatic.   Neck: supple with no carotid or supraclavicular bruits Cardiovascular: regular rate and rhythm, no murmurs Musculoskeletal: no deformity Skin:  no rash/petichiae Vascular:  Normal pulses all extremities   Neurologic Exam Mental Status: Awake and fully alert. [place]iboutiro[place]ian speaking - denies speech and language difficulties. Oriented to self, month and city but disoriented to year, name of office or where she is currently  residing. Recent and remote memory impaired. Attention span, concentration and fund of knowledge impaired. Mood and affect appropriate.  Cranial Nerves: Fundoscopic exam reveals sharp disc margins. Pupils equal, briskly reactive to light. Extraocular movements full without nystagmus. Visual fields full to confrontation. Hearing intact. Facial sensation intact. Face, tongue, palate moves normally and symmetrically.  Motor: Normal strength, bulk and tone right upper and lower extremity. LUE  3/5 deltoid, 3+/5 elbow extension and flexion and handgrip and mild hip flexor weakness and ankle dorsiflexion weakness Sensory.:  Reports decreased light touch sensation left upper and lower extremity Coordination: Rapid alternating movements normal in all extremities except left hand. Finger-to-nose and heel-to-shin difficulty performing due to difficulty understanding directions Gait and Station: Deferred Reflexes: 1+ and symmetric. Toes downgoing.     NIHSS  2 Modified Rankin  3-4      ASSESSMENT: Jo Larson is a 87 y.o. year old female with acute right frontoparietal lobe stroke including the perirolandic region on 12/30/2020.  Underwent right carotid stenting 6/14. Vascular risk factors include bilateral carotid stenosis, HTN, HLD, DM and CAD as well as baseline dementia.      PLAN:  Recent ischemic stroke:  Residual deficit: Mild left hemiparesis with sensory impairment, dysphagia and cognitive impairment worse from  baseline dementia.  Currently residing at [facility] as family had difficulty providing care at home. Unsure if currently working with therapies. May have difficulty participating due to cognitive impairment. Continue aspirin 325 mg daily and clopidogrel 75 mg daily  and atorvastatin 40 mg daily for secondary stroke prevention.  Continue DAPT for total of 3 months (around 9/10) then aspirin alone Discussed secondary stroke prevention measures and importance of close PCP follow  up for aggressive stroke risk factor management. I have gone over the pathophysiology of stroke, warning signs and symptoms, risk factors and their management in some detail with instructions to go to the closest emergency room for symptoms of concern. Carotid stenosis:  s/p right carotid stenting for 80% stenosis L ICA 60% stenosis Needs to schedule f/u with IR for follow up  HTN: BP goal <130/90.  Stable on current regimen per facility HLD: LDL goal <70. Recent LDL 75 -continue atorvastatin 40 mg daily.  DMII: A1c goal<7.0. Recent A1c 8.8.  Routinely monitored by facility    Follow up in 4 months or call earlier if needed    CC:  [facility] provider: Dr. Pearlean Brownie PCP: Karna Dupes, MD    I spent 49 minutes of face-to-face and non-face-to-face time with patient assisted by [facility] health interpreter.  This included previsit chart review including review of recent hospitalization, lab review, study review, electronic health record documentation, attempted patient education regarding recent stroke, etiology, secondary stroke prevention measures and importance of managing stroke risk factors, residual deficits and typical recovery time and answered all questions to patient satisfaction although visit limited due to baseline dementia   Ihor Austin, AGNP-BC  [facility] [address] [place], [place] [address]  Phone [phone] Fax [phone] Note: This document was prepared with digital dictation and possible smart phrase technology. Any transcriptional errors that result from this process are unintentional.

## 2021-03-13 NOTE — Patient Instructions (Addendum)
Continue working with therapies for residual left hemiparesis  Continue aspirin 325 mg daily and clopidogrel 75 mg daily  and atorvastatin 20mg  daily  for secondary stroke prevention  Continue to follow up with PCP regarding cholesterol and blood pressure management  Maintain strict control of hypertension with blood pressure goal below 130/90 and cholesterol with LDL cholesterol (bad cholesterol) goal below 70 mg/dL.   Ensure follow up with IR Dr. post stent placement     Followup in the future with me in 4 months or call earlier if needed       Thank you for coming to see Jo Larson at [facility]. I hope we have been able to provide you high quality care today.  You may receive a patient satisfaction survey over the next few weeks. We would appreciate your feedback and comments so that we may continue to improve ourselves and the health of our patients.

## 2021-03-14 NOTE — Progress Notes (Signed)
I agree with the above plan 

## 2021-05-16 ENCOUNTER — Ambulatory Visit: Payer: Medicare Other | Admitting: Physical Medicine and Rehabilitation

## 2021-05-17 ENCOUNTER — Ambulatory Visit: Payer: Medicare Other | Admitting: Physical Medicine and Rehabilitation

## 2021-05-29 ENCOUNTER — Telehealth (HOSPITAL_COMMUNITY): Payer: Self-pay

## 2021-05-29 NOTE — Telephone Encounter (Signed)
Called to schedule f/u US carotid. Per [name], pt passed away about a month ago. AW

## 2021-05-31 ENCOUNTER — Encounter: Payer: Self-pay | Admitting: Adult Health

## 2021-07-13 ENCOUNTER — Ambulatory Visit: Payer: Medicare Other | Admitting: Adult Health

## 2021-07-20 ENCOUNTER — Ambulatory Visit: Payer: Medicare Other | Admitting: Adult Health

## 2021-11-08 IMAGING — DX DG CHEST 1V PORT
1 series · 1 of 1 positions shown · non-contrast
Comparison: 01/05/2021

CLINICAL DATA: Recent CVA, left-sided weakness, intermittent
confusion

EXAM:
PORTABLE CHEST 1 VIEW

[chest ap]
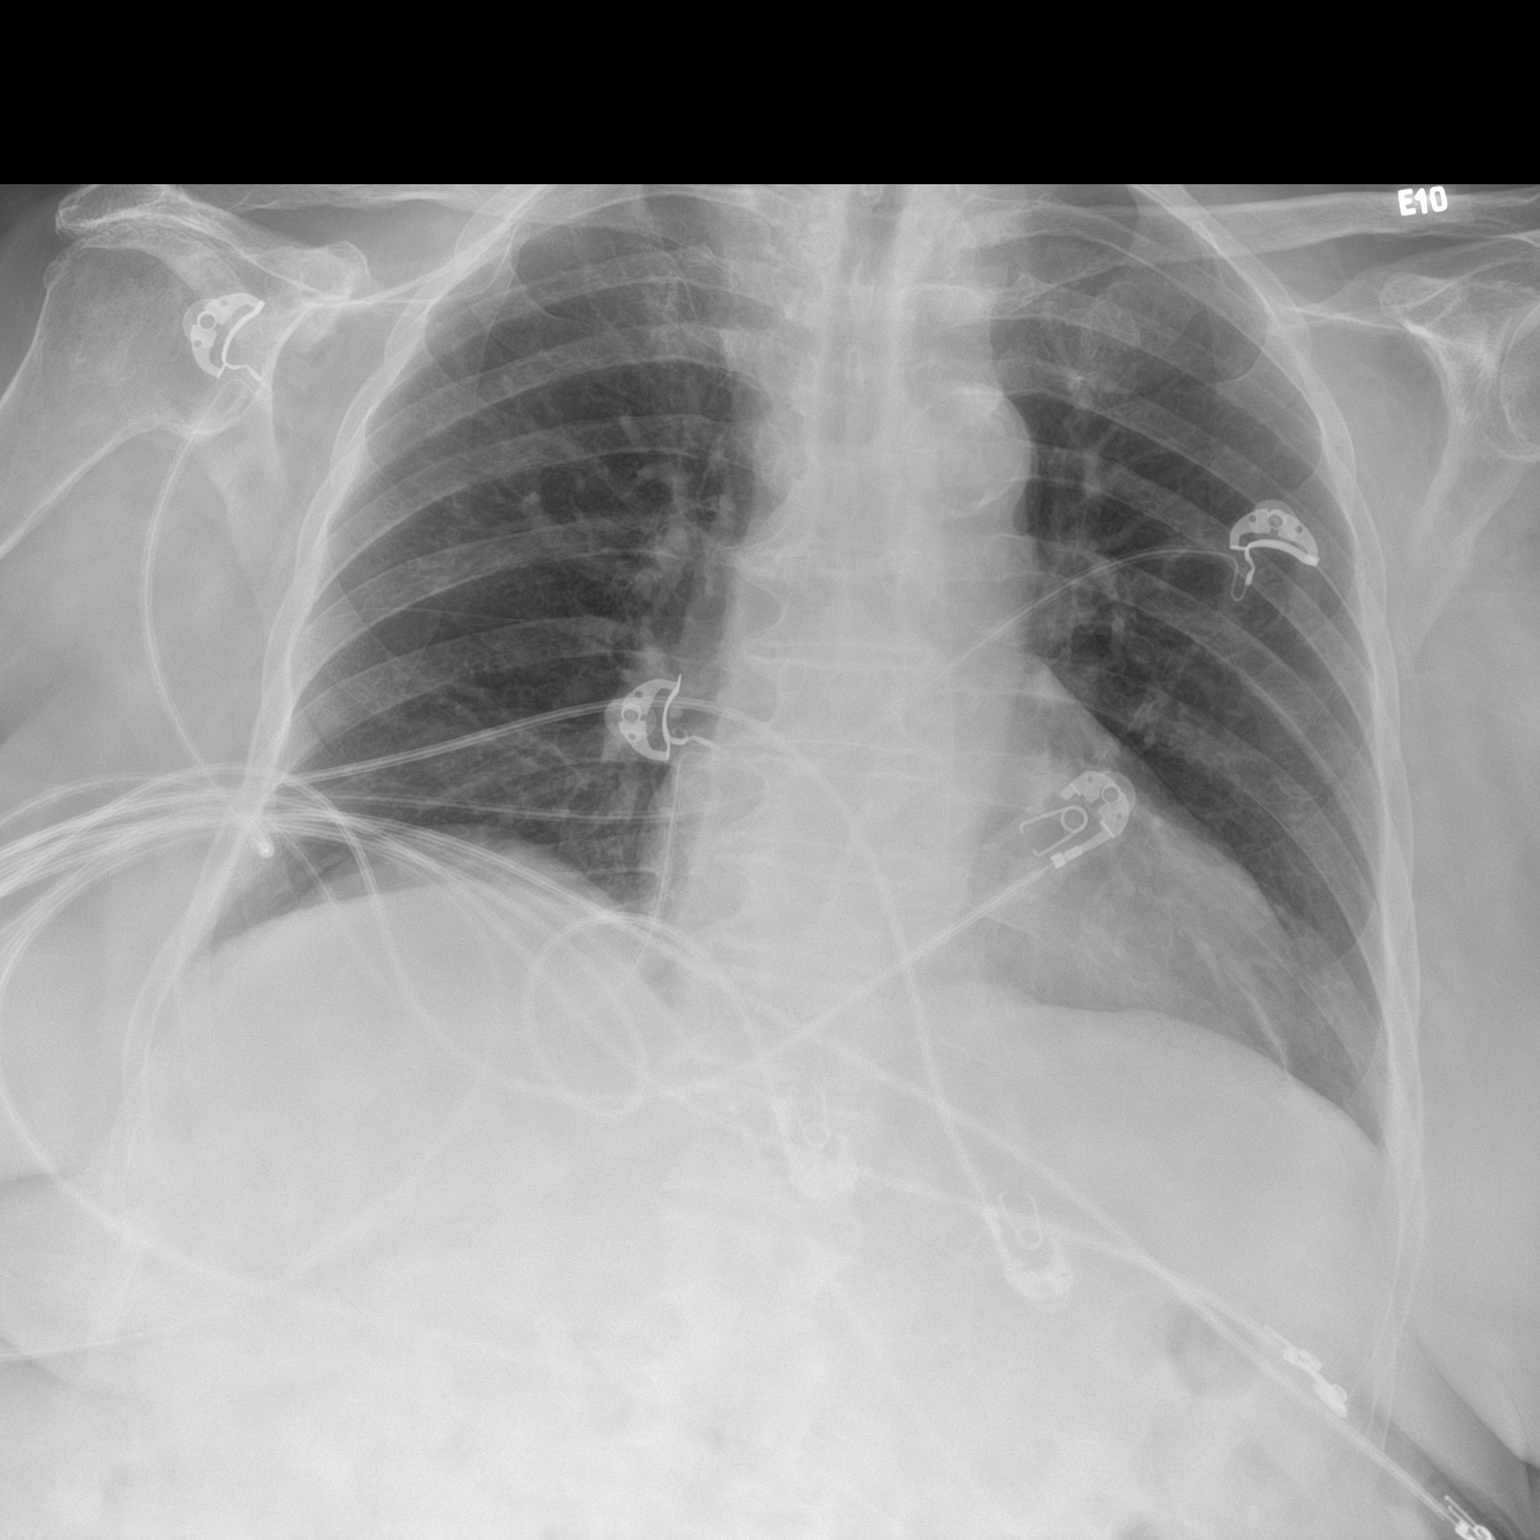

[1 of 1 positions shown; findings below may reference images not displayed]

FINDINGS: Single frontal view of the chest demonstrates an unremarkable
cardiac silhouette. No airspace disease, effusion, or pneumothorax.
No acute bony abnormality.
IMPRESSION: 1. No acute intrathoracic process.

## 2021-11-15 IMAGING — CT CT HEAD W/O CM
4 series · 17 of 47 positions shown, 19 images · non-contrast
Comparison: CT head 02/08/2021

CLINICAL DATA: Fall.  Facial trauma

EXAM:
CT HEAD WITHOUT CONTRAST
CT CERVICAL SPINE WITHOUT CONTRAST
TECHNIQUE: Multidetector CT imaging of the head and cervical spine was
performed following the standard protocol without intravenous
contrast. Multiplanar CT image reconstructions of the cervical spine
were also generated.

[Series 1: head wo · axial · 0.40mm/px · z∈[-191,-66]mm · 7 of 35 slices shown, 9 images]
[im 5/35  brain]
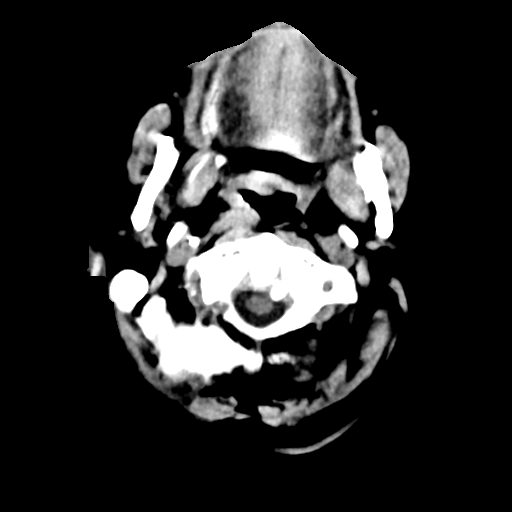
[im 5/35  bone]
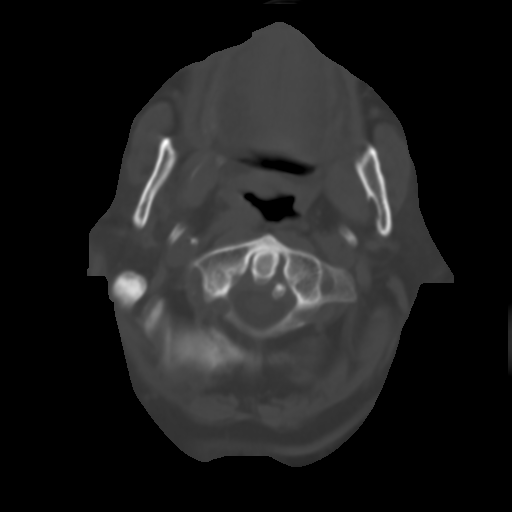
[im 9/35  brain]
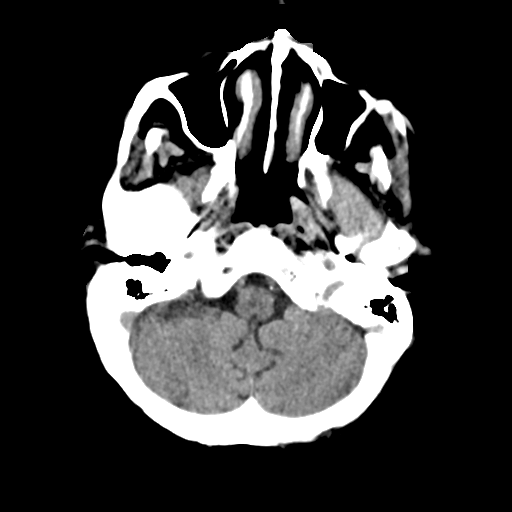
[im 13/35  brain]
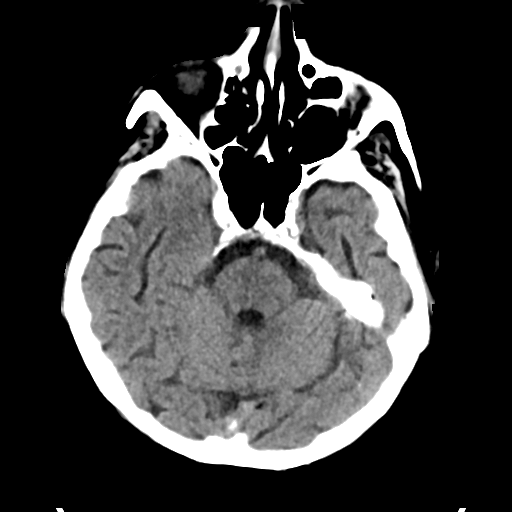
[im 18/35  brain]
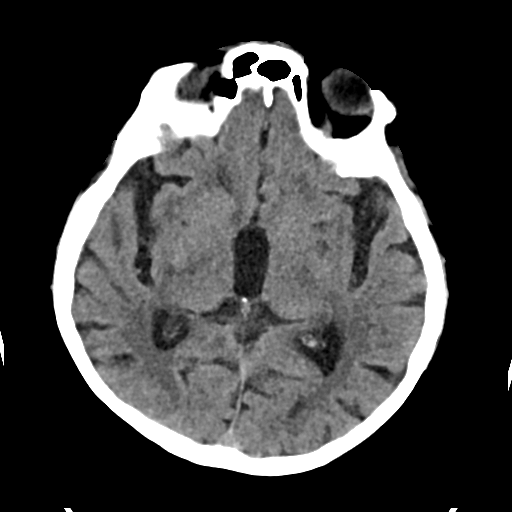
[im 22/35  brain]
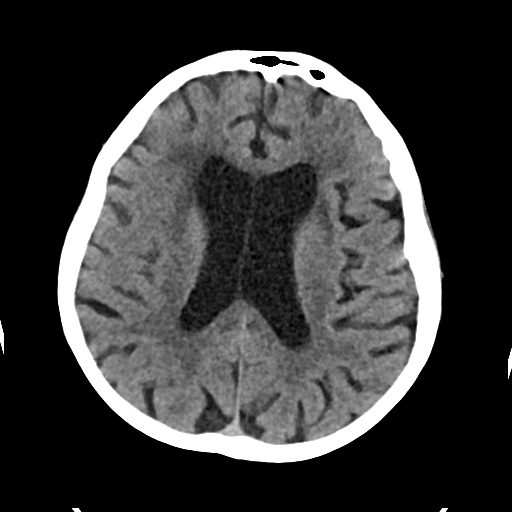
[im 22/35  bone]
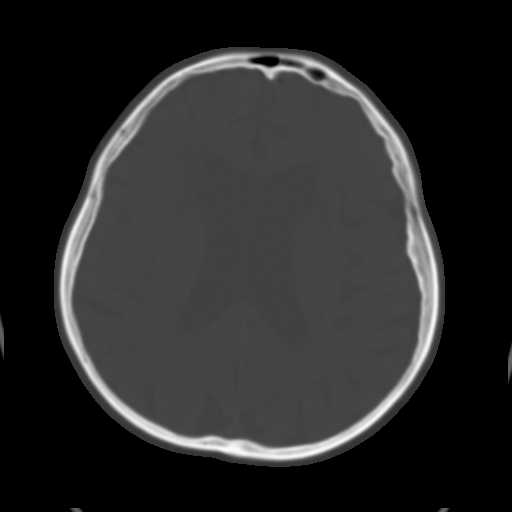
[im 26/35  brain]
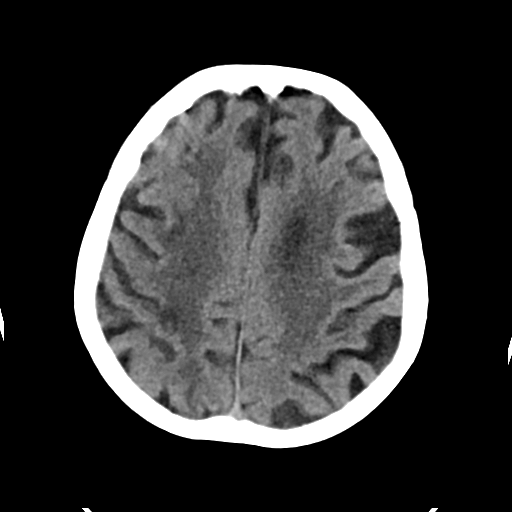
[im 30/35  brain]
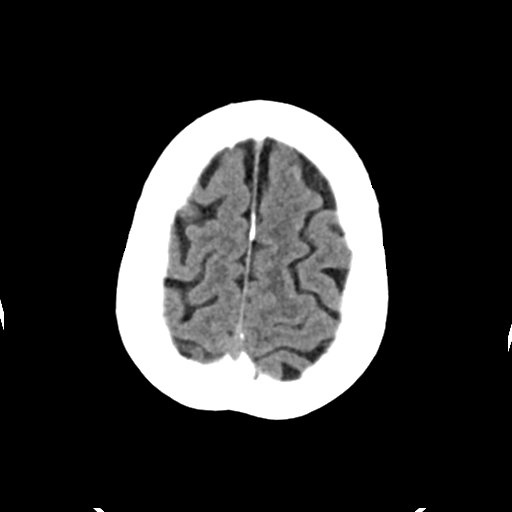

[Series 4: head bone · axial · 0.40mm/px · z∈[-195,-135]mm · 4 of 87 slices shown]
[im 9/87  bone]
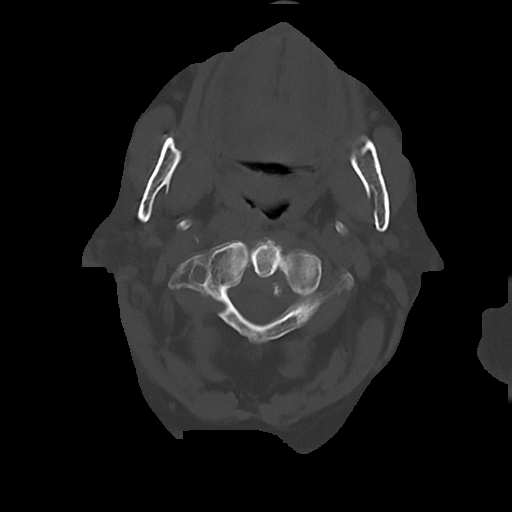
[im 18/87  bone]
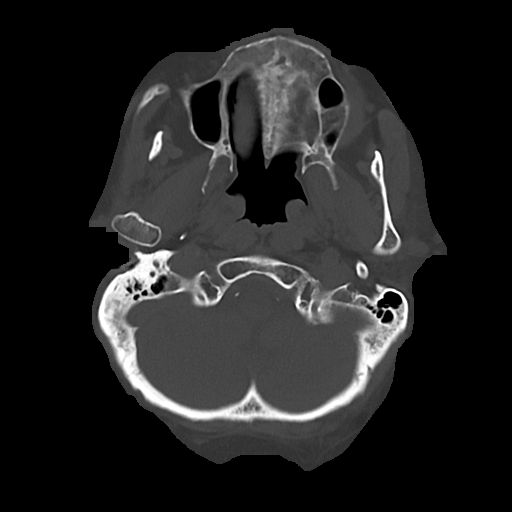
[im 26/87  bone]
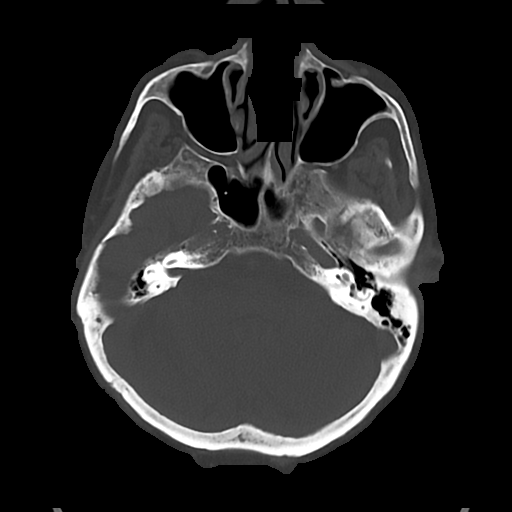
[im 39/87  bone]
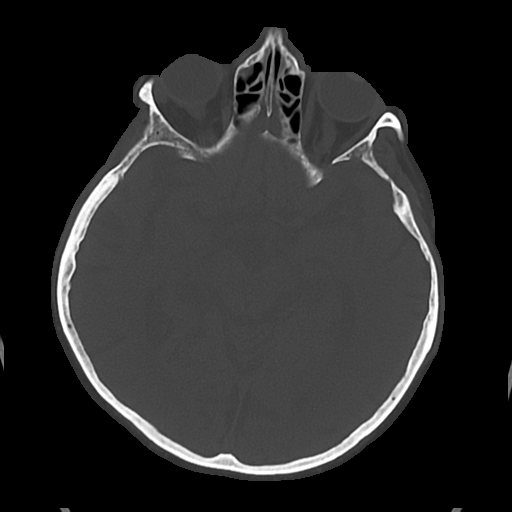

[Series 5: cor soft · coronal · 0.36mm/px · 3 of 71 slices shown]
[im 24/71  brain]
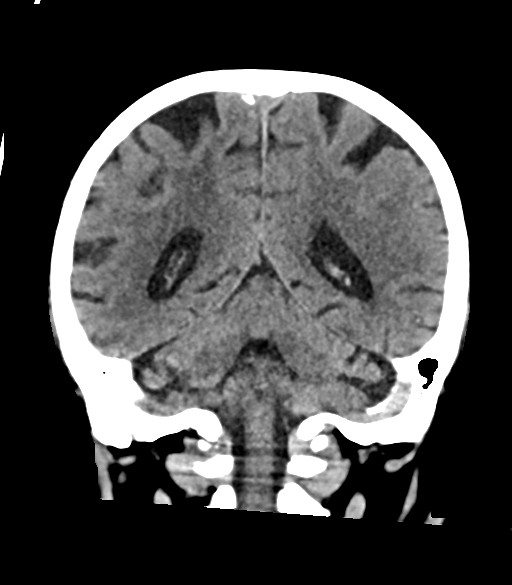
[im 32/71  brain]
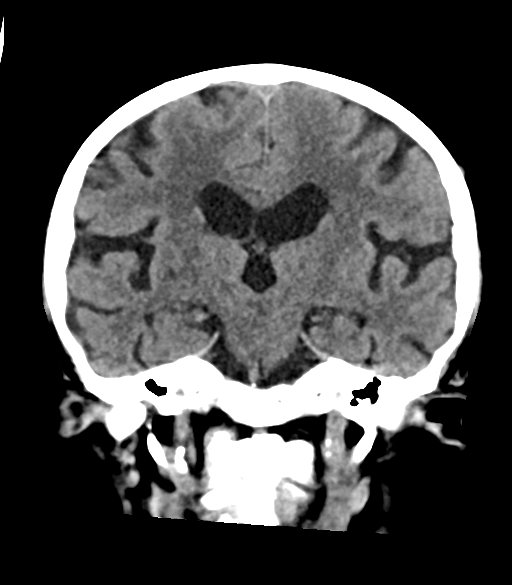
[im 39/71  brain]
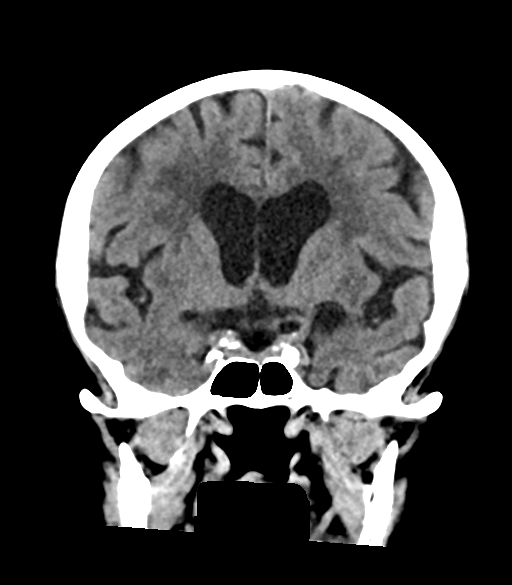

[Series 6: sag soft · sagittal · 0.39mm/px · 3 of 58 slices shown]
[im 20/58  brain]
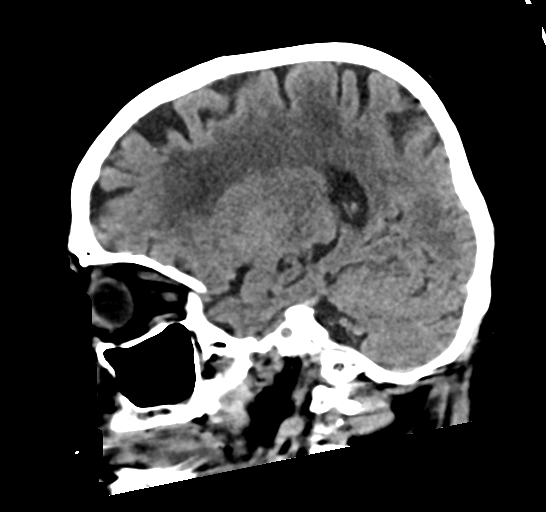
[im 29/58  brain]
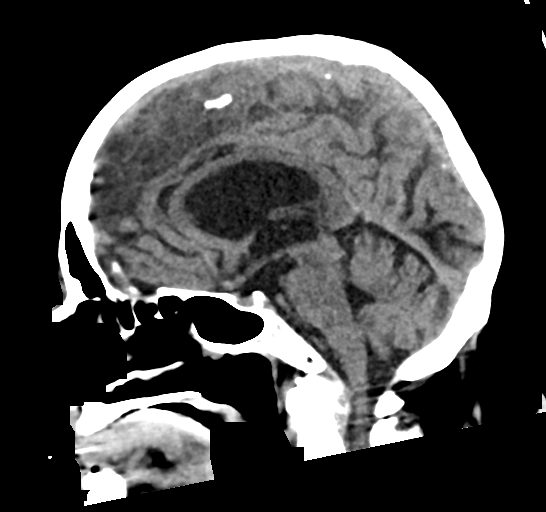
[im 39/58  brain]
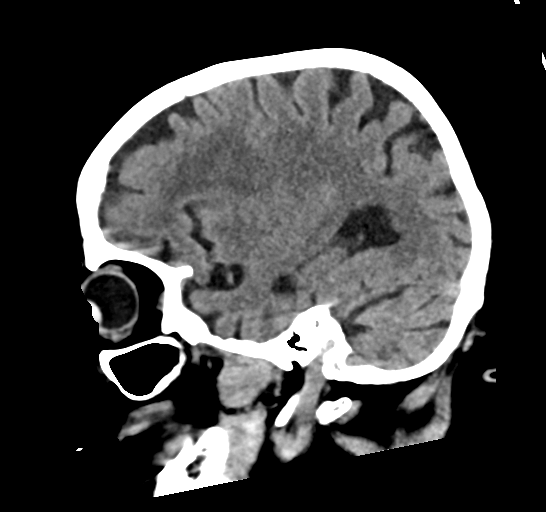

[17 of 47 positions shown; findings below may reference images not displayed]

FINDINGS: CT HEAD FINDINGS

Brain: Mild atrophy. Mild to moderate chronic white matter changes.
Negative for acute infarct, hemorrhage, mass

Vascular: Negative for hyperdense vessel

Skull: Negative

Sinuses/Orbits: Mild mucosal edema paranasal sinuses. Negative orbit

Other: None

CT CERVICAL SPINE FINDINGS

Alignment: Mild anterolisthesis C3-4.

Skull base and vertebrae: Negative for fracture

Soft tissues and spinal canal: Atherosclerotic disease. Right
carotid stent noted.

Disc levels: Disc degeneration and spurring throughout the cervical
spine most prominent C4-5 and C5-6.

Upper chest: Lung apices clear bilaterally.

Other: None
IMPRESSION: 1. No acute intracranial abnormality
2. Negative for cervical spine fracture.
